# Patient Record
Sex: Female | Born: 1955
Health system: Southern US, Community
[De-identification: ages and names within clinical notes are randomized; demographics above are authoritative.]

## PROBLEM LIST (undated history)

## (undated) DIAGNOSIS — M1990 Unspecified osteoarthritis, unspecified site: Secondary | ICD-10-CM

## (undated) DIAGNOSIS — R490 Dysphonia: Secondary | ICD-10-CM

## (undated) DIAGNOSIS — I10 Essential (primary) hypertension: Secondary | ICD-10-CM

## (undated) DIAGNOSIS — C50919 Malignant neoplasm of unspecified site of unspecified female breast: Secondary | ICD-10-CM

## (undated) DIAGNOSIS — E785 Hyperlipidemia, unspecified: Secondary | ICD-10-CM

## (undated) DIAGNOSIS — T7840XA Allergy, unspecified, initial encounter: Secondary | ICD-10-CM

## (undated) HISTORY — DX: Malignant neoplasm of unspecified site of unspecified female breast: C50.919

## (undated) HISTORY — PX: THROAT SURGERY: SHX803

## (undated) HISTORY — PX: FOOT SURGERY: SHX648

## (undated) HISTORY — DX: Hyperlipidemia, unspecified: E78.5

## (undated) HISTORY — DX: Unspecified osteoarthritis, unspecified site: M19.90

## (undated) HISTORY — DX: Allergy, unspecified, initial encounter: T78.40XA

## (undated) HISTORY — PX: COLONOSCOPY: SHX174

---

## 2001-09-13 ENCOUNTER — Other Ambulatory Visit: Admission: RE | Admit: 2001-09-13 | Discharge: 2001-09-13 | Payer: Self-pay | Admitting: *Deleted

## 2001-09-13 ENCOUNTER — Ambulatory Visit (HOSPITAL_COMMUNITY): Admission: RE | Admit: 2001-09-13 | Discharge: 2001-09-13 | Payer: Self-pay | Admitting: *Deleted

## 2001-09-13 ENCOUNTER — Encounter: Admission: RE | Admit: 2001-09-13 | Discharge: 2001-09-13 | Payer: Self-pay | Admitting: *Deleted

## 2001-09-20 ENCOUNTER — Encounter: Admission: RE | Admit: 2001-09-20 | Discharge: 2001-09-20 | Payer: Self-pay | Admitting: Internal Medicine

## 2001-10-04 ENCOUNTER — Encounter: Admission: RE | Admit: 2001-10-04 | Discharge: 2001-10-04 | Payer: Self-pay | Admitting: Internal Medicine

## 2001-10-11 ENCOUNTER — Ambulatory Visit (HOSPITAL_COMMUNITY): Admission: RE | Admit: 2001-10-11 | Discharge: 2001-10-11 | Payer: Self-pay | Admitting: Internal Medicine

## 2002-06-09 ENCOUNTER — Encounter: Payer: Self-pay | Admitting: Emergency Medicine

## 2002-06-09 ENCOUNTER — Emergency Department (HOSPITAL_COMMUNITY): Admission: EM | Admit: 2002-06-09 | Discharge: 2002-06-09 | Payer: Self-pay | Admitting: Emergency Medicine

## 2002-10-09 ENCOUNTER — Other Ambulatory Visit: Admission: RE | Admit: 2002-10-09 | Discharge: 2002-10-09 | Payer: Self-pay | Admitting: Family Medicine

## 2002-10-14 ENCOUNTER — Ambulatory Visit (HOSPITAL_COMMUNITY): Admission: RE | Admit: 2002-10-14 | Discharge: 2002-10-14 | Payer: Self-pay | Admitting: Family Medicine

## 2002-10-21 ENCOUNTER — Encounter: Admission: RE | Admit: 2002-10-21 | Discharge: 2002-11-18 | Payer: Self-pay | Admitting: Internal Medicine

## 2004-07-02 ENCOUNTER — Emergency Department (HOSPITAL_COMMUNITY): Admission: AD | Admit: 2004-07-02 | Discharge: 2004-07-02 | Payer: Self-pay | Admitting: Family Medicine

## 2004-11-09 ENCOUNTER — Other Ambulatory Visit: Admission: RE | Admit: 2004-11-09 | Discharge: 2004-11-09 | Payer: Self-pay | Admitting: Family Medicine

## 2004-11-09 ENCOUNTER — Ambulatory Visit: Payer: Self-pay | Admitting: Family Medicine

## 2004-11-10 ENCOUNTER — Ambulatory Visit: Payer: Self-pay | Admitting: Family Medicine

## 2004-11-23 ENCOUNTER — Ambulatory Visit (HOSPITAL_COMMUNITY): Admission: RE | Admit: 2004-11-23 | Discharge: 2004-11-23 | Payer: Self-pay | Admitting: Internal Medicine

## 2005-05-25 ENCOUNTER — Emergency Department (HOSPITAL_COMMUNITY): Admission: EM | Admit: 2005-05-25 | Discharge: 2005-05-25 | Payer: Self-pay | Admitting: Family Medicine

## 2005-09-17 ENCOUNTER — Emergency Department (HOSPITAL_COMMUNITY): Admission: EM | Admit: 2005-09-17 | Discharge: 2005-09-17 | Payer: Self-pay | Admitting: Family Medicine

## 2006-03-20 ENCOUNTER — Ambulatory Visit: Payer: Self-pay | Admitting: Internal Medicine

## 2006-06-11 ENCOUNTER — Ambulatory Visit: Payer: Self-pay | Admitting: Family Medicine

## 2006-06-11 ENCOUNTER — Encounter (INDEPENDENT_AMBULATORY_CARE_PROVIDER_SITE_OTHER): Payer: Self-pay | Admitting: Family Medicine

## 2006-06-11 ENCOUNTER — Encounter (INDEPENDENT_AMBULATORY_CARE_PROVIDER_SITE_OTHER): Payer: Self-pay | Admitting: *Deleted

## 2006-06-11 LAB — CONVERTED CEMR LAB

## 2006-11-29 ENCOUNTER — Ambulatory Visit: Payer: Self-pay | Admitting: Internal Medicine

## 2007-02-07 ENCOUNTER — Emergency Department (HOSPITAL_COMMUNITY): Admission: EM | Admit: 2007-02-07 | Discharge: 2007-02-07 | Payer: Self-pay | Admitting: Emergency Medicine

## 2007-03-12 ENCOUNTER — Encounter (INDEPENDENT_AMBULATORY_CARE_PROVIDER_SITE_OTHER): Payer: Self-pay | Admitting: Family Medicine

## 2007-03-12 DIAGNOSIS — I10 Essential (primary) hypertension: Secondary | ICD-10-CM | POA: Insufficient documentation

## 2007-03-12 DIAGNOSIS — F17200 Nicotine dependence, unspecified, uncomplicated: Secondary | ICD-10-CM

## 2007-03-12 DIAGNOSIS — E669 Obesity, unspecified: Secondary | ICD-10-CM

## 2007-03-12 DIAGNOSIS — J301 Allergic rhinitis due to pollen: Secondary | ICD-10-CM

## 2007-03-12 DIAGNOSIS — E7800 Pure hypercholesterolemia, unspecified: Secondary | ICD-10-CM

## 2007-03-26 ENCOUNTER — Emergency Department (HOSPITAL_COMMUNITY): Admission: EM | Admit: 2007-03-26 | Discharge: 2007-03-26 | Payer: Self-pay | Admitting: Emergency Medicine

## 2007-06-24 ENCOUNTER — Ambulatory Visit (HOSPITAL_COMMUNITY): Admission: RE | Admit: 2007-06-24 | Discharge: 2007-06-24 | Payer: Self-pay | Admitting: Family Medicine

## 2007-06-26 ENCOUNTER — Ambulatory Visit: Payer: Self-pay | Admitting: Internal Medicine

## 2008-03-31 ENCOUNTER — Emergency Department (HOSPITAL_COMMUNITY): Admission: EM | Admit: 2008-03-31 | Discharge: 2008-03-31 | Payer: Self-pay | Admitting: Family Medicine

## 2008-09-04 ENCOUNTER — Ambulatory Visit: Payer: Self-pay | Admitting: Internal Medicine

## 2008-09-04 ENCOUNTER — Encounter (INDEPENDENT_AMBULATORY_CARE_PROVIDER_SITE_OTHER): Payer: Self-pay | Admitting: Adult Health

## 2008-09-04 LAB — CONVERTED CEMR LAB
ALT: 15 units/L (ref 0–35)
AST: 17 units/L (ref 0–37)
Albumin: 3.9 g/dL (ref 3.5–5.2)
Alkaline Phosphatase: 51 units/L (ref 39–117)
Basophils Absolute: 0 10*3/uL (ref 0.0–0.1)
Basophils Relative: 0 % (ref 0–1)
Hemoglobin: 13.7 g/dL (ref 12.0–15.0)
LDL Cholesterol: 170 mg/dL — ABNORMAL HIGH (ref 0–99)
Lymphocytes Relative: 26 % (ref 12–46)
MCHC: 33.8 g/dL (ref 30.0–36.0)
Monocytes Absolute: 0.4 10*3/uL (ref 0.1–1.0)
Neutro Abs: 3.7 10*3/uL (ref 1.7–7.7)
Neutrophils Relative %: 65 % (ref 43–77)
Platelets: 193 10*3/uL (ref 150–400)
Potassium: 4.3 meq/L (ref 3.5–5.3)
RDW: 14.4 % (ref 11.5–15.5)
Sodium: 140 meq/L (ref 135–145)
TSH: 1.131 microintl units/mL (ref 0.350–4.500)
Total Bilirubin: 0.2 mg/dL — ABNORMAL LOW (ref 0.3–1.2)
Total Protein: 6.5 g/dL (ref 6.0–8.3)
Triglycerides: 194 mg/dL — ABNORMAL HIGH (ref <150)
VLDL: 39 mg/dL (ref 0–40)

## 2008-09-07 ENCOUNTER — Ambulatory Visit: Payer: Self-pay | Admitting: Internal Medicine

## 2008-09-08 ENCOUNTER — Encounter (INDEPENDENT_AMBULATORY_CARE_PROVIDER_SITE_OTHER): Payer: Self-pay | Admitting: Adult Health

## 2008-09-08 LAB — CONVERTED CEMR LAB: Hgb A1c MFr Bld: 7.3 % — ABNORMAL HIGH (ref 4.6–6.1)

## 2008-09-22 ENCOUNTER — Ambulatory Visit (HOSPITAL_COMMUNITY): Admission: RE | Admit: 2008-09-22 | Discharge: 2008-09-22 | Payer: Self-pay | Admitting: Family Medicine

## 2008-10-26 ENCOUNTER — Encounter (INDEPENDENT_AMBULATORY_CARE_PROVIDER_SITE_OTHER): Payer: Self-pay | Admitting: Adult Health

## 2008-10-26 ENCOUNTER — Ambulatory Visit: Payer: Self-pay | Admitting: Internal Medicine

## 2008-10-26 LAB — CONVERTED CEMR LAB
AST: 13 units/L (ref 0–37)
Albumin: 4 g/dL (ref 3.5–5.2)
Alkaline Phosphatase: 48 units/L (ref 39–117)
BUN: 23 mg/dL (ref 6–23)
Calcium: 9.4 mg/dL (ref 8.4–10.5)
Creatinine, Ser: 1.29 mg/dL — ABNORMAL HIGH (ref 0.40–1.20)
GC Probe Amp, Genital: NEGATIVE
Glucose, Bld: 124 mg/dL — ABNORMAL HIGH (ref 70–99)
HDL: 36 mg/dL — ABNORMAL LOW (ref >39)
Potassium: 4.1 meq/L (ref 3.5–5.3)
Total CHOL/HDL Ratio: 5.3
Triglycerides: 202 mg/dL — ABNORMAL HIGH (ref <150)

## 2008-11-30 ENCOUNTER — Ambulatory Visit: Payer: Self-pay | Admitting: Internal Medicine

## 2008-11-30 ENCOUNTER — Encounter (INDEPENDENT_AMBULATORY_CARE_PROVIDER_SITE_OTHER): Payer: Self-pay | Admitting: Adult Health

## 2008-11-30 LAB — CONVERTED CEMR LAB
ALT: 12 units/L (ref 0–35)
Albumin: 3.9 g/dL (ref 3.5–5.2)
CO2: 24 meq/L (ref 19–32)
Calcium: 8.8 mg/dL (ref 8.4–10.5)
Chloride: 110 meq/L (ref 96–112)
Cholesterol: 167 mg/dL (ref 0–200)
Glucose, Bld: 97 mg/dL (ref 70–99)
Sodium: 145 meq/L (ref 135–145)
Total Protein: 6.4 g/dL (ref 6.0–8.3)
Triglycerides: 139 mg/dL (ref <150)

## 2009-01-26 ENCOUNTER — Encounter (INDEPENDENT_AMBULATORY_CARE_PROVIDER_SITE_OTHER): Payer: Self-pay | Admitting: Internal Medicine

## 2009-01-26 ENCOUNTER — Telehealth (INDEPENDENT_AMBULATORY_CARE_PROVIDER_SITE_OTHER): Payer: Self-pay | Admitting: *Deleted

## 2009-01-26 ENCOUNTER — Ambulatory Visit: Payer: Self-pay | Admitting: Internal Medicine

## 2009-01-26 LAB — CONVERTED CEMR LAB: Helicobacter Pylori Antibody-IgG: >8 — ABNORMAL HIGH

## 2009-02-10 ENCOUNTER — Ambulatory Visit: Payer: Self-pay | Admitting: Internal Medicine

## 2009-05-14 ENCOUNTER — Ambulatory Visit: Payer: Self-pay | Admitting: Internal Medicine

## 2009-08-13 ENCOUNTER — Encounter (INDEPENDENT_AMBULATORY_CARE_PROVIDER_SITE_OTHER): Payer: Self-pay | Admitting: Adult Health

## 2009-08-13 ENCOUNTER — Ambulatory Visit: Payer: Self-pay | Admitting: Internal Medicine

## 2009-08-13 LAB — CONVERTED CEMR LAB
ALT: 13 units/L (ref 0–35)
AST: 18 units/L (ref 0–37)
Albumin: 4 g/dL (ref 3.5–5.2)
Calcium: 9 mg/dL (ref 8.4–10.5)
Chloride: 106 meq/L (ref 96–112)
Creatinine, Ser: 1.12 mg/dL (ref 0.40–1.20)
Potassium: 4.2 meq/L (ref 3.5–5.3)
Sodium: 139 meq/L (ref 135–145)
Total CHOL/HDL Ratio: 7.9
Total Protein: 6.4 g/dL (ref 6.0–8.3)
Vit D, 25-Hydroxy: 11 ng/mL — ABNORMAL LOW (ref 30–89)

## 2009-11-12 ENCOUNTER — Ambulatory Visit: Payer: Self-pay | Admitting: Internal Medicine

## 2009-11-12 ENCOUNTER — Encounter (INDEPENDENT_AMBULATORY_CARE_PROVIDER_SITE_OTHER): Payer: Self-pay | Admitting: Adult Health

## 2009-11-12 ENCOUNTER — Other Ambulatory Visit: Admission: RE | Admit: 2009-11-12 | Discharge: 2009-11-12 | Payer: Self-pay | Admitting: Internal Medicine

## 2009-11-12 LAB — CONVERTED CEMR LAB
ALT: 12 units/L (ref 0–35)
CO2: 24 meq/L (ref 19–32)
Cholesterol: 181 mg/dL (ref 0–200)
LDL Cholesterol: 107 mg/dL — ABNORMAL HIGH (ref 0–99)
Potassium: 4.1 meq/L (ref 3.5–5.3)
Sodium: 142 meq/L (ref 135–145)
Total Bilirubin: 0.3 mg/dL (ref 0.3–1.2)
Total Protein: 6.8 g/dL (ref 6.0–8.3)
VLDL: 38 mg/dL (ref 0–40)
Vit D, 25-Hydroxy: 22 ng/mL — ABNORMAL LOW (ref 30–89)

## 2009-12-15 ENCOUNTER — Ambulatory Visit (HOSPITAL_COMMUNITY): Admission: RE | Admit: 2009-12-15 | Discharge: 2009-12-15 | Payer: Self-pay | Admitting: Internal Medicine

## 2009-12-31 ENCOUNTER — Ambulatory Visit: Payer: Self-pay | Admitting: Obstetrics and Gynecology

## 2009-12-31 ENCOUNTER — Other Ambulatory Visit: Admission: RE | Admit: 2009-12-31 | Discharge: 2009-12-31 | Payer: Self-pay | Admitting: Obstetrics & Gynecology

## 2010-01-21 ENCOUNTER — Ambulatory Visit: Payer: Self-pay | Admitting: Obstetrics and Gynecology

## 2010-05-22 ENCOUNTER — Encounter: Payer: Self-pay | Admitting: Internal Medicine

## 2010-06-14 ENCOUNTER — Encounter (INDEPENDENT_AMBULATORY_CARE_PROVIDER_SITE_OTHER): Payer: Self-pay | Admitting: *Deleted

## 2010-06-14 LAB — CONVERTED CEMR LAB
Albumin: 4.2 g/dL (ref 3.5–5.2)
CO2: 25 meq/L (ref 19–32)
Glucose, Bld: 146 mg/dL — ABNORMAL HIGH (ref 70–99)
MCV: 82.2 fL (ref 78.0–100.0)
Microalb, Ur: 0.5 mg/dL (ref 0.00–1.89)
Potassium: 3.9 meq/L (ref 3.5–5.3)
RBC: 5.16 M/uL — ABNORMAL HIGH (ref 3.87–5.11)
Sodium: 140 meq/L (ref 135–145)
TSH: 0.918 microintl units/mL (ref 0.350–4.500)
Total Protein: 6.9 g/dL (ref 6.0–8.3)
WBC: 6 10*3/uL (ref 4.0–10.5)

## 2010-07-08 ENCOUNTER — Ambulatory Visit (INDEPENDENT_AMBULATORY_CARE_PROVIDER_SITE_OTHER): Payer: Medicaid Other | Admitting: Family Medicine

## 2010-07-08 ENCOUNTER — Other Ambulatory Visit: Payer: Self-pay | Admitting: Family Medicine

## 2010-07-08 DIAGNOSIS — Z0142 Encounter for cervical smear to confirm findings of recent normal smear following initial abnormal smear: Secondary | ICD-10-CM

## 2010-07-22 NOTE — Progress Notes (Signed)
NAME:  Stephanie Neal, BURR              ACCOUNT NO.:  [id number]  MEDICAL RECORD NO.:  [id number]           PATIENT TYPE:  A  LOCATION:  [facility]                   FACILITY:  [facility]  PHYSICIAN:  Tinnie Gens, MD        DATE OF BIRTH:  03-Jan-1956  DATE OF SERVICE:  07/08/2010                                 CLINIC NOTE  CHIEF COMPLAINT:  Followup Pap smear.  HISTORY OF PRESENT ILLNESS:  The patient is a 55 year old gravida 8, para 6-0-2-5 who comes in today for history of ASC-H Pap in September of last year.  She underwent colposcopy and the biopsy showed benign endocervical mucus with squamous metaplasia and benign squamocolumnar mucosa with mild acute cervicitis.  She is for followup Pap today.  She is without specific complaint.  PHYSICAL EXAMINATION:  VITAL SIGNS:  As noted in the chart. GENERAL:  She is a well-developed and well-nourished female in no acute distress. GU:  Normal external female genitalia.  BUS normal.  Vagina is pink and rugated.  Cervix is parous without lesion.  Uterus is small, anteverted. No adnexal mass or tenderness.  IMPRESSION:  Postmenopausal female with history of abnormal Pap, status post colposcopy with benign biopsy results.  PLAN:  Follow up Pap smear today.  We will send results to the patient once they become available.          ______________________________ Tinnie Gens, MD    TP/MEDQ  D:  07/08/2010  T:  07/09/2010  Job:  [id number]

## 2010-11-29 ENCOUNTER — Other Ambulatory Visit (HOSPITAL_COMMUNITY): Payer: Self-pay | Admitting: Family Medicine

## 2010-11-29 DIAGNOSIS — Z1231 Encounter for screening mammogram for malignant neoplasm of breast: Secondary | ICD-10-CM

## 2010-12-19 ENCOUNTER — Ambulatory Visit (HOSPITAL_COMMUNITY): Payer: Medicaid Other

## 2010-12-27 ENCOUNTER — Ambulatory Visit (HOSPITAL_COMMUNITY): Payer: Medicaid Other

## 2011-01-05 ENCOUNTER — Emergency Department (HOSPITAL_COMMUNITY)
Admission: EM | Admit: 2011-01-05 | Discharge: 2011-01-05 | Disposition: A | Discharge status: Home / Self Care | Payer: Medicaid Other | Attending: Emergency Medicine | Admitting: Emergency Medicine

## 2011-01-05 ENCOUNTER — Inpatient Hospital Stay (INDEPENDENT_AMBULATORY_CARE_PROVIDER_SITE_OTHER)
Admission: RE | Admit: 2011-01-05 | Discharge: 2011-01-05 | Disposition: A | Discharge status: Home / Self Care | Payer: Medicaid Other | Source: Ambulatory Visit | Attending: Family Medicine | Admitting: Family Medicine

## 2011-01-05 DIAGNOSIS — I10 Essential (primary) hypertension: Secondary | ICD-10-CM | POA: Insufficient documentation

## 2011-01-05 DIAGNOSIS — H538 Other visual disturbances: Secondary | ICD-10-CM | POA: Insufficient documentation

## 2011-01-05 DIAGNOSIS — R3589 Other polyuria: Secondary | ICD-10-CM | POA: Insufficient documentation

## 2011-01-05 DIAGNOSIS — R631 Polydipsia: Secondary | ICD-10-CM | POA: Insufficient documentation

## 2011-01-05 DIAGNOSIS — R110 Nausea: Secondary | ICD-10-CM | POA: Insufficient documentation

## 2011-01-05 DIAGNOSIS — E119 Type 2 diabetes mellitus without complications: Secondary | ICD-10-CM | POA: Insufficient documentation

## 2011-01-05 DIAGNOSIS — Z79899 Other long term (current) drug therapy: Secondary | ICD-10-CM | POA: Insufficient documentation

## 2011-01-05 DIAGNOSIS — R42 Dizziness and giddiness: Secondary | ICD-10-CM | POA: Insufficient documentation

## 2011-01-05 DIAGNOSIS — R5381 Other malaise: Secondary | ICD-10-CM | POA: Insufficient documentation

## 2011-01-05 DIAGNOSIS — R7989 Other specified abnormal findings of blood chemistry: Secondary | ICD-10-CM

## 2011-01-05 DIAGNOSIS — R358 Other polyuria: Secondary | ICD-10-CM | POA: Insufficient documentation

## 2011-01-05 LAB — POCT I-STAT 3, VENOUS BLOOD GAS (G3P V)
Acid-Base Excess: 1 mmol/L (ref 0.0–2.0)
Bicarbonate: 28 mEq/L — ABNORMAL HIGH (ref 20.0–24.0)
O2 Saturation: 50 %
TCO2: 30 mmol/L (ref 0–100)
pCO2, Ven: 51.3 mmHg — ABNORMAL HIGH (ref 45.0–50.0)
pH, Ven: 7.345 — ABNORMAL HIGH (ref 7.250–7.300)
pO2, Ven: 29 mmHg — CL (ref 30.0–45.0)

## 2011-01-05 LAB — GLUCOSE, CAPILLARY
Glucose-Capillary: 585 mg/dL (ref 70–99)
Glucose-Capillary: 396 mg/dL — ABNORMAL HIGH (ref 70–99)
Glucose-Capillary: 321 mg/dL — ABNORMAL HIGH (ref 70–99)
Glucose-Capillary: 340 mg/dL — ABNORMAL HIGH (ref 70–99)
Glucose-Capillary: 60 mg/dL — ABNORMAL LOW (ref 70–99)
Glucose-Capillary: 144 mg/dL — ABNORMAL HIGH (ref 70–99)

## 2011-01-05 LAB — POCT I-STAT, CHEM 8
BUN: 24 mg/dL — ABNORMAL HIGH (ref 6–23)
Calcium, Ion: 1.2 mmol/L (ref 1.12–1.32)
Calcium, Ion: 1.09 mmol/L — ABNORMAL LOW (ref 1.12–1.32)
Chloride: 94 mEq/L — ABNORMAL LOW (ref 96–112)
Creatinine, Ser: 1.3 mg/dL — ABNORMAL HIGH (ref 0.50–1.10)
Glucose, Bld: >700 mg/dL (ref 70–99)
Glucose, Bld: >700 mg/dL (ref 70–99)
HCT: 45 % (ref 36.0–46.0)
HCT: 44 % (ref 36.0–46.0)
Hemoglobin: 15.3 g/dL — ABNORMAL HIGH (ref 12.0–15.0)
Hemoglobin: 15 g/dL (ref 12.0–15.0)
Potassium: 4.7 meq/L (ref 3.5–5.1)
Sodium: 128 meq/L — ABNORMAL LOW (ref 135–145)
TCO2: 26 mmol/L (ref 0–100)

## 2011-01-05 LAB — POCT URINALYSIS DIP (DEVICE)
Ketones, ur: NEGATIVE mg/dL
Leukocytes, UA: NEGATIVE
Nitrite: NEGATIVE
Protein, ur: NEGATIVE mg/dL
pH: 5 (ref 5.0–8.0)

## 2011-01-05 LAB — CBC
HCT: 39 % (ref 36.0–46.0)
Hemoglobin: 13.9 g/dL (ref 12.0–15.0)
MCH: 27.6 pg (ref 26.0–34.0)
MCV: 77.5 fL — ABNORMAL LOW (ref 78.0–100.0)
Platelets: 195 10*3/uL (ref 150–400)
RBC: 5.03 MIL/uL (ref 3.87–5.11)
WBC: 6 10*3/uL (ref 4.0–10.5)

## 2011-01-05 LAB — URINE MICROSCOPIC-ADD ON

## 2011-01-05 LAB — URINALYSIS, ROUTINE W REFLEX MICROSCOPIC
Glucose, UA: >1000 mg/dL — AB
Leukocytes, UA: NEGATIVE
Nitrite: NEGATIVE
Specific Gravity, Urine: 1.029 (ref 1.005–1.030)
pH: 5.5 (ref 5.0–8.0)

## 2011-01-05 LAB — DIFFERENTIAL
Basophils Absolute: 0 10*3/uL (ref 0.0–0.1)
Basophils Relative: 0 % (ref 0–1)
Eosinophils Absolute: 0.1 10*3/uL (ref 0.0–0.7)
Eosinophils Relative: 2 % (ref 0–5)
Monocytes Absolute: 0.3 10*3/uL (ref 0.1–1.0)

## 2011-01-05 LAB — BASIC METABOLIC PANEL
CO2: 27 mEq/L (ref 19–32)
Calcium: 9.8 mg/dL (ref 8.4–10.5)
Chloride: 90 mEq/L — ABNORMAL LOW (ref 96–112)
Creatinine, Ser: 1.21 mg/dL — ABNORMAL HIGH (ref 0.50–1.10)
Glucose, Bld: 696 mg/dL (ref 70–99)

## 2011-01-06 ENCOUNTER — Ambulatory Visit (HOSPITAL_COMMUNITY): Payer: Medicaid Other | Attending: Family Medicine

## 2011-05-13 ENCOUNTER — Emergency Department (INDEPENDENT_AMBULATORY_CARE_PROVIDER_SITE_OTHER): Payer: Medicaid Other

## 2011-05-13 ENCOUNTER — Emergency Department (INDEPENDENT_AMBULATORY_CARE_PROVIDER_SITE_OTHER)
Admission: EM | Admit: 2011-05-13 | Discharge: 2011-05-13 | Disposition: A | Discharge status: Home / Self Care | Payer: Medicaid Other | Source: Home / Self Care | Attending: Family Medicine | Admitting: Family Medicine

## 2011-05-13 ENCOUNTER — Encounter (HOSPITAL_COMMUNITY): Payer: Self-pay | Admitting: Emergency Medicine

## 2011-05-13 DIAGNOSIS — R109 Unspecified abdominal pain: Secondary | ICD-10-CM

## 2011-05-13 HISTORY — DX: Essential (primary) hypertension: I10

## 2011-05-13 LAB — POCT URINALYSIS DIP (DEVICE)
Bilirubin Urine: NEGATIVE
Glucose, UA: NEGATIVE mg/dL
Nitrite: NEGATIVE
Urobilinogen, UA: 0.2 mg/dL (ref 0.0–1.0)
pH: 6.5 (ref 5.0–8.0)

## 2011-05-13 MED ORDER — HYDROCODONE-ACETAMINOPHEN 5-500 MG PO TABS: 1.0000 | ORAL_TABLET | Freq: Four times a day (QID) | ORAL | Status: AC | PRN

## 2011-05-13 MED ORDER — CIPROFLOXACIN HCL 500 MG PO TABS: 500.0000 mg | ORAL_TABLET | Freq: Two times a day (BID) | ORAL | Status: AC

## 2011-05-13 MED ORDER — TAMSULOSIN HCL 0.4 MG PO CAPS: 0.4000 mg | ORAL_CAPSULE | ORAL | Status: DC

## 2011-05-13 MED ORDER — HYDROCODONE-ACETAMINOPHEN 5-325 MG PO TABS
ORAL_TABLET | ORAL | Status: AC
  Filled 2011-05-13: qty 1

## 2011-05-13 MED ORDER — HYDROCODONE-ACETAMINOPHEN 5-325 MG PO TABS
1.0000 | ORAL_TABLET | Freq: Once | ORAL | Status: AC
  Administered 2011-05-13: 1 via ORAL

## 2011-05-13 NOTE — ED Provider Notes (Signed)
History     CSN: [id number]  Arrival date & time 05/13/11  1732   First MD Initiated Contact with Patient 05/13/11 1844      Chief Complaint  Patient presents with  . Abdominal Pain  . Urinary Tract Infection    (Consider location/radiation/quality/duration/timing/severity/associated sxs/prior treatment) HPI Comments: 56 y/o smoker female with history of diabetes and hypertension here c/o right flank pain for 3 days. Has noted increase urinary frequency but no burning on urination and has not seen blood in her urine. Pain worse with movement denies constipation had normal bowel movement yesterday with no blood. She thinks her pain although no going away is somewhat better. Has been able to keep solids and fluids today and denies, nausea, vomiting or diarrhea. No fever or chills, no vaginal discharge.   Past Medical History  Diagnosis Date  . Hypertension   . Diabetes mellitus     History reviewed. No pertinent past surgical history.  No family history on file.  History  Substance Use Topics  . Smoking status: Current Everyday Smoker  . Smokeless tobacco: Not on file  . Alcohol Use: No    OB History    Grav Para Term Preterm Abortions TAB SAB Ect Mult Living                  Review of Systems  Constitutional: Negative for fever, chills, diaphoresis and appetite change.  HENT: Negative for congestion and sore throat.   Respiratory: Negative for shortness of breath.   Cardiovascular: Negative for chest pain and leg swelling.  Gastrointestinal: Positive for abdominal pain. Negative for nausea, vomiting, diarrhea, constipation, blood in stool and abdominal distention.  Genitourinary: Positive for frequency and flank pain. Negative for dysuria, urgency, vaginal bleeding and vaginal discharge.  Skin: Negative for rash.  Neurological: Negative for dizziness and headaches.    Allergies  Penicillins  Home Medications   Current Outpatient Rx  Name Route Sig Dispense  Refill  . INSULIN GLARGINE 100 UNIT/ML [place] SOLN Subcutaneous Inject 40 Units into the skin at bedtime.    Marland Kitchen LISINOPRIL-HYDROCHLOROTHIAZIDE 10-12.5 MG PO TABS Oral Take 1 tablet by mouth daily.    Marland Kitchen METFORMIN HCL 1000 MG PO TABS Oral Take 1,000 mg by mouth 2 (two) times daily with a meal.    . SIMVASTATIN 40 MG PO TABS Oral Take 40 mg by mouth every evening.    Marland Kitchen CIPROFLOXACIN HCL 500 MG PO TABS Oral Take 1 tablet (500 mg total) by mouth every 12 (twelve) hours. 10 tablet 0  . HYDROCODONE-ACETAMINOPHEN 5-500 MG PO TABS Oral Take 1-2 tablets by mouth every 6 (six) hours as needed for pain. 15 tablet 0  . TAMSULOSIN HCL 0.4 MG PO CAPS Oral Take 1 capsule (0.4 mg total) by mouth 1 day or 1 dose. 7 capsule 0    BP 126/66  Pulse 65  Temp(Src) 98.3 F (36.8 C) (Oral)  Resp 18  SpO2 97%  Physical Exam  Nursing note and vitals reviewed. Constitutional: She is oriented to person, place, and time. She appears well-developed and well-nourished. No distress.       Uncomfortable with movement but able to transfer by self from chair to bed and vice versa.  HENT:  Mouth/Throat: Oropharynx is clear and moist. No oropharyngeal exudate.  Eyes: Conjunctivae and EOM are normal. Pupils are equal, round, and reactive to light. No scleral icterus.  Neck: Neck supple.  Cardiovascular: Normal heart sounds.   Pulmonary/Chest: Breath sounds normal.  Abdominal:  Soft. She exhibits no distension and no mass. There is tenderness. There is no rebound and no guarding.       Right flank and RLQ tenderness. With no signs of peritoneal irritation. No CVT.  Lymphadenopathy:    She has no cervical adenopathy.  Neurological: She is alert and oriented to person, place, and time.  Skin: No rash noted.    ED Course  Procedures (including critical care time)  Labs Reviewed  POCT URINALYSIS DIP (DEVICE) - Abnormal; Notable for the following:    Hgb urine dipstick TRACE (*)    Leukocytes, UA TRACE (*) Biochemical Testing  Only. Please order routine urinalysis from main lab if confirmatory testing is needed.   All other components within normal limits  LAB REPORT - SCANNED  POCT URINALYSIS DIPSTICK   Dg Abd 1 View  05/13/2011  *RADIOLOGY REPORT*  Clinical Data: 56 year old female with right flank pain times 3 days.  ABDOMEN - 1 VIEW  Comparison: None.  Findings: Visualized bowel gas pattern is nonobstructed.  There are numerous calcific densities in the pelvis.  Most of these are phleboliths.  It is difficult to exclude a distal right ureteral calculus.  The right renal contour is fairly well seen and no right nephrolithiasis is identified.  The left renal contour is largely obscured by stool. No acute osseous abnormality identified.  IMPRESSION: Numerous pelvic phleboliths.  Difficult to excluded distal right ureteral calculus by plain radiographs.  No right nephrolithiasis is evident.  Original Report Authenticated By: Harley Hallmark, M.D.     1. Right flank pain       MDM  56 y/o postmenopausal diabetic female with right flank pain and trace Hg and LE in POC urine. Concerning for urolithiasis. Pt. Decline transfer to the ED, stating she has not felt too sick and is feeling better and needs to help her son with something at home. No obvious big stone on X-rays and no clinical symptoms or findings suggesting current urinary obstructive disease or peritonitis. Explained the risks of delaying further diagnostic work up. Treated empirically with flomax, vicodin, cipro and recommendations to return to the emergency department if new symptoms like fever, nausea or vomiting pain. Referral contact for urology outpatient follow up also provided.        Sharin Grave, MD 05/14/11 1142

## 2011-05-13 NOTE — ED Notes (Signed)
PT HEREWITH RIGHT FLANK PAIN X 3 DYS WITH INTERMITT SHARP,ACHY PAINS.PT ALSO C/O FREQ URINATION BUT DENIES PRESSURE.NO VOMITING,FEVER,CHILLS.

## 2012-01-16 ENCOUNTER — Emergency Department (HOSPITAL_COMMUNITY)
Admission: EM | Admit: 2012-01-16 | Discharge: 2012-01-17 | Disposition: A | Discharge status: Home / Self Care | Payer: Medicaid Other | Attending: Emergency Medicine | Admitting: Emergency Medicine

## 2012-01-16 ENCOUNTER — Encounter (HOSPITAL_COMMUNITY): Payer: Self-pay | Admitting: *Deleted

## 2012-01-16 DIAGNOSIS — E119 Type 2 diabetes mellitus without complications: Secondary | ICD-10-CM | POA: Insufficient documentation

## 2012-01-16 DIAGNOSIS — T148XXA Other injury of unspecified body region, initial encounter: Secondary | ICD-10-CM

## 2012-01-16 DIAGNOSIS — Y92009 Unspecified place in unspecified non-institutional (private) residence as the place of occurrence of the external cause: Secondary | ICD-10-CM | POA: Insufficient documentation

## 2012-01-16 DIAGNOSIS — Z794 Long term (current) use of insulin: Secondary | ICD-10-CM | POA: Insufficient documentation

## 2012-01-16 DIAGNOSIS — S1093XA Contusion of unspecified part of neck, initial encounter: Secondary | ICD-10-CM | POA: Insufficient documentation

## 2012-01-16 DIAGNOSIS — S0003XA Contusion of scalp, initial encounter: Secondary | ICD-10-CM | POA: Insufficient documentation

## 2012-01-16 DIAGNOSIS — I10 Essential (primary) hypertension: Secondary | ICD-10-CM | POA: Insufficient documentation

## 2012-01-16 DIAGNOSIS — W1809XA Striking against other object with subsequent fall, initial encounter: Secondary | ICD-10-CM | POA: Insufficient documentation

## 2012-01-16 DIAGNOSIS — W19XXXA Unspecified fall, initial encounter: Secondary | ICD-10-CM
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
## 2012-01-17 ENCOUNTER — Emergency Department (HOSPITAL_COMMUNITY): Payer: Medicaid Other

## 2012-01-17 MED ORDER — IBUPROFEN 800 MG PO TABS
800.0000 mg | ORAL_TABLET | Freq: Once | ORAL | Status: AC
  Administered 2012-01-17: 800 mg via ORAL
  Filled 2012-01-17: qty 1

## 2012-01-17 NOTE — ED Provider Notes (Signed)
Medical screening examination/treatment/procedure(s) were performed by non-physician practitioner and as supervising physician I was immediately available for consultation/collaboration.   Zaina Jenkin, MD 01/17/12 0717 
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
## 2012-02-27 ENCOUNTER — Other Ambulatory Visit (HOSPITAL_COMMUNITY): Payer: Self-pay | Admitting: Internal Medicine

## 2012-02-27 DIAGNOSIS — Z1231 Encounter for screening mammogram for malignant neoplasm of breast: Secondary | ICD-10-CM

## 2012-03-18 ENCOUNTER — Ambulatory Visit (HOSPITAL_COMMUNITY): Payer: Medicaid Other | Attending: Internal Medicine

## 2012-04-16 ENCOUNTER — Ambulatory Visit (HOSPITAL_COMMUNITY): Admission: RE | Admit: 2012-04-16 | Payer: Medicaid Other | Source: Ambulatory Visit

## 2012-08-08 ENCOUNTER — Encounter (HOSPITAL_COMMUNITY): Payer: Self-pay | Admitting: *Deleted

## 2012-08-08 ENCOUNTER — Emergency Department (HOSPITAL_COMMUNITY)
Admission: EM | Admit: 2012-08-08 | Discharge: 2012-08-08 | Disposition: A | Discharge status: Home / Self Care | Payer: Medicaid Other | Source: Home / Self Care | Attending: Emergency Medicine | Admitting: Emergency Medicine

## 2012-08-08 DIAGNOSIS — K30 Functional dyspepsia: Secondary | ICD-10-CM

## 2012-08-08 DIAGNOSIS — R109 Unspecified abdominal pain: Secondary | ICD-10-CM

## 2012-08-08 DIAGNOSIS — K3189 Other diseases of stomach and duodenum: Secondary | ICD-10-CM

## 2012-08-08 LAB — POCT I-STAT, CHEM 8
Calcium, Ion: 1.2 mmol/L (ref 1.12–1.23)
Glucose, Bld: 86 mg/dL (ref 70–99)
HCT: 41 % (ref 36.0–46.0)
Hemoglobin: 13.9 g/dL (ref 12.0–15.0)
Potassium: 4 mEq/L (ref 3.5–5.1)

## 2012-08-08 LAB — POCT URINALYSIS DIP (DEVICE)
Glucose, UA: NEGATIVE mg/dL
Leukocytes, UA: NEGATIVE
Nitrite: NEGATIVE
Protein, ur: NEGATIVE mg/dL
Urobilinogen, UA: 0.2 mg/dL (ref 0.0–1.0)

## 2012-08-08 MED ORDER — TRAMADOL HCL 50 MG PO TABS: 50.0000 mg | ORAL_TABLET | Freq: Four times a day (QID) | ORAL | Status: DC | PRN

## 2012-08-08 MED ORDER — OMEPRAZOLE 20 MG PO CPDR: 20.0000 mg | DELAYED_RELEASE_CAPSULE | Freq: Every day | ORAL | Status: DC

## 2012-08-08 NOTE — ED Notes (Signed)
Pt  Reports  l  Side  Pain  With  Frequent  Urination     Symptoms  X  1  Week     Pt  Also  Reports  Some  Indigestion     Pt   denys  Any   Vomiting  Or  Diarrhea    Appears    In no  Severe  Distress

## 2012-08-08 NOTE — ED Provider Notes (Signed)
History     CSN: [id number]  Arrival date & time 08/08/12  1248   First MD Initiated Contact with Patient 08/08/12 1338      Chief Complaint  Patient presents with  . Flank Pain    (Consider location/radiation/quality/duration/timing/severity/associated sxs/prior treatment) Patient is a 57 y.o. female presenting with flank pain.  Flank Pain This is a new problem. The current episode started more than 1 week ago. The problem occurs constantly. The problem has not changed since onset.Associated symptoms include abdominal pain. The symptoms are aggravated by bending.    Past Medical History  Diagnosis Date  . Hypertension   . Diabetes mellitus     History reviewed. No pertinent past surgical history.  History reviewed. No pertinent family history.  History  Substance Use Topics  . Smoking status: Current Every Day Smoker  . Smokeless tobacco: Not on file  . Alcohol Use: No    OB History   Grav Para Term Preterm Abortions TAB SAB Ect Mult Living                  Review of Systems  Gastrointestinal: Positive for abdominal pain.  Genitourinary: Positive for frequency and flank pain.  All other systems reviewed and are negative.    Allergies  Penicillins  Home Medications   Current Outpatient Rx  Name  Route  Sig  Dispense  Refill  . ibuprofen (ADVIL,MOTRIN) 200 MG tablet   Oral   Take 800 mg by mouth every 6 (six) hours as needed. For pain         . insulin glargine (LANTUS) 100 UNIT/ML injection   Subcutaneous   Inject 40 Units into the skin at bedtime.         Marland Kitchen lisinopril-hydrochlorothiazide (PRINZIDE,ZESTORETIC) 10-12.5 MG per tablet   Oral   Take 1 tablet by mouth daily.         . metFORMIN (GLUCOPHAGE) 1000 MG tablet   Oral   Take 1,000 mg by mouth 2 (two) times daily with a meal.         . omeprazole (PRILOSEC) 20 MG capsule   Oral   Take 1 capsule (20 mg total) by mouth daily.   20 capsule   0     Dispense as written.   .  traMADol (ULTRAM) 50 MG tablet   Oral   Take 1 tablet (50 mg total) by mouth every 6 (six) hours as needed for pain.   15 tablet   0     BP 140/72  Pulse 72  Temp(Src) 98.6 F (37 C) (Oral)  Resp 18  SpO2 100%  Physical Exam  Constitutional: She is oriented to person, place, and time. She appears well-developed and well-nourished.  HENT:  Head: Normocephalic.  Eyes: Pupils are equal, round, and reactive to light.  Cardiovascular: Normal rate and regular rhythm.   Pulmonary/Chest: Effort normal and breath sounds normal.  Abdominal: Soft. There is no tenderness.  Musculoskeletal: Normal range of motion.  Neurological: She is alert and oriented to person, place, and time.  Skin: Skin is warm and dry.    ED Course  Procedures (including critical care time)  Labs Reviewed  POCT I-STAT, CHEM 8  POCT URINALYSIS DIP (DEVICE)   No results found.   1. Right flank pain   2. Functional dyspepsia       MDM          Jani Files, MD 08/28/12 [phone]

## 2012-09-04 ENCOUNTER — Ambulatory Visit
Admission: RE | Admit: 2012-09-04 | Discharge: 2012-09-04 | Disposition: A | Discharge status: Home / Self Care | Payer: Medicaid Other | Source: Ambulatory Visit | Attending: Internal Medicine | Admitting: Internal Medicine

## 2012-09-04 DIAGNOSIS — Z1231 Encounter for screening mammogram for malignant neoplasm of breast: Secondary | ICD-10-CM

## 2013-09-01 ENCOUNTER — Encounter: Payer: Self-pay | Admitting: Internal Medicine

## 2013-09-01 ENCOUNTER — Ambulatory Visit: Payer: Medicaid Other | Attending: Internal Medicine | Admitting: Internal Medicine

## 2013-09-01 VITALS — BP 121/74 | HR 91 | Temp 98.4°F | Resp 12 | Ht 70.5 in | Wt 193.6 lb

## 2013-09-01 DIAGNOSIS — E785 Hyperlipidemia, unspecified: Secondary | ICD-10-CM | POA: Insufficient documentation

## 2013-09-01 DIAGNOSIS — F17200 Nicotine dependence, unspecified, uncomplicated: Secondary | ICD-10-CM

## 2013-09-01 DIAGNOSIS — E119 Type 2 diabetes mellitus without complications: Secondary | ICD-10-CM | POA: Diagnosis not present

## 2013-09-01 DIAGNOSIS — Z79899 Other long term (current) drug therapy: Secondary | ICD-10-CM | POA: Insufficient documentation

## 2013-09-01 DIAGNOSIS — I10 Essential (primary) hypertension: Secondary | ICD-10-CM

## 2013-09-01 DIAGNOSIS — G609 Hereditary and idiopathic neuropathy, unspecified: Secondary | ICD-10-CM | POA: Insufficient documentation

## 2013-09-01 LAB — POCT GLYCOSYLATED HEMOGLOBIN (HGB A1C): HEMOGLOBIN A1C: 6.8

## 2013-09-01 LAB — GLUCOSE, POCT (MANUAL RESULT ENTRY): POC Glucose: 155 mg/dl — AB (ref 70–99)

## 2013-09-01 NOTE — Patient Instructions (Addendum)
Diabetes and Foot Care Diabetes may cause you to have problems because of poor blood supply (circulation) to your feet and legs. This may cause the skin on your feet to become thinner, break easier, and heal more slowly. Your skin may become dry, and the skin may peel and crack. You may also have nerve damage in your legs and feet causing decreased feeling in them. You may not notice minor injuries to your feet that could lead to infections or more serious problems. Taking care of your feet is one of the most important things you can do for yourself.  HOME CARE INSTRUCTIONS  Wear shoes at all times, even in the house. Do not go barefoot. Bare feet are easily injured.  Check your feet daily for blisters, cuts, and redness. If you cannot see the bottom of your feet, use a mirror or ask someone for help.  Wash your feet with warm water (do not use hot water) and mild soap. Then pat your feet and the areas between your toes until they are completely dry. Do not soak your feet as this can dry your skin.  Apply a moisturizing lotion or petroleum jelly (that does not contain alcohol and is unscented) to the skin on your feet and to dry, brittle toenails. Do not apply lotion between your toes.  Trim your toenails straight across. Do not dig under them or around the cuticle. File the edges of your nails with an emery board or nail file.  Do not cut corns or calluses or try to remove them with medicine.  Wear clean socks or stockings every day. Make sure they are not too tight. Do not wear knee-high stockings since they may decrease blood flow to your legs.  Wear shoes that fit properly and have enough cushioning. To break in new shoes, wear them for just a few hours a day. This prevents you from injuring your feet. Always look in your shoes before you put them on to be sure there are no objects inside.  Do not cross your legs. This may decrease the blood flow to your feet.  If you find a minor scrape,  cut, or break in the skin on your feet, keep it and the skin around it clean and dry. These areas may be cleansed with mild soap and water. Do not cleanse the area with peroxide, alcohol, or iodine.  When you remove an adhesive bandage, be sure not to damage the skin around it.  If you have a wound, look at it several times a day to make sure it is healing.  Do not use heating pads or hot water bottles. They may burn your skin. If you have lost feeling in your feet or legs, you may not know it is happening until it is too late.  Make sure your health care provider performs a complete foot exam at least annually or more often if you have foot problems. Report any cuts, sores, or bruises to your health care provider immediately. SEEK MEDICAL CARE IF:   You have an injury that is not healing.  You have cuts or breaks in the skin.  You have an ingrown nail.  You notice redness on your legs or feet.  You feel burning or tingling in your legs or feet.  You have pain or cramps in your legs and feet.  Your legs or feet are numb.  Your feet always feel cold. SEEK IMMEDIATE MEDICAL CARE IF:   There is increasing redness,   swelling, or pain in or around a wound.  There is a red line that goes up your leg.  Pus is coming from a wound.  You develop a fever or as directed by your health care provider.  You notice a bad smell coming from an ulcer or wound. Document Released: 04/14/2000 Document Revised: 12/18/2012 Document Reviewed: 09/24/2012 [facility] Patient Information 2014 [place], [place]. DASH Diet The DASH diet stands for "Dietary Approaches to Stop Hypertension." It is a healthy eating plan that has been shown to reduce high blood pressure (hypertension) in as little as 14 days, while also possibly providing other significant health benefits. These other health benefits include reducing the risk of breast cancer after menopause and reducing the risk of type 2 diabetes, heart disease,  colon cancer, and stroke. Health benefits also include weight loss and slowing kidney failure in patients with chronic kidney disease.  DIET GUIDELINES  Limit salt (sodium). Your diet should contain less than 1500 mg of sodium daily.  Limit refined or processed carbohydrates. Your diet should include mostly whole grains. Desserts and added sugars should be used sparingly.  Include small amounts of heart-healthy fats. These types of fats include nuts, oils, and tub margarine. Limit saturated and trans fats. These fats have been shown to be harmful in the body. CHOOSING FOODS  The following food groups are based on a 2000 calorie diet. See your Registered Dietitian for individual calorie needs. Grains and Grain Products (6 to 8 servings daily)  Eat More Often: Whole-wheat bread, brown rice, whole-grain or wheat pasta, quinoa, popcorn without added fat or salt (air popped).  Eat Less Often: White bread, white pasta, white rice, cornbread. Vegetables (4 to 5 servings daily)  Eat More Often: Fresh, frozen, and canned vegetables. Vegetables may be raw, steamed, roasted, or grilled with a minimal amount of fat.  Eat Less Often/Avoid: Creamed or fried vegetables. Vegetables in a cheese sauce. Fruit (4 to 5 servings daily)  Eat More Often: All fresh, canned (in natural juice), or frozen fruits. Dried fruits without added sugar. One hundred percent fruit juice ( cup [237 mL] daily).  Eat Less Often: Dried fruits with added sugar. Canned fruit in light or heavy syrup. Foot LockerLean Meats, Fish, and Poultry (2 servings or less daily. One serving is 3 to 4 oz [85-114 g]).  Eat More Often: Ninety percent or leaner ground beef, tenderloin, sirloin. Round cuts of beef, chicken breast, Mal[place]urkey breast. All fish. Grill, bake, or broil your meat. Nothing should be fried.  Eat Less Often/Avoid: Fatty cuts of meat, [place]urkey, or chicken leg, thigh, or wing. Fried cuts of meat or fish. Dairy (2 to 3 servings)  Eat More  Often: Low-fat or fat-free milk, low-fat plain or light yogurt, reduced-fat or part-skim cheese.  Eat Less Often/Avoid: Milk (whole, 2%).Whole milk yogurt. Full-fat cheeses. Nuts, Seeds, and Legumes (4 to 5 servings per week)  Eat More Often: All without added salt.  Eat Less Often/Avoid: Salted nuts and seeds, canned beans with added salt. Fats and Sweets (limited)  Eat More Often: Vegetable oils, tub margarines without trans fats, sugar-free gelatin. Mayonnaise and salad dressings.  Eat Less Often/Avoid: Coconut oils, palm oils, butter, stick margarine, cream, half and half, cookies, candy, pie. FOR MORE INFORMATION The Dash Diet Eating Plan: www.dashdiet.org Document Released: 04/06/2011 Document Revised: 07/10/2011 Document Reviewed: 04/06/2011 [facility] Patient Information 2014 [place], [place]. Smoking Cessation Quitting smoking is important to your health and has many advantages. However, it is not always easy to quit  since nicotine is a very addictive drug. Often times, people try 3 times or more before being able to quit. This document explains the best ways for you to prepare to quit smoking. Quitting takes hard work and a lot of effort, but you can do it. ADVANTAGES OF QUITTING SMOKING  You will live longer, feel better, and live better.  Your body will feel the impact of quitting smoking almost immediately.  Within 20 minutes, blood pressure decreases. Your pulse returns to its normal level.  After 8 hours, carbon monoxide levels in the blood return to normal. Your oxygen level increases.  After 24 hours, the chance of having a heart attack starts to decrease. Your breath, hair, and body stop smelling like smoke.  After 48 hours, damaged nerve endings begin to recover. Your sense of taste and smell improve.  After 72 hours, the body is virtually free of nicotine. Your bronchial tubes relax and breathing becomes easier.  After 2 to 12 weeks, lungs can hold more air.  Exercise becomes easier and circulation improves.  The risk of having a heart attack, stroke, cancer, or lung disease is greatly reduced.  After 1 year, the risk of coronary heart disease is cut in half.  After 5 years, the risk of stroke falls to the same as a nonsmoker.  After 10 years, the risk of lung cancer is cut in half and the risk of other cancers decreases significantly.  After 15 years, the risk of coronary heart disease drops, usually to the level of a nonsmoker.  If you are pregnant, quitting smoking will improve your chances of having a healthy baby.  The people you live with, especially any children, will be healthier.  You will have extra money to spend on things other than cigarettes. QUESTIONS TO THINK ABOUT BEFORE ATTEMPTING TO QUIT You may want to talk about your answers with your caregiver.  Why do you want to quit?  If you tried to quit in the past, what helped and what did not?  What will be the most difficult situations for you after you quit? How will you plan to handle them?  Who can help you through the tough times? Your family? Friends? A caregiver?  What pleasures do you get from smoking? What ways can you still get pleasure if you quit? Here are some questions to ask your caregiver:  How can you help me to be successful at quitting?  What medicine do you think would be best for me and how should I take it?  What should I do if I need more help?  What is smoking withdrawal like? How can I get information on withdrawal? GET READY  Set a quit date.  Change your environment by getting rid of all cigarettes, ashtrays, matches, and lighters in your home, car, or work. Do not let people smoke in your home.  Review your past attempts to quit. Think about what worked and what did not. GET SUPPORT AND ENCOURAGEMENT You have a better chance of being successful if you have help. You can get support in many ways.  Tell your family, friends, and  co-workers that you are going to quit and need their support. Ask them not to smoke around you.  Get individual, group, or telephone counseling and support. Programs are available at [facility]. Call your local health department for information about programs in your area.  Spiritual beliefs and practices may help some smokers quit.  Download a "quit  meter" on your computer to keep track of quit statistics, such as how long you have gone without smoking, cigarettes not smoked, and money saved.  Get a self-help book about quitting smoking and staying off of tobacco. LEARN NEW SKILLS AND BEHAVIORS  Distract yourself from urges to smoke. Talk to someone, go for a walk, or occupy your time with a task.  Change your normal routine. Take a different route to work. Drink tea instead of coffee. Eat breakfast in a different place.  Reduce your stress. Take a hot bath, exercise, or read a book.  Plan something enjoyable to do every day. Reward yourself for not smoking.  Explore interactive web-based programs that specialize in helping you quit. GET MEDICINE AND USE IT CORRECTLY Medicines can help you stop smoking and decrease the urge to smoke. Combining medicine with the above behavioral methods and support can greatly increase your chances of successfully quitting smoking.  Nicotine replacement therapy helps deliver nicotine to your body without the negative effects and risks of smoking. Nicotine replacement therapy includes nicotine gum, lozenges, inhalers, nasal sprays, and skin patches. Some may be available over-the-counter and others require a prescription.  Antidepressant medicine helps people abstain from smoking, but how this works is unknown. This medicine is available by prescription.  Nicotinic receptor partial agonist medicine simulates the effect of nicotine in your brain. This medicine is available by prescription. Ask your caregiver for advice about which  medicines to use and how to use them based on your health history. Your caregiver will tell you what side effects to look out for if you choose to be on a medicine or therapy. Carefully read the information on the package. Do not use any other product containing nicotine while using a nicotine replacement product.  RELAPSE OR DIFFICULT SITUATIONS Most relapses occur within the first 3 months after quitting. Do not be discouraged if you start smoking again. Remember, most people try several times before finally quitting. You may have symptoms of withdrawal because your body is used to nicotine. You may crave cigarettes, be irritable, feel very hungry, cough often, get headaches, or have difficulty concentrating. The withdrawal symptoms are only temporary. They are strongest when you first quit, but they will go away within 10 14 days. To reduce the chances of relapse, try to:  Avoid drinking alcohol. Drinking lowers your chances of successfully quitting.  Reduce the amount of caffeine you consume. Once you quit smoking, the amount of caffeine in your body increases and can give you symptoms, such as a rapid heartbeat, sweating, and anxiety.  Avoid smokers because they can make you want to smoke.  Do not let weight gain distract you. Many smokers will gain weight when they quit, usually less than 10 pounds. Eat a healthy diet and stay active. You can always lose the weight gained after you quit.  Find ways to improve your mood other than smoking. FOR MORE INFORMATION  www.smokefree.gov  Document Released: 04/11/2001 Document Revised: 10/17/2011 Document Reviewed: 07/27/2011 [facility] Patient Information 2014 [place], [place].

## 2013-09-01 NOTE — Progress Notes (Signed)
Patient ID: Stephanie Neal, female   DOB: 02/06/1956, 58 y.o.   MRN: [id number] HYPERTENSION Home BP readings: usually 120/80 Chest Pain: none Lightheadedness or Syncope: no Leg Swelling: no  DIABETES Home BS readings: has monitor but does not know how to use machine Hypoglycemia < 60:  n/a Foot problems: neuropathy in bilateral lower extremities   Vision problems: none  HYPERLIPIDEMIA Chest Pain: none  Muscle Aches (severe): none RUQ abdomen pain:  nono  Medications When took last medication:  This morning Misses taking medications:  no  Diet Ability to limit unhealthy foods:  great  Exercise Frequency: 3 times per week Type: walking  Monitoring Labs and Parameters Last A1C:  Lab Results  Component Value Date   HGBA1C 7.3* 09/08/2008    Last Lipid:     Component Value Date/Time   CHOL 181 11/12/2009 2308   HDL 36* 11/12/2009 2308    Last Bmet  Potassium  Date Value Ref Range Status  08/08/2012 4.0  3.5 - 5.1 mEq/L Final     Sodium  Date Value Ref Range Status  08/08/2012 142  135 - 145 mEq/L Final     Creatinine, Ser  Date Value Ref Range Status  08/08/2012 1.10  0.50 - 1.10 mg/dL Final      Last BPs:  BP Readings from Last 3 Encounters:  09/01/13 121/74  08/08/12 140/72  01/17/12 111/50    Weight history:  Wt Readings from Last 3 Encounters:  09/01/13 193 lb 9.6 oz (87.816 kg)   PMH - Smoking status noted.   Review of Systems  Constitutional: Negative for fever, chills and malaise/fatigue.  HENT: Negative for ear pain.   Eyes: Negative for blurred vision and double vision.  Respiratory: Negative for cough and shortness of breath.   Cardiovascular: Negative for chest pain, palpitations, claudication and leg swelling.  Gastrointestinal: Negative for heartburn and abdominal pain.  Genitourinary: Negative for dysuria, urgency, frequency and hematuria.  Musculoskeletal: Positive for back pain. Negative for myalgias.  Skin: Negative for rash.   Neurological: Positive for tingling (bilateral lower extremities). Negative for headaches.  Endo/Heme/Allergies: Does not bruise/bleed easily.  Psychiatric/Behavioral: Negative for suicidal ideas. Depression: son passed away 1 year ago.   Physical Exam  Vitals reviewed. Constitutional: She is oriented to person, place, and time. She appears well-developed and well-nourished.  HENT:  Head: Normocephalic.  Eyes: Conjunctivae and EOM are normal. Pupils are equal, round, and reactive to light.  Neck: Normal range of motion. Neck supple. No JVD present. No thyromegaly present.  Cardiovascular: Normal rate, regular rhythm and normal heart sounds.   Pulmonary/Chest: Effort normal and breath sounds normal.  Abdominal: Soft. Bowel sounds are normal. She exhibits no distension. There is no tenderness.  Lymphadenopathy:    She has no cervical adenopathy.  Neurological: She is alert and oriented to person, place, and time. She has normal reflexes.  Skin: Skin is warm and dry.  Psychiatric: She has a normal mood and affect. Her behavior is normal. Thought content normal.   Aloura was seen today for establish care, hypertension and diabetes.  Diagnoses and associated orders for this visit:  Diabetes mellitus Continue current medication regimen Referred to diabetes education and nutrition classes - Glucose (CBG) - HgB A1c - CBC; Future - COMPLETE METABOLIC PANEL WITH GFR; Future - TSH; Future - Lipid panel; Future  HTN (hypertension) Continue current medication regimen - Lipid panel; Future  Smoking Smoking cessation discussed, printed info given Unspecified hereditary and idiopathic peripheral neuropathy  Return in about 3 months (around 12/02/2013) for DM/HTN, 1 week-Lab Visit.  Holland CommonsValerie Adalai Perl, NP 09/01/2013 10:27 AM

## 2013-09-01 NOTE — Progress Notes (Signed)
Patient here to establish care closer to home. States history of HTN, Type 2 DM, Hyperlipidemia. States 1 month history of intermittent pain LLQ. None at present. Patient's grown son died March 2014. Patient is grieving. PHQ-9 score of 7. Has appt with grief counselor later today.

## 2013-09-06 ENCOUNTER — Emergency Department (HOSPITAL_COMMUNITY)
Admission: EM | Admit: 2013-09-06 | Discharge: 2013-09-06 | Disposition: A | Discharge status: Home / Self Care | Payer: Medicaid Other | Source: Home / Self Care | Attending: Emergency Medicine | Admitting: Emergency Medicine

## 2013-09-06 ENCOUNTER — Encounter (HOSPITAL_COMMUNITY): Payer: Self-pay | Admitting: Emergency Medicine

## 2013-09-06 DIAGNOSIS — K5900 Constipation, unspecified: Secondary | ICD-10-CM

## 2013-09-06 LAB — POCT URINALYSIS DIP (DEVICE)
Bilirubin Urine: NEGATIVE
Glucose, UA: NEGATIVE mg/dL
HGB URINE DIPSTICK: NEGATIVE
Ketones, ur: NEGATIVE mg/dL
Leukocytes, UA: NEGATIVE
Nitrite: NEGATIVE
PROTEIN: NEGATIVE mg/dL
SPECIFIC GRAVITY, URINE: 1.01 (ref 1.005–1.030)
UROBILINOGEN UA: 0.2 mg/dL (ref 0.0–1.0)
pH: 5.5 (ref 5.0–8.0)

## 2013-09-06 NOTE — Discharge Instructions (Signed)
Purchase Miralax and take one dose daily for the next 2-3 weeks until you are having a regular bowel movement daily.  Make sure you drink LOTS of water every day. If this doesn't fix the problem, see your doctor or return here.    Constipation, Adult Constipation is when a person:  Poops (bowel movement) less than 3 times a week.  Has a hard time pooping.  Has poop that is dry, hard, or bigger than normal. HOME CARE   Eat more fiber, such as fruits, vegetables, whole grains like brown rice, and beans.  Eat less fatty foods and sugar. This includes JamaicaFrench fries, hamburgers, cookies, candy, and soda.  If you are not getting enough fiber from food, take products with added fiber in them (supplements).  Drink enough fluid to keep your pee (urine) clear or pale yellow.  Go to the restroom when you feel like you need to poop. Do not hold it.  Only take medicine as told by your doctor. Do not take medicines that help you poop (laxatives) without talking to your doctor first.  Exercise on a regular basis, or as told by your doctor. GET HELP RIGHT AWAY IF:   You have bright red blood in your poop (stool).  Your constipation lasts more than 4 days or gets worse.  You have belly (abdomen) or butt (rectal) pain.  You have thin poop (as thin as a pencil).  You lose weight, and it cannot be explained. MAKE SURE YOU:   Understand these instructions.  Will watch your condition.  Will get help right away if you are not doing well or get worse. Document Released: 10/04/2007 Document Revised: 07/10/2011 Document Reviewed: 01/27/2013 [facility] Patient Information 2014 [place]ExitCare, [place]LL[place].

## 2013-09-06 NOTE — ED Provider Notes (Signed)
Medical screening examination/treatment/procedure(s) were performed by non-physician practitioner and as supervising physician I was immediately available for consultation/collaboration.  Leslee Homeavid Dequarius Jeffries, M.D.  Reuben Likesavid C Sasha Rueth, MD 09/06/13 [phone]

## 2013-09-06 NOTE — ED Provider Notes (Signed)
CSN: [id number]     Arrival date & time 09/06/13  1322 History   First MD Initiated Contact with Patient 09/06/13 1428     Chief Complaint  Patient presents with  . Flank Pain   (Consider location/radiation/quality/duration/timing/severity/associated sxs/prior Treatment) HPI Comments: Pt also c/o occasional L sided abd pain that is relieved when she has a bowel movement  Patient is a 58 y.o. female presenting with constipation. The history is provided by the patient.  Constipation Severity:  Moderate Time since last bowel movement:  2 days Timing:  Intermittent Progression:  Unchanged Chronicity:  New Context: stress   Stool description:  Formed Unusual stool frequency:  For last 2 weeks only goes about twice per week; usually goes daily Relieved by:  None tried Worsened by:  Nothing tried Ineffective treatments:  None tried Associated symptoms: abdominal pain and nausea   Associated symptoms: no diarrhea, no dysuria, no fever and no vomiting     Past Medical History  Diagnosis Date  . Hypertension   . Diabetes mellitus   . Hyperlipidemia    History reviewed. No pertinent past surgical history. Family History  Problem Relation Age of Onset  . Hypertension Mother   . Diabetes Mother   . COPD Father   . Heart disease Father   . Diabetes Father    History  Substance Use Topics  . Smoking status: Current Every Day Smoker  . Smokeless tobacco: Not on file  . Alcohol Use: No   OB History   Grav Para Term Preterm Abortions TAB SAB Ect Mult Living                 Review of Systems  Constitutional: Negative for fever and chills.  Gastrointestinal: Positive for nausea, abdominal pain and constipation. Negative for vomiting, diarrhea and blood in stool.  Genitourinary: Positive for frequency. Negative for dysuria and flank pain.    Allergies  Penicillins  Home Medications   Prior to Admission medications   Medication Sig Start Date End Date Taking? Authorizing  Provider  ibuprofen (ADVIL,MOTRIN) 200 MG tablet Take 800 mg by mouth every 6 (six) hours as needed. For pain    Historical Provider, MD  insulin glargine (LANTUS) 100 UNIT/ML injection Inject 40 Units into the skin at bedtime.    Historical Provider, MD  lisinopril-hydrochlorothiazide (PRINZIDE,ZESTORETIC) 10-12.5 MG per tablet Take 1 tablet by mouth daily.    Historical Provider, MD  metFORMIN (GLUCOPHAGE) 1000 MG tablet Take 1,000 mg by mouth 2 (two) times daily with a meal.    Historical Provider, MD  omeprazole (PRILOSEC) 20 MG capsule Take 1 capsule (20 mg total) by mouth daily. 08/08/12   Duwayne Heckeuben M de [place], MD  simvastatin (ZOCOR) 40 MG tablet Take 40 mg by mouth daily.    Historical Provider, MD  traMADol (ULTRAM) 50 MG tablet Take 1 tablet (50 mg total) by mouth every 6 (six) hours as needed for pain. 08/08/12   Duwayne Heckeuben M de [place], MD   BP 129/75  Pulse 71  Temp(Src) 98 F (36.7 C) (Oral)  Resp 22  SpO2 95% Physical Exam  Constitutional: She appears well-developed and well-nourished. She does not appear ill. No distress.  Pulmonary/Chest: Effort normal.  Abdominal: Soft. Bowel sounds are normal. She exhibits no distension. There is tenderness in the left lower quadrant. There is no rigidity, no rebound and no guarding.  Can palpate colon on L side; this is area of pt's intermittent abd pain    ED  Course  Procedures (including critical care time) Labs Review Labs Reviewed  POCT URINALYSIS DIP (DEVICE)    Imaging Review No results found.   MDM   1. Constipation   Pt to use miralax one dose daily until having daily bowel movements.     Cathlyn ParsonsAngela M Able Malloy, NP 09/06/13 1434

## 2013-09-06 NOTE — ED Notes (Signed)
C/o left side pain which started about two weeks ago States area feels sore No treatments tried States she has been urinating more frequently now

## 2013-09-08 ENCOUNTER — Ambulatory Visit: Payer: Medicaid Other | Attending: Internal Medicine

## 2013-09-08 DIAGNOSIS — E119 Type 2 diabetes mellitus without complications: Secondary | ICD-10-CM

## 2013-09-08 DIAGNOSIS — I10 Essential (primary) hypertension: Secondary | ICD-10-CM

## 2013-09-08 LAB — CBC
HCT: 38.4 % (ref 36.0–46.0)
Hemoglobin: 13 g/dL (ref 12.0–15.0)
MCH: 27.8 pg (ref 26.0–34.0)
MCHC: 33.9 g/dL (ref 30.0–36.0)
MCV: 82.1 fL (ref 78.0–100.0)
PLATELETS: 184 10*3/uL (ref 150–400)
RBC: 4.68 MIL/uL (ref 3.87–5.11)
RDW: 14.2 % (ref 11.5–15.5)
WBC: 5.4 10*3/uL (ref 4.0–10.5)

## 2013-09-08 LAB — COMPLETE METABOLIC PANEL WITH GFR
ALK PHOS: 45 U/L (ref 39–117)
ALT: 8 U/L (ref 0–35)
AST: 11 U/L (ref 0–37)
Albumin: 3.7 g/dL (ref 3.5–5.2)
BILIRUBIN TOTAL: 0.3 mg/dL (ref 0.2–1.2)
BUN: 14 mg/dL (ref 6–23)
CO2: 28 mEq/L (ref 19–32)
CREATININE: 0.87 mg/dL (ref 0.50–1.10)
Calcium: 9.1 mg/dL (ref 8.4–10.5)
Chloride: 104 mEq/L (ref 96–112)
GFR, Est African American: 84 mL/min
GFR, Est Non African American: 73 mL/min
Glucose, Bld: 187 mg/dL — ABNORMAL HIGH (ref 70–99)
Potassium: 4.2 mEq/L (ref 3.5–5.3)
Sodium: 140 mEq/L (ref 135–145)
Total Protein: 5.9 g/dL — ABNORMAL LOW (ref 6.0–8.3)

## 2013-09-08 LAB — LIPID PANEL
CHOL/HDL RATIO: 6.9 ratio
CHOLESTEROL: 247 mg/dL — AB (ref 0–200)
HDL: 36 mg/dL — ABNORMAL LOW (ref >39)
LDL Cholesterol: 178 mg/dL — ABNORMAL HIGH (ref 0–99)
TRIGLYCERIDES: 164 mg/dL — AB (ref <150)
VLDL: 33 mg/dL (ref 0–40)

## 2013-09-09 LAB — TSH: TSH: 1.267 u[IU]/mL (ref 0.350–4.500)

## 2013-09-10 ENCOUNTER — Telehealth: Payer: Self-pay

## 2013-09-10 NOTE — Telephone Encounter (Signed)
Placed call to patient to inform of lab results.  No answer, no voicemail available for message to be left. Patient will need to be called at a later time.

## 2013-09-15 LAB — HM COLONOSCOPY: HM Colonoscopy: NORMAL

## 2013-10-15 ENCOUNTER — Encounter: Payer: Self-pay | Admitting: Emergency Medicine

## 2013-10-15 NOTE — Patient Instructions (Addendum)
Place skin tag removal patient instructions here.   Pt instructed to keep watch on skin tag. If she develop pain or cosmetic problem call clinic for General surgeon to remove  Pt given d/c information of skin tags/treatment

## 2013-10-15 NOTE — Progress Notes (Signed)
Patient ID: Stephanie Neal, female   DOB: 02/10/1956, 58 y.o.   MRN: [id number] PT COMES IN WITH C/O POSSIBLE TICK AFTER HUSBAND EXPOSED TO MANY LAST WEEK. STATES SHE NOTICED BUMP TO RIGHT VAGINAL AREA. DENIES PAIN OR FEVER,CHILLS PT PLACED IN GOWN FOR EXAM

## 2013-10-29 ENCOUNTER — Other Ambulatory Visit: Payer: Self-pay | Admitting: Internal Medicine

## 2013-10-29 DIAGNOSIS — Z1231 Encounter for screening mammogram for malignant neoplasm of breast: Secondary | ICD-10-CM

## 2013-10-30 ENCOUNTER — Ambulatory Visit: Payer: Medicaid Other | Admitting: *Deleted

## 2013-10-30 ENCOUNTER — Ambulatory Visit (HOSPITAL_COMMUNITY)
Admission: RE | Admit: 2013-10-30 | Discharge: 2013-10-30 | Disposition: A | Discharge status: Home / Self Care | Payer: Medicaid Other | Source: Ambulatory Visit | Attending: Internal Medicine | Admitting: Internal Medicine

## 2013-10-30 DIAGNOSIS — Z1231 Encounter for screening mammogram for malignant neoplasm of breast: Secondary | ICD-10-CM | POA: Insufficient documentation

## 2013-11-04 ENCOUNTER — Telehealth: Payer: Self-pay | Admitting: *Deleted

## 2013-11-04 NOTE — Telephone Encounter (Signed)
Patient notified of test result and instruction.

## 2013-11-04 NOTE — Telephone Encounter (Signed)
Message copied by Fredderick SeveranceUCATTE, Janvi Ammar L on Tue Nov 04, 2013  3:32 PM ------      Message from: Holland CommonsKECK, VALERIE A      Created: Tue Nov 04, 2013 10:46 AM       Mammography is normal. Repeat in one year. Thanks ------

## 2013-11-13 ENCOUNTER — Ambulatory Visit: Payer: Medicaid Other | Admitting: *Deleted

## 2013-11-29 ENCOUNTER — Other Ambulatory Visit: Payer: Self-pay | Admitting: Internal Medicine

## 2013-12-03 ENCOUNTER — Encounter: Payer: Self-pay | Admitting: Internal Medicine

## 2013-12-03 ENCOUNTER — Ambulatory Visit: Payer: Medicaid Other | Attending: Internal Medicine | Admitting: Internal Medicine

## 2013-12-03 VITALS — BP 119/71 | HR 79 | Temp 97.9°F | Resp 14 | Ht 71.0 in | Wt 199.2 lb

## 2013-12-03 DIAGNOSIS — E119 Type 2 diabetes mellitus without complications: Secondary | ICD-10-CM | POA: Insufficient documentation

## 2013-12-03 DIAGNOSIS — I10 Essential (primary) hypertension: Secondary | ICD-10-CM | POA: Insufficient documentation

## 2013-12-03 DIAGNOSIS — Z8249 Family history of ischemic heart disease and other diseases of the circulatory system: Secondary | ICD-10-CM | POA: Insufficient documentation

## 2013-12-03 DIAGNOSIS — F17200 Nicotine dependence, unspecified, uncomplicated: Secondary | ICD-10-CM | POA: Insufficient documentation

## 2013-12-03 DIAGNOSIS — Z794 Long term (current) use of insulin: Secondary | ICD-10-CM | POA: Diagnosis not present

## 2013-12-03 DIAGNOSIS — Z880 Allergy status to penicillin: Secondary | ICD-10-CM | POA: Diagnosis not present

## 2013-12-03 DIAGNOSIS — E785 Hyperlipidemia, unspecified: Secondary | ICD-10-CM | POA: Diagnosis not present

## 2013-12-03 DIAGNOSIS — Z833 Family history of diabetes mellitus: Secondary | ICD-10-CM | POA: Diagnosis not present

## 2013-12-03 LAB — POCT GLYCOSYLATED HEMOGLOBIN (HGB A1C): Hemoglobin A1C: 7.2

## 2013-12-03 LAB — GLUCOSE, POCT (MANUAL RESULT ENTRY): POC Glucose: 106 mg/dl — AB (ref 70–99)

## 2013-12-03 MED ORDER — METFORMIN HCL 1000 MG PO TABS: 1000.0000 mg | ORAL_TABLET | Freq: Two times a day (BID) | ORAL | Status: DC

## 2013-12-03 MED ORDER — LISINOPRIL 10 MG PO TABS: ORAL_TABLET | ORAL | Status: DC

## 2013-12-03 MED ORDER — FREESTYLE LANCETS MISC: Status: DC

## 2013-12-03 MED ORDER — FREESTYLE SYSTEM KIT: 1.0000 | PACK | Status: DC | PRN

## 2013-12-03 MED ORDER — INSULIN GLARGINE 100 UNIT/ML ~~LOC~~ SOLN: 40.0000 [IU] | Freq: Every day | SUBCUTANEOUS | Status: DC

## 2013-12-03 MED ORDER — SIMVASTATIN 40 MG PO TABS: 40.0000 mg | ORAL_TABLET | Freq: Every day | ORAL | Status: DC

## 2013-12-03 MED ORDER — GLUCOSE BLOOD VI STRP: ORAL_STRIP | Status: DC

## 2013-12-03 NOTE — Progress Notes (Signed)
Patient ID: Stephanie Neal, female   DOB: [birth year], 58 y.o.   MRN: [id number]  CC: HTN, HLD, DM  HPI:  Patient presents today for a follow up of HTN, HLD, and DM.  Patient reports that she has been taking 36 units of Lantus daily but does not check her BS because her meter always shows error.  Patient reports that she is not sure if she knows how to work her meter.  Patient reports that she takes her medication daily but is concerned about checking her BS.  Patient admits to not taking her zocor daily because she often forgets at night.    Allergies  Allergen Reactions  . Penicillins     Reaction: unknown childhood allergy   Past Medical History  Diagnosis Date  . Hypertension   . Diabetes mellitus   . Hyperlipidemia    Current Outpatient Prescriptions on File Prior to Visit  Medication Sig Dispense Refill  . insulin glargine (LANTUS) 100 UNIT/ML injection Inject 40 Units into the skin at bedtime.      Marland Kitchen lisinopril (PRINIVIL,ZESTRIL) 10 MG tablet TAKE 1 TABLET EVERY DAY  30 tablet  5  . metFORMIN (GLUCOPHAGE) 1000 MG tablet Take 1,000 mg by mouth 2 (two) times daily with a meal.      . simvastatin (ZOCOR) 40 MG tablet Take 40 mg by mouth daily.      . traMADol (ULTRAM) 50 MG tablet Take 1 tablet (50 mg total) by mouth every 6 (six) hours as needed for pain.  15 tablet  0  . ibuprofen (ADVIL,MOTRIN) 200 MG tablet Take 800 mg by mouth every 6 (six) hours as needed. For pain      . lisinopril-hydrochlorothiazide (PRINZIDE,ZESTORETIC) 10-12.5 MG per tablet Take 1 tablet by mouth daily.      Marland Kitchen omeprazole (PRILOSEC) 20 MG capsule Take 1 capsule (20 mg total) by mouth daily.  20 capsule  0   No current facility-administered medications on file prior to visit.   Family History  Problem Relation Age of Onset  . Hypertension Mother   . Diabetes Mother   . COPD Father   . Heart disease Father   . Diabetes Father    History   Social History  . Marital Status: Single    Spouse Name: N/A     Number of Children: N/A  . Years of Education: N/A   Occupational History  . Not on file.   Social History Main Topics  . Smoking status: Current Every Day Smoker  . Smokeless tobacco: Not on file  . Alcohol Use: No  . Drug Use: No  . Sexual Activity: Not on file   Other Topics Concern  . Not on file   Social History Narrative  . No narrative on file   Review of Systems  HENT: Negative.   Eyes: Negative.   Respiratory: Negative.   Cardiovascular: Negative.   Genitourinary: Negative for frequency.  Neurological: Positive for tingling (right foot). Negative for dizziness.  Endo/Heme/Allergies: Negative for polydipsia.     Objective:   Filed Vitals:   12/03/13 1022  BP: 119/71  Pulse: 79  Temp: 97.9 F (36.6 C)  Resp: 14   Physical Exam  Constitutional: She is oriented to person, place, and time.  Neck: Normal range of motion.  Cardiovascular: Normal rate, regular rhythm and normal heart sounds.   Pulmonary/Chest: Effort normal and breath sounds normal.  Abdominal: Soft. Bowel sounds are normal.  Musculoskeletal: Normal range of motion. She exhibits no  edema and no tenderness.  Neurological: She is alert and oriented to person, place, and time.  Skin: Skin is warm and dry.  '  Lab Results  Component Value Date   WBC 5.4 09/08/2013   HGB 13.0 09/08/2013   HCT 38.4 09/08/2013   MCV 82.1 09/08/2013   PLT 184 09/08/2013   Lab Results  Component Value Date   CREATININE 0.87 09/08/2013   BUN 14 09/08/2013   NA 140 09/08/2013   K 4.2 09/08/2013   CL 104 09/08/2013   CO2 28 09/08/2013    Lab Results  Component Value Date   HGBA1C 7.2 12/03/2013   Lipid Panel     Component Value Date/Time   CHOL 247* 09/08/2013 0859   TRIG 164* 09/08/2013 0859   HDL 36* 09/08/2013 0859   CHOLHDL 6.9 09/08/2013 0859   VLDL 33 09/08/2013 0859   LDLCALC 178* 09/08/2013 0859       Assessment and plan:   Flois was seen today for follow-up and diabetes.  Diagnoses and  associated orders for this visit:  Type 2 diabetes mellitus without complication - Glucose (CBG) - HgB A1c - insulin glargine (LANTUS) 100 UNIT/ML injection; Inject 0.4 mLs (40 Units total) into the skin at bedtime. - metFORMIN (GLUCOPHAGE) 1000 MG tablet; Take 1 tablet (1,000 mg total) by mouth 2 (two) times daily with a meal. - glucose monitoring kit (FREESTYLE) monitoring kit; 1 each by Does not apply route as needed for other. - Lancets (FREESTYLE) lancets; Use as instructed  Essential hypertension - lisinopril (PRINIVIL,ZESTRIL) 10 MG tablet; TAKE 1 TABLET EVERY DAY  HLD (hyperlipidemia) - simvastatin (ZOCOR) 40 MG tablet; Take 1 tablet (40 mg total) by mouth daily at 6 PM.      Return in about 3 months (around 03/05/2014) for DM/HTN.       Chari Manning, NP-C [facility] [phone] 12/03/2013, 11:06 AM

## 2013-12-03 NOTE — Progress Notes (Signed)
Patient presents for 3 month f/u on DM, HTN and hyperlipidemia States feels "great" today Trying to quit smoking Brought paperwork for diabetic shoes

## 2013-12-03 NOTE — Patient Instructions (Signed)
Smoking Cessation Quitting smoking is important to your health and has many advantages. However, it is not always easy to quit since nicotine is a very addictive drug. Oftentimes, people try 3 times or more before being able to quit. This document explains the best ways for you to prepare to quit smoking. Quitting takes hard work and a lot of effort, but you can do it. ADVANTAGES OF QUITTING SMOKING  You will live longer, feel better, and live better.  Your body will feel the impact of quitting smoking almost immediately.  Within 20 minutes, blood pressure decreases. Your pulse returns to its normal level.  After 8 hours, carbon monoxide levels in the blood return to normal. Your oxygen level increases.  After 24 hours, the chance of having a heart attack starts to decrease. Your breath, hair, and body stop smelling like smoke.  After 48 hours, damaged nerve endings begin to recover. Your sense of taste and smell improve.  After 72 hours, the body is virtually free of nicotine. Your bronchial tubes relax and breathing becomes easier.  After 2 to 12 weeks, lungs can hold more air. Exercise becomes easier and circulation improves.  The risk of having a heart attack, stroke, cancer, or lung disease is greatly reduced.  After 1 year, the risk of coronary heart disease is cut in half.  After 5 years, the risk of stroke falls to the same as a nonsmoker.  After 10 years, the risk of lung cancer is cut in half and the risk of other cancers decreases significantly.  After 15 years, the risk of coronary heart disease drops, usually to the level of a nonsmoker.  If you are pregnant, quitting smoking will improve your chances of having a healthy baby.  The people you live with, especially any children, will be healthier.  You will have extra money to spend on things other than cigarettes. QUESTIONS TO THINK ABOUT BEFORE ATTEMPTING TO QUIT You may want to talk about your answers with your  health care provider.  Why do you want to quit?  If you tried to quit in the past, what helped and what did not?  What will be the most difficult situations for you after you quit? How will you plan to handle them?  Who can help you through the tough times? Your family? Friends? A health care provider?  What pleasures do you get from smoking? What ways can you still get pleasure if you quit? Here are some questions to ask your health care provider:  How can you help me to be successful at quitting?  What medicine do you think would be best for me and how should I take it?  What should I do if I need more help?  What is smoking withdrawal like? How can I get information on withdrawal? GET READY  Set a quit date.  Change your environment by getting rid of all cigarettes, ashtrays, matches, and lighters in your home, car, or work. Do not let people smoke in your home.  Review your past attempts to quit. Think about what worked and what did not. GET SUPPORT AND ENCOURAGEMENT You have a better chance of being successful if you have help. You can get support in many ways.  Tell your family, friends, and coworkers that you are going to quit and need their support. Ask them not to smoke around you.  Get individual, group, or telephone counseling and support. Programs are available at local hospitals and health centers. Call   your local health department for information about programs in your area.  Spiritual beliefs and practices may help some smokers quit.  Download a "quit meter" on your computer to keep track of quit statistics, such as how long you have gone without smoking, cigarettes not smoked, and money saved.  Get a self-help book about quitting smoking and staying off tobacco. LEARN NEW SKILLS AND BEHAVIORS  Distract yourself from urges to smoke. Talk to someone, go for a walk, or occupy your time with a task.  Change your normal routine. Take a different route to work.  Drink tea instead of coffee. Eat breakfast in a different place.  Reduce your stress. Take a hot bath, exercise, or read a book.  Plan something enjoyable to do every day. Reward yourself for not smoking.  Explore interactive web-based programs that specialize in helping you quit. GET MEDICINE AND USE IT CORRECTLY Medicines can help you stop smoking and decrease the urge to smoke. Combining medicine with the above behavioral methods and support can greatly increase your chances of successfully quitting smoking.  Nicotine replacement therapy helps deliver nicotine to your body without the negative effects and risks of smoking. Nicotine replacement therapy includes nicotine gum, lozenges, inhalers, nasal sprays, and skin patches. Some may be available over-the-counter and others require a prescription.  Antidepressant medicine helps people abstain from smoking, but how this works is unknown. This medicine is available by prescription.  Nicotinic receptor partial agonist medicine simulates the effect of nicotine in your brain. This medicine is available by prescription. Ask your health care provider for advice about which medicines to use and how to use them based on your health history. Your health care provider will tell you what side effects to look out for if you choose to be on a medicine or therapy. Carefully read the information on the package. Do not use any other product containing nicotine while using a nicotine replacement product.  RELAPSE OR DIFFICULT SITUATIONS Most relapses occur within the first 3 months after quitting. Do not be discouraged if you start smoking again. Remember, most people try several times before finally quitting. You may have symptoms of withdrawal because your body is used to nicotine. You may crave cigarettes, be irritable, feel very hungry, cough often, get headaches, or have difficulty concentrating. The withdrawal symptoms are only temporary. They are strongest  when you first quit, but they will go away within 10-14 days. To reduce the chances of relapse, try to:  Avoid drinking alcohol. Drinking lowers your chances of successfully quitting.  Reduce the amount of caffeine you consume. Once you quit smoking, the amount of caffeine in your body increases and can give you symptoms, such as a rapid heartbeat, sweating, and anxiety.  Avoid smokers because they can make you want to smoke.  Do not let weight gain distract you. Many smokers will gain weight when they quit, usually less than 10 pounds. Eat a healthy diet and stay active. You can always lose the weight gained after you quit.  Find ways to improve your mood other than smoking. FOR MORE INFORMATION  www.smokefree.gov  Document Released: 04/11/2001 Document Revised: 09/01/2013 Document Reviewed: 07/27/2011 [employer] Patient Information 2015 [employer]. This information is not intended to replace advice given to you by your health care provider. Make sure you discuss any questions you have with your health care provider.  

## 2013-12-04 ENCOUNTER — Other Ambulatory Visit: Payer: Self-pay

## 2013-12-04 MED ORDER — FREESTYLE SYSTEM KIT: 1.0000 | PACK | Status: DC | PRN

## 2013-12-11 ENCOUNTER — Other Ambulatory Visit: Payer: Self-pay | Admitting: Emergency Medicine

## 2013-12-11 DIAGNOSIS — E119 Type 2 diabetes mellitus without complications: Secondary | ICD-10-CM

## 2013-12-11 MED ORDER — GLUCOSE BLOOD VI STRP: ORAL_STRIP | Status: DC

## 2014-03-05 ENCOUNTER — Encounter: Payer: Self-pay | Admitting: Internal Medicine

## 2014-03-05 ENCOUNTER — Ambulatory Visit: Payer: Medicaid Other | Attending: Internal Medicine | Admitting: Internal Medicine

## 2014-03-05 VITALS — BP 144/72 | HR 84 | Temp 97.9°F | Resp 20 | Ht 71.0 in | Wt 201.4 lb

## 2014-03-05 DIAGNOSIS — Z794 Long term (current) use of insulin: Secondary | ICD-10-CM | POA: Insufficient documentation

## 2014-03-05 DIAGNOSIS — J309 Allergic rhinitis, unspecified: Secondary | ICD-10-CM | POA: Insufficient documentation

## 2014-03-05 DIAGNOSIS — H1012 Acute atopic conjunctivitis, left eye: Secondary | ICD-10-CM | POA: Diagnosis not present

## 2014-03-05 DIAGNOSIS — E119 Type 2 diabetes mellitus without complications: Secondary | ICD-10-CM | POA: Insufficient documentation

## 2014-03-05 DIAGNOSIS — Z23 Encounter for immunization: Secondary | ICD-10-CM | POA: Insufficient documentation

## 2014-03-05 DIAGNOSIS — F17210 Nicotine dependence, cigarettes, uncomplicated: Secondary | ICD-10-CM | POA: Diagnosis not present

## 2014-03-05 DIAGNOSIS — E785 Hyperlipidemia, unspecified: Secondary | ICD-10-CM | POA: Diagnosis not present

## 2014-03-05 DIAGNOSIS — F17200 Nicotine dependence, unspecified, uncomplicated: Secondary | ICD-10-CM

## 2014-03-05 DIAGNOSIS — I10 Essential (primary) hypertension: Secondary | ICD-10-CM | POA: Insufficient documentation

## 2014-03-05 DIAGNOSIS — Z720 Tobacco use: Secondary | ICD-10-CM

## 2014-03-05 LAB — POCT GLYCOSYLATED HEMOGLOBIN (HGB A1C): Hemoglobin A1C: 7.4

## 2014-03-05 LAB — GLUCOSE, POCT (MANUAL RESULT ENTRY): POC GLUCOSE: 121 mg/dL — AB (ref 70–99)

## 2014-03-05 MED ORDER — LISINOPRIL 10 MG PO TABS: ORAL_TABLET | ORAL | Status: DC

## 2014-03-05 MED ORDER — INSULIN GLARGINE 100 UNIT/ML ~~LOC~~ SOLN: 40.0000 [IU] | Freq: Every day | SUBCUTANEOUS | Status: DC

## 2014-03-05 MED ORDER — LORATADINE 10 MG PO TABS: 10.0000 mg | ORAL_TABLET | Freq: Every day | ORAL | Status: DC

## 2014-03-05 MED ORDER — KETOTIFEN FUMARATE 0.025 % OP SOLN: 1.0000 [drp] | Freq: Two times a day (BID) | OPHTHALMIC | Status: DC

## 2014-03-05 MED ORDER — SIMVASTATIN 40 MG PO TABS: 40.0000 mg | ORAL_TABLET | Freq: Every day | ORAL | Status: DC

## 2014-03-05 MED ORDER — METFORMIN HCL 1000 MG PO TABS: 1000.0000 mg | ORAL_TABLET | Freq: Two times a day (BID) | ORAL | Status: DC

## 2014-03-05 NOTE — Progress Notes (Signed)
Patient ID: Stephanie Neal, female   DOB: 02/23/1956, 58 y.o.   MRN: [id number]  CC:  DM, HTN follow up  HPI:  Patient presents to clinic today for a three month follow up of DM and HTN.  She states that she checks her BS at home every couple of days that are usually all under 200.  She reports that she tries to remember to take her insulin at night but forgets some days.  She reports that she had been getting very upset with her boyfriend who she believes he is becoming a alcoholic.  She does not like her current situation and is hoping for change soon.  She is only smoking 3 cigarettes per day which she believes helps her nerves.   Allergies  Allergen Reactions  . Penicillins     Reaction: unknown childhood allergy   Past Medical History  Diagnosis Date  . Hypertension   . Diabetes mellitus   . Hyperlipidemia    Current Outpatient Prescriptions on File Prior to Visit  Medication Sig Dispense Refill  . glucose blood test strip Use as instructed 100 each 12  . glucose monitoring kit (FREESTYLE) monitoring kit 1 each by Does not apply route as needed for other. 1 each 0  . ibuprofen (ADVIL,MOTRIN) 200 MG tablet Take 800 mg by mouth every 6 (six) hours as needed. For pain    . insulin glargine (LANTUS) 100 UNIT/ML injection Inject 0.4 mLs (40 Units total) into the skin at bedtime. 10 mL 4  . Lancets (FREESTYLE) lancets Use as instructed 100 each 12  . lisinopril (PRINIVIL,ZESTRIL) 10 MG tablet TAKE 1 TABLET EVERY DAY 30 tablet 5  . metFORMIN (GLUCOPHAGE) 1000 MG tablet Take 1 tablet (1,000 mg total) by mouth 2 (two) times daily with a meal. 60 tablet 4  . simvastatin (ZOCOR) 40 MG tablet Take 1 tablet (40 mg total) by mouth daily at 6 PM. 30 tablet 4  . glucose monitoring kit (FREESTYLE) monitoring kit 1 each by Does not apply route as needed for other. Check blood sugar TID & QHS 1 each 0   No current facility-administered medications on file prior to visit.   Family History  Problem  Relation Age of Onset  . Hypertension Mother   . Diabetes Mother   . COPD Father   . Heart disease Father   . Diabetes Father    History   Social History  . Marital Status: Single    Spouse Name: N/A    Number of Children: N/A  . Years of Education: N/A   Occupational History  . Not on file.   Social History Main Topics  . Smoking status: Current Every Day Smoker  . Smokeless tobacco: Not on file  . Alcohol Use: No  . Drug Use: No  . Sexual Activity: Not on file   Other Topics Concern  . Not on file   Social History Narrative    Review of Systems  Eyes: Positive for discharge and redness. Negative for blurred vision and double vision.  Respiratory: Negative.   Cardiovascular: Negative.   Gastrointestinal: Negative for abdominal pain and constipation.  Genitourinary: Negative for urgency and frequency.  Neurological: Negative for dizziness, tremors and headaches.  Endo/Heme/Allergies: Positive for environmental allergies.      Objective:   Filed Vitals:   03/05/14 1005  BP: 144/72  Pulse: 84  Temp: 97.9 F (36.6 C)  Resp: 20    Physical Exam  Constitutional: She is oriented to person, place,   and time.  HENT:  Mouth/Throat: Oropharynx is clear and moist.  Eyes: Scleral icterus is present.  Cardiovascular: Normal rate, regular rhythm and normal heart sounds.   Pulmonary/Chest: Effort normal and breath sounds normal.  Abdominal: Soft. Bowel sounds are normal.  Musculoskeletal: She exhibits no edema.  Neurological: She is alert and oriented to person, place, and time.  Skin: Skin is warm and dry.     Lab Results  Component Value Date   WBC 5.4 09/08/2013   HGB 13.0 09/08/2013   HCT 38.4 09/08/2013   MCV 82.1 09/08/2013   PLT 184 09/08/2013   Lab Results  Component Value Date   CREATININE 0.87 09/08/2013   BUN 14 09/08/2013   NA 140 09/08/2013   K 4.2 09/08/2013   CL 104 09/08/2013   CO2 28 09/08/2013    Lab Results  Component Value  Date   HGBA1C 7.2 12/03/2013   Lipid Panel     Component Value Date/Time   CHOL 247* 09/08/2013 0859   TRIG 164* 09/08/2013 0859   HDL 36* 09/08/2013 0859   CHOLHDL 6.9 09/08/2013 0859   VLDL 33 09/08/2013 0859   LDLCALC 178* 09/08/2013 0859       Assessment and plan:   Stephanie Neal was seen today for follow-up, diabetes and hypertension.  Diagnoses and associated orders for this visit:  Type 2 diabetes mellitus without complication - Glucose (CBG) - HgB A1c - Microalbumin, urine - Refill lisinopril (PRINIVIL,ZESTRIL) 10 MG tablet; TAKE 1 TABLET EVERY DAY - Refill insulin glargine (LANTUS) 100 UNIT/ML injection; Inject 0.4 mLs (40 Units total) into the skin at bedtime. - Refill metFORMIN (GLUCOPHAGE) 1000 MG tablet; Take 1 tablet (1,000 mg total) by mouth 2 (two) times daily with a meal. Stress medication compliance and checking blood sugar while on insulin  Essential hypertension - Refill lisinopril (PRINIVIL,ZESTRIL) 10 MG tablet; TAKE 1 TABLET EVERY DAY. Controlled on current therapy  HLD (hyperlipidemia) - Refill simvastatin (ZOCOR) 40 MG tablet; Take 1 tablet (40 mg total) by mouth daily at 6 PM. Continue DASH diet Allergic conjunctivitis and rhinitis, left - ketotifen (ZADITOR) 0.025 % ophthalmic solution; Place 1 drop into the left eye 2 (two) times daily. - loratadine (CLARITIN) 10 MG tablet; Take 1 tablet (10 mg total) by mouth daily.  Need for prophylactic vaccination and inoculation against influenza - Flu Vaccine QUAD 36+ mos PF IM (Fluarix Quad PF)   Smoker Smoking cessation discussed relating to heart disease   Return in about 3 months (around 06/05/2014) for DM/HTN.       KECK, VALERIE, NP-C [facility] [phone] 03/05/2014, 10:22 AM  

## 2014-03-05 NOTE — Progress Notes (Signed)
Patient presents for 3 month f/u TD2M and HTNM, Hyperlipidemia Smoking 3 cigs per day CC: left eye "twitchy" with clear drainage for 3 days Reports not checking CBG at home on regular basis BP 144/72 States she cried this AM over family argument

## 2014-03-05 NOTE — Patient Instructions (Addendum)
DASH Eating Plan DASH stands for "Dietary Approaches to Stop Hypertension." The DASH eating plan is a healthy eating plan that has been shown to reduce high blood pressure (hypertension). Additional health benefits may include reducing the risk of type 2 diabetes mellitus, heart disease, and stroke. The DASH eating plan may also help with weight loss. WHAT DO I NEED TO KNOW ABOUT THE DASH EATING PLAN? For the DASH eating plan, you will follow these general guidelines:  Choose foods with a percent daily value for sodium of less than 5% (as listed on the food label).  Use salt-free seasonings or herbs instead of table salt or sea salt.  Check with your health care provider or pharmacist before using salt substitutes.  Eat lower-sodium products, often labeled as "lower sodium" or "no salt added."  Eat fresh foods.  Eat more vegetables, fruits, and low-fat dairy products.  Choose whole grains. Look for the word "whole" as the first word in the ingredient list.  Choose fish and skinless chicken or turkey more often than red meat. Limit fish, poultry, and meat to 6 oz (170 g) each day.  Limit sweets, desserts, sugars, and sugary drinks.  Choose heart-healthy fats.  Limit cheese to 1 oz (28 g) per day.  Eat more home-cooked food and less restaurant, buffet, and fast food.  Limit fried foods.  Cook foods using methods other than frying.  Limit canned vegetables. If you do use them, rinse them well to decrease the sodium.  When eating at a restaurant, ask that your food be prepared with less salt, or no salt if possible. WHAT FOODS CAN I EAT? Seek help from a dietitian for individual calorie needs. Grains Whole grain or whole wheat bread. Brown rice. Whole grain or whole wheat pasta. Quinoa, bulgur, and whole grain cereals. Low-sodium cereals. Corn or whole wheat flour tortillas. Whole grain cornbread. Whole grain crackers. Low-sodium crackers. Vegetables Fresh or frozen vegetables  (raw, steamed, roasted, or grilled). Low-sodium or reduced-sodium tomato and vegetable juices. Low-sodium or reduced-sodium tomato sauce and paste. Low-sodium or reduced-sodium canned vegetables.  Fruits All fresh, canned (in natural juice), or frozen fruits. Meat and Other Protein Products Ground beef (85% or leaner), grass-fed beef, or beef trimmed of fat. Skinless chicken or turkey. Ground chicken or turkey. Pork trimmed of fat. All fish and seafood. Eggs. Dried beans, peas, or lentils. Unsalted nuts and seeds. Unsalted canned beans. Dairy Low-fat dairy products, such as skim or 1% milk, 2% or reduced-fat cheeses, low-fat ricotta or cottage cheese, or plain low-fat yogurt. Low-sodium or reduced-sodium cheeses. Fats and Oils Tub margarines without trans fats. Light or reduced-fat mayonnaise and salad dressings (reduced sodium). Avocado. Safflower, olive, or canola oils. Natural peanut or almond butter. Other Unsalted popcorn and pretzels. The items listed above may not be a complete list of recommended foods or beverages. Contact your dietitian for more options. WHAT FOODS ARE NOT RECOMMENDED? Grains White bread. White pasta. White rice. Refined cornbread. Bagels and croissants. Crackers that contain trans fat. Vegetables Creamed or fried vegetables. Vegetables in a cheese sauce. Regular canned vegetables. Regular canned tomato sauce and paste. Regular tomato and vegetable juices. Fruits Dried fruits. Canned fruit in light or heavy syrup. Fruit juice. Meat and Other Protein Products Fatty cuts of meat. Ribs, chicken wings, bacon, sausage, bologna, salami, chitterlings, fatback, hot dogs, bratwurst, and packaged luncheon meats. Salted nuts and seeds. Canned beans with salt. Dairy Whole or 2% milk, cream, half-and-half, and cream cheese. Whole-fat or sweetened yogurt. Full-fat   cheeses or blue cheese. Nondairy creamers and whipped toppings. Processed cheese, cheese spreads, or cheese  curds. Condiments Onion and garlic salt, seasoned salt, table salt, and sea salt. Canned and packaged gravies. Worcestershire sauce. Tartar sauce. Barbecue sauce. Teriyaki sauce. Soy sauce, including reduced sodium. Steak sauce. Fish sauce. Oyster sauce. Cocktail sauce. Horseradish. Ketchup and mustard. Meat flavorings and tenderizers. Bouillon cubes. Hot sauce. Tabasco sauce. Marinades. Taco seasonings. Relishes. Fats and Oils Butter, stick margarine, lard, shortening, ghee, and bacon fat. Coconut, palm kernel, or palm oils. Regular salad dressings. Other Pickles and olives. Salted popcorn and pretzels. The items listed above may not be a complete list of foods and beverages to avoid. Contact your dietitian for more information. WHERE CAN I FIND MORE INFORMATION? National Heart, Lung, [employer] Institute: www.nhlbi.nih.gov/health/health-topics/topics/dash/ Document Released: 04/06/2011 Document Revised: 09/01/2013 Document Reviewed: 02/19/2013 [employer] Patient Information 2015 [employer]. This information is not intended to replace advice given to you by your health care provider. Make sure you discuss any questions you have with your health care provider. Smoking Cessation Quitting smoking is important to your health and has many advantages. However, it is not always easy to quit since nicotine is a very addictive drug. Oftentimes, people try 3 times or more before being able to quit. This document explains the best ways for you to prepare to quit smoking. Quitting takes hard work and a lot of effort, but you can do it. ADVANTAGES OF QUITTING SMOKING  You will live longer, feel better, and live better.  Your body will feel the impact of quitting smoking almost immediately.  Within 20 minutes, blood pressure decreases. Your pulse returns to its normal level.  After 8 hours, carbon monoxide levels in the blood return to normal. Your oxygen level increases.  After 24 hours, the chance of  having a heart attack starts to decrease. Your breath, hair, and body stop smelling like smoke.  After 48 hours, damaged nerve endings begin to recover. Your sense of taste and smell improve.  After 72 hours, the body is virtually free of nicotine. Your bronchial tubes relax and breathing becomes easier.  After 2 to 12 weeks, lungs can hold more air. Exercise becomes easier and circulation improves.  The risk of having a heart attack, stroke, cancer, or lung disease is greatly reduced.  After 1 year, the risk of coronary heart disease is cut in half.  After 5 years, the risk of stroke falls to the same as a nonsmoker.  After 10 years, the risk of lung cancer is cut in half and the risk of other cancers decreases significantly.  After 15 years, the risk of coronary heart disease drops, usually to the level of a nonsmoker.  If you are pregnant, quitting smoking will improve your chances of having a healthy baby.  The people you live with, especially any children, will be healthier.  You will have extra money to spend on things other than cigarettes. QUESTIONS TO THINK ABOUT BEFORE ATTEMPTING TO QUIT You may want to talk about your answers with your health care provider.  Why do you want to quit?  If you tried to quit in the past, what helped and what did not?  What will be the most difficult situations for you after you quit? How will you plan to handle them?  Who can help you through the tough times? Your family? Friends? A health care provider?  What pleasures do you get from smoking? What ways can you still get pleasure   if you quit? Here are some questions to ask your health care provider:  How can you help me to be successful at quitting?  What medicine do you think would be best for me and how should I take it?  What should I do if I need more help?  What is smoking withdrawal like? How can I get information on withdrawal? GET READY  Set a quit date.  Change your  environment by getting rid of all cigarettes, ashtrays, matches, and lighters in your home, car, or work. Do not let people smoke in your home.  Review your past attempts to quit. Think about what worked and what did not. GET SUPPORT AND ENCOURAGEMENT You have a better chance of being successful if you have help. You can get support in many ways.  Tell your family, friends, and coworkers that you are going to quit and need their support. Ask them not to smoke around you.  Get individual, group, or telephone counseling and support. Programs are available at local hospitals and health centers. Call your local health department for information about programs in your area.  Spiritual beliefs and practices may help some smokers quit.  Download a "quit meter" on your computer to keep track of quit statistics, such as how long you have gone without smoking, cigarettes not smoked, and money saved.  Get a self-help book about quitting smoking and staying off tobacco. LEARN NEW SKILLS AND BEHAVIORS  Distract yourself from urges to smoke. Talk to someone, go for a walk, or occupy your time with a task.  Change your normal routine. Take a different route to work. Drink tea instead of coffee. Eat breakfast in a different place.  Reduce your stress. Take a hot bath, exercise, or read a book.  Plan something enjoyable to do every day. Reward yourself for not smoking.  Explore interactive web-based programs that specialize in helping you quit. GET MEDICINE AND USE IT CORRECTLY Medicines can help you stop smoking and decrease the urge to smoke. Combining medicine with the above behavioral methods and support can greatly increase your chances of successfully quitting smoking.  Nicotine replacement therapy helps deliver nicotine to your body without the negative effects and risks of smoking. Nicotine replacement therapy includes nicotine gum, lozenges, inhalers, nasal sprays, and skin patches. Some may be  available over-the-counter and others require a prescription.  Antidepressant medicine helps people abstain from smoking, but how this works is unknown. This medicine is available by prescription.  Nicotinic receptor partial agonist medicine simulates the effect of nicotine in your brain. This medicine is available by prescription. Ask your health care provider for advice about which medicines to use and how to use them based on your health history. Your health care provider will tell you what side effects to look out for if you choose to be on a medicine or therapy. Carefully read the information on the package. Do not use any other product containing nicotine while using a nicotine replacement product.  RELAPSE OR DIFFICULT SITUATIONS Most relapses occur within the first 3 months after quitting. Do not be discouraged if you start smoking again. Remember, most people try several times before finally quitting. You may have symptoms of withdrawal because your body is used to nicotine. You may crave cigarettes, be irritable, feel very hungry, cough often, get headaches, or have difficulty concentrating. The withdrawal symptoms are only temporary. They are strongest when you first quit, but they will go away within 10-14 days. To reduce the   chances of relapse, try to:  Avoid drinking alcohol. Drinking lowers your chances of successfully quitting.  Reduce the amount of caffeine you consume. Once you quit smoking, the amount of caffeine in your body increases and can give you symptoms, such as a rapid heartbeat, sweating, and anxiety.  Avoid smokers because they can make you want to smoke.  Do not let weight gain distract you. Many smokers will gain weight when they quit, usually less than 10 pounds. Eat a healthy diet and stay active. You can always lose the weight gained after you quit.  Find ways to improve your mood other than smoking. FOR MORE INFORMATION  www.smokefree.gov  Document Released:  04/11/2001 Document Revised: 09/01/2013 Document Reviewed: 07/27/2011 [employer] Patient Information 2015 [employer]. This information is not intended to replace advice given to you by your health care provider. Make sure you discuss any questions you have with your health care provider.  

## 2014-03-06 LAB — MICROALBUMIN, URINE: Microalb, Ur: 0.8 mg/dL (ref <2.0)

## 2014-04-27 ENCOUNTER — Ambulatory Visit: Payer: Medicaid Other | Attending: Internal Medicine

## 2014-04-27 DIAGNOSIS — Z0000 Encounter for general adult medical examination without abnormal findings: Secondary | ICD-10-CM

## 2014-04-28 LAB — RPR

## 2014-04-28 LAB — HEPATITIS PANEL, ACUTE
HCV AB: NEGATIVE
HEP A IGM: NONREACTIVE
HEP B C IGM: NONREACTIVE
Hepatitis B Surface Ag: NEGATIVE

## 2014-04-28 LAB — HIV ANTIBODY (ROUTINE TESTING W REFLEX): HIV 1&2 Ab, 4th Generation: NONREACTIVE

## 2014-04-30 ENCOUNTER — Telehealth: Payer: Self-pay | Admitting: Internal Medicine

## 2014-04-30 NOTE — Telephone Encounter (Signed)
Called patient to let her know that she came back negative for HIV, Syphilis, and Hepatitis

## 2014-06-05 ENCOUNTER — Encounter (HOSPITAL_COMMUNITY): Payer: Self-pay | Admitting: Emergency Medicine

## 2014-06-05 ENCOUNTER — Emergency Department (HOSPITAL_COMMUNITY)
Admission: EM | Admit: 2014-06-05 | Discharge: 2014-06-05 | Disposition: A | Discharge status: Home / Self Care | Payer: Medicaid Other | Source: Home / Self Care | Attending: Family Medicine | Admitting: Family Medicine

## 2014-06-05 ENCOUNTER — Other Ambulatory Visit: Payer: Self-pay | Admitting: Internal Medicine

## 2014-06-05 ENCOUNTER — Emergency Department (INDEPENDENT_AMBULATORY_CARE_PROVIDER_SITE_OTHER): Payer: Medicaid Other

## 2014-06-05 DIAGNOSIS — R350 Frequency of micturition: Secondary | ICD-10-CM

## 2014-06-05 DIAGNOSIS — R109 Unspecified abdominal pain: Secondary | ICD-10-CM | POA: Diagnosis not present

## 2014-06-05 LAB — POCT I-STAT, CHEM 8
BUN: 13 mg/dL (ref 6–23)
Calcium, Ion: 1.14 mmol/L (ref 1.13–1.30)
Chloride: 106 mmol/L (ref 96–112)
Creatinine, Ser: 1 mg/dL (ref 0.50–1.10)
GLUCOSE: 102 mg/dL — AB (ref 70–99)
HCT: 41 % (ref 36.0–46.0)
HEMOGLOBIN: 13.9 g/dL (ref 12.0–15.0)
Potassium: 4.1 mmol/L (ref 3.5–5.1)
Sodium: 141 mmol/L (ref 135–145)
TCO2: 22 mmol/L (ref 0–100)

## 2014-06-05 LAB — POCT URINALYSIS DIP (DEVICE)
BILIRUBIN URINE: NEGATIVE
GLUCOSE, UA: NEGATIVE mg/dL
HGB URINE DIPSTICK: NEGATIVE
Ketones, ur: NEGATIVE mg/dL
Nitrite: NEGATIVE
Protein, ur: NEGATIVE mg/dL
SPECIFIC GRAVITY, URINE: 1.02 (ref 1.005–1.030)
UROBILINOGEN UA: 1 mg/dL (ref 0.0–1.0)
pH: 7 (ref 5.0–8.0)

## 2014-06-05 MED ORDER — CEFUROXIME AXETIL 250 MG PO TABS: 250.0000 mg | ORAL_TABLET | Freq: Two times a day (BID) | ORAL | Status: DC

## 2014-06-05 MED ORDER — POLYETHYLENE GLYCOL 3350 17 G PO PACK: 17.0000 g | PACK | Freq: Every day | ORAL | Status: DC

## 2014-06-05 NOTE — ED Notes (Signed)
C/o lower abd pain onset 3 weeks; also reports constipation x2 days and urinary freq Denies fevers, chills Alert, no signs of acute distress.

## 2014-06-05 NOTE — Discharge Instructions (Signed)
Abdominal Pain, Women °Abdominal (stomach, pelvic, or belly) pain can be caused by many things. It is important to tell your doctor: °· The location of the pain. °· Does it come and go or is it present all the time? °· Are there things that start the pain (eating certain foods, exercise)? °· Are there other symptoms associated with the pain (fever, nausea, vomiting, diarrhea)? °All of this is helpful to know when trying to find the cause of the pain. °CAUSES  °· Stomach: virus or bacteria infection, or ulcer. °· Intestine: appendicitis (inflamed appendix), regional ileitis (Crohn's disease), ulcerative colitis (inflamed colon), irritable bowel syndrome, diverticulitis (inflamed diverticulum of the colon), or cancer of the stomach or intestine. °· Gallbladder disease or stones in the gallbladder. °· Kidney disease, kidney stones, or infection. °· Pancreas infection or cancer. °· Fibromyalgia (pain disorder). °· Diseases of the female organs: °¨ Uterus: fibroid (non-cancerous) tumors or infection. °¨ Fallopian tubes: infection or tubal pregnancy. °¨ Ovary: cysts or tumors. °¨ Pelvic adhesions (scar tissue). °¨ Endometriosis (uterus lining tissue growing in the pelvis and on the pelvic organs). °¨ Pelvic congestion syndrome (female organs filling up with blood just before the menstrual period). °¨ Pain with the menstrual period. °¨ Pain with ovulation (producing an egg). °¨ Pain with an IUD (intrauterine device, birth control) in the uterus. °¨ Cancer of the female organs. °· Functional pain (pain not caused by a disease, may improve without treatment). °· Psychological pain. °· Depression. °DIAGNOSIS  °Your doctor will decide the seriousness of your pain by doing an examination. °· Blood tests. °· X-rays. °· Ultrasound. °· CT scan (computed tomography, special type of X-ray). °· MRI (magnetic resonance imaging). °· Cultures, for infection. °· Barium enema (dye inserted in the large intestine, to better view it with  X-rays). °· Colonoscopy (looking in intestine with a lighted tube). °· Laparoscopy (minor surgery, looking in abdomen with a lighted tube). °· Major abdominal exploratory surgery (looking in abdomen with a large incision). °TREATMENT  °The treatment will depend on the cause of the pain.  °· Many cases can be observed and treated at home. °· Over-the-counter medicines recommended by your caregiver. °· Prescription medicine. °· Antibiotics, for infection. °· Birth control pills, for painful periods or for ovulation pain. °· Hormone treatment, for endometriosis. °· Nerve blocking injections. °· Physical therapy. °· Antidepressants. °· Counseling with a psychologist or psychiatrist. °· Minor or major surgery. °HOME CARE INSTRUCTIONS  °· Do not take laxatives, unless directed by your caregiver. °· Take over-the-counter pain medicine only if ordered by your caregiver. Do not take aspirin because it can cause an upset stomach or bleeding. °· Try a clear liquid diet (broth or water) as ordered by your caregiver. Slowly move to a bland diet, as tolerated, if the pain is related to the stomach or intestine. °· Have a thermometer and take your temperature several times a day, and record it. °· Bed rest and sleep, if it helps the pain. °· Avoid sexual intercourse, if it causes pain. °· Avoid stressful situations. °· Keep your follow-up appointments and tests, as your caregiver orders. °· If the pain does not go away with medicine or surgery, you may try: °· Acupuncture. °· Relaxation exercises (yoga, meditation). °· Group therapy. °· Counseling. °SEEK MEDICAL CARE IF:  °· You notice certain foods cause stomach pain. °· Your home care treatment is not helping your pain. °· You need stronger pain medicine. °· You want your IUD removed. °· You feel faint or   lightheaded.  You develop nausea and vomiting.  You develop a rash.  You are having side effects or an allergy to your medicine. SEEK IMMEDIATE MEDICAL CARE IF:   Your  pain does not go away or gets worse.  You have a fever.  Your pain is felt only in portions of the abdomen. The right side could possibly be appendicitis. The left lower portion of the abdomen could be colitis or diverticulitis.  You are passing blood in your stools (bright red or black tarry stools, with or without vomiting).  You have blood in your urine.  You develop chills, with or without a fever.  You pass out. MAKE SURE YOU:   Understand these instructions.  Will watch your condition.  Will get help right away if you are not doing well or get worse. Document Released: 02/12/2007 Document Revised: 09/01/2013 Document Reviewed: 03/04/2009 [facility] Patient Information 2015 [place], [place]. This information is not intended to replace advice given to you by your health care provider. Make sure you discuss any questions you have with your health care provider.  Urinary Frequency The number of times a normal person urinates depends upon how much liquid they take in and how much liquid they are losing. If the temperature is hot and there is high humidity, then the person will sweat more and usually breathe a little more frequently. These factors decrease the amount of frequency of urination that would be considered normal. The amount you drink is easily determined, but the amount of fluid lost is sometimes more difficult to calculate.  Fluid is lost in two ways:  Sensible fluid loss is usually measured by the amount of urine that you get rid of. Losses of fluid can also occur with diarrhea.  Insensible fluid loss is more difficult to measure. It is caused by evaporation. Insensible loss of fluid occurs through breathing and sweating. It usually ranges from a little less than a quart to a little more than a quart of fluid a day. In normal temperatures and activity levels, the average person may urinate 4 to 7 times in a 24-hour period. Needing to urinate more often than that could  indicate a problem. If one urinates 4 to 7 times in 24 hours and has large volumes each time, that could indicate a different problem from one who urinates 4 to 7 times a day and has small volumes. The time of urinating is also important. Most urinating should be done during the waking hours. Getting up at night to urinate frequently can indicate some problems. CAUSES  The bladder is the organ in your lower abdomen that holds urine. Like a balloon, it swells some as it fills up. Your nerves sense this and tell you it is time to head for the bathroom. There are a number of reasons that you might feel the need to urinate more often than usual. They include:  Urinary tract infection. This is usually associated with other signs such as burning when you urinate.  In men, problems with the prostate (a walnut-size gland that is located near the tube that carries urine out of your body). There are two reasons why the prostate can cause an increased frequency of urination:  An enlarged prostate that does not let the bladder empty well. If the bladder only half empties when you urinate, then it only has half the capacity to fill before you have to urinate again.  The nerves in the bladder become more hypersensitive with an increased size  of the prostate even if the bladder empties completely.  Pregnancy.  Obesity. Excess weight is more likely to cause a problem for women than for men.  Bladder stones or other bladder problems.  Caffeine.  Alcohol.  Medications. For example, drugs that help the body get rid of extra fluid (diuretics) increase urine production. Some other medicines must be taken with lots of fluids.  Muscle or nerve weakness. This might be the result of a spinal cord injury, a stroke, multiple sclerosis, or Parkinson disease.  Long-standing diabetes can decrease the sensation of the bladder. This loss of sensation makes it harder to sense the bladder needs to be emptied. Over a period  of years, the bladder is stretched out by constant overfilling. This weakens the bladder muscles so that the bladder does not empty well and has less capacity to fill with new urine.  Interstitial cystitis (also called painful bladder syndrome). This condition develops because the tissues that line the inside of the bladder are inflamed (inflammation is the body's way of reacting to injury or infection). It causes pain and frequent urination. It occurs in women more often than in men. DIAGNOSIS   To decide what might be causing your urinary frequency, your health care provider will probably:  Ask about symptoms you have noticed.  Ask about your overall health. This will include questions about any medications you are taking.  Do a physical examination.  Order some tests. These might include:  A blood test to check for diabetes or other health issues that could be contributing to the problem.  Urine testing. This could measure the flow of urine and the pressure on the bladder.  A test of your neurological system (the brain, spinal cord, and nerves). This is the system that senses the need to urinate.  A bladder test to check whether it is emptying completely when you urinate.  Cystoscopy. This test uses a thin tube with a tiny camera on it. It offers a look inside your urethra and bladder to see if there are problems.  Imaging tests. You might be given a contrast dye and then asked to urinate. X-rays are taken to see how your bladder is working. TREATMENT  It is important for you to be evaluated to determine if the amount or frequency that you have is unusual or abnormal. If it is found to be abnormal, the cause should be determined and this can usually be found out easily. Depending upon the cause, treatment could include medication, stimulation of the nerves, or surgery. There are not too many things that you can do as an individual to change your urinary frequency. It is important that  you balance the amount of fluid intake needed to compensate for your activity and the temperature. Medical problems will be diagnosed and taken care of by your physician. There is no particular bladder training such as Kegel exercises that you can do to help urinary frequency. This is an exercise that is usually recommended for people who have leaking of urine when they laugh, cough, or sneeze. HOME CARE INSTRUCTIONS   Take any medications your health care provider prescribed or suggested. Follow the directions carefully.  Practice any lifestyle changes that are recommended. These might include:  Drinking less fluid or drinking at different times of the day. If you need to urinate often during the night, for example, you may need to stop drinking fluids early in the evening.  Cutting down on caffeine or alcohol. They both can make  you need to urinate more often than normal. Caffeine is found in coffee, tea, and sodas.  Losing weight, if that is recommended.  Keep a journal or a log. You might be asked to record how much you drink and when and where you feel the need to urinate. This will also help evaluate how well the treatment provided by your physician is working. SEEK MEDICAL CARE IF:   Your need to urinate often gets worse.  You feel increased pain or irritation when you urinate.  You notice blood in your urine.  You have questions about any medications that your health care provider recommended.  You notice blood, pus, or swelling at the site of any test or treatment procedure.  You develop a fever of more than 100.34F (38.1C). SEEK IMMEDIATE MEDICAL CARE IF:  You develop a fever of more than 102.73F (38.9C). Document Released: 02/11/2009 Document Revised: 09/01/2013 Document Reviewed: 02/11/2009 [facility] Patient Information 2015 [place], [place]. This information is not intended to replace advice given to you by your health care provider. Make sure you discuss any questions you  have with your health care provider.

## 2014-06-05 NOTE — ED Provider Notes (Signed)
CSN: [id number]     Arrival date & time 06/05/14  1508 History   First MD Initiated Contact with Patient 06/05/14 1525     Chief Complaint  Patient presents with  . Abdominal Pain   (Consider location/radiation/quality/duration/timing/severity/associated sxs/prior Treatment) HPI      59 year old female presents for evaluation of abdominal pain and urinary frequency, and 2 days of constipation. She reports that for the past 3 weeks she has had mild left-sided lower abdominal cramping pain. In the past couple of days the pain has moved also over to the right side. In addition she has not had a bowel movement in 2 days which is abnormal for her. She has frequent urination but denies polydipsia. Her blood sugars have been normal in the morning the 70s and 80s. No fever, chills, NVD, chest pain, shortness of breath. No recent travel or sick contacts.  Past Medical History  Diagnosis Date  . Hypertension   . Diabetes mellitus   . Hyperlipidemia    History reviewed. No pertinent past surgical history. Family History  Problem Relation Age of Onset  . Hypertension Mother   . Diabetes Mother   . COPD Father   . Heart disease Father   . Diabetes Father    History  Substance Use Topics  . Smoking status: Current Every Day Smoker  . Smokeless tobacco: Not on file  . Alcohol Use: No   OB History    No data available     Review of Systems  Constitutional: Negative for fever and chills.  Gastrointestinal: Positive for abdominal pain and constipation. Negative for nausea, vomiting, diarrhea, blood in stool and anal bleeding.  Endocrine: Positive for polyuria. Negative for polydipsia.  Genitourinary: Positive for urgency and frequency. Negative for dysuria and hematuria.  All other systems reviewed and are negative.   Allergies  Penicillins  Home Medications   Prior to Admission medications   Medication Sig Start Date End Date Taking? Authorizing Provider  insulin glargine (LANTUS) 100  UNIT/ML injection Inject 0.4 mLs (40 Units total) into the skin at bedtime. 03/05/14  Yes Lance Bosch, NP  lisinopril (PRINIVIL,ZESTRIL) 10 MG tablet TAKE 1 TABLET EVERY DAY 03/05/14  Yes Lance Bosch, NP  metFORMIN (GLUCOPHAGE) 1000 MG tablet Take 1 tablet (1,000 mg total) by mouth 2 (two) times daily with a meal. 03/05/14  Yes Lance Bosch, NP  simvastatin (ZOCOR) 40 MG tablet Take 1 tablet (40 mg total) by mouth daily at 6 PM. 03/05/14  Yes Lance Bosch, NP  cefUROXime (CEFTIN) 250 MG tablet Take 1 tablet (250 mg total) by mouth 2 (two) times daily with a meal. 06/05/14   Liam Graham, PA-C  glucose blood test strip Use as instructed 12/11/13   Lance Bosch, NP  glucose monitoring kit (FREESTYLE) monitoring kit 1 each by Does not apply route as needed for other. 12/03/13   Lance Bosch, NP  glucose monitoring kit (FREESTYLE) monitoring kit 1 each by Does not apply route as needed for other. Check blood sugar TID & QHS 12/04/13   Lance Bosch, NP  ibuprofen (ADVIL,MOTRIN) 200 MG tablet Take 800 mg by mouth every 6 (six) hours as needed. For pain    Historical Provider, MD  ketotifen (ZADITOR) 0.025 % ophthalmic solution Place 1 drop into the left eye 2 (two) times daily. 03/05/14   Lance Bosch, NP  Lancets (FREESTYLE) lancets Use as instructed 12/03/13   Lance Bosch, NP  loratadine (CLARITIN)  10 MG tablet Take 1 tablet (10 mg total) by mouth daily. 03/05/14   Lance Bosch, NP  polyethylene glycol [facility]) packet Take 17 g by mouth daily. 06/05/14   Freeman Caldron Sanaya Gwilliam, PA-C   BP 128/75 mmHg  Pulse 82  Temp(Src) 98.9 F (37.2 C) (Oral)  Resp 18  SpO2 98% Physical Exam  Constitutional: She is oriented to person, place, and time. Vital signs are normal. She appears well-developed and well-nourished. No distress.  HENT:  Head: Normocephalic and atraumatic.  Cardiovascular: Normal rate, regular rhythm and normal heart sounds.   Pulmonary/Chest: Effort normal and breath sounds normal. No  respiratory distress.  Abdominal: Soft. Bowel sounds are normal. She exhibits no distension and no mass. There is no tenderness. There is no rebound and no guarding.  Neurological: She is alert and oriented to person, place, and time. She has normal strength. Coordination normal.  Skin: Skin is warm and dry. No rash noted. She is not diaphoretic.  Psychiatric: She has a normal mood and affect. Judgment normal.  Nursing note and vitals reviewed.   ED Course  Procedures (including critical care time) Labs Review Labs Reviewed  POCT URINALYSIS DIP (DEVICE) - Abnormal; Notable for the following:    Leukocytes, UA TRACE (*)    All other components within normal limits  POCT I-STAT, CHEM 8 - Abnormal; Notable for the following:    Glucose, Bld 102 (*)    All other components within normal limits  URINE CULTURE    Imaging Review Dg Abd 2 Views  06/05/2014   CLINICAL DATA:  Abdominal pain for 3 weeks  EXAM: ABDOMEN - 2 VIEW  COMPARISON:  None.  FINDINGS: The bowel gas pattern is normal. There is no evidence of free air. No radio-opaque calculi or other significant radiographic abnormality is seen. Mild rightward curvature of the lumbar spine centered at L2 noted. Moderate stool burden.  IMPRESSION: Normal bowel gas pattern.  Moderate stool burden.   Electronically Signed   By: Conchita Paris M.D.   On: 06/05/2014 16:19     MDM   1. Abdominal pain, unspecified abdominal location   2. Urinary frequency    Treat for UTI and for constipation. Return here or go to the ED if worsening.  Meds ordered this encounter  Medications  . cefUROXime (CEFTIN) 250 MG tablet    Sig: Take 1 tablet (250 mg total) by mouth 2 (two) times daily with a meal.    Dispense:  10 tablet    Refill:  0  . polyethylene glycol (MIRALAX) packet    Sig: Take 17 g by mouth daily.    Dispense:  14 each    Refill:  [place] Joli Koob, PA-C 06/05/14 [phone]

## 2014-06-07 LAB — URINE CULTURE: Colony Count: >=100000

## 2014-06-09 ENCOUNTER — Telehealth: Payer: Self-pay | Admitting: Emergency Medicine

## 2014-06-09 NOTE — Telephone Encounter (Signed)
Pt made an appointment to see a health coach

## 2014-06-11 ENCOUNTER — Telehealth: Payer: Self-pay | Admitting: Emergency Medicine

## 2014-06-11 NOTE — Telephone Encounter (Signed)
Pt rescheduled health coach appt. For 2/18 1000am

## 2014-06-15 ENCOUNTER — Ambulatory Visit: Payer: Medicaid Other | Admitting: Internal Medicine

## 2014-06-25 ENCOUNTER — Ambulatory Visit: Payer: Medicaid Other | Attending: Internal Medicine | Admitting: Internal Medicine

## 2014-06-25 ENCOUNTER — Encounter: Payer: Self-pay | Admitting: Internal Medicine

## 2014-06-25 VITALS — BP 123/72 | HR 79 | Temp 98.5°F | Resp 16 | Ht 71.0 in | Wt 202.0 lb

## 2014-06-25 DIAGNOSIS — F17210 Nicotine dependence, cigarettes, uncomplicated: Secondary | ICD-10-CM | POA: Insufficient documentation

## 2014-06-25 DIAGNOSIS — I10 Essential (primary) hypertension: Secondary | ICD-10-CM | POA: Diagnosis not present

## 2014-06-25 DIAGNOSIS — Z23 Encounter for immunization: Secondary | ICD-10-CM | POA: Diagnosis not present

## 2014-06-25 DIAGNOSIS — E119 Type 2 diabetes mellitus without complications: Secondary | ICD-10-CM | POA: Diagnosis not present

## 2014-06-25 DIAGNOSIS — E785 Hyperlipidemia, unspecified: Secondary | ICD-10-CM | POA: Diagnosis not present

## 2014-06-25 LAB — GLUCOSE, POCT (MANUAL RESULT ENTRY): POC Glucose: 226 mg/dl — AB (ref 70–99)

## 2014-06-25 LAB — POCT GLYCOSYLATED HEMOGLOBIN (HGB A1C): HEMOGLOBIN A1C: 7

## 2014-06-25 MED ORDER — LISINOPRIL 10 MG PO TABS: ORAL_TABLET | ORAL | Status: DC

## 2014-06-25 MED ORDER — INSULIN PEN NEEDLE 31G X 8 MM MISC: Status: DC

## 2014-06-25 MED ORDER — SIMVASTATIN 40 MG PO TABS: 40.0000 mg | ORAL_TABLET | Freq: Every day | ORAL | Status: DC

## 2014-06-25 MED ORDER — METFORMIN HCL 1000 MG PO TABS: 1000.0000 mg | ORAL_TABLET | Freq: Two times a day (BID) | ORAL | Status: DC

## 2014-06-25 MED ORDER — INSULIN GLARGINE 100 UNIT/ML ~~LOC~~ SOLN: 40.0000 [IU] | Freq: Every day | SUBCUTANEOUS | Status: DC

## 2014-06-25 NOTE — Patient Instructions (Signed)
Smoking Cessation Quitting smoking is important to your health and has many advantages. However, it is not always easy to quit since nicotine is a very addictive drug. Oftentimes, people try 3 times or more before being able to quit. This document explains the best ways for you to prepare to quit smoking. Quitting takes hard work and a lot of effort, but you can do it. ADVANTAGES OF QUITTING SMOKING  You will live longer, feel better, and live better.  Your body will feel the impact of quitting smoking almost immediately.  Within 20 minutes, blood pressure decreases. Your pulse returns to its normal level.  After 8 hours, carbon monoxide levels in the blood return to normal. Your oxygen level increases.  After 24 hours, the chance of having a heart attack starts to decrease. Your breath, hair, and body stop smelling like smoke.  After 48 hours, damaged nerve endings begin to recover. Your sense of taste and smell improve.  After 72 hours, the body is virtually free of nicotine. Your bronchial tubes relax and breathing becomes easier.  After 2 to 12 weeks, lungs can hold more air. Exercise becomes easier and circulation improves.  The risk of having a heart attack, stroke, cancer, or lung disease is greatly reduced.  After 1 year, the risk of coronary heart disease is cut in half.  After 5 years, the risk of stroke falls to the same as a nonsmoker.  After 10 years, the risk of lung cancer is cut in half and the risk of other cancers decreases significantly.  After 15 years, the risk of coronary heart disease drops, usually to the level of a nonsmoker.  If you are pregnant, quitting smoking will improve your chances of having a healthy baby.  The people you live with, especially any children, will be healthier.  You will have extra money to spend on things other than cigarettes. QUESTIONS TO THINK ABOUT BEFORE ATTEMPTING TO QUIT You may want to talk about your answers with your  health care provider.  Why do you want to quit?  If you tried to quit in the past, what helped and what did not?  What will be the most difficult situations for you after you quit? How will you plan to handle them?  Who can help you through the tough times? Your family? Friends? A health care provider?  What pleasures do you get from smoking? What ways can you still get pleasure if you quit? Here are some questions to ask your health care provider:  How can you help me to be successful at quitting?  What medicine do you think would be best for me and how should I take it?  What should I do if I need more help?  What is smoking withdrawal like? How can I get information on withdrawal? GET READY  Set a quit date.  Change your environment by getting rid of all cigarettes, ashtrays, matches, and lighters in your home, car, or work. Do not let people smoke in your home.  Review your past attempts to quit. Think about what worked and what did not. GET SUPPORT AND ENCOURAGEMENT You have a better chance of being successful if you have help. You can get support in many ways.  Tell your family, friends, and coworkers that you are going to quit and need their support. Ask them not to smoke around you.  Get individual, group, or telephone counseling and support. Programs are available at local hospitals and health centers. Call   your local health department for information about programs in your area.  Spiritual beliefs and practices may help some smokers quit.  Download a "quit meter" on your computer to keep track of quit statistics, such as how long you have gone without smoking, cigarettes not smoked, and money saved.  Get a self-help book about quitting smoking and staying off tobacco. LEARN NEW SKILLS AND BEHAVIORS  Distract yourself from urges to smoke. Talk to someone, go for a walk, or occupy your time with a task.  Change your normal routine. Take a different route to work.  Drink tea instead of coffee. Eat breakfast in a different place.  Reduce your stress. Take a hot bath, exercise, or read a book.  Plan something enjoyable to do every day. Reward yourself for not smoking.  Explore interactive web-based programs that specialize in helping you quit. GET MEDICINE AND USE IT CORRECTLY Medicines can help you stop smoking and decrease the urge to smoke. Combining medicine with the above behavioral methods and support can greatly increase your chances of successfully quitting smoking.  Nicotine replacement therapy helps deliver nicotine to your body without the negative effects and risks of smoking. Nicotine replacement therapy includes nicotine gum, lozenges, inhalers, nasal sprays, and skin patches. Some may be available over-the-counter and others require a prescription.  Antidepressant medicine helps people abstain from smoking, but how this works is unknown. This medicine is available by prescription.  Nicotinic receptor partial agonist medicine simulates the effect of nicotine in your brain. This medicine is available by prescription. Ask your health care provider for advice about which medicines to use and how to use them based on your health history. Your health care provider will tell you what side effects to look out for if you choose to be on a medicine or therapy. Carefully read the information on the package. Do not use any other product containing nicotine while using a nicotine replacement product.  RELAPSE OR DIFFICULT SITUATIONS Most relapses occur within the first 3 months after quitting. Do not be discouraged if you start smoking again. Remember, most people try several times before finally quitting. You may have symptoms of withdrawal because your body is used to nicotine. You may crave cigarettes, be irritable, feel very hungry, cough often, get headaches, or have difficulty concentrating. The withdrawal symptoms are only temporary. They are strongest  when you first quit, but they will go away within 10-14 days. To reduce the chances of relapse, try to:  Avoid drinking alcohol. Drinking lowers your chances of successfully quitting.  Reduce the amount of caffeine you consume. Once you quit smoking, the amount of caffeine in your body increases and can give you symptoms, such as a rapid heartbeat, sweating, and anxiety.  Avoid smokers because they can make you want to smoke.  Do not let weight gain distract you. Many smokers will gain weight when they quit, usually less than 10 pounds. Eat a healthy diet and stay active. You can always lose the weight gained after you quit.  Find ways to improve your mood other than smoking. FOR MORE INFORMATION  www.smokefree.gov  Document Released: 04/11/2001 Document Revised: 09/01/2013 Document Reviewed: 07/27/2011 [facility] Patient Information 2015 [place], [place]. This information is not intended to replace advice given to you by your health care provider. Make sure you discuss any questions you have with your health care provider. Pneumococcal Vaccine, Polyvalent solution for injection What is this medicine? PNEUMOCOCCAL VACCINE, POLYVALENT (NEU mo KOK al vak SEEN, pol ee VEY  luhnt) is a vaccine to prevent pneumococcus bacteria infection. These bacteria are a major cause of ear infections, Strep throat infections, and serious pneumonia, meningitis, or blood infections worldwide. These vaccines help the body to produce antibodies (protective substances) that help your body defend against these bacteria. This vaccine is recommended for people 45 years of age and older with health problems. It is also recommended for all adults over 21 years old. This vaccine will not treat an infection. This medicine may be used for other purposes; ask your health care provider or pharmacist if you have questions. COMMON BRAND NAME(S): Pneumovax 23 What should I tell my health care provider before I take this medicine? They  need to know if you have any of these conditions: -bleeding problems -bone marrow or organ transplant -cancer, Hodgkin's disease -fever -infection -immune system problems -low platelet count in the blood -seizures -an unusual or allergic reaction to pneumococcal vaccine, diphtheria toxoid, other vaccines, latex, other medicines, foods, dyes, or preservatives -pregnant or trying to get pregnant -breast-feeding How should I use this medicine? This vaccine is for injection into a muscle or under the skin. It is given by a health care professional. A copy of Vaccine Information Statements will be given before each vaccination. Read this sheet carefully each time. The sheet may change frequently. Talk to your pediatrician regarding the use of this medicine in children. While this drug may be prescribed for children as young as 23 years of age for selected conditions, precautions do apply. Overdosage: If you think you have taken too much of this medicine contact a poison control center or emergency room at once. NOTE: This medicine is only for you. Do not share this medicine with others. What if I miss a dose? It is important not to miss your dose. Call your doctor or health care professional if you are unable to keep an appointment. What may interact with this medicine? -medicines for cancer chemotherapy -medicines that suppress your immune function -medicines that treat or prevent blood clots like warfarin, enoxaparin, and dalteparin -steroid medicines like prednisone or cortisone This list may not describe all possible interactions. Give your health care provider a list of all the medicines, herbs, non-prescription drugs, or dietary supplements you use. Also tell them if you smoke, drink alcohol, or use illegal drugs. Some items may interact with your medicine. What should I watch for while using this medicine? Mild fever and pain should go away in 3 days or less. Report any unusual symptoms  to your doctor or health care professional. What side effects may I notice from receiving this medicine? Side effects that you should report to your doctor or health care professional as soon as possible: -allergic reactions like skin rash, itching or hives, swelling of the face, lips, or tongue -breathing problems -confused -fever over 102 degrees F -pain, tingling, numbness in the hands or feet -seizures -unusual bleeding or bruising -unusual muscle weakness Side effects that usually do not require medical attention (report to your doctor or health care professional if they continue or are bothersome): -aches and pains -diarrhea -fever of 102 degrees F or less -headache -irritable -loss of appetite -pain, tender at site where injected -trouble sleeping This list may not describe all possible side effects. Call your doctor for medical advice about side effects. You may report side effects to FDA at 1-800-FDA-1088. Where should I keep my medicine? This does not apply. This vaccine is given in a clinic, pharmacy, doctor's office, or other health  care setting and will not be stored at home. NOTE: This sheet is a summary. It may not cover all possible information. If you have questions about this medicine, talk to your doctor, pharmacist, or health care provider.  2015, Elsevier/Gold Standard. (2007-11-22 14:32:37)

## 2014-06-25 NOTE — Progress Notes (Signed)
Patient ID: Stephanie Neal Lieder, female   DOB: 05/19/1955, 59 y.o.   MRN: [id number] 1. HTN: Medication: lisinopril daily Home BP monitoring: checks weekly usually 120/80 Negative WUJ:WJXBJYNWGROS:headaches, chest pain, SOB, palpitations, edema   2. DM2:  Medication: Metformin  Home CBG monitoring: usually below 200 Hypoglycemic event: none Positive ROS: none Negative NFA:OZHYQMVHROS:polyuria, polydipsia, blurred vision, skin intact (feet)  3. HLD: Medication: simvastatin dialy Tolerance: well Negative QIO:NGEXBMWROS:myalgia, claudication  Social History reviewed: Smoker 3 -6 cigarettes per day Exercise not currently  Needs pap smear and mammogram in July   Physical Exam  Constitutional: She is oriented to person, place, and time.  Cardiovascular: Normal rate, regular rhythm and normal heart sounds.   Pulmonary/Chest: Effort normal and breath sounds normal.  Musculoskeletal: She exhibits no edema.  Feet:  Right Foot:  Skin Integrity: Negative for ulcer or skin breakdown.  Left Foot:  Skin Integrity: Negative for ulcer or skin breakdown.  Neurological: She is alert and oriented to person, place, and time.    Stephanie Neal was seen today for follow-up, hypertension and diabetes.  Diagnoses and all orders for this visit:  Type 2 diabetes mellitus without complication Orders: -     HgB A1c -     Glucose (CBG) -     insulin glargine (LANTUS) 100 UNIT/ML injection; Inject 0.4 mLs (40 Units total) into the skin at bedtime. -     Insulin Pen Needle 31G X 8 MM MISC; Use lantus nightly for E11.9 -     lisinopril (PRINIVIL,ZESTRIL) 10 MG tablet; TAKE 1 TABLET EVERY DAY -     metFORMIN (GLUCOPHAGE) 1000 MG tablet; Take 1 tablet (1,000 mg total) by mouth 2 (two) times daily with a meal. Patients diabetes is well control as evidence by consistently low a1c.  Patient will continue with current therapy and continue to make necessary lifestyle changes.  Reviewed foot care, diet, exercise, annual health maintenance with patient.    Essential hypertension Orders: -     lisinopril (PRINIVIL,ZESTRIL) 10 MG tablet; TAKE 1 TABLET EVERY DAY Patient blood pressure is stable and may continue on current medication.  Education on diet, exercise, and modifiable risk factors discussed. Will obtain appropriate labs as needed. Will follow up in 3-6 months.   HLD (hyperlipidemia) Orders: -     simvastatin (ZOCOR) 40 MG tablet; Take 1 tablet (40 mg total) by mouth daily at 6 PM. Education provided on proper lifestyle changes in order to lower cholesterol. Patient advised to maintain healthy weight and to keep total fat intake at 25-35% of total calories and carbohydrates 50-60% of total daily calories. Explained how high cholesterol places patient at risk for heart disease. Patient placed on appropriate medication and repeat labs in 6 months  -     Pneumococcal conjugate vaccine 13-valent  Return in about 6 months (around 12/24/2014).  [place] [facility], [place], NP 07/14/2014 6:06 PM

## 2014-06-25 NOTE — Progress Notes (Signed)
Pt comes in to f/u with DM, HTn medication management Pt states she is compliant with taking prescribed meds daily Takes 36 units Lantus injection @ bedtime Denies blurry vision,dizziness or numb/tingling  Need needles refilled  Need PNA vaccine, mammogram, pap smear

## 2014-07-14 LAB — HM DIABETES EYE EXAM

## 2014-08-27 ENCOUNTER — Encounter (HOSPITAL_COMMUNITY): Payer: Self-pay | Admitting: Emergency Medicine

## 2014-08-27 ENCOUNTER — Emergency Department (INDEPENDENT_AMBULATORY_CARE_PROVIDER_SITE_OTHER)
Admission: EM | Admit: 2014-08-27 | Discharge: 2014-08-27 | Disposition: A | Discharge status: Home / Self Care | Payer: Medicaid Other | Source: Home / Self Care | Attending: Emergency Medicine | Admitting: Emergency Medicine

## 2014-08-27 ENCOUNTER — Emergency Department (INDEPENDENT_AMBULATORY_CARE_PROVIDER_SITE_OTHER): Payer: Medicaid Other

## 2014-08-27 DIAGNOSIS — K5900 Constipation, unspecified: Secondary | ICD-10-CM | POA: Diagnosis not present

## 2014-08-27 MED ORDER — BISACODYL 10 MG/30ML RE ENEM: 10.0000 mg | ENEMA | Freq: Once | RECTAL | Status: DC

## 2014-08-27 NOTE — Discharge Instructions (Signed)
You have constipation. Use the Fleet enema today. If you have not had a large bowel movement by tomorrow, please go to the emergency room.

## 2014-08-27 NOTE — ED Notes (Signed)
C/o center epigastric pain.  Patient is convinced pain stems from constipation.  History of the same.  Patient has had 2 episodes of nausea, no vomiting.  Patient has had a week of abdominal pain, inability to have bm.  Last bm was Monday followed by mom, miralax, and exlax.

## 2014-08-27 NOTE — ED Provider Notes (Signed)
CSN: [id number]     Arrival date & time 08/27/14  1121 History   First MD Initiated Contact with Patient 08/27/14 1328     Chief Complaint  Patient presents with  . Abdominal Pain   (Consider location/radiation/quality/duration/timing/severity/associated sxs/prior Treatment) HPI She is a 59 year old woman here for evaluation of abdominal pain.  She states she has had constipation for the last week or so. Over the weekend and early this week she took milk of magnesia and MiraLAX which resulted in small hard bowel movements Monday and Tuesday. She continues to feel backed up. She states it feels like she needs to have a bowel movement, but she can't get anything out. She does report some nausea and reflux, but no vomiting. No fevers. She has had a similar episode previously, but that responded to milk of magnesia and MiraLAX. She has been passing gas and tolerating liquids.  Past Medical History  Diagnosis Date  . Hypertension   . Diabetes mellitus   . Hyperlipidemia    History reviewed. No pertinent past surgical history. Family History  Problem Relation Age of Onset  . Hypertension Mother   . Diabetes Mother   . COPD Father   . Heart disease Father   . Diabetes Father    History  Substance Use Topics  . Smoking status: Current Every Day Smoker  . Smokeless tobacco: Not on file  . Alcohol Use: No   OB History    No data available     Review of Systems As in history of present illness Allergies  Penicillins  Home Medications   Prior to Admission medications   Medication Sig Start Date End Date Taking? Authorizing Provider  magnesium hydroxide (MILK OF MAGNESIA) 400 MG/5ML suspension Take by mouth daily as needed for mild constipation.   Yes Historical Provider, MD  bisacodyl (FLEET) 10 MG/30ML ENEM Place 30 mLs (10 mg total) rectally once. 08/27/14   Melony Overly, MD  glucose blood test strip Use as instructed 12/11/13   Lance Bosch, NP  glucose monitoring kit  (FREESTYLE) monitoring kit 1 each by Does not apply route as needed for other. 12/03/13   Lance Bosch, NP  glucose monitoring kit (FREESTYLE) monitoring kit 1 each by Does not apply route as needed for other. Check blood sugar TID & QHS 12/04/13   Lance Bosch, NP  ibuprofen (ADVIL,MOTRIN) 200 MG tablet Take 800 mg by mouth every 6 (six) hours as needed. For pain    Historical Provider, MD  insulin glargine (LANTUS) 100 UNIT/ML injection Inject 0.4 mLs (40 Units total) into the skin at bedtime. 06/25/14   Lance Bosch, NP  Insulin Pen Needle 31G X 8 MM MISC Use lantus nightly for E11.9 06/25/14   Lance Bosch, NP  Lancets (FREESTYLE) lancets Use as instructed 12/03/13   Lance Bosch, NP  lisinopril (PRINIVIL,ZESTRIL) 10 MG tablet TAKE 1 TABLET EVERY DAY 06/25/14   Lance Bosch, NP  metFORMIN (GLUCOPHAGE) 1000 MG tablet Take 1 tablet (1,000 mg total) by mouth 2 (two) times daily with a meal. 06/25/14   Lance Bosch, NP  simvastatin (ZOCOR) 40 MG tablet Take 1 tablet (40 mg total) by mouth daily at 6 PM. 06/25/14   Lance Bosch, NP   BP 129/82 mmHg  Pulse 88  Temp(Src) 98.4 F (36.9 C) (Oral)  Resp 16  SpO2 96% Physical Exam  Constitutional: She is oriented to person, place, and time. She appears well-developed and well-nourished.  No distress.  Cardiovascular: Normal rate.   Pulmonary/Chest: Effort normal.  Abdominal: Soft. Bowel sounds are normal. She exhibits distension. She exhibits no mass. There is tenderness (in epigastric). There is no rebound and no guarding.  Neurological: She is alert and oriented to person, place, and time.    ED Course  Procedures (including critical care time) Labs Review Labs Reviewed - No data to display  Imaging Review Dg Abd 1 View  08/27/2014   CLINICAL DATA:  Constipation.  EXAM: ABDOMEN - 1 VIEW  COMPARISON:  None.  FINDINGS: Soft tissue structures are unremarkable. Prominent stool noted throughout the colon. No bowel distention or free air.  Stable calcifications in the abdomen and pelvis consistent with phleboliths. No acute bony abnormality.  IMPRESSION: Prominent amount of stool noted throughout colon consistent patient's clinical diagnosis of constipation. No acute abnormality. No bowel distention .   Electronically Signed   By: Marcello Moores  Register   On: 08/27/2014 14:30     MDM   1. Constipation, unspecified constipation type    Recommended use of a Fleet enema. If no bowel movement 24 hours after using the enema, she will go to the emergency room.    Melony Overly, MD 08/27/14 1434

## 2014-09-15 ENCOUNTER — Encounter (HOSPITAL_COMMUNITY): Payer: Self-pay | Admitting: Emergency Medicine

## 2014-09-15 ENCOUNTER — Emergency Department (HOSPITAL_COMMUNITY)
Admission: EM | Admit: 2014-09-15 | Discharge: 2014-09-15 | Disposition: A | Discharge status: Home / Self Care | Payer: Medicaid Other | Source: Home / Self Care | Attending: Family Medicine | Admitting: Family Medicine

## 2014-09-15 DIAGNOSIS — K2970 Gastritis, unspecified, without bleeding: Secondary | ICD-10-CM | POA: Diagnosis not present

## 2014-09-15 DIAGNOSIS — K2100 Gastro-esophageal reflux disease with esophagitis, without bleeding: Secondary | ICD-10-CM

## 2014-09-15 MED ORDER — SUCRALFATE 1 G PO TABS: 1.0000 g | ORAL_TABLET | Freq: Three times a day (TID) | ORAL | Status: DC

## 2014-09-15 MED ORDER — RANITIDINE HCL 150 MG PO CAPS: 150.0000 mg | ORAL_CAPSULE | Freq: Two times a day (BID) | ORAL | Status: DC

## 2014-09-15 MED ORDER — OMEPRAZOLE 20 MG PO CPDR: 20.0000 mg | DELAYED_RELEASE_CAPSULE | Freq: Every day | ORAL | Status: DC

## 2014-09-15 NOTE — Discharge Instructions (Signed)
Esophagitis Esophagitis is inflammation of the esophagus. It can involve swelling, soreness, and pain in the esophagus. This condition can make it difficult and painful to swallow. CAUSES  Most causes of esophagitis are not serious. Many different factors can cause esophagitis, including:  Gastroesophageal reflux disease (GERD). This is when acid from your stomach flows up into the esophagus.  Recurrent vomiting.  An allergic-type reaction.  Certain medicines, especially those that come in large pills.  Ingestion of harmful chemicals, such as household cleaning products.  Heavy alcohol use.  An infection of the esophagus.  Radiation treatment for cancer.  Certain diseases such as sarcoidosis, Crohn's disease, and scleroderma. These diseases may cause recurrent esophagitis. SYMPTOMS   Trouble swallowing.  Painful swallowing.  Chest pain.  Difficulty breathing.  Nausea.  Vomiting.  Abdominal pain. DIAGNOSIS  Your caregiver will take your history and do a physical exam. Depending upon what your caregiver finds, certain tests may also be done, including:  Barium X-ray. You will drink a solution that coats the esophagus, and X-rays will be taken.  Endoscopy. A lighted tube is put down the esophagus so your caregiver can examine the area.  Allergy tests. These can sometimes be arranged through follow-up visits. TREATMENT  Treatment will depend on the cause of your esophagitis. In some cases, steroids or other medicines may be given to help relieve your symptoms or to treat the underlying cause of your condition. Medicines that may be recommended include:  Viscous lidocaine, to soothe the esophagus.  Antacids.  Acid reducers.  Proton pump inhibitors.  Antiviral medicines for certain viral infections of the esophagus.  Antifungal medicines for certain fungal infections of the esophagus.  Antibiotic medicines, depending on the cause of the esophagitis. HOME CARE  INSTRUCTIONS   Avoid foods and drinks that seem to make your symptoms worse.  Eat small, frequent meals instead of large meals.  Avoid eating for the 3 hours prior to your bedtime.  If you have trouble taking pills, use a pill splitter to decrease the size and likelihood of the pill getting stuck or injuring the esophagus on the way down. Drinking water after taking a pill also helps.  Stop smoking if you smoke.  Maintain a healthy weight.  Wear loose-fitting clothing. Do not wear anything tight around your waist that causes pressure on your stomach.  Raise the head of your bed 6 to 8 inches with wood blocks to help you sleep. Extra pillows will not help.  Only take over-the-counter or prescription medicines as directed by your caregiver. SEEK IMMEDIATE MEDICAL CARE IF:  You have severe chest pain that radiates into your arm, neck, or jaw.  You feel sweaty, dizzy, or lightheaded.  You have shortness of breath.  You vomit blood.  You have difficulty or pain with swallowing.  You have bloody or black, tarry stools.  You have a fever.  You have a burning sensation in the chest more than 3 times a week for more than 2 weeks.  You cannot swallow, drink, or eat.  You drool because you cannot swallow your saliva. MAKE SURE YOU:  Understand these instructions.  Will watch your condition.  Will get help right away if you are not doing well or get worse. Document Released: 05/25/2004 Document Revised: 07/10/2011 Document Reviewed: 12/16/2010 [facility] Patient Information 2015 [employer], [place]. This information is not intended to replace advice given to you by your health care provider. Make sure you discuss any questions you have with your health care provider.  Gastritis, Adult Gastritis is soreness and puffiness (inflammation) of the lining of the stomach. If you do not get help, gastritis can cause bleeding and sores (ulcers) in the stomach. HOME CARE   Only take medicine  as told by your doctor.  If you were given antibiotic medicines, take them as told. Finish the medicines even if you start to feel better.  Drink enough fluids to keep your pee (urine) clear or pale yellow.  Avoid foods and drinks that make your problems worse. Foods you may want to avoid include:  Caffeine or alcohol.  Chocolate.  Mint.  Garlic and onions.  Spicy foods.  Citrus fruits, including oranges, lemons, or limes.  Food containing tomatoes, including sauce, chili, salsa, and pizza.  Fried and fatty foods.  Eat small meals throughout the day instead of large meals. GET HELP RIGHT AWAY IF:   You have black or dark red poop (stools).  You throw up (vomit) blood. It may look like coffee grounds.  You cannot keep fluids down.  Your belly (abdominal) pain gets worse.  You have a fever.  You do not feel better after 1 week.  You have any other questions or concerns. MAKE SURE YOU:   Understand these instructions.  Will watch your condition.  Will get help right away if you are not doing well or get worse. Document Released: 10/04/2007 Document Revised: 07/10/2011 Document Reviewed: 05/31/2011 [facility] Patient Information 2015 [place]. This information is not intended to replace advice given to you by your health care provider. Make sure you discuss any questions you have with your health care provider.  Gastroesophageal Reflux Disease, Adult Gastroesophageal reflux disease (GERD) happens when acid from your stomach goes into your food pipe (esophagus). The acid can cause a burning feeling in your chest. Over time, the acid can make small holes (ulcers) in your food pipe.  HOME CARE  Ask your doctor for advice about:  Losing weight.  Quitting smoking.  Alcohol use.  Avoid foods and drinks that make your problems worse. You may want to avoid:  Caffeine and alcohol.  Chocolate.  Mints.  Garlic and onions.  Spicy foods.  Citrus fruits,  such as oranges, lemons, or limes.  Foods that contain tomato, such as sauce, chili, salsa, and pizza.  Fried and fatty foods.  Avoid lying down for 3 hours before you go to bed or before you take a nap.  Eat small meals often, instead of large meals.  Wear loose-fitting clothing. Do not wear anything tight around your waist.  Raise (elevate) the head of your bed 6 to 8 inches with wood blocks. Using extra pillows does not help.  Only take medicines as told by your doctor.  Do not take aspirin or ibuprofen. GET HELP RIGHT AWAY IF:   You have pain in your arms, neck, jaw, teeth, or back.  Your pain gets worse or changes.  You feel sick to your stomach (nauseous), throw up (vomit), or sweat (diaphoresis).  You feel short of breath, or you pass out (faint).  Your throw up is green, yellow, black, or looks like coffee grounds or blood.  Your poop (stool) is red, bloody, or black. MAKE SURE YOU:   Understand these instructions.  Will watch your condition.  Will get help right away if you are not doing well or get worse. Document Released: 10/04/2007 Document Revised: 07/10/2011 Document Reviewed: 11/04/2010 [facility] Patient Information 2015 [place]. This information is not intended to replace advice given to  you by your health care provider. Make sure you discuss any questions you have with your health care provider.

## 2014-09-15 NOTE — ED Notes (Signed)
C/o  Epigastric pain, burning sensation.  Feeling bloated.  Pt states that she has been vomiting through out the night for the past week.  Pt has used enema's to  With mild relief in gassy feeling.  States "I burp a lot".  Denies fever and diarrhea.   Mild relief with otc meds.

## 2014-09-15 NOTE — ED Provider Notes (Signed)
CSN: [id number]     Arrival date & time 09/15/14  1857 History   First MD Initiated Contact with Patient 09/15/14 2046     Chief Complaint  Patient presents with  . Abdominal Pain  . Emesis   (Consider location/radiation/quality/duration/timing/severity/associated sxs/prior Treatment) HPI Comments: 59 year old female with a 3-4 day history of epigastric discomfort, reflux and vomiting. It is worse when supine. Last not she had vomiting. She states that Tums helps sometimes. It is worse when supine and early a.m. upon awakening.   Past Medical History  Diagnosis Date  . Hypertension   . Diabetes mellitus   . Hyperlipidemia    History reviewed. No pertinent past surgical history. Family History  Problem Relation Age of Onset  . Hypertension Mother   . Diabetes Mother   . COPD Father   . Heart disease Father   . Diabetes Father    History  Substance Use Topics  . Smoking status: Current Every Day Smoker  . Smokeless tobacco: Not on file  . Alcohol Use: No   OB History    No data available     Review of Systems  Constitutional: Positive for activity change. Negative for fever and fatigue.  HENT: Negative.   Respiratory: Negative.  Negative for cough and shortness of breath.   Cardiovascular: Negative for chest pain and palpitations.  Gastrointestinal: Positive for nausea, vomiting and abdominal pain. Negative for diarrhea, constipation and abdominal distention.  Genitourinary: Negative.   Neurological: Negative.     Allergies  Penicillins  Home Medications   Prior to Admission medications   Medication Sig Start Date End Date Taking? Authorizing Provider  bisacodyl (FLEET) 10 MG/30ML ENEM Place 30 mLs (10 mg total) rectally once. 08/27/14  Yes Melony Overly, MD  Insulin Pen Needle 31G X 8 MM MISC Use lantus nightly for E11.9 06/25/14  Yes Lance Bosch, NP  lisinopril (PRINIVIL,ZESTRIL) 10 MG tablet TAKE 1 TABLET EVERY DAY 06/25/14  Yes Lance Bosch, NP  magnesium  hydroxide (MILK OF MAGNESIA) 400 MG/5ML suspension Take by mouth daily as needed for mild constipation.   Yes Historical Provider, MD  metFORMIN (GLUCOPHAGE) 1000 MG tablet Take 1 tablet (1,000 mg total) by mouth 2 (two) times daily with a meal. 06/25/14  Yes Lance Bosch, NP  simvastatin (ZOCOR) 40 MG tablet Take 1 tablet (40 mg total) by mouth daily at 6 PM. 06/25/14  Yes Lance Bosch, NP  glucose blood test strip Use as instructed 12/11/13   Lance Bosch, NP  glucose monitoring kit (FREESTYLE) monitoring kit 1 each by Does not apply route as needed for other. 12/03/13   Lance Bosch, NP  glucose monitoring kit (FREESTYLE) monitoring kit 1 each by Does not apply route as needed for other. Check blood sugar TID & QHS 12/04/13   Lance Bosch, NP  ibuprofen (ADVIL,MOTRIN) 200 MG tablet Take 800 mg by mouth every 6 (six) hours as needed. For pain    Historical Provider, MD  insulin glargine (LANTUS) 100 UNIT/ML injection Inject 0.4 mLs (40 Units total) into the skin at bedtime. 06/25/14   Lance Bosch, NP  Lancets (FREESTYLE) lancets Use as instructed 12/03/13   Lance Bosch, NP  omeprazole (PRILOSEC) 20 MG capsule Take 1 capsule (20 mg total) by mouth daily. 09/15/14   Janne Napoleon, NP  ranitidine (ZANTAC) 150 MG capsule Take 1 capsule (150 mg total) by mouth 2 (two) times daily. 09/15/14   Janne Napoleon, NP  sucralfate (CARAFATE) 1 G tablet Take 1 tablet (1 g total) by mouth 4 (four) times daily -  with meals and at bedtime. 09/15/14   Janne Napoleon, NP   BP 143/80 mmHg  Pulse 78  Temp(Src) 97.3 F (36.3 C) (Oral)  Resp 20  SpO2 96% Physical Exam  Constitutional: She is oriented to person, place, and time. She appears well-developed and well-nourished. No distress.  Eyes: EOM are normal. Pupils are equal, round, and reactive to light.  Neck: Normal range of motion. Neck supple.  Cardiovascular: Normal rate, regular rhythm and normal heart sounds.   Pulmonary/Chest: Effort normal. She has wheezes.   Prolonged expiratory phase  Abdominal: Soft. Bowel sounds are normal. She exhibits no distension. There is tenderness. There is no rebound and no guarding.  Tenderness in the epigastrium. Percussion is tympanic in the epigastrium and left upper quadrant.  Musculoskeletal: She exhibits no edema.  Lymphadenopathy:    She has no cervical adenopathy.  Neurological: She is alert and oriented to person, place, and time. No cranial nerve deficit. She exhibits normal muscle tone.  Skin: Skin is warm and dry.  Psychiatric: She has a normal mood and affect.  Nursing note and vitals reviewed.   ED Course  Procedures (including critical care time) Labs Review Labs Reviewed - No data to display  Imaging Review No results found.   MDM   1. Gastroesophageal reflux disease with esophagitis   2. Gastritis    Do not eat 2 hours before lying down or going to bed Zantac 150 milligrams twice a day Omeprazole 20 mg daily Carafate 1 g by mouth 4 times a day before meals and at bedtime Upon awakening drank 1 glass of cool water in the morning Silverio Lay PCP tomorrow for an appointment to see/follow-up next week.     Janne Napoleon, NP 09/15/14 2105

## 2014-10-09 ENCOUNTER — Other Ambulatory Visit: Payer: Self-pay | Admitting: Internal Medicine

## 2014-10-09 DIAGNOSIS — Z1231 Encounter for screening mammogram for malignant neoplasm of breast: Secondary | ICD-10-CM

## 2014-10-21 ENCOUNTER — Emergency Department (INDEPENDENT_AMBULATORY_CARE_PROVIDER_SITE_OTHER): Payer: Medicaid Other

## 2014-10-21 ENCOUNTER — Emergency Department (HOSPITAL_COMMUNITY)
Admission: EM | Admit: 2014-10-21 | Discharge: 2014-10-21 | Disposition: A | Discharge status: Home / Self Care | Payer: Medicaid Other | Source: Home / Self Care | Attending: Family Medicine | Admitting: Family Medicine

## 2014-10-21 ENCOUNTER — Encounter (HOSPITAL_COMMUNITY): Payer: Self-pay | Admitting: Emergency Medicine

## 2014-10-21 DIAGNOSIS — S90852A Superficial foreign body, left foot, initial encounter: Secondary | ICD-10-CM

## 2014-10-21 NOTE — ED Provider Notes (Addendum)
CSN: [id number]     Arrival date & time 10/21/14  1300 History   First MD Initiated Contact with Patient 10/21/14 1309     Chief Complaint  Patient presents with  . Foreign Body   (Consider location/radiation/quality/duration/timing/severity/associated sxs/prior Treatment) Patient is a 59 y.o. female presenting with foreign body. The history is provided by the patient.  Foreign Body Location:  Skin Suspected object:  Glass (pulled piece of glass from left heel 2 mos ago , still with pain and concern that still in skin) Pain severity:  Mild Duration:  8 weeks Progression:  Unchanged Chronicity:  Chronic Worsened by:  Nothing tried Ineffective treatments:  None tried Risk factors comment:  Diabetic, pt is utd on tetanus status.   Past Medical History  Diagnosis Date  . Hypertension   . Diabetes mellitus   . Hyperlipidemia    History reviewed. No pertinent past surgical history. Family History  Problem Relation Age of Onset  . Hypertension Mother   . Diabetes Mother   . COPD Father   . Heart disease Father   . Diabetes Father    History  Substance Use Topics  . Smoking status: Current Every Day Smoker  . Smokeless tobacco: Not on file  . Alcohol Use: No   OB History    No data available     Review of Systems  Constitutional: Negative.   Musculoskeletal: Negative for gait problem.  Skin: Negative for wound.    Allergies  Penicillins  Home Medications   Prior to Admission medications   Medication Sig Start Date End Date Taking? Authorizing Provider  bisacodyl (FLEET) 10 MG/30ML ENEM Place 30 mLs (10 mg total) rectally once. 08/27/14   Melony Overly, MD  glucose blood test strip Use as instructed 12/11/13   Lance Bosch, NP  glucose monitoring kit (FREESTYLE) monitoring kit 1 each by Does not apply route as needed for other. 12/03/13   Lance Bosch, NP  glucose monitoring kit (FREESTYLE) monitoring kit 1 each by Does not apply route as needed for other. Check  blood sugar TID & QHS 12/04/13   Lance Bosch, NP  ibuprofen (ADVIL,MOTRIN) 200 MG tablet Take 800 mg by mouth every 6 (six) hours as needed. For pain    Historical Provider, MD  insulin glargine (LANTUS) 100 UNIT/ML injection Inject 0.4 mLs (40 Units total) into the skin at bedtime. 06/25/14   Lance Bosch, NP  Insulin Pen Needle 31G X 8 MM MISC Use lantus nightly for E11.9 06/25/14   Lance Bosch, NP  Lancets (FREESTYLE) lancets Use as instructed 12/03/13   Lance Bosch, NP  lisinopril (PRINIVIL,ZESTRIL) 10 MG tablet TAKE 1 TABLET EVERY DAY 06/25/14   Lance Bosch, NP  magnesium hydroxide (MILK OF MAGNESIA) 400 MG/5ML suspension Take by mouth daily as needed for mild constipation.    Historical Provider, MD  metFORMIN (GLUCOPHAGE) 1000 MG tablet Take 1 tablet (1,000 mg total) by mouth 2 (two) times daily with a meal. 06/25/14   Lance Bosch, NP  omeprazole (PRILOSEC) 20 MG capsule Take 1 capsule (20 mg total) by mouth daily. 09/15/14   Janne Napoleon, NP  ranitidine (ZANTAC) 150 MG capsule Take 1 capsule (150 mg total) by mouth 2 (two) times daily. 09/15/14   Janne Napoleon, NP  simvastatin (ZOCOR) 40 MG tablet Take 1 tablet (40 mg total) by mouth daily at 6 PM. 06/25/14   Lance Bosch, NP  sucralfate (CARAFATE) 1 G tablet Take  1 tablet (1 g total) by mouth 4 (four) times daily -  with meals and at bedtime. 09/15/14   Janne Napoleon, NP   BP 140/79 mmHg  Pulse 96  Temp(Src) 98.3 F (36.8 C) (Oral)  Resp 20  SpO2 100% Physical Exam  Constitutional: She is oriented to person, place, and time. She appears well-developed and well-nourished. No distress.  Neurological: She is alert and oriented to person, place, and time.  Skin: Skin is warm and dry.  No visible or palp fb or infection in sole of left foot.  Nursing note and vitals reviewed.   ED Course  Procedures (including critical care time) Labs Review Labs Reviewed - No data to display  Imaging Review Dg Os Calcis Left  10/21/2014    CLINICAL DATA:  Initial encounter for stepped on glass several months ago with heel pain now  EXAM: LEFT OS CALCIS - 2+ VIEW  COMPARISON:  None.  FINDINGS: There is no evidence of fracture or other focal bone lesions. Soft tissues are unremarkable. No evidence for radiopaque soft tissue foreign  IMPRESSION: Negative.   Electronically Signed   By: Misty Stanley M.D.   On: 10/21/2014 13:33   X-rays reviewed and report per radiologist.   MDM   1. Foreign body in left foot, initial encounter        Billy Fischer, MD 10/21/14 1403  Billy Fischer, MD 10/21/14 [phone]

## 2014-10-21 NOTE — Discharge Instructions (Signed)
Soak foot in warm salt water at night, call podiatrist if further problems.

## 2014-10-21 NOTE — ED Notes (Signed)
Reports pulling a shard of glass from left heel approx 1 month ago.  Since then notices pain with walking

## 2014-11-09 ENCOUNTER — Ambulatory Visit (HOSPITAL_COMMUNITY)
Admission: RE | Admit: 2014-11-09 | Discharge: 2014-11-09 | Disposition: A | Discharge status: Home / Self Care | Payer: Medicaid Other | Source: Ambulatory Visit | Attending: Internal Medicine | Admitting: Internal Medicine

## 2014-11-09 ENCOUNTER — Other Ambulatory Visit: Payer: Self-pay | Admitting: Internal Medicine

## 2014-11-09 DIAGNOSIS — Z1231 Encounter for screening mammogram for malignant neoplasm of breast: Secondary | ICD-10-CM | POA: Diagnosis not present

## 2014-11-12 ENCOUNTER — Telehealth: Payer: Self-pay

## 2014-11-12 NOTE — Telephone Encounter (Signed)
-----   Message from Quentin Angstlugbemiga E Jegede, MD sent at 11/11/2014 12:03 PM EDT ----- Please inform patient that her screening mammogram shows no evidence of malignancy. Recommend screening mammogram in one year.

## 2014-11-12 NOTE — Telephone Encounter (Signed)
Nurse called patient, patient verified date of birth. Patient aware of screening mammogram showing no evidence of malignancy and agrees to have screening mammogram in one year. Patient voices understanding and has no further questions at this time.

## 2014-12-03 ENCOUNTER — Other Ambulatory Visit: Payer: Self-pay | Admitting: Internal Medicine

## 2014-12-10 NOTE — Telephone Encounter (Signed)
Pt is requesting a refill or sample for lisinopril (PRINIVIL,ZESTRIL) 10 MG tablet. She has an appoitnemt next week and would like to use [facility]. Please follow up with pt. Thank you.

## 2014-12-14 ENCOUNTER — Telehealth: Payer: Self-pay | Admitting: Internal Medicine

## 2014-12-14 NOTE — Telephone Encounter (Signed)
Pt. Is completely out of her lisinopril..... Please f/u with patient

## 2014-12-14 NOTE — Telephone Encounter (Signed)
Patient called to request a med refill on lisinopril (PRINIVIL,ZESTRIL) 10 MG tablet. Please f/u with pt.

## 2014-12-15 NOTE — Telephone Encounter (Signed)
Patient called to request a med refill for Lisinopril, please f/u with pt. °

## 2014-12-17 ENCOUNTER — Ambulatory Visit: Payer: Medicaid Other | Attending: Internal Medicine | Admitting: Internal Medicine

## 2014-12-17 ENCOUNTER — Encounter (INDEPENDENT_AMBULATORY_CARE_PROVIDER_SITE_OTHER): Payer: Self-pay

## 2014-12-17 ENCOUNTER — Encounter: Payer: Self-pay | Admitting: Internal Medicine

## 2014-12-17 VITALS — BP 119/72 | HR 82 | Temp 98.1°F | Resp 18 | Ht 71.0 in | Wt 201.0 lb

## 2014-12-17 DIAGNOSIS — E119 Type 2 diabetes mellitus without complications: Secondary | ICD-10-CM | POA: Insufficient documentation

## 2014-12-17 DIAGNOSIS — Z794 Long term (current) use of insulin: Secondary | ICD-10-CM | POA: Diagnosis not present

## 2014-12-17 DIAGNOSIS — Z720 Tobacco use: Secondary | ICD-10-CM | POA: Insufficient documentation

## 2014-12-17 DIAGNOSIS — I10 Essential (primary) hypertension: Secondary | ICD-10-CM | POA: Insufficient documentation

## 2014-12-17 DIAGNOSIS — E785 Hyperlipidemia, unspecified: Secondary | ICD-10-CM | POA: Diagnosis not present

## 2014-12-17 LAB — POCT GLYCOSYLATED HEMOGLOBIN (HGB A1C): HEMOGLOBIN A1C: 7.9

## 2014-12-17 LAB — GLUCOSE, POCT (MANUAL RESULT ENTRY): POC GLUCOSE: 130 mg/dL — AB (ref 70–99)

## 2014-12-17 MED ORDER — SIMVASTATIN 40 MG PO TABS: 40.0000 mg | ORAL_TABLET | Freq: Every day | ORAL | Status: DC

## 2014-12-17 MED ORDER — OMEPRAZOLE 20 MG PO CPDR: 20.0000 mg | DELAYED_RELEASE_CAPSULE | Freq: Every day | ORAL | Status: DC

## 2014-12-17 MED ORDER — LISINOPRIL 10 MG PO TABS: ORAL_TABLET | ORAL | Status: DC

## 2014-12-17 MED ORDER — METFORMIN HCL 1000 MG PO TABS: 1000.0000 mg | ORAL_TABLET | Freq: Two times a day (BID) | ORAL | Status: DC

## 2014-12-17 NOTE — Progress Notes (Signed)
Patient ID: Stephanie Neal, female   DOB: 1955/05/05, 59 y.o.   MRN: [id number] SUBJECTIVE: 59 y.o. female for follow up of diabetes and HTN. Diabetic Review of Systems - medication compliance: compliant all of the time, diabetic diet compliance: noncompliant some of the time, home glucose monitoring: is performed sporadically.  Other symptoms and concerns: Last pap smear was at pap smear 4 years ago.   Current Outpatient Prescriptions  Medication Sig Dispense Refill  . bisacodyl (FLEET) 10 MG/30ML ENEM Place 30 mLs (10 mg total) rectally once. 30 mL 0  . glucose blood test strip Use as instructed 100 each 12  . glucose monitoring kit (FREESTYLE) monitoring kit 1 each by Does not apply route as needed for other. 1 each 0  . glucose monitoring kit (FREESTYLE) monitoring kit 1 each by Does not apply route as needed for other. Check blood sugar TID & QHS 1 each 0  . ibuprofen (ADVIL,MOTRIN) 200 MG tablet Take 800 mg by mouth every 6 (six) hours as needed. For pain    . insulin glargine (LANTUS) 100 UNIT/ML injection Inject 0.4 mLs (40 Units total) into the skin at bedtime. 10 mL 4  . Insulin Pen Needle 31G X 8 MM MISC Use lantus nightly for E11.9 90 each 5  . Lancets (FREESTYLE) lancets Use as instructed 100 each 12  . lisinopril (PRINIVIL,ZESTRIL) 10 MG tablet TAKE 1 TABLET EVERY DAY 30 tablet 5  . magnesium hydroxide (MILK OF MAGNESIA) 400 MG/5ML suspension Take by mouth daily as needed for mild constipation.    . metFORMIN (GLUCOPHAGE) 1000 MG tablet Take 1 tablet (1,000 mg total) by mouth 2 (two) times daily with a meal. 60 tablet 4  . omeprazole (PRILOSEC) 20 MG capsule TAKE ONE CAPSULE BY MOUTH EVERY DAY 30 capsule 0  . simvastatin (ZOCOR) 40 MG tablet Take 1 tablet (40 mg total) by mouth daily at 6 PM. 30 tablet 4  . ranitidine (ZANTAC) 150 MG capsule Take 1 capsule (150 mg total) by mouth 2 (two) times daily. (Patient not taking: Reported on 12/17/2014) 30 capsule 0  . sucralfate (CARAFATE) 1 G  tablet Take 1 tablet (1 g total) by mouth 4 (four) times daily -  with meals and at bedtime. (Patient not taking: Reported on 12/17/2014) 120 tablet 0  . [DISCONTINUED] loratadine (CLARITIN) 10 MG tablet Take 1 tablet (10 mg total) by mouth daily. (Patient not taking: Reported on 06/25/2014) 30 tablet 11   No current facility-administered medications for this visit.    OBJECTIVE: Appearance: alert, well appearing, and in no distress, oriented to person, place, and time and normal appearing weight. BP 119/72 mmHg  Pulse 82  Temp(Src) 98.1 F (36.7 C) (Oral)  Resp 18  Ht $R'5\' 11"'eX$  (1.803 m)  Wt 201 lb (91.173 kg)  BMI 28.05 kg/m2  SpO2 96%  Exam: heart sounds normal rate, regular rhythm, normal S1, S2, no murmurs, rubs, clicks or gallops, no JVD, no carotid bruits, feet: warm, good capillary refill, no trophic changes or ulcerative lesions and normal DP and PT pulses  ASSESSMENT: Diabetes Mellitus: stable, needs improvement and needs to follow diet more regularly Hypertension well controlled and needs to follow diet more regularly.   PLAN: Current treatment plan is effective, no change in therapy. Reviewed diet, exercise and weight control. Recommended sodium restriction. Cardiovascular risk and specific lipid/LDL goals reviewed. Specific diabetic recommendations: diabetic diet discussed in detail, written exchange diet given, low cholesterol diet, weight control and daily exercise discussed, home glucose  monitoring emphasized and foot care discussed and Podiatry visits discussed. Use of aspirin to prevent MI and TIA's discussed..   Return in about 3 months (around 03/19/2015) for DM/HTN.  Lance Bosch, NP 12/24/2014 6:26 PM

## 2014-12-17 NOTE — Progress Notes (Signed)
Pt's here for DM and HTN F/up. No pain reported today. Pt reports eating breakfast.. Water, Bologna, 2 wheat bread.  Refill of Lisinopril, flex pen, medformin.   Pt prefer to have Lisinopril filled here@ [facility] .

## 2014-12-18 NOTE — Telephone Encounter (Signed)
Nurse called patient to make patient aware of [facility]isinopril sent to [facility] [address] per patients request from message left on nurses voicemail.  Left message on voicemail for patient to return call to [facility] with [facility] at [phone].

## 2015-01-09 ENCOUNTER — Other Ambulatory Visit: Payer: Self-pay | Admitting: Internal Medicine

## 2015-02-25 ENCOUNTER — Telehealth: Payer: Self-pay | Admitting: Internal Medicine

## 2015-02-25 NOTE — Telephone Encounter (Signed)
Pt. Called stating that paperwork for her DM supplies was faxed back in August for her PCP to fill out. Pt. Stated she received a call from [place] [facility] stating they have not received the paperwork.  Please f/u  [place] [phone]- [phone]

## 2015-03-02 ENCOUNTER — Telehealth: Payer: Self-pay

## 2015-03-02 NOTE — Telephone Encounter (Signed)
No. What was the paperwork for? If it was diabetic supplies that was sent off along time ago.

## 2015-03-02 NOTE — Telephone Encounter (Signed)
Do you have paper work for this patient It was supposedly faxed to you in august

## 2015-04-19 ENCOUNTER — Ambulatory Visit: Payer: Medicaid Other | Attending: Internal Medicine | Admitting: Internal Medicine

## 2015-04-19 ENCOUNTER — Encounter: Payer: Self-pay | Admitting: Internal Medicine

## 2015-04-19 VITALS — BP 137/74 | HR 76 | Temp 98.0°F | Resp 16 | Ht 71.0 in | Wt 195.2 lb

## 2015-04-19 DIAGNOSIS — E119 Type 2 diabetes mellitus without complications: Secondary | ICD-10-CM | POA: Diagnosis not present

## 2015-04-19 DIAGNOSIS — Z794 Long term (current) use of insulin: Secondary | ICD-10-CM | POA: Diagnosis not present

## 2015-04-19 DIAGNOSIS — J309 Allergic rhinitis, unspecified: Secondary | ICD-10-CM | POA: Diagnosis not present

## 2015-04-19 DIAGNOSIS — I10 Essential (primary) hypertension: Secondary | ICD-10-CM | POA: Diagnosis not present

## 2015-04-19 DIAGNOSIS — Z7984 Long term (current) use of oral hypoglycemic drugs: Secondary | ICD-10-CM | POA: Insufficient documentation

## 2015-04-19 DIAGNOSIS — E785 Hyperlipidemia, unspecified: Secondary | ICD-10-CM | POA: Diagnosis not present

## 2015-04-19 DIAGNOSIS — Z79899 Other long term (current) drug therapy: Secondary | ICD-10-CM | POA: Diagnosis not present

## 2015-04-19 LAB — POCT GLYCOSYLATED HEMOGLOBIN (HGB A1C): HEMOGLOBIN A1C: 7.1

## 2015-04-19 LAB — GLUCOSE, POCT (MANUAL RESULT ENTRY): POC GLUCOSE: 79 mg/dL (ref 70–99)

## 2015-04-19 MED ORDER — METFORMIN HCL 1000 MG PO TABS: 1000.0000 mg | ORAL_TABLET | Freq: Two times a day (BID) | ORAL | Status: DC

## 2015-04-19 MED ORDER — PSEUDOEPHEDRINE-GUAIFENESIN ER 120-1200 MG PO TB12: 1.0000 | ORAL_TABLET | Freq: Two times a day (BID) | ORAL | Status: DC | PRN

## 2015-04-19 MED ORDER — INSULIN GLARGINE 100 UNIT/ML SOLOSTAR PEN: 36.0000 [IU] | PEN_INJECTOR | Freq: Every day | SUBCUTANEOUS | Status: DC

## 2015-04-19 MED ORDER — SIMVASTATIN 40 MG PO TABS: 40.0000 mg | ORAL_TABLET | Freq: Every day | ORAL | Status: DC

## 2015-04-19 MED ORDER — INSULIN PEN NEEDLE 31G X 8 MM MISC: Status: DC

## 2015-04-19 MED ORDER — LISINOPRIL 10 MG PO TABS: ORAL_TABLET | ORAL | Status: DC

## 2015-04-19 MED ORDER — OMEPRAZOLE 20 MG PO CPDR: 20.0000 mg | DELAYED_RELEASE_CAPSULE | Freq: Every day | ORAL | Status: DC

## 2015-04-19 MED ORDER — FLUTICASONE PROPIONATE 50 MCG/ACT NA SUSP: 2.0000 | Freq: Every day | NASAL | Status: DC

## 2015-04-19 NOTE — Progress Notes (Signed)
Patient here for follow up on her diabetes Patient also complains of having cold like symptoms Congestion runny eyes cough and some discomfort to her right ear

## 2015-04-19 NOTE — Progress Notes (Signed)
Patient ID: Stephanie Neal, female   DOB: 05-07-1955, 59 y.o.   MRN: [id number] SUBJECTIVE: 59 y.o. female for follow up of diabetes. Diabetic Review of Systems - medication compliance: compliant all of the time, diabetic diet compliance: compliant all of the time, home glucose monitoring: is performed sporadically, further diabetic ROS: no polyuria or polydipsia, no chest pain, dyspnea or TIA's, no numbness, tingling or pain in extremities, no unusual visual symptoms, no hypoglycemia.  Other symptoms and concerns: Symptoms of congestion, rhinitis, ear fullness and itching for the past 1 week. She states that she has been having sneezing and irritated throat. She has not tried anything for symptom management.  Current Outpatient Prescriptions  Medication Sig Dispense Refill  . insulin glargine (LANTUS) 100 UNIT/ML injection Inject 0.4 mLs (40 Units total) into the skin at bedtime. 10 mL 4  . lisinopril (PRINIVIL,ZESTRIL) 10 MG tablet TAKE 1 TABLET EVERY DAY--blood pressure 30 tablet 5  . metFORMIN (GLUCOPHAGE) 1000 MG tablet Take 1 tablet (1,000 mg total) by mouth 2 (two) times daily with a meal. For diabetes 60 tablet 4  . omeprazole (PRILOSEC) 20 MG capsule Take 1 capsule (20 mg total) by mouth daily. For acid reflux 30 capsule 0  . simvastatin (ZOCOR) 40 MG tablet Take 1 tablet (40 mg total) by mouth daily at 6 PM. For cholesterol 30 tablet 4  . bisacodyl (FLEET) 10 MG/30ML ENEM Place 30 mLs (10 mg total) rectally once. 30 mL 0  . glucose blood test strip Use as instructed 100 each 12  . glucose monitoring kit (FREESTYLE) monitoring kit 1 each by Does not apply route as needed for other. 1 each 0  . glucose monitoring kit (FREESTYLE) monitoring kit 1 each by Does not apply route as needed for other. Check blood sugar TID & QHS 1 each 0  . ibuprofen (ADVIL,MOTRIN) 200 MG tablet Take 800 mg by mouth every 6 (six) hours as needed. For pain    . Insulin Pen Needle 31G X 8 MM MISC Use lantus nightly for  E11.9 90 each 5  . Lancets (FREESTYLE) lancets Use as instructed 100 each 12  . omeprazole (PRILOSEC) 20 MG capsule TAKE ONE CAPSULE BY MOUTH EVERY DAY 30 capsule 0  . [DISCONTINUED] loratadine (CLARITIN) 10 MG tablet Take 1 tablet (10 mg total) by mouth daily. (Patient not taking: Reported on 06/25/2014) 30 tablet 11   No current facility-administered medications for this visit.    OBJECTIVE: Appearance: alert, well appearing, and in no distress, oriented to person, place, and time and normal appearing weight. BP 137/74 mmHg  Pulse 76  Temp(Src) 98 F (36.7 C)  Resp 16  Ht '5\' 11"'$  (1.803 m)  Wt 195 lb 3.2 oz (88.542 kg)  BMI 27.24 kg/m2  SpO2 100%  Physical Exam  Constitutional: She is oriented to person, place, and time.  HENT:  Mouth/Throat: Oropharynx is clear and moist.  Bilateral swollen nasal turbinates Bilateral effusions  Eyes: Right eye exhibits no discharge. Left eye exhibits no discharge.  Cardiovascular: Normal rate and regular rhythm.   Musculoskeletal: She exhibits no edema.  Lymphadenopathy:    She has no cervical adenopathy.  Neurological: She is alert and oriented to person, place, and time.  Psychiatric: She has a normal mood and affect.     ASSESSMENT: Violeta was seen today for follow-up.  Diagnoses and all orders for this visit:  Type 2 diabetes mellitus without complication, with long-term current use of insulin (HCC) -     Glucose (  CBG) -     HgB A1c -     Insulin Glargine (LANTUS SOLOSTAR) 100 UNIT/ML Solostar Pen; Inject 36 Units into the skin daily at 10 pm. -     metFORMIN (GLUCOPHAGE) 1000 MG tablet; Take 1 tablet (1,000 mg total) by mouth 2 (two) times daily with a meal. For diabetes -     lisinopril (PRINIVIL,ZESTRIL) 10 MG tablet; TAKE 1 TABLET EVERY DAY--blood pressure -     Insulin Pen Needle 31G X 8 MM MISC; Use lantus nightly for E11.9 Patients diabetes is well control as evidence by consistently low a1c.  Patient will continue with  current therapy and continue to make necessary lifestyle changes.  Reviewed foot care, diet, exercise, annual health maintenance with patient.   Essential hypertension -     lisinopril (PRINIVIL,ZESTRIL) 10 MG tablet; TAKE 1 TABLET EVERY DAY--blood pressure -     Basic Metabolic Panel; Future Patient blood pressure is stable and may continue on current medication.  Education on diet, exercise, and modifiable risk factors discussed. Will obtain appropriate labs as needed. Will follow up in 3-6 months.   HLD (hyperlipidemia) -     simvastatin (ZOCOR) 40 MG tablet; Take 1 tablet (40 mg total) by mouth daily at 6 PM. For cholesterol -     Lipid panel; Future Education provided on proper lifestyle changes in order to lower cholesterol. Patient advised to maintain healthy weight and to keep total fat intake at 25-35% of total calories and carbohydrates 50-60% of total daily calories. Explained how high cholesterol places patient at risk for heart disease. Patient placed on appropriate medication and repeat labs in 6 months   Allergic rhinitis, unspecified allergic rhinitis type -     Pseudoephedrine-Guaifenesin [phone] MG TB12; Take 1 tablet by mouth 2 (two) times daily as needed. -     fluticasone (FLONASE) 50 MCG/ACT nasal spray; Place 2 sprays into both nostrils daily.   Return in about 1 week (around 04/26/2015) for Lab Visit and 6 mo PCP .   Lance Bosch, NP 04/19/2015 4:10 PM

## 2015-05-16 ENCOUNTER — Emergency Department (INDEPENDENT_AMBULATORY_CARE_PROVIDER_SITE_OTHER)
Admission: EM | Admit: 2015-05-16 | Discharge: 2015-05-16 | Disposition: A | Discharge status: Home / Self Care | Payer: Medicaid Other | Source: Home / Self Care | Attending: Family Medicine | Admitting: Family Medicine

## 2015-05-16 ENCOUNTER — Encounter (HOSPITAL_COMMUNITY): Payer: Self-pay

## 2015-05-16 DIAGNOSIS — H6091 Unspecified otitis externa, right ear: Secondary | ICD-10-CM

## 2015-05-16 MED ORDER — CIPROFLOXACIN-DEXAMETHASONE 0.3-0.1 % OT SUSP: 4.0000 [drp] | Freq: Two times a day (BID) | OTIC | Status: AC

## 2015-05-16 NOTE — ED Notes (Signed)
Patient complains of having right ear pain and discomfort Pain started about two weeks ago

## 2015-05-16 NOTE — Discharge Instructions (Signed)
Otitis Externa Otitis externa is a germ infection in the outer ear. The outer ear is the area from the eardrum to the outside of the ear. Otitis externa is sometimes called "swimmer's ear." HOME CARE  Put drops in the ear as told by your doctor.  Only take medicine as told by your doctor.  If you have diabetes, your doctor may give you more directions. Follow your doctor's directions.  Keep all doctor visits as told. To avoid another infection:  Keep your ear dry. Use the corner of a towel to dry your ear after swimming or bathing.  Avoid scratching or putting things inside your ear.  Avoid swimming in lakes, dirty water, or pools that use a chemical called chlorine poorly.  You may use ear drops after swimming. Combine equal amounts of white vinegar and alcohol in a bottle. Put 3 or 4 drops in each ear. GET HELP IF:   You have a fever.  Your ear is still red, puffy (swollen), or painful after 3 days.  You still have yellowish-white fluid (pus) coming from the ear after 3 days.  Your redness, puffiness, or pain gets worse.  You have a really bad headache.  You have redness, puffiness, pain, or tenderness behind your ear. MAKE SURE YOU:   Understand these instructions.  Will watch your condition.  Will get help right away if you are not doing well or get worse.   This information is not intended to replace advice given to you by your health care provider. Make sure you discuss any questions you have with your health care provider.   Document Released: 10/04/2007 Document Revised: 05/08/2014 Document Reviewed: 05/04/2011 Elsevier Interactive Patient Education 2016 [employer]  

## 2015-05-16 NOTE — ED Provider Notes (Signed)
CSN: [id number]     Arrival date & time 05/16/15  1339 History   First MD Initiated Contact with Patient 05/16/15 1536     Chief Complaint  Patient presents with  . Otalgia   (Consider location/radiation/quality/duration/timing/severity/associated sxs/prior Treatment) Patient is a 60 y.o. female presenting with ear pain. The history is provided by the patient. No language interpreter was used.  Otalgia Location:  Right Behind ear:  No abnormality Quality:  Aching Severity:  Moderate Onset quality:  Gradual Duration:  2 weeks Timing:  Constant Progression:  Improving Chronicity:  New Context: not direct blow, not foreign body in ear and no water in ear   Relieved by:  OTC medications (She used peroxide cleanser which helped some) Exacerbated by: Pressure on her right ear. Ineffective treatments:  None tried Associated symptoms: congestion and ear discharge   Associated symptoms: no cough, no fever, no headaches, no hearing loss, no sore throat and no vomiting   Associated symptoms comment:  She had chills Risk factors: no recent travel, no chronic ear infection and no prior ear surgery     Past Medical History  Diagnosis Date  . Hypertension   . Diabetes mellitus   . Hyperlipidemia    History reviewed. No pertinent past surgical history. Family History  Problem Relation Age of Onset  . Hypertension Mother   . Diabetes Mother   . COPD Father   . Heart disease Father   . Diabetes Father    Social History  Substance Use Topics  . Smoking status: Current Every Day Smoker -- 0.50 packs/day    Types: Cigarettes  . Smokeless tobacco: None  . Alcohol Use: No   OB History    No data available     Review of Systems  Constitutional: Negative for fever.  HENT: Positive for congestion, ear discharge and ear pain. Negative for hearing loss and sore throat.   Respiratory: Negative.  Negative for cough.   Cardiovascular: Negative.   Gastrointestinal: Negative.  Negative for  vomiting.  Genitourinary: Negative.   Neurological: Negative for headaches.  All other systems reviewed and are negative.   Allergies  Penicillins  Home Medications   Prior to Admission medications   Medication Sig Start Date End Date Taking? Authorizing Provider  bisacodyl (FLEET) 10 MG/30ML ENEM Place 30 mLs (10 mg total) rectally once. 08/27/14   Melony Overly, MD  fluticasone (FLONASE) 50 MCG/ACT nasal spray Place 2 sprays into both nostrils daily. 04/19/15   Lance Bosch, NP  glucose blood test strip Use as instructed 12/11/13   Lance Bosch, NP  glucose monitoring kit (FREESTYLE) monitoring kit 1 each by Does not apply route as needed for other. 12/03/13   Lance Bosch, NP  glucose monitoring kit (FREESTYLE) monitoring kit 1 each by Does not apply route as needed for other. Check blood sugar TID & QHS 12/04/13   Lance Bosch, NP  ibuprofen (ADVIL,MOTRIN) 200 MG tablet Take 800 mg by mouth every 6 (six) hours as needed. For pain    Historical Provider, MD  Insulin Glargine (LANTUS SOLOSTAR) 100 UNIT/ML Solostar Pen Inject 36 Units into the skin daily at 10 pm. 04/19/15   Lance Bosch, NP  Insulin Pen Needle 31G X 8 MM MISC Use lantus nightly for E11.9 04/19/15   Lance Bosch, NP  Lancets (FREESTYLE) lancets Use as instructed 12/03/13   Lance Bosch, NP  lisinopril (PRINIVIL,ZESTRIL) 10 MG tablet TAKE 1 TABLET EVERY DAY--blood pressure 04/19/15  Lance Bosch, NP  metFORMIN (GLUCOPHAGE) 1000 MG tablet Take 1 tablet (1,000 mg total) by mouth 2 (two) times daily with a meal. For diabetes 04/19/15   Lance Bosch, NP  omeprazole (PRILOSEC) 20 MG capsule Take 1 capsule (20 mg total) by mouth daily. 04/19/15   Lance Bosch, NP  Pseudoephedrine-Guaifenesin [phone] MG TB12 Take 1 tablet by mouth 2 (two) times daily as needed. 04/19/15   Lance Bosch, NP  simvastatin (ZOCOR) 40 MG tablet Take 1 tablet (40 mg total) by mouth daily at 6 PM. For cholesterol 04/19/15   Lance Bosch,  NP   Meds Ordered and Administered this Visit  Medications - No data to display  BP 161/69 mmHg  Pulse 99  Temp(Src) 98.2 F (36.8 C) (Oral)  SpO2 100% No data found.   Physical Exam  Constitutional: She appears well-developed. No distress.  HENT:  Right Ear: There is tenderness. No drainage or swelling.  Left Ear: Tympanic membrane, external ear and ear canal normal. No drainage, swelling or tenderness.  Ears:  Cardiovascular: Normal rate, regular rhythm, normal heart sounds and intact distal pulses.   No murmur heard. Pulmonary/Chest: Effort normal and breath sounds normal. No respiratory distress. She has no wheezes.  Nursing note and vitals reviewed.   ED Course  Procedures (including critical care time)  Labs Review Labs Reviewed - No data to display  Imaging Review No results found.   Visual Acuity Review  Right Eye Distance:   Left Eye Distance:   Bilateral Distance:    Right Eye Near:   Left Eye Near:    Bilateral Near:         MDM  No diagnosis found. Otitis externa, right  Patient advised to avoid further irritating her right ear with peroxide and other agents. Ciprodex prescribed for ear infection. May use Ibuprofen or tylenol prn pain. Return precaution discussed.    Kinnie Feil, MD 05/16/15 [phone]

## 2015-06-08 MED FILL — metFORMIN HCL 1000 MG TABS: 1000 | 30 days supply | Qty: 60 | Fill #3

## 2015-06-09 ENCOUNTER — Ambulatory Visit: Payer: Medicaid Other | Attending: Internal Medicine

## 2015-06-09 DIAGNOSIS — E785 Hyperlipidemia, unspecified: Secondary | ICD-10-CM

## 2015-06-09 DIAGNOSIS — I10 Essential (primary) hypertension: Secondary | ICD-10-CM

## 2015-06-09 LAB — LIPID PANEL
CHOLESTEROL: 241 mg/dL — AB (ref 125–200)
HDL: 43 mg/dL — AB (ref >=46)
LDL CALC: 176 mg/dL — AB (ref <130)
TRIGLYCERIDES: 110 mg/dL (ref <150)
Total CHOL/HDL Ratio: 5.6 Ratio — ABNORMAL HIGH (ref <=5.0)
VLDL: 22 mg/dL (ref <30)

## 2015-06-09 LAB — BASIC METABOLIC PANEL
BUN: 11 mg/dL (ref 7–25)
CALCIUM: 8.6 mg/dL (ref 8.6–10.4)
CHLORIDE: 106 mmol/L (ref 98–110)
CO2: 27 mmol/L (ref 20–31)
CREATININE: 0.92 mg/dL (ref 0.50–0.99)
GLUCOSE: 96 mg/dL (ref 65–99)
Potassium: 4.2 mmol/L (ref 3.5–5.3)
Sodium: 141 mmol/L (ref 135–146)

## 2015-06-16 ENCOUNTER — Telehealth: Payer: Self-pay

## 2015-06-16 MED ORDER — ATORVASTATIN CALCIUM 40 MG PO TABS: 40.0000 mg | ORAL_TABLET | Freq: Every day | ORAL | Status: DC

## 2015-06-16 NOTE — Telephone Encounter (Signed)
Spoke with patient this am and she is aware of her lab results and to  [facility]

## 2015-06-16 NOTE — Telephone Encounter (Signed)
-----   Message from Ambrose Finland, NP sent at 06/15/2015  4:44 PM EST ----- Cholesterol is really high. Numbers have not changed. Please d/c simvastatin and switch her to Atorvastatin 40 mg and have her come back in 3 months for a repeat lipid panel---place future order.

## 2015-08-04 ENCOUNTER — Emergency Department (HOSPITAL_COMMUNITY): Payer: Medicaid Other

## 2015-08-04 ENCOUNTER — Encounter (HOSPITAL_COMMUNITY): Payer: Self-pay

## 2015-08-04 ENCOUNTER — Emergency Department (HOSPITAL_COMMUNITY)
Admission: EM | Admit: 2015-08-04 | Discharge: 2015-08-04 | Disposition: A | Discharge status: Home / Self Care | Payer: Medicaid Other | Attending: Emergency Medicine | Admitting: Emergency Medicine

## 2015-08-04 DIAGNOSIS — Y998 Other external cause status: Secondary | ICD-10-CM | POA: Insufficient documentation

## 2015-08-04 DIAGNOSIS — E785 Hyperlipidemia, unspecified: Secondary | ICD-10-CM | POA: Diagnosis not present

## 2015-08-04 DIAGNOSIS — I10 Essential (primary) hypertension: Secondary | ICD-10-CM | POA: Diagnosis not present

## 2015-08-04 DIAGNOSIS — F17210 Nicotine dependence, cigarettes, uncomplicated: Secondary | ICD-10-CM | POA: Diagnosis not present

## 2015-08-04 DIAGNOSIS — S4992XA Unspecified injury of left shoulder and upper arm, initial encounter: Secondary | ICD-10-CM | POA: Diagnosis not present

## 2015-08-04 DIAGNOSIS — Z794 Long term (current) use of insulin: Secondary | ICD-10-CM | POA: Diagnosis not present

## 2015-08-04 DIAGNOSIS — Z880 Allergy status to penicillin: Secondary | ICD-10-CM | POA: Insufficient documentation

## 2015-08-04 DIAGNOSIS — R202 Paresthesia of skin: Secondary | ICD-10-CM | POA: Insufficient documentation

## 2015-08-04 DIAGNOSIS — Y9389 Activity, other specified: Secondary | ICD-10-CM | POA: Insufficient documentation

## 2015-08-04 DIAGNOSIS — Z79899 Other long term (current) drug therapy: Secondary | ICD-10-CM | POA: Diagnosis not present

## 2015-08-04 DIAGNOSIS — S8992XA Unspecified injury of left lower leg, initial encounter: Secondary | ICD-10-CM | POA: Insufficient documentation

## 2015-08-04 DIAGNOSIS — Z7951 Long term (current) use of inhaled steroids: Secondary | ICD-10-CM | POA: Insufficient documentation

## 2015-08-04 DIAGNOSIS — Z7984 Long term (current) use of oral hypoglycemic drugs: Secondary | ICD-10-CM | POA: Insufficient documentation

## 2015-08-04 DIAGNOSIS — S59902A Unspecified injury of left elbow, initial encounter: Secondary | ICD-10-CM | POA: Diagnosis not present

## 2015-08-04 DIAGNOSIS — M25512 Pain in left shoulder: Secondary | ICD-10-CM

## 2015-08-04 DIAGNOSIS — E119 Type 2 diabetes mellitus without complications: Secondary | ICD-10-CM | POA: Insufficient documentation

## 2015-08-04 DIAGNOSIS — Y92410 Unspecified street and highway as the place of occurrence of the external cause: Secondary | ICD-10-CM | POA: Diagnosis not present

## 2015-08-04 DIAGNOSIS — M25522 Pain in left elbow: Secondary | ICD-10-CM

## 2015-08-04 MED ORDER — IBUPROFEN 600 MG PO TABS: 600.0000 mg | ORAL_TABLET | Freq: Four times a day (QID) | ORAL | Status: DC | PRN

## 2015-08-04 MED ORDER — IBUPROFEN 800 MG PO TABS
800.0000 mg | ORAL_TABLET | Freq: Once | ORAL | Status: AC
  Administered 2015-08-04: 800 mg via ORAL
  Filled 2015-08-04: qty 1

## 2015-08-04 MED ORDER — CYCLOBENZAPRINE HCL 5 MG PO TABS: 5.0000 mg | ORAL_TABLET | Freq: Three times a day (TID) | ORAL | Status: DC | PRN

## 2015-08-04 NOTE — Discharge Instructions (Signed)
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
## 2015-08-04 NOTE — ED Notes (Signed)
Patient was alert, oriented and stable upon discharge. RN went over AVS and patient had no further questions.  

## 2015-09-06 ENCOUNTER — Telehealth: Payer: Self-pay | Admitting: Internal Medicine

## 2015-09-06 NOTE — Telephone Encounter (Signed)
Pt. Called requesting medication refill on most of her medication. Please f/u

## 2015-09-23 ENCOUNTER — Ambulatory Visit (HOSPITAL_COMMUNITY)
Admission: EM | Admit: 2015-09-23 | Discharge: 2015-09-23 | Disposition: A | Discharge status: Home / Self Care | Payer: No Typology Code available for payment source | Attending: Family Medicine | Admitting: Family Medicine

## 2015-09-23 ENCOUNTER — Encounter (HOSPITAL_COMMUNITY): Payer: Self-pay | Admitting: *Deleted

## 2015-09-23 DIAGNOSIS — H9202 Otalgia, left ear: Secondary | ICD-10-CM

## 2015-09-23 DIAGNOSIS — M62838 Other muscle spasm: Secondary | ICD-10-CM | POA: Diagnosis not present

## 2015-09-23 MED ORDER — CYCLOBENZAPRINE HCL 5 MG PO TABS: 5.0000 mg | ORAL_TABLET | Freq: Three times a day (TID) | ORAL | Status: DC | PRN

## 2015-09-23 MED ORDER — BACITRACIN-POLYMYXIN B 500-10000 UNIT/GM EX OINT: TOPICAL_OINTMENT | Freq: Two times a day (BID) | CUTANEOUS | Status: DC
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
## 2015-09-23 NOTE — Discharge Instructions (Signed)
Muscle Cramps and Spasms Muscle cramps and spasms are when muscles tighten by themselves. They usually get better within minutes. Muscle cramps are painful. They are usually stronger and last longer than muscle spasms. Muscle spasms may or may not be painful. They can last a few seconds or much longer. HOME CARE  Drink enough fluid to keep your pee (urine) clear or pale yellow.  Massage, stretch, and relax the muscle.  Use a warm towel, heating pad, or warm shower water on tight muscles.  Place ice on the muscle if it is tender or in pain.  Put ice in a plastic bag.  Place a towel between your skin and the bag.  Leave the ice on for 15-20 minutes, 03-04 times a day.  Only take medicine as told by your doctor. GET HELP RIGHT AWAY IF:  Your cramps or spasms get worse, happen more often, or do not get better with time. MAKE SURE YOU:  Understand these instructions.  Will watch your condition.  Will get help right away if you are not doing well or get worse.   This information is not intended to replace advice given to you by your health care provider. Make sure you discuss any questions you have with your health care provider.   Document Released: 03/30/2008 Document Revised: 08/12/2012 Document Reviewed: 04/03/2012 Elsevier Interactive Patient Education 2016 [employer]  
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
## 2015-09-29 ENCOUNTER — Ambulatory Visit: Payer: Medicaid Other | Attending: Internal Medicine | Admitting: Internal Medicine

## 2015-09-29 ENCOUNTER — Other Ambulatory Visit: Payer: Self-pay | Admitting: Pharmacist

## 2015-09-29 ENCOUNTER — Encounter: Payer: Self-pay | Admitting: Internal Medicine

## 2015-09-29 VITALS — BP 148/80 | HR 91 | Temp 98.1°F | Wt 193.0 lb

## 2015-09-29 DIAGNOSIS — Z794 Long term (current) use of insulin: Secondary | ICD-10-CM | POA: Diagnosis not present

## 2015-09-29 DIAGNOSIS — H60392 Other infective otitis externa, left ear: Secondary | ICD-10-CM

## 2015-09-29 DIAGNOSIS — H6122 Impacted cerumen, left ear: Secondary | ICD-10-CM

## 2015-09-29 DIAGNOSIS — E119 Type 2 diabetes mellitus without complications: Secondary | ICD-10-CM | POA: Diagnosis not present

## 2015-09-29 DIAGNOSIS — I10 Essential (primary) hypertension: Secondary | ICD-10-CM

## 2015-09-29 DIAGNOSIS — E785 Hyperlipidemia, unspecified: Secondary | ICD-10-CM

## 2015-09-29 LAB — POCT GLYCOSYLATED HEMOGLOBIN (HGB A1C): HEMOGLOBIN A1C: 7.7

## 2015-09-29 LAB — GLUCOSE, POCT (MANUAL RESULT ENTRY): POC Glucose: 102 mg/dl — AB (ref 70–99)

## 2015-09-29 MED ORDER — CIPROFLOXACIN-HYDROCORTISONE 0.2-1 % OT SUSP: 3.0000 [drp] | Freq: Two times a day (BID) | OTIC | Status: DC

## 2015-09-29 MED ORDER — CARBAMIDE PEROXIDE 6.5 % OT SOLN: 5.0000 [drp] | Freq: Two times a day (BID) | OTIC | Status: DC

## 2015-09-29 MED ORDER — HYDROCHLOROTHIAZIDE 12.5 MG PO CAPS: 12.5000 mg | ORAL_CAPSULE | Freq: Every day | ORAL | Status: DC

## 2015-09-29 MED ORDER — INSULIN GLARGINE 100 UNIT/ML SOLOSTAR PEN: 36.0000 [IU] | PEN_INJECTOR | Freq: Every day | SUBCUTANEOUS | Status: DC

## 2015-09-29 MED ORDER — CIPROFLOXACIN-DEXAMETHASONE 0.3-0.1 % OT SUSP: 4.0000 [drp] | Freq: Two times a day (BID) | OTIC | Status: DC

## 2015-09-29 NOTE — Patient Instructions (Signed)
GOAL AM GLUCOSE 80-140'S. IF >180, NEED TO INCREASE LANTUS BY 2 UNITS EVERY WEEK UTIL REACH GOAL.  DASH Eating Plan DASH stands for "Dietary Approaches to Stop Hypertension." The DASH eating plan is a healthy eating plan that has been shown to reduce high blood pressure (hypertension). Additional health benefits may include reducing the risk of type 2 diabetes mellitus, heart disease, and stroke. The DASH eating plan may also help with weight loss. WHAT DO I NEED TO KNOW ABOUT THE DASH EATING PLAN? For the DASH eating plan, you will follow these general guidelines:  Choose foods with a percent daily value for sodium of less than 5% (as listed on the food label).  Use salt-free seasonings or herbs instead of table salt or sea salt.  Check with your health care provider or pharmacist before using salt substitutes.  Eat lower-sodium products, often labeled as "lower sodium" or "no salt added."  Eat fresh foods.  Eat more vegetables, fruits, and low-fat dairy products.  Choose whole grains. Look for the word "whole" as the first word in the ingredient list.  Choose fish and skinless chicken or [place] more often than red meat. Limit fish, poultry, and meat to 6 oz (170 g) each day.  Limit sweets, desserts, sugars, and sugary drinks.  Choose heart-healthy fats.  Limit cheese to 1 oz (28 g) per day.  Eat more home-cooked food and less restaurant, buffet, and fast food.  Limit fried foods.  Cook foods using methods other than frying.  Limit canned vegetables. If you do use them, rinse them well to decrease the sodium.  When eating at a restaurant, ask that your food be prepared with less salt, or no salt if possible. WHAT FOODS CAN I EAT? Seek help from a dietitian for individual calorie needs. Grains Whole grain or whole wheat bread. Brown rice. Whole grain or whole wheat pasta. Quinoa, bulgur, and whole grain cereals. Low-sodium cereals. Corn or whole wheat flour tortillas. Whole  grain cornbread. Whole grain crackers. Low-sodium crackers. Vegetables Fresh or frozen vegetables (raw, steamed, roasted, or grilled). Low-sodium or reduced-sodium tomato and vegetable juices. Low-sodium or reduced-sodium tomato sauce and paste. Low-sodium or reduced-sodium canned vegetables.  Fruits All fresh, canned (in natural juice), or frozen fruits. Meat and Other Protein Products Ground beef (85% or leaner), grass-fed beef, or beef trimmed of fat. Skinless chicken or [place]. Ground chicken or [place]. Pork trimmed of fat. All fish and seafood. Eggs. Dried beans, peas, or lentils. Unsalted nuts and seeds. Unsalted canned beans. Dairy Low-fat dairy products, such as skim or 1% milk, 2% or reduced-fat cheeses, low-fat ricotta or cottage cheese, or plain low-fat yogurt. Low-sodium or reduced-sodium cheeses. Fats and Oils Tub margarines without trans fats. Light or reduced-fat mayonnaise and salad dressings (reduced sodium). Avocado. Safflower, olive, or canola oils. Natural peanut or almond butter. Other Unsalted popcorn and pretzels. The items listed above may not be a complete list of recommended foods or beverages. Contact your dietitian for more options. WHAT FOODS ARE NOT RECOMMENDED? Grains White bread. White pasta. White rice. Refined cornbread. Bagels and croissants. Crackers that contain trans fat. Vegetables Creamed or fried vegetables. Vegetables in a cheese sauce. Regular canned vegetables. Regular canned tomato sauce and paste. Regular tomato and vegetable juices. Fruits Dried fruits. Canned fruit in light or heavy syrup. Fruit juice. Meat and Other Protein Products Fatty cuts of meat. Ribs, chicken wings, bacon, sausage, bologna, salami, chitterlings, fatback, hot dogs, bratwurst, and packaged luncheon meats. Salted nuts and seeds.  Canned beans with salt. Dairy Whole or 2% milk, cream, half-and-half, and cream cheese. Whole-fat or sweetened yogurt. Full-fat cheeses or blue  cheese. Nondairy creamers and whipped toppings. Processed cheese, cheese spreads, or cheese curds. Condiments Onion and garlic salt, seasoned salt, table salt, and sea salt. Canned and packaged gravies. Worcestershire sauce. Tartar sauce. Barbecue sauce. Teriyaki sauce. Soy sauce, including reduced sodium. Steak sauce. Fish sauce. Oyster sauce. Cocktail sauce. Horseradish. Ketchup and mustard. Meat flavorings and tenderizers. Bouillon cubes. Hot sauce. Tabasco sauce. Marinades. Taco seasonings. Relishes. Fats and Oils Butter, stick margarine, lard, shortening, ghee, and bacon fat. Coconut, palm kernel, or palm oils. Regular salad dressings. Other Pickles and olives. Salted popcorn and pretzels. The items listed above may not be a complete list of foods and beverages to avoid. Contact your dietitian for more information. WHERE CAN I FIND MORE INFORMATION? National Heart, Lung, and Blood Institute: CableProm[employer].itwww.nhlbi.nih.gov/health/health-topics/topics/dash/   This information is not intended to replace advice given to you by your health care provider. Make sure you discuss any questions you have with your health care provider.   Document Released: 04/06/2011 Document Revised: 05/08/2014 Document Reviewed: 02/19/2013 Elsevier Interactive Patient Education 2016 [employer] Inc.  - Diabetes Mellitus and Food It is important for you to manage your blood sugar (glucose) level. Your blood glucose level can be greatly affected by what you eat. Eating healthier foods in the appropriate amounts throughout the day at about the same time each day will help you control your blood glucose level. It can also help slow or prevent worsening of your diabetes mellitus. Healthy eating may even help you improve the level of your blood pressure and reach or maintain a healthy weight.  General recommendations for healthful eating and cooking habits include:  Eating meals and snacks regularly. Avoid going long periods of time  without eating to lose weight.  Eating a diet that consists mainly of plant-based foods, such as fruits, vegetables, nuts, legumes, and whole grains.  Using low-heat cooking methods, such as baking, instead of high-heat cooking methods, such as deep frying. Work with your dietitian to make sure you understand how to use the Nutrition Facts information on food labels. HOW CAN FOOD AFFECT ME? Carbohydrates Carbohydrates affect your blood glucose level more than any other type of food. Your dietitian will help you determine how many carbohydrates to eat at each meal and teach you how to count carbohydrates. Counting carbohydrates is important to keep your blood glucose at a healthy level, especially if you are using insulin or taking certain medicines for diabetes mellitus. Alcohol Alcohol can cause sudden decreases in blood glucose (hypoglycemia), especially if you use insulin or take certain medicines for diabetes mellitus. Hypoglycemia can be a life-threatening condition. Symptoms of hypoglycemia (sleepiness, dizziness, and disorientation) are similar to symptoms of having too much alcohol.  If your health care provider has given you approval to drink alcohol, do so in moderation and use the following guidelines:  Women should not have more than one drink per day, and men should not have more than two drinks per day. One drink is equal to:  12 oz of beer.  5 oz of wine.  1 oz of hard liquor.  Do not drink on an empty stomach.  Keep yourself hydrated. Have water, diet soda, or unsweetened iced tea.  Regular soda, juice, and other mixers might contain a lot of carbohydrates and should be counted. WHAT FOODS ARE NOT RECOMMENDED? As you make food choices, it is important to  remember that all foods are not the same. Some foods have fewer nutrients per serving than other foods, even though they might have the same number of calories or carbohydrates. It is difficult to get your body what it  needs when you eat foods with fewer nutrients. Examples of foods that you should avoid that are high in calories and carbohydrates but low in nutrients include:  Trans fats (most processed foods list trans fats on the Nutrition Facts label).  Regular soda.  Juice.  Candy.  Sweets, such as cake, pie, doughnuts, and cookies.  Fried foods. WHAT FOODS CAN I EAT? Eat nutrient-rich foods, which will nourish your body and keep you healthy. The food you should eat also will depend on several factors, including:  The calories you need.  The medicines you take.  Your weight.  Your blood glucose level.  Your blood pressure level.  Your cholesterol level. You should eat a variety of foods, including:  Protein.  Lean cuts of meat.  Proteins low in saturated fats, such as fish, egg whites, and beans. Avoid processed meats.  Fruits and vegetables.  Fruits and vegetables that may help control blood glucose levels, such as apples, mangoes, and yams.  Dairy products.  Choose fat-free or low-fat dairy products, such as milk, yogurt, and cheese.  Grains, bread, pasta, and rice.  Choose whole grain products, such as multigrain bread, whole oats, and brown rice. These foods may help control blood pressure.  Fats.  Foods containing healthful fats, such as nuts, avocado, olive oil, canola oil, and fish. DOES EVERYONE WITH DIABETES MELLITUS HAVE THE SAME MEAL PLAN? Because every person with diabetes mellitus is different, there is not one meal plan that works for everyone. It is very important that you meet with a dietitian who will help you create a meal plan that is just right for you.   This information is not intended to replace advice given to you by your health care provider. Make sure you discuss any questions you have with your health care provider.   Document Released: 01/12/2005 Document Revised: 05/08/2014 Document Reviewed: 03/14/2013 Elsevier Interactive Patient Education  [employer]
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
## 2015-09-30 ENCOUNTER — Other Ambulatory Visit: Payer: Self-pay | Admitting: *Deleted

## 2015-09-30 MED ORDER — CIPROFLOXACIN-DEXAMETHASONE 0.3-0.1 % OT SUSP: 4.0000 [drp] | Freq: Two times a day (BID) | OTIC | Status: DC

## 2015-09-30 NOTE — Telephone Encounter (Signed)
Patients medication was reordered due to PCP not being medicaid eligible just yet.

## 2015-10-17 ENCOUNTER — Other Ambulatory Visit: Payer: Self-pay | Admitting: Internal Medicine

## 2015-10-27 ENCOUNTER — Ambulatory Visit: Payer: Medicaid Other | Admitting: Internal Medicine

## 2015-11-03 ENCOUNTER — Telehealth: Payer: Self-pay | Admitting: *Deleted

## 2015-11-03 NOTE — Telephone Encounter (Signed)
Medical Assistant left message on patient's home and cell voicemail. Voicemail states [place] [facility] [place] [facility] [place] [facility] [place] [facility] [phone].   !!!Please ask patient to provide the names all ALL medications which need refills, patient will need to have bottles in hand and specify the needs in order to receive a refill accordingly!!!

## 2016-01-13 ENCOUNTER — Other Ambulatory Visit: Payer: Self-pay | Admitting: Internal Medicine

## 2016-01-14 ENCOUNTER — Other Ambulatory Visit: Payer: Self-pay | Admitting: Internal Medicine

## 2016-01-17 ENCOUNTER — Encounter: Payer: Self-pay | Admitting: Internal Medicine

## 2016-01-17 ENCOUNTER — Ambulatory Visit: Payer: Medicaid Other | Attending: Internal Medicine | Admitting: Internal Medicine

## 2016-01-17 VITALS — BP 170/80 | HR 78 | Temp 97.9°F | Resp 16 | Wt 200.2 lb

## 2016-01-17 DIAGNOSIS — B350 Tinea barbae and tinea capitis: Secondary | ICD-10-CM | POA: Diagnosis not present

## 2016-01-17 DIAGNOSIS — E119 Type 2 diabetes mellitus without complications: Secondary | ICD-10-CM | POA: Diagnosis present

## 2016-01-17 DIAGNOSIS — E785 Hyperlipidemia, unspecified: Secondary | ICD-10-CM | POA: Insufficient documentation

## 2016-01-17 DIAGNOSIS — F17210 Nicotine dependence, cigarettes, uncomplicated: Secondary | ICD-10-CM | POA: Insufficient documentation

## 2016-01-17 DIAGNOSIS — J069 Acute upper respiratory infection, unspecified: Secondary | ICD-10-CM | POA: Diagnosis not present

## 2016-01-17 DIAGNOSIS — K219 Gastro-esophageal reflux disease without esophagitis: Secondary | ICD-10-CM | POA: Insufficient documentation

## 2016-01-17 DIAGNOSIS — Z794 Long term (current) use of insulin: Secondary | ICD-10-CM | POA: Diagnosis present

## 2016-01-17 DIAGNOSIS — I10 Essential (primary) hypertension: Secondary | ICD-10-CM

## 2016-01-17 LAB — GLUCOSE, POCT (MANUAL RESULT ENTRY): POC GLUCOSE: 135 mg/dL — AB (ref 70–99)

## 2016-01-17 LAB — POCT GLYCOSYLATED HEMOGLOBIN (HGB A1C): HEMOGLOBIN A1C: 7.4

## 2016-01-17 MED ORDER — LISINOPRIL-HYDROCHLOROTHIAZIDE 20-25 MG PO TABS: 1.0000 | ORAL_TABLET | Freq: Every day | ORAL | 3 refills | Status: DC

## 2016-01-17 MED ORDER — FREESTYLE LANCETS MISC: 12 refills | Status: DC

## 2016-01-17 MED ORDER — INSULIN PEN NEEDLE 31G X 8 MM MISC: 5 refills | Status: DC

## 2016-01-17 MED ORDER — GLUCOSE BLOOD VI STRP: ORAL_STRIP | 12 refills | Status: DC

## 2016-01-17 MED ORDER — CIPROFLOXACIN-DEXAMETHASONE 0.3-0.1 % OT SUSP: 4.0000 [drp] | Freq: Two times a day (BID) | OTIC | 0 refills | Status: DC

## 2016-01-17 MED ORDER — KETOCONAZOLE 2 % EX CREA: 1.0000 "application " | TOPICAL_CREAM | Freq: Every day | CUTANEOUS | 3 refills | Status: DC

## 2016-01-17 MED ORDER — INSULIN GLARGINE 100 UNIT/ML SOLOSTAR PEN: 36.0000 [IU] | PEN_INJECTOR | Freq: Every day | SUBCUTANEOUS | 11 refills | Status: DC

## 2016-01-17 MED ORDER — GUAIFENESIN ER 600 MG PO TB12: 600.0000 mg | ORAL_TABLET | Freq: Two times a day (BID) | ORAL | 0 refills | Status: DC | PRN

## 2016-01-17 NOTE — Patient Instructions (Signed)
Diabetes Mellitus and Food It is important for you to manage your blood sugar (glucose) level. Your blood glucose level can be greatly affected by what you eat. Eating healthier foods in the appropriate amounts throughout the day at about the same time each day will help you control your blood glucose level. It can also help slow or prevent worsening of your diabetes mellitus. Healthy eating may even help you improve the level of your blood pressure and reach or maintain a healthy weight.  General recommendations for healthful eating and cooking habits include:  Eating meals and snacks regularly. Avoid going long periods of time without eating to lose weight.  Eating a diet that consists mainly of plant-based foods, such as fruits, vegetables, nuts, legumes, and whole grains.  Using low-heat cooking methods, such as baking, instead of high-heat cooking methods, such as deep frying. Work with your dietitian to make sure you understand how to use the Nutrition Facts information on food labels. HOW CAN FOOD AFFECT ME? Carbohydrates Carbohydrates affect your blood glucose level more than any other type of food. Your dietitian will help you determine how many carbohydrates to eat at each meal and teach you how to count carbohydrates. Counting carbohydrates is important to keep your blood glucose at a healthy level, especially if you are using insulin or taking certain medicines for diabetes mellitus. Alcohol Alcohol can cause sudden decreases in blood glucose (hypoglycemia), especially if you use insulin or take certain medicines for diabetes mellitus. Hypoglycemia can be a life-threatening condition. Symptoms of hypoglycemia (sleepiness, dizziness, and disorientation) are similar to symptoms of having too much alcohol.  If your health care provider has given you approval to drink alcohol, do so in moderation and use the following guidelines:  Women should not have more than one drink per day, and men  should not have more than two drinks per day. One drink is equal to:  12 oz of beer.  5 oz of wine.  1 oz of hard liquor.  Do not drink on an empty stomach.  Keep yourself hydrated. Have water, diet soda, or unsweetened iced tea.  Regular soda, juice, and other mixers might contain a lot of carbohydrates and should be counted. WHAT FOODS ARE NOT RECOMMENDED? As you make food choices, it is important to remember that all foods are not the same. Some foods have fewer nutrients per serving than other foods, even though they might have the same number of calories or carbohydrates. It is difficult to get your body what it needs when you eat foods with fewer nutrients. Examples of foods that you should avoid that are high in calories and carbohydrates but low in nutrients include:  Trans fats (most processed foods list trans fats on the Nutrition Facts label).  Regular soda.  Juice.  Candy.  Sweets, such as cake, pie, doughnuts, and cookies.  Fried foods. WHAT FOODS CAN I EAT? Eat nutrient-rich foods, which will nourish your body and keep you healthy. The food you should eat also will depend on several factors, including:  The calories you need.  The medicines you take.  Your weight.  Your blood glucose level.  Your blood pressure level.  Your cholesterol level. You should eat a variety of foods, including:  Protein.  Lean cuts of meat.  Proteins low in saturated fats, such as fish, egg whites, and beans. Avoid processed meats.  Fruits and vegetables.  Fruits and vegetables that may help control blood glucose levels, such as apples, mangoes, and   yams.  Dairy products.  Choose fat-free or low-fat dairy products, such as milk, yogurt, and cheese.  Grains, bread, pasta, and rice.  Choose whole grain products, such as multigrain bread, whole oats, and brown rice. These foods may help control blood pressure.  Fats.  Foods containing healthful fats, such as nuts,  avocado, olive oil, canola oil, and fish. DOES EVERYONE WITH DIABETES MELLITUS HAVE THE SAME MEAL PLAN? Because every person with diabetes mellitus is different, there is not one meal plan that works for everyone. It is very important that you meet with a dietitian who will help you create a meal plan that is just right for you.   This information is not intended to replace advice given to you by your health care provider. Make sure you discuss any questions you have with your health care provider.   Document Released: 01/12/2005 Document Revised: 05/08/2014 Document Reviewed: 03/14/2013 Elsevier Interactive Patient Education 2016 [place] for Eating Away From Home If You Have Diabetes Controlling your level of blood glucose, also known as blood sugar, can be challenging. It can be even more difficult when you do not prepare your own meals. The following tips can help you manage your diabetes when you eat away from home. PLANNING AHEAD Plan ahead if you know you will be eating away from home:  Ask your health care provider how to time meals and medicine if you are taking insulin.  Make a list of restaurants near you that offer healthy choices. If they have a carry-out menu, take it home and plan what you will order ahead of time.  Look up the restaurant you want to eat at online. Many chain and fast-food restaurants list nutritional information online. Use this information to choose the healthiest options and to calculate how many carbohydrates will be in your meal.  Use a carbohydrate-counting book or mobile app to look up the carbohydrate content and serving size of the foods you want to eat.  Become familiar with serving sizes and learn to recognize how many servings are in a portion. This will allow you to estimate how many carbohydrates you can eat. FREE FOODS A "free food" is any food or drink that has less than 5 g of carbohydrates per serving. Free foods  include:  Many vegetables.  Hard boiled eggs.  Nuts or seeds.  Olives.  Cheeses.  Meats. These types of foods make good appetizer choices and are often available at salad bars. Lemon juice, vinegar, or a low-calorie salad dressing of fewer than 20 calories per serving can be used as a "free" salad dressing.  CHOICES TO REDUCE CARBOHYDRATES  Substitute nonfat sweetened yogurt with a sugar-free yogurt. Yogurt made from soy milk may also be used, but you will still want a sugar-free or plain option to choose a lower carbohydrate amount.  Ask your server to take away the bread basket or chips from your table.  Order fresh fruit. A salad bar often offers fresh fruit choices. Avoid canned fruit because it is usually packed in sugar or syrup.  Order a salad, and eat it without dressing. Or, create a "free" salad dressing.  Ask for substitutions. For example, instead of [place] fries, request an order of a vegetable such as salad, green beans, or broccoli. OTHER TIPS   If you take insulin, take the insulin once your food arrives to your table. This will ensure your insulin and food are timed correctly.  Ask your server about the  portion size before your order, and ask for a take-out box if the portion has more servings than you should have. When your food comes, leave the amount you should have on the plate, and put the rest in the take-out box.  Consider splitting an entree with someone and ordering a side salad.   This information is not intended to replace advice given to you by your health care provider. Make sure you discuss any questions you have with your health care provider.   Document Released: 04/17/2005 Document Revised: 01/06/2015 Document Reviewed: 07/15/2013 Elsevier Interactive Patient Education 2016 Elsevier Inc.   -  Hypertension Hypertension is another name for high blood pressure. High blood pressure forces your heart to work harder to pump blood. A blood  pressure reading has two numbers, which includes a higher number over a lower number (example: 110/72). HOME CARE   Have your blood pressure rechecked by your doctor.  Only take medicine as told by your doctor. Follow the directions carefully. The medicine does not work as well if you skip doses. Skipping doses also puts you at risk for problems.  Do not smoke.  Monitor your blood pressure at home as told by your doctor. GET HELP IF:  You think you are having a reaction to the medicine you are taking.  You have repeat headaches or feel dizzy.  You have puffiness (swelling) in your ankles.  You have trouble with your vision. GET HELP RIGHT AWAY IF:   You get a very bad headache and are confused.  You feel weak, numb, or faint.  You get chest or belly (abdominal) pain.  You throw up (vomit).  You cannot breathe very well. MAKE SURE YOU:   Understand these instructions.  Will watch your condition.  Will get help right away if you are not doing well or get worse.   This information is not intended to replace advice given to you by your health care provider. Make sure you discuss any questions you have with your health care provider.   Document Released: 10/04/2007 Document Revised: 04/22/2013 Document Reviewed: 02/07/2013 Elsevier Interactive Patient Education 2016 Elsevier Inc.  - DASH Eating Plan DASH stands for "Dietary Approaches to Stop Hypertension." The DASH eating plan is a healthy eating plan that has been shown to reduce high blood pressure (hypertension). Additional health benefits may include reducing the risk of type 2 diabetes mellitus, heart disease, and stroke. The DASH eating plan may also help with weight loss. WHAT DO I NEED TO KNOW ABOUT THE DASH EATING PLAN? For the DASH eating plan, you will follow these general guidelines:  Choose foods with a percent daily value for sodium of less than 5% (as listed on the food label).  Use salt-free seasonings  or herbs instead of table salt or sea salt.  Check with your health care provider or pharmacist before using salt substitutes.  Eat lower-sodium products, often labeled as "lower sodium" or "no salt added."  Eat fresh foods.  Eat more vegetables, fruits, and low-fat dairy products.  Choose whole grains. Look for the word "whole" as the first word in the ingredient list.  Choose fish and skinless chicken or [place] more often than red meat. Limit fish, poultry, and meat to 6 oz (170 g) each day.  Limit sweets, desserts, sugars, and sugary drinks.  Choose heart-healthy fats.  Limit cheese to 1 oz (28 g) per day.  Eat more home-cooked food and less restaurant, buffet, and fast food.  Limit fried  foods.  Adriana Simas foods using methods other than frying.  Limit canned vegetables. If you do use them, rinse them well to decrease the sodium.  When eating at a restaurant, ask that your food be prepared with less salt, or no salt if possible. WHAT FOODS CAN I EAT? Seek help from a dietitian for individual calorie needs. Grains Whole grain or whole wheat bread. Brown rice. Whole grain or whole wheat pasta. Quinoa, bulgur, and whole grain cereals. Low-sodium cereals. Corn or whole wheat flour tortillas. Whole grain cornbread. Whole grain crackers. Low-sodium crackers. Vegetables Fresh or frozen vegetables (raw, steamed, roasted, or grilled). Low-sodium or reduced-sodium tomato and vegetable juices. Low-sodium or reduced-sodium tomato sauce and paste. Low-sodium or reduced-sodium canned vegetables.  Fruits All fresh, canned (in natural juice), or frozen fruits. Meat and Other Protein Products Ground beef (85% or leaner), grass-fed beef, or beef trimmed of fat. Skinless chicken or [place]. Ground chicken or [place]. Pork trimmed of fat. All fish and seafood. Eggs. Dried beans, peas, or lentils. Unsalted nuts and seeds. Unsalted canned beans. Dairy Low-fat dairy products, such as skim or 1% milk, 2%  or reduced-fat cheeses, low-fat ricotta or cottage cheese, or plain low-fat yogurt. Low-sodium or reduced-sodium cheeses. Fats and Oils Tub margarines without trans fats. Light or reduced-fat mayonnaise and salad dressings (reduced sodium). Avocado. Safflower, olive, or canola oils. Natural peanut or almond butter. Other Unsalted popcorn and pretzels. The items listed above may not be a complete list of recommended foods or beverages. Contact your dietitian for more options. WHAT FOODS ARE NOT RECOMMENDED? Grains White bread. White pasta. White rice. Refined cornbread. Bagels and croissants. Crackers that contain trans fat. Vegetables Creamed or fried vegetables. Vegetables in a cheese sauce. Regular canned vegetables. Regular canned tomato sauce and paste. Regular tomato and vegetable juices. Fruits Dried fruits. Canned fruit in light or heavy syrup. Fruit juice. Meat and Other Protein Products Fatty cuts of meat. Ribs, chicken wings, bacon, sausage, bologna, salami, chitterlings, fatback, hot dogs, bratwurst, and packaged luncheon meats. Salted nuts and seeds. Canned beans with salt. Dairy Whole or 2% milk, cream, half-and-half, and cream cheese. Whole-fat or sweetened yogurt. Full-fat cheeses or blue cheese. Nondairy creamers and whipped toppings. Processed cheese, cheese spreads, or cheese curds. Condiments Onion and garlic salt, seasoned salt, table salt, and sea salt. Canned and packaged gravies. Worcestershire sauce. Tartar sauce. Barbecue sauce. Teriyaki sauce. Soy sauce, including reduced sodium. Steak sauce. Fish sauce. Oyster sauce. Cocktail sauce. Horseradish. Ketchup and mustard. Meat flavorings and tenderizers. Bouillon cubes. Hot sauce. Tabasco sauce. Marinades. Taco seasonings. Relishes. Fats and Oils Butter, stick margarine, lard, shortening, ghee, and bacon fat. Coconut, palm kernel, or palm oils. Regular salad dressings. Other Pickles and olives. Salted popcorn and  pretzels. The items listed above may not be a complete list of foods and beverages to avoid. Contact your dietitian for more information. WHERE CAN I FIND MORE INFORMATION? National Heart, Lung, and Blood Institute: [employer]   This information is not intended to replace advice given to you by your health care provider. Make sure you discuss any questions you have with your health care provider.   Document Released: 04/06/2011 Document Revised: 05/08/2014 Document Reviewed: 02/19/2013 Elsevier Interactive Patient Education [employer]  -

## 2016-01-17 NOTE — Progress Notes (Signed)
Stephanie Neal, is a 60 y.o. female  JOI:[id number]  OBS:[id number]  DOB - 09/23/1955  Chief Complaint  Patient presents with  . Follow-up        Subjective:   Stephanie Neal is a 60 y.o. female here today for a follow up visit for htn and dm.  Stressed out w/ family members, c/o of congestion last few days. Asked if mucinex dm ok?  Her gerd is well controlled, not taking ppi. Does occasionally take tums.  For dm, trying to watch food more, exercising sometimes, still taking metformin 1000bid and lantus 36 at night.  She tries to watch her salt, but when eating out w/ family, there is some salt involved.  She is smoking less now, down to 1-4 cigs daily, trying to cut down.  C/o of more hair loss recently, brittle hair, wearing wig currently.  C/o of pruritic scalp, itching it a lot.  Asked if she needs MM this year. Has had negatives last 3 years, no family hx of breast cancer, no abnml lumps/bumps on self exam  Patient has No headache,no f/c,  No chest pain, No abdominal pain - No Nausea, No new weakness tingling or numbness, No Cough - SOB.  No problems updated.  ALLERGIES: Allergies  Allergen Reactions  . Penicillins Other (See Comments)    "blanked out" Has patient had a PCN reaction causing immediate rash, facial/tongue/throat swelling, SOB or lightheadedness with hypotension: No Has patient had a PCN reaction causing severe rash involving mucus membranes or skin necrosis: No Has patient had a PCN reaction that required hospitalization No Has patient had a PCN reaction occurring within the last 10 years: No If all of the above answers are "NO", then may proceed with Cephalosporin use.     PAST MEDICAL HISTORY: Past Medical History:  Diagnosis Date  . Diabetes mellitus   . Hyperlipidemia   . Hypertension     MEDICATIONS AT HOME: Prior to Admission medications   Medication Sig Start Date End Date Taking? Authorizing Provider  atorvastatin (LIPITOR) 40 MG  tablet TAKE 1 TABLET EVERY DAY 01/13/16  Yes Maren Reamer, MD  carbamide peroxide (DEBROX) 6.5 % otic solution Place 5 drops into the right ear 2 (two) times daily. 09/29/15  Yes Maren Reamer, MD  fluticasone (FLONASE) 50 MCG/ACT nasal spray Place 2 sprays into both nostrils daily. 04/19/15  Yes Lance Bosch, NP  glucose blood test strip Use as instructed 01/17/16  Yes Maren Reamer, MD  glucose monitoring kit (FREESTYLE) monitoring kit 1 each by Does not apply route as needed for other. 12/03/13  Yes Lance Bosch, NP  glucose monitoring kit (FREESTYLE) monitoring kit 1 each by Does not apply route as needed for other. Check blood sugar TID & QHS 12/04/13  Yes Lance Bosch, NP  ibuprofen (ADVIL,MOTRIN) 600 MG tablet Take 1 tablet (600 mg total) by mouth every 6 (six) hours as needed. 08/04/15  Yes [name], PA-C  Insulin Glargine (LANTUS SOLOSTAR) 100 UNIT/ML Solostar Pen Inject 36 Units into the skin daily at 10 pm. 01/17/16  Yes Maren Reamer, MD  Insulin Pen Needle 31G X 8 MM MISC Use lantus nightly for E11.9 01/17/16  Yes Maren Reamer, MD  Lancets (FREESTYLE) lancets Use as instructed 01/17/16  Yes Maren Reamer, MD  metFORMIN (GLUCOPHAGE) 1000 MG tablet Take 1 tablet (1,000 mg total) by mouth 2 (two) times daily with a meal. For diabetes 04/19/15  Yes Lance Bosch,  NP  bacitracin-polymyxin b (POLYSPORIN) ointment Apply topically 2 (two) times daily. Patient not taking: Reported on 01/17/2016 09/23/15   Janne Napoleon, NP  ciprofloxacin-dexamethasone (CIPRODEX) otic suspension Place 4 drops into the left ear 2 (two) times daily. 01/17/16   Maren Reamer, MD  cyclobenzaprine (FLEXERIL) 5 MG tablet Take 1 tablet (5 mg total) by mouth 3 (three) times daily as needed for muscle spasms. Patient not taking: Reported on 01/17/2016 09/23/15   Janne Napoleon, NP  guaiFENesin (MUCINEX) 600 MG 12 hr tablet Take 1 tablet (600 mg total) by mouth 2 (two) times daily as needed. 01/17/16   Maren Reamer, MD  ketoconazole (NIZORAL) 2 % cream Apply 1 application topically daily. head 01/17/16   Maren Reamer, MD  lisinopril-hydrochlorothiazide (PRINZIDE,ZESTORETIC) 20-25 MG tablet Take 1 tablet by mouth daily. 01/17/16   Maren Reamer, MD     Objective:   Vitals:   01/17/16 1019  BP: (!) 170/80  Pulse: 78  Resp: 16  Temp: 97.9 F (36.6 C)  TempSrc: Oral  SpO2: 96%  Weight: 200 lb 3.2 oz (90.8 kg)    Exam General appearance : Awake, alert, not in any distress. Speech Clear. Not toxic looking, pleasant. HEENT: Atraumatic and Normocephalic, pupils equally reactive to light. Neck: supple, no JVD.  Chest:Good air entry bilaterally, no added sounds. CVS: S1 S2 regular, no murmurs/gallups or rubs. Abdomen: Bowel sounds active, Non tender and not distended with no gaurding, rigidity or rebound. Foot exam: bilateral peripheral pulses 2+ (dorsalis pedis and post tibialis pulses), no ulcers noted/no ecchymosis, warm to touch, monofilament testing 3/3 bilat. Sensation intact.  No c/c/e. Neurology: Awake alert, and oriented X 3, CN II-XII grossly intact, Non focal Skin:No Rash, dry flaky scalp under hair wig, short gray curly hair, brittle to touch.  Data Review Lab Results  Component Value Date   HGBA1C 7.7 09/29/2015   HGBA1C 7.10 04/19/2015   HGBA1C 7.90 12/17/2014    Depression screen PHQ 2/9 01/17/2016 09/29/2015 12/17/2014 12/17/2014 12/03/2013  Decreased Interest 0 0 0 0 1  Down, Depressed, Hopeless 0 0 0 0 0  PHQ - 2 Score 0 0 0 0 1  Altered sleeping - 0 - - -  Tired, decreased energy - 0 - - -  Change in appetite - 0 - - -  Feeling bad or failure about yourself  - 0 - - -  Trouble concentrating - 0 - - -  Moving slowly or fidgety/restless - 0 - - -  Suicidal thoughts - 0 - - -  PHQ-9 Score - 0 - - -  Difficult doing work/chores - Not difficult at all - - -      Assessment & Plan   1. Type 2 diabetes mellitus without complication, unspecified long term  insulin use status (HCC) - renewed meds, same rx today - glucose blood test strip; Use as instructed  Dispense: 100 each; Refill: 12 - Lancets (FREESTYLE) lancets; Use as instructed  Dispense: 100 each; Refill: 12 - Ambulatory referral to Ophthalmology - Insulin Pen Needle 31G X 8 MM MISC; Use lantus nightly for E11.9  Dispense: 90 each; Refill: 5 - Insulin Glargine (LANTUS SOLOSTAR) 100 UNIT/ML Solostar Pen; Inject 36 Units into the skin daily at 10 pm.  Dispense: 5 pen; Refill: 11 - aic 7.4 today  2. Htn, uncontrolled - discussed w/ pt low salt/dash diet, increase exercise - chg rx to prinzide 20-25 qday (doubling both lisinopril and hctz for now) - close  f/u in few wks  3. Tinea capitis? As cause of hair loss Trial ketoconazole cream.  4. Mm screening - d/w pt current recds, since she has had 3 recent negatives screening, no worrisome home exams,  and no family hx, ok to get screening q2 years.  She is ok with this recds.  5. Needs papsmear.  6. [place], possible currently, w/ congestion,  No f/c/. Likely viral, prn mucinex Recd f/u w/ outpt flu shot clinic next week  7. Hold on pneumovax for now.  8. tob abuse Total cessation recd.     Patient have been counseled extensively about nutrition and exercise  Return in about 3 weeks (around 02/07/2016) for htn/ pap.  The patient was given clear instructions to go to ER or return to medical center if symptoms don't improve, worsen or new problems develop. The patient verbalized understanding. The patient was told to call to get lab results if they haven't heard anything in the next week.   This note has been created with Surveyor, quantity. Any transcriptional errors are unintentional.   Maren Reamer, MD, [place] [facility] [place], [place]   01/17/2016, 10:43 AM

## 2016-01-17 NOTE — Progress Notes (Signed)
Pt is in the office today for a follow up Pt denies pain today in the office

## 2016-03-06 ENCOUNTER — Ambulatory Visit: Payer: Medicaid Other | Attending: Internal Medicine | Admitting: Internal Medicine

## 2016-03-06 ENCOUNTER — Encounter: Payer: Self-pay | Admitting: Internal Medicine

## 2016-03-06 ENCOUNTER — Other Ambulatory Visit (HOSPITAL_COMMUNITY)
Admission: RE | Admit: 2016-03-06 | Discharge: 2016-03-06 | Disposition: A | Discharge status: Home / Self Care | Payer: Medicaid Other | Source: Ambulatory Visit | Attending: Internal Medicine | Admitting: Internal Medicine

## 2016-03-06 VITALS — BP 112/67 | HR 91 | Temp 98.1°F | Resp 16 | Wt 197.2 lb

## 2016-03-06 DIAGNOSIS — Z794 Long term (current) use of insulin: Secondary | ICD-10-CM

## 2016-03-06 DIAGNOSIS — Z124 Encounter for screening for malignant neoplasm of cervix: Secondary | ICD-10-CM

## 2016-03-06 DIAGNOSIS — F419 Anxiety disorder, unspecified: Secondary | ICD-10-CM | POA: Diagnosis not present

## 2016-03-06 DIAGNOSIS — Z1151 Encounter for screening for human papillomavirus (HPV): Secondary | ICD-10-CM | POA: Diagnosis present

## 2016-03-06 DIAGNOSIS — I10 Essential (primary) hypertension: Secondary | ICD-10-CM | POA: Diagnosis not present

## 2016-03-06 DIAGNOSIS — J40 Bronchitis, not specified as acute or chronic: Secondary | ICD-10-CM | POA: Diagnosis not present

## 2016-03-06 DIAGNOSIS — Z113 Encounter for screening for infections with a predominantly sexual mode of transmission: Secondary | ICD-10-CM | POA: Diagnosis present

## 2016-03-06 DIAGNOSIS — Z79899 Other long term (current) drug therapy: Secondary | ICD-10-CM | POA: Insufficient documentation

## 2016-03-06 DIAGNOSIS — E119 Type 2 diabetes mellitus without complications: Secondary | ICD-10-CM

## 2016-03-06 DIAGNOSIS — Z01419 Encounter for gynecological examination (general) (routine) without abnormal findings: Secondary | ICD-10-CM | POA: Diagnosis present

## 2016-03-06 DIAGNOSIS — Z880 Allergy status to penicillin: Secondary | ICD-10-CM | POA: Diagnosis not present

## 2016-03-06 DIAGNOSIS — N63 Unspecified lump in unspecified breast: Secondary | ICD-10-CM

## 2016-03-06 DIAGNOSIS — F411 Generalized anxiety disorder: Secondary | ICD-10-CM | POA: Diagnosis not present

## 2016-03-06 DIAGNOSIS — N6314 Unspecified lump in the right breast, lower inner quadrant: Secondary | ICD-10-CM | POA: Diagnosis not present

## 2016-03-06 DIAGNOSIS — E785 Hyperlipidemia, unspecified: Secondary | ICD-10-CM | POA: Diagnosis not present

## 2016-03-06 LAB — GLUCOSE, POCT (MANUAL RESULT ENTRY): POC GLUCOSE: 103 mg/dL — AB (ref 70–99)

## 2016-03-06 MED ORDER — INSULIN GLARGINE 100 UNIT/ML SOLOSTAR PEN: 30.0000 [IU] | PEN_INJECTOR | Freq: Every day | SUBCUTANEOUS | 11 refills | Status: DC

## 2016-03-06 MED ORDER — GLUCOSE BLOOD VI STRP: ORAL_STRIP | 12 refills | Status: DC

## 2016-03-06 MED ORDER — SENNOSIDES-DOCUSATE SODIUM 8.6-50 MG PO TABS: 1.0000 | ORAL_TABLET | Freq: Every evening | ORAL | 2 refills | Status: DC | PRN

## 2016-03-06 MED ORDER — EMBRACE LANCETS ULTRA THIN 30G MISC: 1.0000 | Freq: Two times a day (BID) | 11 refills | Status: DC

## 2016-03-06 NOTE — Patient Instructions (Signed)
* fu appt w/ [facility] 2 wks for dm chk   Check blood sugars on waking up 2  times a week, at least 2 x day for now until f/u w/ Misty StanleyStacey.  Also check blood sugars about 2 hours after a meal and do this after different meals by rotation  Recommended blood sugar levels on waking up is 90-130 and about 2 hours after meal is 130-160  Please bring your blood sugar monitor to each visit, thank you  Restart exercise  Stay on Lipitor//choleseterol med.    Diabetes Mellitus and Food It is important for you to manage your blood sugar (glucose) level. Your blood glucose level can be greatly affected by what you eat. Eating healthier foods in the appropriate amounts throughout the day at about the same time each day will help you control your blood glucose level. It can also help slow or prevent worsening of your diabetes mellitus. Healthy eating may even help you improve the level of your blood pressure and reach or maintain a healthy weight.  General recommendations for healthful eating and cooking habits include:  Eating meals and snacks regularly. Avoid going long periods of time without eating to lose weight.  Eating a diet that consists mainly of plant-based foods, such as fruits, vegetables, nuts, legumes, and whole grains.  Using low-heat cooking methods, such as baking, instead of high-heat cooking methods, such as deep frying. Work with your dietitian to make sure you understand how to use the Nutrition Facts information on food labels. HOW CAN FOOD AFFECT ME? Carbohydrates Carbohydrates affect your blood glucose level more than any other type of food. Your dietitian will help you determine how many carbohydrates to eat at each meal and teach you how to count carbohydrates. Counting carbohydrates is important to keep your blood glucose at a healthy level, especially if you are using insulin or taking certain medicines for diabetes mellitus. Alcohol Alcohol can cause sudden  decreases in blood glucose (hypoglycemia), especially if you use insulin or take certain medicines for diabetes mellitus. Hypoglycemia can be a life-threatening condition. Symptoms of hypoglycemia (sleepiness, dizziness, and disorientation) are similar to symptoms of having too much alcohol.  If your health care provider has given you approval to drink alcohol, do so in moderation and use the following guidelines:  Women should not have more than one drink per day, and men should not have more than two drinks per day. One drink is equal to:  12 oz of beer.  5 oz of wine.  1 oz of hard liquor.  Do not drink on an empty stomach.  Keep yourself hydrated. Have water, diet soda, or unsweetened iced tea.  Regular soda, juice, and other mixers might contain a lot of carbohydrates and should be counted. WHAT FOODS ARE NOT RECOMMENDED? As you make food choices, it is important to remember that all foods are not the same. Some foods have fewer nutrients per serving than other foods, even though they might have the same number of calories or carbohydrates. It is difficult to get your body what it needs when you eat foods with fewer nutrients. Examples of foods that you should avoid that are high in calories and carbohydrates but low in nutrients include:  Trans fats (most processed foods list trans fats on the Nutrition Facts label).  Regular soda.  Juice.  Candy.  Sweets, such as cake, pie, doughnuts, and cookies.  Fried foods. WHAT FOODS CAN I EAT? Eat nutrient-rich foods, which will nourish  your body and keep you healthy. The food you should eat also will depend on several factors, including:  The calories you need.  The medicines you take.  Your weight.  Your blood glucose level.  Your blood pressure level.  Your cholesterol level. You should eat a variety of foods, including:  Protein.  Lean cuts of meat.  Proteins low in saturated fats, such as fish, egg whites, and  beans. Avoid processed meats.  Fruits and vegetables.  Fruits and vegetables that may help control blood glucose levels, such as apples, mangoes, and yams.  Dairy products.  Choose fat-free or low-fat dairy products, such as milk, yogurt, and cheese.  Grains, bread, pasta, and rice.  Choose whole grain products, such as multigrain bread, whole oats, and brown rice. These foods may help control blood pressure.  Fats.  Foods containing healthful fats, such as nuts, avocado, olive oil, canola oil, and fish. DOES EVERYONE WITH DIABETES MELLITUS HAVE THE SAME MEAL PLAN? Because every person with diabetes mellitus is different, there is not one meal plan that works for everyone. It is very important that you meet with a dietitian who will help you create a meal plan that is just right for you.   This information is not intended to replace advice given to you by your health care provider. Make sure you discuss any questions you have with your health care provider.   Document Released: 01/12/2005 Document Revised: 05/08/2014 Document Reviewed: 03/14/2013 Elsevier Interactive Patient Education [employer]2016 Elsevier Inc.

## 2016-03-06 NOTE — Progress Notes (Signed)
Stephanie Neal, is a 60 y.o. female  FGH:[id number]  CVE:[id number]  DOB - 02/14/1956  Chief Complaint  Patient presents with  . Diabetes  . Hypertension  . Gynecologic Exam        Subjective:   Stephanie Neal is a 60 y.o. female here today for a follow up visit.  Last seen 9/18, here for papsmear.  Sates that  Has been getting over URI/cough last 2 wks, but overall better, less coughing (dry), no f/c/night sweats. No sob/doe.  She is smoking much less as well, sometimes will light a cig, but does not start b/c taste not palatable.  She is taking her meds as prescribed. Not checking her cbgs since her glucometer does not work well - she brought it in for [place] to [employer], has [employer].   Her itchy scalp is much improved since trx w/ ketaconazol cream, and now noticing hair growth again.  Anxious and tearful after exam with concerning findings.   Last pap 2006, rarely sexually active, last time few weeks ago, practices safe sex, not concernd for std. MM last year 7/16 normal.  Patient has No headache, No chest pain, No abdominal pain - No Nausea, No new weakness tingling or numbness, No Cough - SOB.  No problems updated.  ALLERGIES: Allergies  Allergen Reactions  . Penicillins Other (See Comments)    "blanked out" Has patient had a PCN reaction causing immediate rash, facial/tongue/throat swelling, SOB or lightheadedness with hypotension: No Has patient had a PCN reaction causing severe rash involving mucus membranes or skin necrosis: No Has patient had a PCN reaction that required hospitalization No Has patient had a PCN reaction occurring within the last 10 years: No If all of the above answers are "NO", then may proceed with Cephalosporin use.     PAST MEDICAL HISTORY: Past Medical History:  Diagnosis Date  . Diabetes mellitus   . Hyperlipidemia   . Hypertension     MEDICATIONS AT HOME: Prior to Admission medications   Medication Sig Start Date End Date  Taking? Authorizing Provider  fluticasone (FLONASE) 50 MCG/ACT nasal spray Place 2 sprays into both nostrils daily. 04/19/15  Yes Lance Bosch, NP  glucose monitoring kit (FREESTYLE) monitoring kit 1 each by Does not apply route as needed for other. 12/03/13  Yes Lance Bosch, NP  glucose monitoring kit (FREESTYLE) monitoring kit 1 each by Does not apply route as needed for other. Check blood sugar TID & QHS 12/04/13  Yes Lance Bosch, NP  Insulin Glargine (LANTUS SOLOSTAR) 100 UNIT/ML Solostar Pen Inject 30 Units into the skin daily at 10 pm. 03/06/16  Yes Maren Reamer, MD  Insulin Pen Needle 31G X 8 MM MISC Use lantus nightly for E11.9 01/17/16  Yes Maren Reamer, MD  lisinopril-hydrochlorothiazide (PRINZIDE,ZESTORETIC) 20-25 MG tablet Take 1 tablet by mouth daily. 01/17/16  Yes Maren Reamer, MD  metFORMIN (GLUCOPHAGE) 1000 MG tablet Take 1 tablet (1,000 mg total) by mouth 2 (two) times daily with a meal. For diabetes 04/19/15  Yes Lance Bosch, NP  atorvastatin (LIPITOR) 40 MG tablet TAKE 1 TABLET EVERY DAY 01/13/16   Maren Reamer, MD  bacitracin-polymyxin b (POLYSPORIN) ointment Apply topically 2 (two) times daily. Patient not taking: Reported on 01/17/2016 09/23/15   Janne Napoleon, NP  carbamide peroxide (DEBROX) 6.5 % otic solution Place 5 drops into the right ear 2 (two) times daily. Patient not taking: Reported on 03/06/2016 09/29/15   Shawnta Schlegel T  Lorien Shingler, MD  ciprofloxacin-dexamethasone (CIPRODEX) otic suspension Place 4 drops into the left ear 2 (two) times daily. Patient not taking: Reported on 03/06/2016 01/17/16   Maren Reamer, MD  cyclobenzaprine (FLEXERIL) 5 MG tablet Take 1 tablet (5 mg total) by mouth 3 (three) times daily as needed for muscle spasms. Patient not taking: Reported on 01/17/2016 09/23/15   Janne Napoleon, NP  EMBRACE LANCETS ULTRA THIN 23F MISC 1 applicator by Does not apply route 2 (two) times daily. 03/06/16   Maren Reamer, MD  glucose blood test strip Use as  instructed 03/06/16   Maren Reamer, MD  guaiFENesin (MUCINEX) 600 MG 12 hr tablet Take 1 tablet (600 mg total) by mouth 2 (two) times daily as needed. Patient not taking: Reported on 03/06/2016 01/17/16   Maren Reamer, MD  ibuprofen (ADVIL,MOTRIN) 600 MG tablet Take 1 tablet (600 mg total) by mouth every 6 (six) hours as needed. 08/04/15   [name], PA-C  ketoconazole (NIZORAL) 2 % cream Apply 1 application topically daily. head Patient not taking: Reported on 03/06/2016 01/17/16   Maren Reamer, MD  senna-docusate (SENOKOT-S) 8.6-50 MG tablet Take 1 tablet by mouth at bedtime as needed for mild constipation. 03/06/16   Maren Reamer, MD     Objective:   Vitals:   03/06/16 1027  BP: 112/67  Pulse: 91  Resp: 16  Temp: 98.1 F (36.7 C)  TempSrc: Oral  SpO2: 95%  Weight: 197 lb 3.2 oz (89.4 kg)   cma assisted well woman exam.  Exam General appearance : Awake, alert, not in any distress. Speech Clear. Not toxic looking, pleasant, anxious/tearful after exam HEENT: Atraumatic and Normocephalic, pupils equally reactive to light. bilat TMs clear. Neck: supple, no JVD. No cervical lymphadenopathy.  Chest:Good air entry bilaterally, no added sounds. Breast /axilla: bilat nml appearance, not dippling noted. Felt dense tissue on right breast 4O'clock, mass vs fibrous tissue. No nodules/nipple discharge noted on bilat exam  CVS: S1 S2 regular, no murmurs/gallups or rubs. Abdomen: Bowel sounds active, obese, Non tender and not distended with no gaurding, rigidity or rebound. Pelvic Exam: Cervix normal in appearance, external genitalia normal, no adnexal masses or tenderness, no cervical motion tenderness, rectovaginal septum normal, uterus normal size, shape, and consistency and vagina normal with thin discharge   Extremities: B/L Lower Ext shows no edema, both legs are warm to touch Neurology: Awake alert, and oriented X 3, CN II-XII grossly intact, Non focal Skin:No Rash  Data  Review Lab Results  Component Value Date   HGBA1C 7.4 01/17/2016   HGBA1C 7.7 09/29/2015   HGBA1C 7.10 04/19/2015    Depression screen PHQ 2/9 03/06/2016 01/17/2016 09/29/2015 12/17/2014 12/17/2014  Decreased Interest 0 0 0 0 0  Down, Depressed, Hopeless 0 0 0 0 0  PHQ - 2 Score 0 0 0 0 0  Altered sleeping - - 0 - -  Tired, decreased energy - - 0 - -  Change in appetite - - 0 - -  Feeling bad or failure about yourself  - - 0 - -  Trouble concentrating - - 0 - -  Moving slowly or fidgety/restless - - 0 - -  Suicidal thoughts - - 0 - -  PHQ-9 Score - - 0 - -  Difficult doing work/chores - - Not difficult at all - -      Assessment & Plan   1. Type 2 diabetes mellitus without complication, with long-term current use of insulin (Wink) -  will back down on lantus to 30 units for now. - POCT glucose (manual entry) 106 today - Insulin Glargine (LANTUS SOLOSTAR) 100 UNIT/ML Solostar Pen; Inject 30 Units into the skin daily at 10 pm.  Dispense: 5 pen; Refill: 11 - ask PHarmacist/Stacey to recalibrate the glucometer if able, teaching as well on use. Supplies ordered for Embrace brand - fu w/ Stacey 2 wks pharm clinic.  2. Breast lump in lower inner quadrant, right - may just be fibrous tissue. - MM DIAG BREAST TOMO BILATERAL; Future - US BREAST LTD UNI LEFT INC AXILLA; Future - US BREAST LTD UNI RIGHT INC AXILLA; Future  3. Pap smear for cervical cancer screening - Cytology - PAP  4. Anxiety - will ask SW to f/u w/ pt after visit as well.  5. Bronchitis - resolving, no rx today. - hold off on flu vac and pneumococcal vac today.    Patient have been counseled extensively about nutrition and exercise  Return in about 3 months (around 06/06/2016), or if cough symptoms worsen or fail to improve.  The patient was given clear instructions to go to ER or return to medical center if symptoms don't improve, worsen or new problems develop. The patient verbalized understanding. The patient  was told to call to get lab results if they haven't heard anything in the next week.   This note has been created with Surveyor, quantity. Any transcriptional errors are unintentional.   Maren Reamer, MD, [place] and [facility], [place]   03/06/2016, 11:59 AM

## 2016-03-07 LAB — CERVICOVAGINAL ANCILLARY ONLY: Wet Prep (BD Affirm): POSITIVE — AB

## 2016-03-08 ENCOUNTER — Telehealth: Payer: Self-pay

## 2016-03-08 ENCOUNTER — Other Ambulatory Visit: Payer: Self-pay | Admitting: Internal Medicine

## 2016-03-08 LAB — CYTOLOGY - PAP
Chlamydia: NEGATIVE
Diagnosis: NEGATIVE
HPV (WINDOPATH): NOT DETECTED
NEISSERIA GONORRHEA: NEGATIVE

## 2016-03-08 MED ORDER — METRONIDAZOLE 500 MG PO TABS: 500.0000 mg | ORAL_TABLET | Freq: Two times a day (BID) | ORAL | 0 refills | Status: DC

## 2016-03-08 NOTE — Telephone Encounter (Signed)
Contacted pt to go over lab results pt is aware of results and doesn't have any questions or concerns 

## 2016-03-09 ENCOUNTER — Telehealth: Payer: Self-pay | Admitting: Licensed Clinical Social Worker

## 2016-03-09 NOTE — Telephone Encounter (Signed)
LCSWA contacted pt via telephone. LCSWA introduced self and explained role at [facility]. LCSWA disclosed that she received a consult from Dr. Julien NordmannLangeland to address anxiety.  Pt reported that she is doing "fine". She stated that at her last appointment, she was made aware that her mother was ill. Pt stated that she wasn't feeling well; however, family wanted her to drive a distance to visit mother with no transportation assistance.   LCSWA and pt discussed healthy coping skills to utilize when experiencing stress and/or anxiety. Pt stated that she has started to set healthy boundaries with her adult children which has assisted her in being less anxious.   Pt thanked LCSWA for following up. No additional concerns were reported.   Jenel LucksJasmine Lewis, MSW, LCSWA Clinical Social Worker

## 2016-03-10 ENCOUNTER — Telehealth: Payer: Self-pay

## 2016-03-10 LAB — CERVICOVAGINAL ANCILLARY ONLY: HERPES (WINDOWPATH): NEGATIVE

## 2016-03-10 NOTE — Telephone Encounter (Signed)
Returned pt call to go over lab results again because she didn't fully understand. Pt is aware of results and doesn't have any questions or concerns

## 2016-03-13 ENCOUNTER — Other Ambulatory Visit: Payer: Medicaid Other

## 2016-03-15 ENCOUNTER — Telehealth: Payer: Self-pay

## 2016-03-15 NOTE — Telephone Encounter (Signed)
Pt was called and informed of lab results. 

## 2016-03-21 ENCOUNTER — Ambulatory Visit: Payer: Medicaid Other | Attending: Internal Medicine | Admitting: Pharmacist

## 2016-03-21 DIAGNOSIS — Z794 Long term (current) use of insulin: Secondary | ICD-10-CM | POA: Diagnosis not present

## 2016-03-21 DIAGNOSIS — E119 Type 2 diabetes mellitus without complications: Secondary | ICD-10-CM | POA: Insufficient documentation

## 2016-03-21 DIAGNOSIS — Z79899 Other long term (current) drug therapy: Secondary | ICD-10-CM | POA: Insufficient documentation

## 2016-03-21 LAB — GLUCOSE, POCT (MANUAL RESULT ENTRY): POC Glucose: 164 mg/dl — AB (ref 70–99)

## 2016-03-21 NOTE — Progress Notes (Signed)
    S:    Patient arrives in good spirits.  Presents for diabetes evaluation, education, and management at the request of Dr. Julien NordmannLangeland. Patient was referred on 03/06/16.  Patient was last seen by Primary Care Provider on 03/06/16.   Patient reports adherence with medications.  Current diabetes medications include: metformin 1000 mg BID and Lantus 30 units daily.  Patient denies hypoglycemic events.  Patient reported dietary habits: trying to avoid carbs and salt. She uses Mrs. Dash to season her food and is avoiding breads and added sugar.   Patient reported exercise habits: none   Patient reports nocturia but she believes it is from the HCTZ Patient denies neuropathy. Patient denies visual changes. Patient reports self foot exams. She would like to get diabetic shoes.  Patient reports continued stress at home but has been working with Dr. Julien NordmannLangeland and LCSW for support. She reports that she has been given a notice for jury duty and she feels that she is too stressed to do it and would like to talk to Dr. Julien NordmannLangeland about it.    O:  Lab Results  Component Value Date   HGBA1C 7.4 01/17/2016   There were no vitals filed for this visit.  Home fasting CBG: doesn't check 2 hour post-prandial/random CBG: doesn't check  POCT glucose (post-prandial) = 164   A/P: Diabetes longstanding currently slightly uncontrolled with A1c of 7.4. Unable to assess home CBGs as she does not check her CBGs. Patient denies hypoglycemic events and is able to verbalize appropriate hypoglycemia management plan. Patient reports adherence with medication. Control is suboptimal due to sedentary lifestyle.  Continue Lantus 30 units daily and metformin 1000 mg BID. Encouraged patient to check fasting CBGs daily to allow us to know what her readings are like at home. Patient agreed. Also asked that patient always check her blood glucose if she would ever feel low. I am hopeful that her A1c will be >7 at the next  check due to the efforts she has made to watch what she eats. Might need some post-prandial coverage but will need more home CBGs to determine that. The one from today was a goal.   Next A1C anticipated December 2017.    Written patient instructions provided.  Total time in face to face counseling 20 minutes.   Follow up in Pharmacist Clinic Visit PRN, next visit with Dr. Julien NordmannLangeland for assessment for jury duty (patient wants a doctors note to be dismissed).

## 2016-03-21 NOTE — Patient Instructions (Signed)
Thanks for coming to see me!  Your blood sugar looks great today!  Try to get your blood sugar in the morning before you eat anything. I know some days this will be hard so please try to do it as many days as you can.  Follow up with Dr. Julien NordmannLangeland in 2 weeks    Carbohydrate Counting for Diabetes Mellitus, Adult Carbohydrate counting is a method for keeping track of how many carbohydrates you eat. Eating carbohydrates naturally increases the amount of sugar (glucose) in the blood. Counting how many carbohydrates you eat helps keep your blood glucose within normal limits, which helps you manage your diabetes (diabetes mellitus). It is important to know how many carbohydrates you can safely have in each meal. This is different for every person. A diet and nutrition specialist (registered dietitian) can help you make a meal plan and calculate how many carbohydrates you should have at each meal and snack. Carbohydrates are found in the following foods:  Grains, such as breads and cereals.  Dried beans and soy products.  Starchy vegetables, such as potatoes, peas, and corn.  Fruit and fruit juices.  Milk and yogurt.  Sweets and snack foods, such as cake, cookies, candy, chips, and soft drinks. How do I count carbohydrates? There are two ways to count carbohydrates in food. You can use either of the methods or a combination of both. Reading "Nutrition Facts" on packaged food  The "Nutrition Facts" list is included on the labels of almost all packaged foods and beverages in the [place] It includes:  The serving size.  Information about nutrients in each serving, including the grams (g) of carbohydrate per serving. To use the "Nutrition Facts":  Decide how many servings you will have.  Multiply the number of servings by the number of carbohydrates per serving.  The resulting number is the total amount of carbohydrates that you will be having. Learning standard serving sizes of other foods   When you eat foods containing carbohydrates that are not packaged or do not include "Nutrition Facts" on the label, you need to measure the servings in order to count the amount of carbohydrates:  Measure the foods that you will eat with a food scale or measuring cup, if needed.  Decide how many standard-size servings you will eat.  Multiply the number of servings by 15. Most carbohydrate-rich foods have about 15 g of carbohydrates per serving.  For example, if you eat 8 oz (170 g) of strawberries, you will have eaten 2 servings and 30 g of carbohydrates (2 servings x 15 g = 30 g).  For foods that have more than one food mixed, such as soups and casseroles, you must count the carbohydrates in each food that is included. The following list contains standard serving sizes of common carbohydrate-rich foods. Each of these servings has about 15 g of carbohydrates:   hamburger bun or  English muffin.   oz (15 mL) syrup.   oz (14 g) jelly.  1 slice of bread.  1 six-inch tortilla.  3 oz (85 g) cooked rice or pasta.  4 oz (113 g) cooked dried beans.  4 oz (113 g) starchy vegetable, such as peas, corn, or potatoes.  4 oz (113 g) hot cereal.  4 oz (113 g) mashed potatoes or  of a large baked potato.  4 oz (113 g) canned or frozen fruit.  4 oz (120 mL) fruit juice.  4-6 crackers.  6 chicken nuggets.  6 oz (170 g)  unsweetened dry cereal.  6 oz (170 g) plain fat-free yogurt or yogurt sweetened with artificial sweeteners.  8 oz (240 mL) milk.  8 oz (170 g) fresh fruit or one small piece of fruit.  24 oz (680 g) popped popcorn. Example of carbohydrate counting Sample meal  3 oz (85 g) chicken breast.  6 oz (170 g) brown rice.  4 oz (113 g) corn.  8 oz (240 mL) milk.  8 oz (170 g) strawberries with sugar-free whipped topping. Carbohydrate calculation 1. Identify the foods that contain carbohydrates:  Rice.  Corn.  Milk.  Strawberries. 2. Calculate how  many servings you have of each food:  2 servings rice.  1 serving corn.  1 serving milk.  1 serving strawberries. 3. Multiply each number of servings by 15 g:  2 servings rice x 15 g = 30 g.  1 serving corn x 15 g = 15 g.  1 serving milk x 15 g = 15 g.  1 serving strawberries x 15 g = 15 g. 4. Add together all of the amounts to find the total grams of carbohydrates eaten:  30 g + 15 g + 15 g + 15 g = 75 g of carbohydrates total. This information is not intended to replace advice given to you by your health care provider. Make sure you discuss any questions you have with your health care provider. Document Released: 04/17/2005 Document Revised: 11/05/2015 Document Reviewed: 09/29/2015 Elsevier Interactive Patient Education  2017 [employer]

## 2016-04-04 ENCOUNTER — Encounter: Payer: Self-pay | Admitting: Internal Medicine

## 2016-04-04 ENCOUNTER — Ambulatory Visit: Payer: Medicaid Other | Attending: Internal Medicine | Admitting: Internal Medicine

## 2016-04-04 VITALS — BP 111/71 | HR 85 | Temp 98.1°F | Resp 16 | Wt 195.2 lb

## 2016-04-04 DIAGNOSIS — Z794 Long term (current) use of insulin: Secondary | ICD-10-CM | POA: Diagnosis not present

## 2016-04-04 DIAGNOSIS — Z7982 Long term (current) use of aspirin: Secondary | ICD-10-CM | POA: Diagnosis not present

## 2016-04-04 DIAGNOSIS — F439 Reaction to severe stress, unspecified: Secondary | ICD-10-CM | POA: Insufficient documentation

## 2016-04-04 DIAGNOSIS — E7800 Pure hypercholesterolemia, unspecified: Secondary | ICD-10-CM

## 2016-04-04 DIAGNOSIS — Z880 Allergy status to penicillin: Secondary | ICD-10-CM | POA: Insufficient documentation

## 2016-04-04 DIAGNOSIS — Z658 Other specified problems related to psychosocial circumstances: Secondary | ICD-10-CM

## 2016-04-04 DIAGNOSIS — Z720 Tobacco use: Secondary | ICD-10-CM | POA: Insufficient documentation

## 2016-04-04 DIAGNOSIS — E785 Hyperlipidemia, unspecified: Secondary | ICD-10-CM | POA: Insufficient documentation

## 2016-04-04 DIAGNOSIS — E119 Type 2 diabetes mellitus without complications: Secondary | ICD-10-CM | POA: Insufficient documentation

## 2016-04-04 DIAGNOSIS — Z79899 Other long term (current) drug therapy: Secondary | ICD-10-CM | POA: Insufficient documentation

## 2016-04-04 DIAGNOSIS — F17200 Nicotine dependence, unspecified, uncomplicated: Secondary | ICD-10-CM | POA: Diagnosis not present

## 2016-04-04 DIAGNOSIS — I10 Essential (primary) hypertension: Secondary | ICD-10-CM | POA: Insufficient documentation

## 2016-04-04 LAB — BASIC METABOLIC PANEL WITH GFR
BUN: 15 mg/dL (ref 7–25)
CO2: 29 mmol/L (ref 20–31)
Calcium: 9.4 mg/dL (ref 8.6–10.4)
Chloride: 101 mmol/L (ref 98–110)
Creat: 1.11 mg/dL — ABNORMAL HIGH (ref 0.50–0.99)
GFR, EST AFRICAN AMERICAN: 62 mL/min (ref >=60)
GFR, EST NON AFRICAN AMERICAN: 54 mL/min — AB (ref >=60)
GLUCOSE: 100 mg/dL — AB (ref 65–99)
POTASSIUM: 4.2 mmol/L (ref 3.5–5.3)
Sodium: 136 mmol/L (ref 135–146)

## 2016-04-04 LAB — GLUCOSE, POCT (MANUAL RESULT ENTRY): POC GLUCOSE: 125 mg/dL — AB (ref 70–99)

## 2016-04-04 LAB — POCT GLYCOSYLATED HEMOGLOBIN (HGB A1C): HEMOGLOBIN A1C: 8.3

## 2016-04-04 MED ORDER — METFORMIN HCL 1000 MG PO TABS: 1000.0000 mg | ORAL_TABLET | Freq: Two times a day (BID) | ORAL | 5 refills | Status: DC

## 2016-04-04 MED ORDER — DICLOFENAC SODIUM 1 % TD GEL: 2.0000 g | Freq: Four times a day (QID) | TRANSDERMAL | 2 refills | Status: DC

## 2016-04-04 MED ORDER — LISINOPRIL-HYDROCHLOROTHIAZIDE 20-25 MG PO TABS
1.0000 | ORAL_TABLET | Freq: Every day | ORAL | 3 refills | Status: DC
Start: 2016-04-04 — End: 2016-07-26

## 2016-04-04 MED ORDER — INSULIN GLARGINE 100 UNIT/ML SOLOSTAR PEN: 32.0000 [IU] | PEN_INJECTOR | Freq: Every day | SUBCUTANEOUS | 11 refills | Status: DC

## 2016-04-04 MED ORDER — ASPIRIN 81 MG PO CHEW: 81.0000 mg | CHEWABLE_TABLET | Freq: Every day | ORAL | 3 refills | Status: DC

## 2016-04-04 NOTE — Progress Notes (Signed)
Pt is in the office today for diabetes  

## 2016-04-04 NOTE — Progress Notes (Signed)
Stephanie Neal, is a 60 y.o. female  TDD:[id number]  WCB:[id number]  DOB - 18-Feb-1956  Chief Complaint  Patient presents with  . Diabetes        Subjective:   Stephanie Neal is a 60 y.o. female here today for a follow up visit for dm, htn, dyslipidemia.  She has been watching her diet closely, and taking all meds as prescribed.  Of note, she is still smoking, but slightly less than normal.  She has been recently very stressed.  Her nephew was killed at his home, with his wife, last Thursday. Fortunately, the children 1 and 3yo were spared. [place] arrangements are currently pending.  She has had to be very strong for her family, which is of course causing more stress to her.  She denies anxiety/depression, not interested in an antidepressants, and declined to speak w/ our SW as well.  She asked for letter to avoid jury duty since she mentally does not think she can handle it at this time.   Patient has No headache, No chest pain, No abdominal pain - No Nausea, No new weakness tingling or numbness, No Cough - SOB.  No problems updated.  ALLERGIES: Allergies  Allergen Reactions  . Penicillins Other (See Comments)    "blanked out" Has patient had a PCN reaction causing immediate rash, facial/tongue/throat swelling, SOB or lightheadedness with hypotension: No Has patient had a PCN reaction causing severe rash involving mucus membranes or skin necrosis: No Has patient had a PCN reaction that required hospitalization No Has patient had a PCN reaction occurring within the last 10 years: No If all of the above answers are "NO", then may proceed with Cephalosporin use.     PAST MEDICAL HISTORY: Past Medical History:  Diagnosis Date  . Diabetes mellitus   . Hyperlipidemia   . Hypertension     MEDICATIONS AT HOME: Prior to Admission medications   Medication Sig Start Date End Date Taking? Authorizing Provider  aspirin (ASPIRIN CHILDRENS) 81 MG chewable tablet Chew 1 tablet  (81 mg total) by mouth daily. 04/04/16   Maren Reamer, MD  atorvastatin (LIPITOR) 40 MG tablet TAKE 1 TABLET EVERY DAY 01/13/16   Maren Reamer, MD  bacitracin-polymyxin b (POLYSPORIN) ointment Apply topically 2 (two) times daily. Patient not taking: Reported on 01/17/2016 09/23/15   Janne Napoleon, NP  carbamide peroxide (DEBROX) 6.5 % otic solution Place 5 drops into the right ear 2 (two) times daily. Patient not taking: Reported on 03/06/2016 09/29/15   Maren Reamer, MD  ciprofloxacin-dexamethasone [facility]) otic suspension Place 4 drops into the left ear 2 (two) times daily. Patient not taking: Reported on 03/06/2016 01/17/16   Maren Reamer, MD  cyclobenzaprine (FLEXERIL) 5 MG tablet Take 1 tablet (5 mg total) by mouth 3 (three) times daily as needed for muscle spasms. Patient not taking: Reported on 01/17/2016 09/23/15   Janne Napoleon, NP  diclofenac sodium (VOLTAREN) 1 % GEL Apply 2 g topically 4 (four) times daily. 04/04/16   Maren Reamer, MD  EMBRACE LANCETS ULTRA THIN 61Y MISC 1 applicator by Does not apply route 2 (two) times daily. 03/06/16   Maren Reamer, MD  fluticasone (FLONASE) 50 MCG/ACT nasal spray Place 2 sprays into both nostrils daily. 04/19/15   Lance Bosch, NP  glucose blood test strip Use as instructed 03/06/16   Maren Reamer, MD  glucose monitoring kit (FREESTYLE) monitoring kit 1 each by Does not apply route as needed for other. 12/03/13  Lance Bosch, NP  glucose monitoring kit (FREESTYLE) monitoring kit 1 each by Does not apply route as needed for other. Check blood sugar TID & QHS 12/04/13   Lance Bosch, NP  guaiFENesin (MUCINEX) 600 MG 12 hr tablet Take 1 tablet (600 mg total) by mouth 2 (two) times daily as needed. Patient not taking: Reported on 03/06/2016 01/17/16   Maren Reamer, MD  ibuprofen (ADVIL,MOTRIN) 600 MG tablet Take 1 tablet (600 mg total) by mouth every 6 (six) hours as needed. 08/04/15   [name], PA-C  Insulin Glargine (LANTUS SOLOSTAR)  100 UNIT/ML Solostar Pen Inject 32 Units into the skin daily at 10 pm. 04/04/16   Maren Reamer, MD  Insulin Pen Needle 31G X 8 MM MISC Use lantus nightly for E11.9 01/17/16   Maren Reamer, MD  ketoconazole (NIZORAL) 2 % cream Apply 1 application topically daily. head Patient not taking: Reported on 03/06/2016 01/17/16   Maren Reamer, MD  lisinopril-hydrochlorothiazide (PRINZIDE,ZESTORETIC) 20-25 MG tablet Take 1 tablet by mouth daily. 04/04/16   Maren Reamer, MD  metFORMIN (GLUCOPHAGE) 1000 MG tablet Take 1 tablet (1,000 mg total) by mouth 2 (two) times daily with a meal. For diabetes 04/04/16   Maren Reamer, MD  metroNIDAZOLE (FLAGYL) 500 MG tablet Take 1 tablet (500 mg total) by mouth 2 (two) times daily. 03/08/16   Maren Reamer, MD  senna-docusate (SENOKOT-S) 8.6-50 MG tablet Take 1 tablet by mouth at bedtime as needed for mild constipation. 03/06/16   Maren Reamer, MD     Objective:   Vitals:   04/04/16 1051  BP: 111/71  Pulse: 85  Resp: 16  Temp: 98.1 F (36.7 C)  TempSrc: Oral  SpO2: 95%  Weight: 195 lb 3.2 oz (88.5 kg)    Exam General appearance : Awake, alert, not in any distress. Speech Clear. Not toxic looking, pleasant. HEENT: Atraumatic and Normocephalic, pupils equally reactive to light. Neck: supple, no JVD.  Chest:Good air entry bilaterally, no added sounds. CVS: S1 S2 regular, no murmurs/gallups or rubs. Abdomen: Bowel sounds active, obese, Non tender and not distended with no gaurding, rigidity or rebound. Foot exam: bilateral peripheral pulses 2+ (dorsalis pedis and post tibialis pulses), no ulcers noted/no ecchymosis, warm to touch, monofilament testing 3/3 bilat. Sensation intact.  No c/c/e. Neurology: Awake alert, and oriented X 3, CN II-XII grossly intact, Non focal Skin:No Rash  Data Review Lab Results  Component Value Date   HGBA1C 7.4 01/17/2016   HGBA1C 7.7 09/29/2015   HGBA1C 7.10 04/19/2015    Depression screen PHQ 2/9  04/04/2016 03/06/2016 01/17/2016 09/29/2015 12/17/2014  Decreased Interest 0 0 0 0 0  Down, Depressed, Hopeless 0 0 0 0 0  PHQ - 2 Score 0 0 0 0 0  Altered sleeping - - - 0 -  Tired, decreased energy - - - 0 -  Change in appetite - - - 0 -  Feeling bad or failure about yourself  - - - 0 -  Trouble concentrating - - - 0 -  Moving slowly or fidgety/restless - - - 0 -  Suicidal thoughts - - - 0 -  PHQ-9 Score - - - 0 -  Difficult doing work/chores - - - Not difficult at all -      Assessment & Plan   1. Type 2 diabetes mellitus without complication, with long-term current use of insulin (HCC) Actually worse than prior. Suspect stress playing big part in control  at this time. - educated pt on self-titration q3 days, instructions provided, pt able to repeat to me the instructions afterwards. - POCT glucose (manual entry) - POCT glycosylated hemoglobin (Hb A1C) 8.3 - metFORMIN (GLUCOPHAGE) 1000 MG tablet; Take 1 tablet (1,000 mg total) by mouth 2 (two) times daily with a meal. For diabetes  Dispense: 60 tablet; Refill: 5 - Insulin Glargine (LANTUS SOLOSTAR) 100 UNIT/ML Solostar Pen; Inject 32 Units into the skin daily at 10 pm.  Dispense: 5 pen; Refill: 11 - Ambulatory referral to Ophthalmology - Microalbumin/Creatinine Ratio, Urine - BASIC METABOLIC PANEL WITH GFR - continue asa 81 and statin  2. Hypertension At goal, renewed prinzide 20-25 qd, continue low salt diet  3. hyperlipidimia - renewed atorvastatin  4. tob abuse Complete cessation recd, lots of stressors currently  5. Psychosocial stressors. No si/hi/avh, denies depression/anxiety, just stressed out, not interested in speaking w/ social work today. - letter for jury duty provided as well.  6. Declined pneumovac and flu vac recds.  Patient have been counseled extensively about nutrition and exercise  Return in about 3 months (around 07/03/2016).  The patient was given clear instructions to go to ER or return to medical  center if symptoms don't improve, worsen or new problems develop. The patient verbalized understanding. The patient was told to call to get lab results if they haven't heard anything in the next week.   This note has been created with Surveyor, quantity. Any transcriptional errors are unintentional.   Maren Reamer, MD, [place] [facility] [place], Learned   04/04/2016, 11:11 AM

## 2016-04-04 NOTE — Patient Instructions (Signed)
- [facility] 2 wks /dm chk.   Check blood sugars on waking up daily., and at least 1-2 times rest of day.    Also check blood sugars about 2 hours after a meal and do this after different meals by rotation  Recommended blood sugar levels on waking up is 90-130 and about 2 hours after meal is 130-160  Please bring your blood sugar monitor to each visit, thank you  Restart exercise  Continue cholesterol med.   - Lantus Titration  Instructions:  IF AM blood sugar is  >150, increase lantus by 1 unit at night.  Continue to check sugars daily. On day 3, if blood sugar >150, continue to increase lantus by 1 unit.  If blood sugar < 70s, than need to reduce dose by 1 unit nightly.  Continue to check blood sugars in am daily and adjust per above.  -- please call us if you have any questions -  Diabetes Mellitus and Food It is important for you to manage your blood sugar (glucose) level. Your blood glucose level can be greatly affected by what you eat. Eating healthier foods in the appropriate amounts throughout the day at about the same time each day will help you control your blood glucose level. It can also help slow or prevent worsening of your diabetes mellitus. Healthy eating may even help you improve the level of your blood pressure and reach or maintain a healthy weight. General recommendations for healthful eating and cooking habits include:  Eating meals and snacks regularly. Avoid going long periods of time without eating to lose weight.  Eating a diet that consists mainly of plant-based foods, such as fruits, vegetables, nuts, legumes, and whole grains.  Using low-heat cooking methods, such as baking, instead of high-heat cooking methods, such as deep frying. Work with your dietitian to make sure you understand how to use the Nutrition Facts information on food labels. How can food affect me? Carbohydrates  Carbohydrates affect your blood glucose level more than  any other type of food. Your dietitian will help you determine how many carbohydrates to eat at each meal and teach you how to count carbohydrates. Counting carbohydrates is important to keep your blood glucose at a healthy level, especially if you are using insulin or taking certain medicines for diabetes mellitus. Alcohol  Alcohol can cause sudden decreases in blood glucose (hypoglycemia), especially if you use insulin or take certain medicines for diabetes mellitus. Hypoglycemia can be a life-threatening condition. Symptoms of hypoglycemia (sleepiness, dizziness, and disorientation) are similar to symptoms of having too much alcohol. If your health care provider has given you approval to drink alcohol, do so in moderation and use the following guidelines:  Women should not have more than one drink per day, and men should not have more than two drinks per day. One drink is equal to:  12 oz of beer.  5 oz of wine.  1 oz of hard liquor.  Do not drink on an empty stomach.  Keep yourself hydrated. Have water, diet soda, or unsweetened iced tea.  Regular soda, juice, and other mixers might contain a lot of carbohydrates and should be counted. What foods are not recommended? As you make food choices, it is important to remember that all foods are not the same. Some foods have fewer nutrients per serving than other foods, even though they might have the same number of calories or carbohydrates. It is difficult to get your body what it needs  when you eat foods with fewer nutrients. Examples of foods that you should avoid that are high in calories and carbohydrates but low in nutrients include:  Trans fats (most processed foods list trans fats on the Nutrition Facts label).  Regular soda.  Juice.  Candy.  Sweets, such as cake, pie, doughnuts, and cookies.  Fried foods. What foods can I eat? Eat nutrient-rich foods, which will nourish your body and keep you healthy. The food you should eat  also will depend on several factors, including:  The calories you need.  The medicines you take.  Your weight.  Your blood glucose level.  Your blood pressure level.  Your cholesterol level. You should eat a variety of foods, including:  Protein.  Lean cuts of meat.  Proteins low in saturated fats, such as fish, egg whites, and beans. Avoid processed meats.  Fruits and vegetables.  Fruits and vegetables that may help control blood glucose levels, such as apples, mangoes, and yams.  Dairy products.  Choose fat-free or low-fat dairy products, such as milk, yogurt, and cheese.  Grains, bread, pasta, and rice.  Choose whole grain products, such as multigrain bread, whole oats, and brown rice. These foods may help control blood pressure.  Fats.  Foods containing healthful fats, such as nuts, avocado, olive oil, canola oil, and fish. Does everyone with diabetes mellitus have the same meal plan? Because every person with diabetes mellitus is different, there is not one meal plan that works for everyone. It is very important that you meet with a dietitian who will help you create a meal plan that is just right for you. This information is not intended to replace advice given to you by your health care provider. Make sure you discuss any questions you have with your health care provider. Document Released: 01/12/2005 Document Revised: 09/23/2015 Document Reviewed: 03/14/2013 Elsevier Interactive Patient Education  2017 Elsevier Inc.   -  Diabetes Mellitus and Exercise Exercising regularly is important for your overall health, especially when you have diabetes (diabetes mellitus). Exercising is not only about losing weight. It has many health benefits, such as increasing muscle strength and bone density and reducing body fat and stress. This leads to improved fitness, flexibility, and endurance, all of which result in better overall health. Exercise has additional benefits for  people with diabetes, including:  Reducing appetite.  Helping to lower and control blood glucose.  Lowering blood pressure.  Helping to control amounts of fatty substances (lipids) in the blood, such as cholesterol and triglycerides.  Helping the body to respond better to insulin (improving insulin sensitivity).  Reducing how much insulin the body needs.  Decreasing the risk for heart disease by:  Lowering cholesterol and triglyceride levels.  Increasing the levels of good cholesterol.  Lowering blood glucose levels. What is my activity plan? Your health care provider or certified diabetes educator can help you make a plan for the type and frequency of exercise (activity plan) that works for you. Make sure that you:  Do at least 150 minutes of moderate-intensity or vigorous-intensity exercise each week. This could be brisk walking, biking, or water aerobics.  Do stretching and strength exercises, such as yoga or weightlifting, at least 2 times a week.  Spread out your activity over at least 3 days of the week.  Get some form of physical activity every day.  Do not go more than 2 days in a row without some kind of physical activity.  Avoid being inactive for more than  90 minutes at a time. Take frequent breaks to walk or stretch.  Choose a type of exercise or activity that you enjoy, and set realistic goals.  Start slowly, and gradually increase the intensity of your exercise over time. What do I need to know about managing my diabetes?  Check your blood glucose before and after exercising.  If your blood glucose is higher than 240 mg/dL (16.113.3 mmol/L) before you exercise, check your urine for ketones. If you have ketones in your urine, do not exercise until your blood glucose returns to normal.  Know the symptoms of low blood glucose (hypoglycemia) and how to treat it. Your risk for hypoglycemia increases during and after exercise. Common symptoms of hypoglycemia can  include:  Hunger.  Anxiety.  Sweating and feeling clammy.  Confusion.  Dizziness or feeling light-headed.  Increased heart rate or palpitations.  Blurry vision.  Tingling or numbness around the mouth, lips, or tongue.  Tremors or shakes.  Irritability.  Keep a rapid-acting carbohydrate snack available before, during, and after exercise to help prevent or treat hypoglycemia.  Avoid injecting insulin into areas of the body that are going to be exercised. For example, avoid injecting insulin into:  The arms, when playing tennis.  The legs, when jogging.  Keep records of your exercise habits. Doing this can help you and your health care provider adjust your diabetes management plan as needed. Write down:  Food that you eat before and after you exercise.  Blood glucose levels before and after you exercise.  The type and amount of exercise you have done.  When your insulin is expected to peak, if you use insulin. Avoid exercising at times when your insulin is peaking.  When you start a new exercise or activity, work with your health care provider to make sure the activity is safe for you, and to adjust your insulin, medicines, or food intake as needed.  Drink plenty of water while you exercise to prevent dehydration or heat stroke. Drink enough fluid to keep your urine clear or pale yellow. This information is not intended to replace advice given to you by your health care provider. Make sure you discuss any questions you have with your health care provider. Document Released: 07/08/2003 Document Revised: 11/05/2015 Document Reviewed: 09/27/2015 Elsevier Interactive Patient Education  2017 [employer]

## 2016-04-05 LAB — MICROALBUMIN / CREATININE URINE RATIO: Creatinine, Urine: 43 mg/dL (ref 20–320)

## 2016-04-10 ENCOUNTER — Telehealth: Payer: Self-pay

## 2016-04-10 NOTE — Telephone Encounter (Signed)
Contacted pt to go over lab results pt is aware of results and doesn't have any questions or concerns 

## 2016-04-11 ENCOUNTER — Telehealth: Payer: Self-pay | Admitting: Internal Medicine

## 2016-04-11 NOTE — Telephone Encounter (Signed)
Contacted pt and went over results with her again

## 2016-04-11 NOTE — Telephone Encounter (Signed)
Patient called the office to speak with nurse regarding her lab results. Pt is aware that she spoke with nurse yesterday but she has some concerns. Please follow up.  Thank you.

## 2016-04-12 ENCOUNTER — Other Ambulatory Visit: Payer: Self-pay | Admitting: Internal Medicine

## 2016-04-18 ENCOUNTER — Ambulatory Visit: Payer: Medicaid Other | Attending: Internal Medicine | Admitting: Pharmacist

## 2016-04-18 DIAGNOSIS — E119 Type 2 diabetes mellitus without complications: Secondary | ICD-10-CM | POA: Insufficient documentation

## 2016-04-18 DIAGNOSIS — Z79899 Other long term (current) drug therapy: Secondary | ICD-10-CM | POA: Insufficient documentation

## 2016-04-18 DIAGNOSIS — Z794 Long term (current) use of insulin: Secondary | ICD-10-CM | POA: Diagnosis not present

## 2016-04-18 NOTE — Patient Instructions (Addendum)
Thanks for coming to see me!  Check your blood sugar every morning after you wake up and before you eat  A couple of times a week, get a reading 2 hours AFTER you eat - this will help us to see where you get too high during the day.  When you first wake up, blood sugar should be 80 to 120  2 hours after you eat, blood sugar should be less than 180  Keep up the good work!  Come back and see me in 2-3 weeks. Bring your log book or meter

## 2016-04-18 NOTE — Progress Notes (Signed)
    S:    Patient arrives in good spirits.  Presents for diabetes evaluation, education, and management at the request of Dr. Julien NordmannLangeland. Patient was referred on 03/06/16.  Patient was last seen by Primary Care Provider on 03/06/16.   Patient reports adherence with medications.  Current diabetes medications include: metformin 1000 mg BID and Lantus 30 units daily.  Patient denies hypoglycemic events.  Patient reported dietary habits: trying to avoid carbs and salt. She is eating more fruit but trying to limit the amount because she knows there is sugar in fruit  Patient reported exercise habits: none   Patient reports nocturia but she believes it is from the HCTZ Patient denies neuropathy. Patient denies visual changes. Patient reports self foot exams.    O:  Lab Results  Component Value Date   HGBA1C 8.3 04/04/2016   There were no vitals filed for this visit.  Home fasting CBG: 120s-135 2 hour post-prandial/random CBG: got a 225 one hour after she ate    A/P: Diabetes longstanding currently UNcontrolled with A1c of 8.3 which is up from last A1c of 7.4. Home fastings appear to be slightly above goal - she does not get post-prandials so Patient denies hypoglycemic events and is able to verbalize appropriate hypoglycemia management plan. Patient reports adherence with medication. Control is suboptimal due to sedentary lifestyle.  Continue Lantus 30 units daily and metformin 1000 mg BID. Instructed patient to start obtaining some post-prandial readings. Though her fastings are slightly elevated, I think that she might be more elevated post-prandially and may need more coverage during meals (consider SGLT-2 or DPP-IV if patient not interested in further injectable therapy). However, I would like to see some readings. Once I have those readings, I will adjust therapy as needed. Reviewed blood glucose goals with patient.   Next A1C anticipated March 2018.  Written patient instructions  provided.  Total time in face to face counseling 20 minutes.   Follow up in Pharmacist Clinic Visit in 2-3 weeks with blood glucose log and/or meter.

## 2016-05-04 ENCOUNTER — Ambulatory Visit: Payer: Medicaid Other | Attending: Internal Medicine | Admitting: *Deleted

## 2016-05-04 DIAGNOSIS — Z23 Encounter for immunization: Secondary | ICD-10-CM | POA: Diagnosis not present

## 2016-05-24 ENCOUNTER — Encounter (HOSPITAL_COMMUNITY): Payer: Self-pay | Admitting: Emergency Medicine

## 2016-05-24 ENCOUNTER — Ambulatory Visit (HOSPITAL_COMMUNITY)
Admission: EM | Admit: 2016-05-24 | Discharge: 2016-05-24 | Disposition: A | Discharge status: Home / Self Care | Payer: Medicaid Other | Attending: Emergency Medicine | Admitting: Emergency Medicine

## 2016-05-24 DIAGNOSIS — M76891 Other specified enthesopathies of right lower limb, excluding foot: Secondary | ICD-10-CM

## 2016-05-24 MED ORDER — DICLOFENAC SODIUM 1 % TD GEL: 1.0000 "application " | Freq: Four times a day (QID) | TRANSDERMAL | 0 refills | Status: DC
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
## 2016-05-24 NOTE — Discharge Instructions (Signed)
Wear the knee sleeve most of the time. Apply the diclofenac gel to the area pain 4 times a day. May consider applying cold applications to the side of the knee that hurts the worse. At nighttime when sleeping put a pillow between your knees.

## 2016-05-24 NOTE — ED Provider Notes (Signed)
CSN: [id number]     Arrival date & time 05/24/16  1348 History   None    Chief Complaint  Patient presents with  . Knee Pain   (Consider location/radiation/quality/duration/timing/severity/associated sxs/prior Treatment) 61 year old female complaining of right knee pain for 2 weeks. She states it hurts mostly at night when lying down. When she is ambulating she sometimes feels as though there is a popping sound in the knee as well as a tingling sensation. Denies any known injury. Onset was insidious. He feels weak with ambulation but the greatest amount of pain is at nighttime she is stretching out her knee.      Past Medical History:  Diagnosis Date  . Diabetes mellitus   . Hyperlipidemia   . Hypertension    History reviewed. No pertinent surgical history. Family History  Problem Relation Age of Onset  . Hypertension Mother   . Diabetes Mother   . COPD Father   . Heart disease Father   . Diabetes Father    Social History  Substance Use Topics  . Smoking status: Current Every Day Smoker    Packs/day: 0.25    Types: Cigarettes  . Smokeless tobacco: Not on file  . Alcohol use No   OB History    No data available     Review of Systems  Constitutional: Negative.  Negative for activity change, chills and fever.  HENT: Negative.   Respiratory: Negative.   Cardiovascular: Negative.   Musculoskeletal:       As per HPI  The knee pain is primarily to the lateral aspect of the right knee. No pain anteriorly medially or posteriorly.  Skin: Negative for color change, pallor and rash.  Neurological: Negative.   All other systems reviewed and are negative.   Allergies  Penicillins  Home Medications   Prior to Admission medications   Medication Sig Start Date End Date Taking? Authorizing Provider  aspirin (ASPIRIN CHILDRENS) 81 MG chewable tablet Chew 1 tablet (81 mg total) by mouth daily. 04/04/16   Maren Reamer, MD  atorvastatin (LIPITOR) 40 MG tablet TAKE 1 TABLET  EVERY DAY 04/13/16   Maren Reamer, MD  cyclobenzaprine (FLEXERIL) 5 MG tablet Take 1 tablet (5 mg total) by mouth 3 (three) times daily as needed for muscle spasms. Patient not taking: Reported on 01/17/2016 09/23/15   Janne Napoleon, NP  diclofenac sodium (VOLTAREN) 1 % GEL Apply 1 application topically 4 (four) times daily. 05/24/16   Janne Napoleon, NP  EMBRACE LANCETS ULTRA THIN 53Z MISC 1 applicator by Does not apply route 2 (two) times daily. 03/06/16   Maren Reamer, MD  fluticasone (FLONASE) 50 MCG/ACT nasal spray Place 2 sprays into both nostrils daily. 04/19/15   Lance Bosch, NP  glucose blood test strip Use as instructed 03/06/16   Maren Reamer, MD  glucose monitoring kit (FREESTYLE) monitoring kit 1 each by Does not apply route as needed for other. 12/03/13   Lance Bosch, NP  glucose monitoring kit (FREESTYLE) monitoring kit 1 each by Does not apply route as needed for other. Check blood sugar TID & QHS 12/04/13   Lance Bosch, NP  ibuprofen (ADVIL,MOTRIN) 600 MG tablet Take 1 tablet (600 mg total) by mouth every 6 (six) hours as needed. 08/04/15   [name], PA-C  Insulin Glargine (LANTUS SOLOSTAR) 100 UNIT/ML Solostar Pen Inject 32 Units into the skin daily at 10 pm. 04/04/16   Maren Reamer, MD  Insulin Pen Needle 31G  X 8 MM MISC Use lantus nightly for E11.9 01/17/16   Maren Reamer, MD  lisinopril-hydrochlorothiazide (PRINZIDE,ZESTORETIC) 20-25 MG tablet Take 1 tablet by mouth daily. 04/04/16   Maren Reamer, MD  metFORMIN (GLUCOPHAGE) 1000 MG tablet Take 1 tablet (1,000 mg total) by mouth 2 (two) times daily with a meal. For diabetes 04/04/16   Maren Reamer, MD  metroNIDAZOLE (FLAGYL) 500 MG tablet Take 1 tablet (500 mg total) by mouth 2 (two) times daily. 03/08/16   Maren Reamer, MD  senna-docusate (SENOKOT-S) 8.6-50 MG tablet Take 1 tablet by mouth at bedtime as needed for mild constipation. 03/06/16   Maren Reamer, MD   Meds Ordered and Administered this Visit   Medications - No data to display  BP 103/83 (BP Location: Right Arm)   Pulse 99   Temp 98 F (36.7 C) (Oral)   Resp 20   SpO2 99%  No data found.   Physical Exam  Constitutional: She is oriented to person, place, and time. She appears well-developed and well-nourished. No distress.  HENT:  Head: Normocephalic and atraumatic.  Eyes: EOM are normal.  Neck: Normal range of motion. Neck supple.  Pulmonary/Chest: No respiratory distress.  Musculoskeletal:  Right knee with 180 extension. Election beyond 76. No swelling, tenderness, deformity or discoloration anteriorly, medially or posteriorly. There are 2 areas of minor swelling and tenderness along the lateral aspect of the tibial and femur condyles. There is soft tissue tenderness along the associated tendons. No laxity. Negative drawer, negative for his, negative valgus. Internal and external rotation does not produce pain. No joint line tenderness. No swelling or apparent effusion.  Neurological: She is alert and oriented to person, place, and time. No cranial nerve deficit.  Skin: Skin is warm and dry.  Psychiatric: She has a normal mood and affect.  Nursing note and vitals reviewed.   Urgent Care Course     Procedures (including critical care time)  Labs Review Labs Reviewed - No data to display  Imaging Review No results found.   Visual Acuity Review  Right Eye Distance:   Left Eye Distance:   Bilateral Distance:    Right Eye Near:   Left Eye Near:    Bilateral Near:         MDM   1. Tendinitis of right knee    Wear the knee sleeve most of the time. Apply the diclofenac gel to the area pain 4 times a day. May consider applying cold applications to the side of the knee that hurts the worse. At nighttime when sleeping put a pillow between your knees. Meds ordered this encounter  Medications  . diclofenac sodium (VOLTAREN) 1 % GEL    Sig: Apply 1 application topically 4 (four) times daily.    Dispense:   100 g    Refill:  0    Order Specific Question:   Supervising Provider    Answer:   Carmela Hurt       Janne Napoleon, NP 05/24/16 [phone]

## 2016-06-05 ENCOUNTER — Telehealth: Payer: Self-pay | Admitting: Internal Medicine

## 2016-06-05 DIAGNOSIS — Z1239 Encounter for other screening for malignant neoplasm of breast: Secondary | ICD-10-CM

## 2016-06-05 NOTE — Telephone Encounter (Signed)
Breast center called needing prior auth for breast ultrasound. Please f/up

## 2016-06-05 NOTE — Telephone Encounter (Signed)
Need to call breast center back and ask them the Medicaid prior auth telephone nmber I need to call and case numb.

## 2016-06-06 ENCOUNTER — Telehealth: Payer: Self-pay

## 2016-06-06 NOTE — Telephone Encounter (Signed)
1. [phone], Prior auth for [place] right breast/ case # [id number]; approved   2. Need to call Medicaid ins Berkley Harveyauth [phone] for Tomo Mamm bilat  Apparently I need to sign up for NCID/Nctracs; applied now. (ref # [id number]) - will attempt to do online after set up.

## 2016-06-06 NOTE — Telephone Encounter (Signed)
Contacted [employer] for pre-authorization for Tomo b/l Spoke with Wynn BankerHeather R Case # [id number] gave clinical information per Herbert SetaHeather will hear back in 1 business to see if procedure is approved

## 2016-06-09 ENCOUNTER — Ambulatory Visit
Admission: RE | Admit: 2016-06-09 | Discharge: 2016-06-09 | Disposition: A | Discharge status: Home / Self Care | Payer: Medicaid Other | Source: Ambulatory Visit | Attending: Internal Medicine | Admitting: Internal Medicine

## 2016-06-09 DIAGNOSIS — N63 Unspecified lump in unspecified breast: Secondary | ICD-10-CM

## 2016-06-14 ENCOUNTER — Telehealth: Payer: Self-pay | Admitting: Internal Medicine

## 2016-06-14 ENCOUNTER — Telehealth: Payer: Self-pay

## 2016-06-14 NOTE — Telephone Encounter (Signed)
Contacted pt to go over MM results pt didn't answer lvm asking pt to give me a call at her earliest convenience   If pt calls back please give pt results: Mammogram and ultrasound was negative. Repeat screening in 1 year.

## 2016-06-14 NOTE — Telephone Encounter (Signed)
Patient called requesting results. Per Lawana PaiJay'a, I gave results listed in the encounter notes. Patient stated understanding and hung up.

## 2016-06-18 ENCOUNTER — Other Ambulatory Visit: Payer: Self-pay | Admitting: Internal Medicine

## 2016-07-08 ENCOUNTER — Other Ambulatory Visit: Payer: Self-pay | Admitting: Internal Medicine

## 2016-07-26 ENCOUNTER — Ambulatory Visit: Payer: Medicaid Other | Attending: Internal Medicine | Admitting: Internal Medicine

## 2016-07-26 VITALS — BP 113/70 | HR 84 | Temp 97.8°F | Resp 16 | Wt 197.6 lb

## 2016-07-26 DIAGNOSIS — F17200 Nicotine dependence, unspecified, uncomplicated: Secondary | ICD-10-CM | POA: Diagnosis not present

## 2016-07-26 DIAGNOSIS — M1711 Unilateral primary osteoarthritis, right knee: Secondary | ICD-10-CM

## 2016-07-26 DIAGNOSIS — Z7982 Long term (current) use of aspirin: Secondary | ICD-10-CM | POA: Insufficient documentation

## 2016-07-26 DIAGNOSIS — R350 Frequency of micturition: Secondary | ICD-10-CM | POA: Diagnosis not present

## 2016-07-26 DIAGNOSIS — Z880 Allergy status to penicillin: Secondary | ICD-10-CM | POA: Diagnosis not present

## 2016-07-26 DIAGNOSIS — Z794 Long term (current) use of insulin: Secondary | ICD-10-CM | POA: Insufficient documentation

## 2016-07-26 DIAGNOSIS — E7800 Pure hypercholesterolemia, unspecified: Secondary | ICD-10-CM

## 2016-07-26 DIAGNOSIS — Z23 Encounter for immunization: Secondary | ICD-10-CM

## 2016-07-26 DIAGNOSIS — Z79899 Other long term (current) drug therapy: Secondary | ICD-10-CM | POA: Insufficient documentation

## 2016-07-26 DIAGNOSIS — E118 Type 2 diabetes mellitus with unspecified complications: Secondary | ICD-10-CM | POA: Diagnosis present

## 2016-07-26 DIAGNOSIS — E119 Type 2 diabetes mellitus without complications: Secondary | ICD-10-CM

## 2016-07-26 DIAGNOSIS — Z720 Tobacco use: Secondary | ICD-10-CM | POA: Insufficient documentation

## 2016-07-26 DIAGNOSIS — K5900 Constipation, unspecified: Secondary | ICD-10-CM | POA: Insufficient documentation

## 2016-07-26 DIAGNOSIS — I10 Essential (primary) hypertension: Secondary | ICD-10-CM

## 2016-07-26 LAB — POCT GLYCOSYLATED HEMOGLOBIN (HGB A1C): Hemoglobin A1C: 7.5

## 2016-07-26 LAB — GLUCOSE, POCT (MANUAL RESULT ENTRY): POC Glucose: 151 mg/dl — AB (ref 70–99)

## 2016-07-26 MED ORDER — ATORVASTATIN CALCIUM 40 MG PO TABS: 40.0000 mg | ORAL_TABLET | Freq: Every day | ORAL | 3 refills | Status: DC

## 2016-07-26 MED ORDER — METFORMIN HCL 1000 MG PO TABS: 1000.0000 mg | ORAL_TABLET | Freq: Two times a day (BID) | ORAL | 5 refills | Status: DC

## 2016-07-26 MED ORDER — HYDROCHLOROTHIAZIDE 12.5 MG PO TABS: 12.5000 mg | ORAL_TABLET | Freq: Every day | ORAL | 3 refills | Status: DC

## 2016-07-26 MED ORDER — SIMETHICONE 80 MG PO CHEW: 80.0000 mg | CHEWABLE_TABLET | Freq: Four times a day (QID) | ORAL | 0 refills | Status: DC | PRN

## 2016-07-26 MED ORDER — LISINOPRIL 20 MG PO TABS: 20.0000 mg | ORAL_TABLET | Freq: Every day | ORAL | 3 refills | Status: DC

## 2016-07-26 MED ORDER — ASPIRIN 81 MG PO CHEW: 81.0000 mg | CHEWABLE_TABLET | Freq: Every day | ORAL | 3 refills | Status: DC

## 2016-07-26 MED ORDER — INSULIN GLARGINE 100 UNIT/ML SOLOSTAR PEN: 30.0000 [IU] | PEN_INJECTOR | Freq: Every day | SUBCUTANEOUS | 11 refills | Status: DC

## 2016-07-26 MED ORDER — SENNOSIDES-DOCUSATE SODIUM 8.6-50 MG PO TABS: 1.0000 | ORAL_TABLET | Freq: Every evening | ORAL | 3 refills | Status: DC | PRN

## 2016-07-26 NOTE — Patient Instructions (Addendum)
- ground flax seed - good source of fiber.  - For your tobacco abuse: Strongly recommend that he stop smoking back and all other inhaled products right away Call 1-800-Quit-Now to get free nicotine replacement from the state of [place]   - Pneumococcal Polysaccharide Vaccine: What You Need to Know 1. Why get vaccinated? Vaccination can protect older adults (and some children and younger adults) from pneumococcal disease. Pneumococcal disease is caused by bacteria that can spread from person to person through close contact. It can cause ear infections, and it can also lead to more serious infections of the:  Lungs (pneumonia),  Blood (bacteremia), and  Covering of the brain and spinal cord (meningitis). Meningitis can cause deafness and brain damage, and it can be fatal. Anyone can get pneumococcal disease, but children under 64 years of age, people with certain medical conditions, adults over 42 years of age, and cigarette smokers are at the highest risk. About 18,000 older adults die each year from pneumococcal disease in the [place]. Treatment of pneumococcal infections with penicillin and other drugs used to be more effective. But some strains of the disease have become resistant to these drugs. This makes prevention of the disease, through vaccination, even more important. 2. Pneumococcal polysaccharide vaccine (PPSV23) Pneumococcal polysaccharide vaccine (PPSV23) protects against 23 types of pneumococcal bacteria. It will not prevent all pneumococcal disease. PPSV23 is recommended for:  All adults 32 years of age and older,  Anyone 2 through 62 years of age with certain long-term health problems,  Anyone 2 through 62 years of age with a weakened immune system,  Adults 55 through 62 years of age who smoke cigarettes or have asthma. Most people need only one dose of PPSV. A second dose is recommended for certain high-risk groups. People 33 and older should get a dose even if they  have gotten one or more doses of the vaccine before they turned 65. Your healthcare provider can give you more information about these recommendations. Most healthy adults develop protection within 2 to 3 weeks of getting the shot. 3. Some people should not get this vaccine  Anyone who has had a life-threatening allergic reaction to PPSV should not get another dose.  Anyone who has a severe allergy to any component of PPSV should not receive it. Tell your provider if you have any severe allergies.  Anyone who is moderately or severely ill when the shot is scheduled may be asked to wait until they recover before getting the vaccine. Someone with a mild illness can usually be vaccinated.  Children less than 78 years of age should not receive this vaccine.  There is no evidence that PPSV is harmful to either a pregnant woman or to her fetus. However, as a precaution, women who need the vaccine should be vaccinated before becoming pregnant, if possible. 4. Risks of a vaccine reaction With any medicine, including vaccines, there is a chance of side effects. These are usually mild and go away on their own, but serious reactions are also possible. About half of people who get PPSV have mild side effects, such as redness or pain where the shot is given, which go away within about two days. Less than 1 out of 100 people develop a fever, muscle aches, or more severe local reactions. Problems that could happen after any vaccine:   People sometimes faint after a medical procedure, including vaccination. Sitting or lying down for about 15 minutes can help prevent fainting, and injuries caused by a  fall. Tell your doctor if you feel dizzy, or have vision changes or ringing in the ears.  Some people get severe pain in the shoulder and have difficulty moving the arm where a shot was given. This happens very rarely.  Any medication can cause a severe allergic reaction. Such reactions from a vaccine are very  rare, estimated at about 1 in a million doses, and would happen within a few minutes to a few hours after the vaccination. As with any medicine, there is a very remote chance of a vaccine causing a serious injury or death. The safety of vaccines is always being monitored. For more information, visit: http://floyd.org/ 5. What if there is a serious reaction? What should I look for?  Look for anything that concerns you, such as signs of a severe allergic reaction, very high fever, or unusual behavior. Signs of a severe allergic reaction can include hives, swelling of the face and throat, difficulty breathing, a fast heartbeat, dizziness, and weakness. These would usually start a few minutes to a few hours after the vaccination. What should I do?  If you think it is a severe allergic reaction or other emergency that can't wait, call 9-1-1 or get to the nearest hospital. Otherwise, call your doctor. Afterward, the reaction should be reported to the Vaccine Adverse Event Reporting System (VAERS). Your doctor might file this report, or you can do it yourself through the VAERS web site at www.vaers.LAgents.no, or by calling 1-202-423-4901. VAERS does not give medical advice.  6. How can I learn more?  Ask your doctor. He or she can give you the vaccine package insert or suggest other sources of information.  Call your local or state health department.  Contact the Centers for Disease Control and Prevention (CDC):  Call (425)621-0625 (1-800-CDC-INFO) or  Visit CDC's website at PicCapture.uy CDC Pneumococcal Polysaccharide Vaccine VIS (08/22/13) This information is not intended to replace advice given to you by your health care provider. Make sure you discuss any questions you have with your health care provider. Document Released: 02/12/2006 Document Revised: 01/06/2016 Document Reviewed: 01/06/2016 Elsevier Interactive Patient Education  2017 Elsevier Inc.  - Diabetes Mellitus and  Food It is important for you to manage your blood sugar (glucose) level. Your blood glucose level can be greatly affected by what you eat. Eating healthier foods in the appropriate amounts throughout the day at about the same time each day will help you control your blood glucose level. It can also help slow or prevent worsening of your diabetes mellitus. Healthy eating may even help you improve the level of your blood pressure and reach or maintain a healthy weight. General recommendations for healthful eating and cooking habits include:  Eating meals and snacks regularly. Avoid going long periods of time without eating to lose weight.  Eating a diet that consists mainly of plant-based foods, such as fruits, vegetables, nuts, legumes, and whole grains.  Using low-heat cooking methods, such as baking, instead of high-heat cooking methods, such as deep frying. Work with your dietitian to make sure you understand how to use the Nutrition Facts information on food labels. How can food affect me? Carbohydrates  Carbohydrates affect your blood glucose level more than any other type of food. Your dietitian will help you determine how many carbohydrates to eat at each meal and teach you how to count carbohydrates. Counting carbohydrates is important to keep your blood glucose at a healthy level, especially if you are using insulin or taking certain medicines  for diabetes mellitus. Alcohol  Alcohol can cause sudden decreases in blood glucose (hypoglycemia), especially if you use insulin or take certain medicines for diabetes mellitus. Hypoglycemia can be a life-threatening condition. Symptoms of hypoglycemia (sleepiness, dizziness, and disorientation) are similar to symptoms of having too much alcohol. If your health care provider has given you approval to drink alcohol, do so in moderation and use the following guidelines:  Women should not have more than one drink per day, and men should not have more  than two drinks per day. One drink is equal to:  12 oz of beer.  5 oz of wine.  1 oz of hard liquor.  Do not drink on an empty stomach.  Keep yourself hydrated. Have water, diet soda, or unsweetened iced tea.  Regular soda, juice, and other mixers might contain a lot of carbohydrates and should be counted. What foods are not recommended? As you make food choices, it is important to remember that all foods are not the same. Some foods have fewer nutrients per serving than other foods, even though they might have the same number of calories or carbohydrates. It is difficult to get your body what it needs when you eat foods with fewer nutrients. Examples of foods that you should avoid that are high in calories and carbohydrates but low in nutrients include:  Trans fats (most processed foods list trans fats on the Nutrition Facts label).  Regular soda.  Juice.  Candy.  Sweets, such as cake, pie, doughnuts, and cookies.  Fried foods. What foods can I eat? Eat nutrient-rich foods, which will nourish your body and keep you healthy. The food you should eat also will depend on several factors, including:  The calories you need.  The medicines you take.  Your weight.  Your blood glucose level.  Your blood pressure level.  Your cholesterol level. You should eat a variety of foods, including:  Protein.  Lean cuts of meat.  Proteins low in saturated fats, such as fish, egg whites, and beans. Avoid processed meats.  Fruits and vegetables.  Fruits and vegetables that may help control blood glucose levels, such as apples, mangoes, and yams.  Dairy products.  Choose fat-free or low-fat dairy products, such as milk, yogurt, and cheese.  Grains, bread, pasta, and rice.  Choose whole grain products, such as multigrain bread, whole oats, and brown rice. These foods may help control blood pressure.  Fats.  Foods containing healthful fats, such as nuts, avocado, olive oil,  canola oil, and fish. Does everyone with diabetes mellitus have the same meal plan? Because every person with diabetes mellitus is different, there is not one meal plan that works for everyone. It is very important that you meet with a dietitian who will help you create a meal plan that is just right for you. This information is not intended to replace advice given to you by your health care provider. Make sure you discuss any questions you have with your health care provider. Document Released: 01/12/2005 Document Revised: 09/23/2015 Document Reviewed: 03/14/2013 Elsevier Interactive Patient Education  2017 Elsevier Inc.  -   RICE for Routine Care of Injuries Many injuries can be cared for using rest, ice, compression, and elevation (RICE therapy). Using RICE therapy can help to lessen pain and swelling. It can help your body to heal. Rest  Reduce your normal activities and avoid using the injured part of your body. You can go back to your normal activities when you feel okay and your  doctor says it is okay. Ice  Do not put ice on your bare skin.  Put ice in a plastic bag.  Place a towel between your skin and the bag.  Leave the ice on for 20 minutes, 2-3 times a day. Do this for as long as told by your doctor. Compression  Compression means putting pressure on the injured area. This can be done with an elastic bandage. If an elastic bandage has been applied:  Remove and reapply the bandage every 3-4 hours or as told by your doctor.  Make sure the bandage is not wrapped too tight. Wrap the bandage more loosely if part of your body beyond the bandage is blue, swollen, cold, painful, or loses feeling (numb).  See your doctor if the bandage seems to make your problems worse. Elevation  Elevation means keeping the injured area raised. Raise the injured area above your heart or the center of your chest if you can. When should I get help? You should get help if:  You keep having pain  and swelling.  Your symptoms get worse. Get help right away if: You should get help right away if:  You have sudden bad pain at or below the area of your injury.  You have redness or more swelling around your injury.  You have tingling or numbness at or below the injury that does not go away when you take off the bandage. This information is not intended to replace advice given to you by your health care provider. Make sure you discuss any questions you have with your health care provider. Document Released: 10/04/2007 Document Revised: 03/14/2016 Document Reviewed: 03/25/2014 Elsevier Interactive Patient Education  2017 [employer]   -  Healthy Ways to [place] with Stress  Feeling emotional and nervous or having trouble sleeping and eating can all be normal reactions to stress. Here are some healthy ways you can deal with stress: Take care of yourself.  Eat healthy, well-balanced meals  Exercise on a regular basis  Get plenty of sleep  Give yourself a break if you feel stressed out Talk to others. Share your problems and how you are feeling and coping with a parent, friend, counselor, doctor, or pastor.  Avoid drugs and alcohol. These may seem to help with the stress. But in the long run, they create additional problems and increase the stress you are already feeling.  Take a break. If news events are causing your stress, take a break from listening or watching the news. Recognize when you need more help. If problems continue or you are thinking about suicide, talk to a psychologist, Child psychotherapistsocial worker, or Pharmacist, hospitalprofessional counselor.

## 2016-07-26 NOTE — Progress Notes (Signed)
Stephanie Neal, is a 61 y.o. female  WER:[id number]  PPJ:[id number]  DOB - Mar 27, 1956  Chief Complaint  Patient presents with  . Diabetes  . Knee Pain        Subjective:   Stephanie Neal is a 61 y.o. female here today for a follow up visit, last seen 04/04/16, for htn, dm. Pt overall doing well w/ her meds and diet, however stressors at home include recent death of her Mom this past 05-10-2022, and her family is stressing her out w/ their high demands.  She states she is trying to relax for her health, but they constant bother her w/ their problems. She feels like she needs to move away from her kids and close to her sister to reduce stress.  Had some problems recently w/ right knee tendonitis, seen in ED 05/24/16. Since than, overall better, but when on her feet a long time or sitting to standing may hear popping noises. Has knee brace but not using.  Co of intermittent constipation, and feels and frequent urination. Drinking a lot more water now, and her bladder gets full very quickly. Denies stress/urge incontinence.  Still smoking, mainly due to stress/habit, down to abut 3 cigs/day.   Patient has No headache, No chest pain, No abdominal pain - No Nausea, No new weakness tingling or numbness, No Cough - SOB.  No problems updated.  ALLERGIES: Allergies  Allergen Reactions  . Penicillins Other (See Comments)    "blanked out" Has patient had a PCN reaction causing immediate rash, facial/tongue/throat swelling, SOB or lightheadedness with hypotension: No Has patient had a PCN reaction causing severe rash involving mucus membranes or skin necrosis: No Has patient had a PCN reaction that required hospitalization No Has patient had a PCN reaction occurring within the last 10 years: No If all of the above answers are "NO", then may proceed with Cephalosporin use.     PAST MEDICAL HISTORY: Past Medical History:  Diagnosis Date  . Diabetes mellitus   . Hyperlipidemia   .  Hypertension     MEDICATIONS AT HOME: Prior to Admission medications   Medication Sig Start Date End Date Taking? Authorizing Provider  aspirin (ASPIRIN CHILDRENS) 81 MG chewable tablet Chew 1 tablet (81 mg total) by mouth daily. 07/26/16   Maren Reamer, MD  atorvastatin (LIPITOR) 40 MG tablet Take 1 tablet (40 mg total) by mouth daily. 07/26/16   Maren Reamer, MD  cyclobenzaprine (FLEXERIL) 5 MG tablet Take 1 tablet (5 mg total) by mouth 3 (three) times daily as needed for muscle spasms. Patient not taking: Reported on 01/17/2016 09/23/15   Janne Napoleon, NP  diclofenac sodium (VOLTAREN) 1 % GEL Apply 1 application topically 4 (four) times daily. 05/24/16   Janne Napoleon, NP  EMBRACE LANCETS ULTRA THIN 58K MISC 1 applicator by Does not apply route 2 (two) times daily. 03/06/16   Maren Reamer, MD  fluticasone (FLONASE) 50 MCG/ACT nasal spray Place 2 sprays into both nostrils daily. 04/19/15   Lance Bosch, NP  glucose blood test strip Use as instructed 03/06/16   Maren Reamer, MD  glucose monitoring kit (FREESTYLE) monitoring kit 1 each by Does not apply route as needed for other. 12/03/13   Lance Bosch, NP  glucose monitoring kit (FREESTYLE) monitoring kit 1 each by Does not apply route as needed for other. Check blood sugar TID & QHS 12/04/13   Lance Bosch, NP  hydrochlorothiazide (HYDRODIURIL) 12.5 MG tablet Take 1 tablet (  12.5 mg total) by mouth daily. 07/26/16   Maren Reamer, MD  ibuprofen (ADVIL,MOTRIN) 600 MG tablet Take 1 tablet (600 mg total) by mouth every 6 (six) hours as needed. 08/04/15   [name], PA-C  Insulin Glargine (LANTUS SOLOSTAR) 100 UNIT/ML Solostar Pen Inject 32 Units into the skin daily at 10 pm. 04/04/16   Maren Reamer, MD  Insulin Pen Needle 31G X 8 MM MISC Use lantus nightly for E11.9 01/17/16   Maren Reamer, MD  lisinopril (PRINIVIL,ZESTRIL) 20 MG tablet Take 1 tablet (20 mg total) by mouth daily. 07/26/16   Maren Reamer, MD  metFORMIN (GLUCOPHAGE)  1000 MG tablet Take 1 tablet (1,000 mg total) by mouth 2 (two) times daily with a meal. For diabetes 04/04/16   Maren Reamer, MD  metroNIDAZOLE (FLAGYL) 500 MG tablet Take 1 tablet (500 mg total) by mouth 2 (two) times daily. Patient not taking: Reported on 07/26/2016 03/08/16   Maren Reamer, MD  senna-docusate (SENOKOT-S) 8.6-50 MG tablet Take 1 tablet by mouth at bedtime as needed for mild constipation. 07/26/16   Maren Reamer, MD  simethicone (GAS-X) 80 MG chewable tablet Chew 1 tablet (80 mg total) by mouth every 6 (six) hours as needed for flatulence. 07/26/16   Maren Reamer, MD     Objective:   Vitals:   07/26/16 1059  BP: 113/70  Pulse: 84  Resp: 16  Temp: 97.8 F (36.6 C)  TempSrc: Oral  SpO2: 97%  Weight: 197 lb 9.6 oz (89.6 kg)    Exam General appearance : Awake, alert, not in any distress. Speech Clear. Not toxic looking, pleasant. HEENT: Atraumatic and Normocephalic, pupils equally reactive to light. Neck: supple, no JVD.  Chest:Good air entry bilaterally, no added sounds. CVS: S1 S2 regular, no murmurs/gallups or rubs. Abdomen: Bowel sounds active, Non tender and not distended with no gaurding, rigidity or rebound. Extremities: mild ttp at right knee lateral medialis, bilat knees not swollen/red. B/L Lower Ext shows no edema, both legs are warm to touch Neurology: Awake alert, and oriented X 3, CN II-XII grossly intact, Non focal Skin:No Rash  Data Review Lab Results  Component Value Date   HGBA1C 7.5 07/26/2016   HGBA1C 8.3 04/04/2016   HGBA1C 7.4 01/17/2016    Depression screen PHQ 2/9 07/26/2016 04/04/2016 03/06/2016 01/17/2016 09/29/2015  Decreased Interest 0 0 0 0 0  Down, Depressed, Hopeless 0 0 0 0 0  PHQ - 2 Score 0 0 0 0 0  Altered sleeping - - - - 0  Tired, decreased energy - - - - 0  Change in appetite - - - - 0  Feeling bad or failure about yourself  - - - - 0  Trouble concentrating - - - - 0  Moving slowly or fidgety/restless - - - - 0   Suicidal thoughts - - - - 0  PHQ-9 Score - - - - 0  Difficult doing work/chores - - - - Not difficult at all      Assessment & Plan   1. Type 2 diabetes mellitus with complication, unspecified long term insulin use status ([place]) Doing very well, cautioned her to watch her sugars, we may need to reduce rx soon as she gets more controlled. She knows signs of hypoglycemia to look out for, denies any hypoglycemic events.  - POCT glucose (manual entry) - POCT glycosylated hemoglobin (Hb A1C)  7.5 - reduced lantus to 30 qhs, same metformin for now. -  will ask her to come back in 2-3 wks and see Erline Levine to chk bp and cbgs, watch hypoglycemia  2. TOBACCO ABUSE Total cessation recd. For your tobacco abuse: Strongly recommend that he stop smoking back and all other inhaled products right away Call 1-800-Quit-Now to get free nicotine replacement from the state of [place]    3. Essential hypertension Well controlled currently, will try reducing hctz to see if helps w/ frequent urination since she is so well controlled. - dc prinzide 20-25 qd for now - lisinopril 20 qd - hctz 12.5 qd. - Basic metabolic panel - fu w/ Stacey 2-3 wks for htn check  4. HYPERCHOLESTEROLEMIA Continue asa 81 and statin for cvs protection  5. Stressors  recd pt find other stress reliever methods, walking/exercising more, also talking to her family about her concerns may help as well.   Patient have been counseled extensively about nutrition and exercise  Return in about 3 months (around 10/26/2016), or if symptoms worsen or fail to improve.  The patient was given clear instructions to go to ER or return to medical center if symptoms don't improve, worsen or new problems develop. The patient verbalized understanding. The patient was told to call to get lab results if they haven't heard anything in the next week.   This note has been created with Surveyor, quantity. Any  transcriptional errors are unintentional.   Maren Reamer, MD, [facility] [place], [place]   07/26/2016, 11:44 AM

## 2016-07-27 LAB — BASIC METABOLIC PANEL
BUN / CREAT RATIO: 13 (ref 12–28)
BUN: 13 mg/dL (ref 8–27)
CO2: 26 mmol/L (ref 18–29)
CREATININE: 0.97 mg/dL (ref 0.57–1.00)
Calcium: 9.5 mg/dL (ref 8.7–10.3)
Chloride: 99 mmol/L (ref 96–106)
GFR calc Af Amer: 72 mL/min/{1.73_m2} (ref >59)
GFR, EST NON AFRICAN AMERICAN: 63 mL/min/{1.73_m2} (ref >59)
GLUCOSE: 127 mg/dL — AB (ref 65–99)
Potassium: 4.2 mmol/L (ref 3.5–5.2)
Sodium: 139 mmol/L (ref 134–144)

## 2016-07-31 ENCOUNTER — Telehealth: Payer: Self-pay

## 2016-07-31 NOTE — Telephone Encounter (Signed)
Contacted pt to go over lab results pt is aware of results and doesn't have any questions or concerns 

## 2016-08-17 ENCOUNTER — Ambulatory Visit: Payer: Medicaid Other | Attending: Internal Medicine | Admitting: Pharmacist

## 2016-08-17 VITALS — BP 112/73 | HR 87

## 2016-08-17 DIAGNOSIS — E119 Type 2 diabetes mellitus without complications: Secondary | ICD-10-CM

## 2016-08-17 DIAGNOSIS — Z794 Long term (current) use of insulin: Secondary | ICD-10-CM | POA: Insufficient documentation

## 2016-08-17 DIAGNOSIS — Z79899 Other long term (current) drug therapy: Secondary | ICD-10-CM | POA: Diagnosis not present

## 2016-08-17 DIAGNOSIS — M25559 Pain in unspecified hip: Secondary | ICD-10-CM | POA: Insufficient documentation

## 2016-08-17 DIAGNOSIS — I10 Essential (primary) hypertension: Secondary | ICD-10-CM

## 2016-08-17 NOTE — Progress Notes (Signed)
    S:     Chief Complaint  Patient presents with  . Medication Management    Patient arrives in good spirits.  Presents for diabetes evaluation, education, and management at the request of Dr. Julien Nordmann. Patient was referred on 07/26/16.  Patient was last seen by Primary Care Provider on 07/26/16.   Patient reports adherence with medications.   Current diabetes medications include: Lantus 30 units daily and metformin 1000 mg BID.   Current hypertension medications include: lisinopril 20 mg daily and HCTZ 12.5 mg daily (was on HCTZ 25 mg daily but this was reduced due to frequent urination).  Patient denies hypoglycemic events.  Patient reported dietary habits: eats a low carb diet but would like to learn more about different things she can eat because she is tired of eating the same thing.   Patient denies nocturia.  Patient denies neuropathy. Patient denies visual changes. Patient reports self foot exams.   Patient reports pain on her side. She is not sure if it is from sleeping on her hip wrong.  O:  Physical Exam   ROS   Lab Results  Component Value Date   HGBA1C 7.5 07/26/2016   Vitals:   08/17/16 1119  BP: 112/73  Pulse: 87    Home fasting CBG: low 100s  A/P: Diabetes longstanding currently uncontrolled based on A1c of 7.5. Patient denies hypoglycemic events and is able to verbalize appropriate hypoglycemia management plan. Patient reports adherence with medication.   Continue Lantus 30 units daily. Reviewed blood glucose goals with patient so she better understood what she was shooting for for her fasting and post-prandial numbers. Also provided information on low carb diet that provided ideas on different foods she can eat. Next A1C anticipated July 2018.     Hypertension longstanding currently controlled.  Patient reports adherence with medication. Urinary frequency has improved. Continue current medications as prescribed.  Patient to follow up with Dr.  Julien Nordmann for hip pain.   Written patient instructions provided.  Total time in face to face counseling 20 minutes.   Follow up in Pharmacist Clinic Visit PRN, next visit with Dr. Julien Nordmann.

## 2016-08-17 NOTE — Patient Instructions (Signed)
Thank you for coming to see me!  Your blood sugars and blood pressure look great!  Here are your goals for your blood sugars:  When you first wake up, before you eat: 90 - 120  2 hours after you eat: less than 180  Anything less than 80 is too low so call us if that happens!  Come back to see Dr. Julien Nordmann in 1-2 weeks for hip pain.

## 2016-08-21 ENCOUNTER — Ambulatory Visit (HOSPITAL_COMMUNITY)
Admission: EM | Admit: 2016-08-21 | Discharge: 2016-08-21 | Disposition: A | Discharge status: Home / Self Care | Payer: Medicaid Other | Attending: Family Medicine | Admitting: Family Medicine

## 2016-08-21 ENCOUNTER — Encounter (HOSPITAL_COMMUNITY): Payer: Self-pay | Admitting: Emergency Medicine

## 2016-08-21 DIAGNOSIS — B349 Viral infection, unspecified: Secondary | ICD-10-CM | POA: Diagnosis not present

## 2016-08-21 LAB — POCT I-STAT, CHEM 8
BUN: 15 mg/dL (ref 6–20)
CALCIUM ION: 1.14 mmol/L — AB (ref 1.15–1.40)
CHLORIDE: 98 mmol/L — AB (ref 101–111)
Creatinine, Ser: 1.1 mg/dL — ABNORMAL HIGH (ref 0.44–1.00)
GLUCOSE: 92 mg/dL (ref 65–99)
HCT: 38 % (ref 36.0–46.0)
HEMOGLOBIN: 12.9 g/dL (ref 12.0–15.0)
Potassium: 3.4 mmol/L — ABNORMAL LOW (ref 3.5–5.1)
SODIUM: 136 mmol/L (ref 135–145)
TCO2: 28 mmol/L (ref 0–100)

## 2016-08-21 NOTE — Discharge Instructions (Signed)
Your potassium was 3.4, which is slightly depressed. I recommend eating fresh fruits and vegetables particularly bananas, tomatoes, or dates as these have a high level potassium in them. I think your symptoms are most likely related to viral illness, I recommend rest, plenty of fluids, Tylenol or Motrin as needed for fever, and follow-up with primary care symptoms persist past one week.

## 2016-08-21 NOTE — ED Provider Notes (Signed)
CSN: [id number]     Arrival date & time 08/21/16  1339 History   First MD Initiated Contact with Patient 08/21/16 1439     Chief Complaint  Patient presents with  . Generalized Body Aches   (Consider location/radiation/quality/duration/timing/severity/associated sxs/prior Treatment) 61 year old female presents to clinic for chief complaint of fatigue, body aches, muscle aches, and headaches. Also complaining of congestion, denies any sore throat, nausea, vomiting, diarrhea, or fever. States she was recently seen by her primary care provider and had a adjustment made of her "fluid pill" and also states her "blood sugars have low". The symptoms of fatigue and body aches muscle aches started today, she has not had any over-the-counter medicines for therapies, states she feels better when she is resting.   The history is provided by the patient.    Past Medical History:  Diagnosis Date  . Diabetes mellitus   . Hyperlipidemia   . Hypertension    History reviewed. No pertinent surgical history. Family History  Problem Relation Age of Onset  . Hypertension Mother   . Diabetes Mother   . COPD Father   . Heart disease Father   . Diabetes Father    Social History  Substance Use Topics  . Smoking status: Current Every Day Smoker    Packs/day: 0.25    Types: Cigarettes  . Smokeless tobacco: Never Used  . Alcohol use No   OB History    No data available     Review of Systems  Constitutional: Positive for fatigue. Negative for chills and fever.  HENT: Positive for congestion. Negative for sinus pain, sinus pressure and sore throat.   Eyes: Negative.   Respiratory: Negative.   Cardiovascular: Negative.   Gastrointestinal: Negative.   Genitourinary: Negative.   Musculoskeletal: Positive for arthralgias and myalgias. Negative for neck pain and neck stiffness.  Skin: Negative.   Neurological: Negative.     Allergies  Penicillins  Home Medications   Prior to Admission  medications   Medication Sig Start Date End Date Taking? Authorizing Provider  aspirin (ASPIRIN CHILDRENS) 81 MG chewable tablet Chew 1 tablet (81 mg total) by mouth daily. 07/26/16  Yes Maren Reamer, MD  atorvastatin (LIPITOR) 40 MG tablet Take 1 tablet (40 mg total) by mouth daily. 07/26/16  Yes Maren Reamer, MD  diclofenac sodium (VOLTAREN) 1 % GEL Apply 1 application topically 4 (four) times daily. 05/24/16  Yes Janne Napoleon, NP  fluticasone (FLONASE) 50 MCG/ACT nasal spray Place 2 sprays into both nostrils daily. 04/19/15  Yes Lance Bosch, NP  hydrochlorothiazide (HYDRODIURIL) 12.5 MG tablet Take 1 tablet (12.5 mg total) by mouth daily. 07/26/16  Yes Maren Reamer, MD  Insulin Glargine (LANTUS SOLOSTAR) 100 UNIT/ML Solostar Pen Inject 30 Units into the skin daily at 10 pm. 07/26/16  Yes Maren Reamer, MD  Insulin Pen Needle 31G X 8 MM MISC Use lantus nightly for E11.9 01/17/16  Yes Maren Reamer, MD  lisinopril (PRINIVIL,ZESTRIL) 20 MG tablet Take 1 tablet (20 mg total) by mouth daily. 07/26/16  Yes Maren Reamer, MD  metFORMIN (GLUCOPHAGE) 1000 MG tablet Take 1 tablet (1,000 mg total) by mouth 2 (two) times daily with a meal. For diabetes 07/26/16  Yes Maren Reamer, MD  senna-docusate (SENOKOT-S) 8.6-50 MG tablet Take 1 tablet by mouth at bedtime as needed for mild constipation. 07/26/16  Yes Maren Reamer, MD  simethicone (GAS-X) 80 MG chewable tablet Chew 1 tablet (80 mg total) by mouth every  6 (six) hours as needed for flatulence. 07/26/16  Yes Maren Reamer, MD  EMBRACE LANCETS ULTRA THIN 69V MISC 1 applicator by Does not apply route 2 (two) times daily. 03/06/16   Maren Reamer, MD  glucose blood test strip Use as instructed 03/06/16   Maren Reamer, MD  glucose monitoring kit (FREESTYLE) monitoring kit 1 each by Does not apply route as needed for other. 12/03/13   Lance Bosch, NP  glucose monitoring kit (FREESTYLE) monitoring kit 1 each by Does not apply route as  needed for other. Check blood sugar TID & QHS 12/04/13   Lance Bosch, NP  ibuprofen (ADVIL,MOTRIN) 600 MG tablet Take 1 tablet (600 mg total) by mouth every 6 (six) hours as needed. 08/04/15   [name], PA-C   Meds Ordered and Administered this Visit  Medications - No data to display  BP 91/62 (BP Location: Left Arm)   Pulse (!) 107   Temp 98.6 F (37 C) (Oral)   SpO2 100%  No data found.   Physical Exam  Constitutional: She is oriented to person, place, and time. She appears well-developed and well-nourished. No distress.  HENT:  Head: Normocephalic and atraumatic.  Right Ear: External ear normal.  Left Ear: External ear normal.  Eyes: Conjunctivae are normal. Right eye exhibits no discharge. Left eye exhibits no discharge.  Neck: Normal range of motion.  Cardiovascular: Normal rate and regular rhythm.   Pulmonary/Chest: Effort normal and breath sounds normal.  Abdominal: Soft. Bowel sounds are normal.  Lymphadenopathy:    She has no cervical adenopathy.  Neurological: She is alert and oriented to person, place, and time.  Skin: Skin is warm and dry. Capillary refill takes less than 2 seconds. She is not diaphoretic.  Psychiatric: She has a normal mood and affect. Her behavior is normal.  Nursing note and vitals reviewed.   Urgent Care Course     Procedures (including critical care time)  Labs Review Labs Reviewed  POCT I-STAT, CHEM 8 - Abnormal; Notable for the following:       Result Value   Potassium 3.4 (*)    Chloride 98 (*)    Creatinine, Ser 1.10 (*)    Calcium, Ion 1.14 (*)    All other components within normal limits    Imaging Review No results found.      MDM   1. Viral illness    I-STAT collected, potassium 3.4, chloride 98, creatinine of 1.1 and calcium of 1.14, blood sugar was 92, these readings are consistent with prior laboratory work. Most likely has a viral illness, advise rest, plenty of fluids, Tylenol and Motrin as needed for muscle  aches, bodyaches, or she develops fever. Recommend fresh fruits such as bananas, tomatoes, or dates for potassium, follow-up with primary care provider in one to 2 weeks if symptoms persist     Barnet Glasgow, NP 08/21/16 1557

## 2016-08-21 NOTE — ED Triage Notes (Signed)
Pt woke up at 0300 this morning with cramps and body aches.

## 2016-09-14 ENCOUNTER — Encounter: Payer: Self-pay | Admitting: Internal Medicine

## 2016-09-15 ENCOUNTER — Encounter: Payer: Self-pay | Admitting: Internal Medicine

## 2016-09-18 ENCOUNTER — Encounter: Payer: Self-pay | Admitting: Internal Medicine

## 2016-11-24 ENCOUNTER — Ambulatory Visit: Payer: Medicaid Other | Attending: Internal Medicine | Admitting: Internal Medicine

## 2016-11-24 ENCOUNTER — Encounter: Payer: Self-pay | Admitting: Internal Medicine

## 2016-11-24 ENCOUNTER — Other Ambulatory Visit: Payer: Self-pay | Admitting: Pharmacist

## 2016-11-24 VITALS — BP 121/67 | HR 101 | Temp 97.9°F | Resp 16 | Wt 193.0 lb

## 2016-11-24 DIAGNOSIS — Z8249 Family history of ischemic heart disease and other diseases of the circulatory system: Secondary | ICD-10-CM | POA: Diagnosis not present

## 2016-11-24 DIAGNOSIS — J309 Allergic rhinitis, unspecified: Secondary | ICD-10-CM | POA: Diagnosis not present

## 2016-11-24 DIAGNOSIS — Z79899 Other long term (current) drug therapy: Secondary | ICD-10-CM | POA: Diagnosis not present

## 2016-11-24 DIAGNOSIS — Z7982 Long term (current) use of aspirin: Secondary | ICD-10-CM | POA: Insufficient documentation

## 2016-11-24 DIAGNOSIS — E669 Obesity, unspecified: Secondary | ICD-10-CM | POA: Diagnosis not present

## 2016-11-24 DIAGNOSIS — Z880 Allergy status to penicillin: Secondary | ICD-10-CM | POA: Diagnosis not present

## 2016-11-24 DIAGNOSIS — E7800 Pure hypercholesterolemia, unspecified: Secondary | ICD-10-CM

## 2016-11-24 DIAGNOSIS — E119 Type 2 diabetes mellitus without complications: Secondary | ICD-10-CM

## 2016-11-24 DIAGNOSIS — Z825 Family history of asthma and other chronic lower respiratory diseases: Secondary | ICD-10-CM | POA: Diagnosis not present

## 2016-11-24 DIAGNOSIS — I10 Essential (primary) hypertension: Secondary | ICD-10-CM | POA: Diagnosis not present

## 2016-11-24 DIAGNOSIS — Z833 Family history of diabetes mellitus: Secondary | ICD-10-CM | POA: Insufficient documentation

## 2016-11-24 DIAGNOSIS — F17200 Nicotine dependence, unspecified, uncomplicated: Secondary | ICD-10-CM

## 2016-11-24 DIAGNOSIS — F17210 Nicotine dependence, cigarettes, uncomplicated: Secondary | ICD-10-CM | POA: Insufficient documentation

## 2016-11-24 DIAGNOSIS — Z794 Long term (current) use of insulin: Secondary | ICD-10-CM | POA: Diagnosis not present

## 2016-11-24 LAB — POCT GLYCOSYLATED HEMOGLOBIN (HGB A1C): HEMOGLOBIN A1C: 7.1

## 2016-11-24 LAB — GLUCOSE, POCT (MANUAL RESULT ENTRY): POC Glucose: 129 mg/dl — AB (ref 70–99)

## 2016-11-24 MED ORDER — LISINOPRIL 20 MG PO TABS
20.0000 mg | ORAL_TABLET | Freq: Every day | ORAL | 0 refills | Status: DC
Start: 2016-11-24 — End: 2016-12-23

## 2016-11-24 NOTE — Patient Instructions (Signed)
Please give appointment with Travia in 2 weeks for BP check.  Stop HCTZ (Hydrochlorothiazide).

## 2016-11-24 NOTE — Progress Notes (Signed)
Patient ID: Stephanie Neal, female    DOB: 08-04-1955  MRN: [id number]  CC: Diabetes and re-establish   Subjective: Stephanie Neal is a 61 y.o. female who presents for chronic disease management and to establish care with me Her concerns today include:  Hx of DM, HTN, tob, obesity, HL,  1. RT Knee: -Doing better -exercising knee and Vol gel. Uses Knee brace sometimes around the house. -No falls  2. DM: -not checking BS regular in the past 2 months Eating a lot veggies and uses sweet and low in coffee -tries to stay active with grandkids Due for eye exam but Medicaid does not cover DM eye exam  3. HTN Compliant with HCTZ and lisinopril. Complains of urinating too much when she takes the HCTZ. Wants to know whether this can be discontinued if her blood pressure is good   4.  HL - compliant with Lipitor  5. Tob dep:  -Never called 1 800 quit now -States she is trying very hard to quit and has been cutting back "slowly but surely." 1 pk last a wk.   6.  Worried about her daughter was on drugs. Trying to get her to a rehabilitation program  Patient Active Problem List   Diagnosis Date Noted  . DM type 2 (diabetes mellitus, type 2) ([place]) 03/05/2014  . HYPERCHOLESTEROLEMIA 03/12/2007  . OBESITY 03/12/2007  . TOBACCO ABUSE 03/12/2007  . HTN (hypertension) 03/12/2007  . ALLERGIC RHINITIS, SEASONAL 03/12/2007     Current Outpatient Prescriptions on File Prior to Visit  Medication Sig Dispense Refill  . aspirin (ASPIRIN CHILDRENS) 81 MG chewable tablet Chew 1 tablet (81 mg total) by mouth daily. 90 tablet 3  . atorvastatin (LIPITOR) 40 MG tablet Take 1 tablet (40 mg total) by mouth daily. 90 tablet 3  . diclofenac sodium (VOLTAREN) 1 % GEL Apply 1 application topically 4 (four) times daily. 100 g 0  . EMBRACE LANCETS ULTRA THIN 16B MISC 1 applicator by Does not apply route 2 (two) times daily. 100 each 11  . fluticasone (FLONASE) 50 MCG/ACT nasal spray Place 2 sprays into both  nostrils daily. 16 g 6  . glucose blood test strip Use as instructed 100 each 12  . glucose monitoring kit (FREESTYLE) monitoring kit 1 each by Does not apply route as needed for other. 1 each 0  . glucose monitoring kit (FREESTYLE) monitoring kit 1 each by Does not apply route as needed for other. Check blood sugar TID & QHS 1 each 0  . ibuprofen (ADVIL,MOTRIN) 600 MG tablet Take 1 tablet (600 mg total) by mouth every 6 (six) hours as needed. 30 tablet 0  . Insulin Glargine (LANTUS SOLOSTAR) 100 UNIT/ML Solostar Pen Inject 30 Units into the skin daily at 10 pm. 5 pen 11  . Insulin Pen Needle 31G X 8 MM MISC Use lantus nightly for E11.9 90 each 5  . metFORMIN (GLUCOPHAGE) 1000 MG tablet Take 1 tablet (1,000 mg total) by mouth 2 (two) times daily with a meal. For diabetes 60 tablet 5  . senna-docusate (SENOKOT-S) 8.6-50 MG tablet Take 1 tablet by mouth at bedtime as needed for mild constipation. 90 tablet 3  . simethicone (GAS-X) 80 MG chewable tablet Chew 1 tablet (80 mg total) by mouth every 6 (six) hours as needed for flatulence. 30 tablet 0  . [DISCONTINUED] loratadine (CLARITIN) 10 MG tablet Take 1 tablet (10 mg total) by mouth daily. (Patient not taking: Reported on 06/25/2014) 30 tablet 11  No current facility-administered medications on file prior to visit.     Allergies  Allergen Reactions  . Penicillins Other (See Comments)    "blanked out" Has patient had a PCN reaction causing immediate rash, facial/tongue/throat swelling, SOB or lightheadedness with hypotension: No Has patient had a PCN reaction causing severe rash involving mucus membranes or skin necrosis: No Has patient had a PCN reaction that required hospitalization No Has patient had a PCN reaction occurring within the last 10 years: No If all of the above answers are "NO", then may proceed with Cephalosporin use.     Social History   Social History  . Marital status: Single    Spouse name: N/A  . Number of children:  N/A  . Years of education: N/A   Occupational History  . Not on file.   Social History Main Topics  . Smoking status: Current Every Day Smoker    Packs/day: 0.25    Types: Cigarettes  . Smokeless tobacco: Never Used  . Alcohol use No  . Drug use: No  . Sexual activity: Not on file   Other Topics Concern  . Not on file   Social History Narrative  . No narrative on file    Family History  Problem Relation Age of Onset  . Hypertension Mother   . Diabetes Mother   . COPD Father   . Heart disease Father   . Diabetes Father     No past surgical history on file.  ROS: Review of Systems Negative except as stated above PHYSICAL EXAM: BP 121/67   Pulse (!) 101   Temp 97.9 F (36.6 C) (Oral)   Resp 16   Wt 193 lb (87.5 kg)   SpO2 97%   BMI 26.92 kg/m   Physical Exam General appearance - alert, well appearing, older African-American female and in no distress Mental status - alert, oriented to person, place, and time, normal mood, behavior, speech, dress, motor activity, and thought processes Mouth - mucous membranes moist, pharynx normal without lesions Neck - supple, no significant adenopathy Chest - clear to auscultation, no wheezes, rales or rhonchi, symmetric air entry Heart - normal rate, regular rhythm, normal S1, S2, no murmurs, rubs, clicks or gallops Musculoskeletal - right knee: No edema or point tenderness. Mild crepitus on passive range of motion. No discomfort with passive range of motion Extremities - no lower extremity edema   Depression screen [facility] 2/9 11/24/2016  Decreased Interest 0  Down, Depressed, Hopeless 0  PHQ - 2 Score 0  Altered sleeping -  Tired, decreased energy -  Change in appetite -  Feeling bad or failure about yourself  -  Trouble concentrating -  Moving slowly or fidgety/restless -  Suicidal thoughts -  PHQ-9 Score -  Difficult doing work/chores -   Results for orders placed or performed in visit on 11/24/16  POCT glucose  (manual entry)  Result Value Ref Range   POC Glucose 129 (A) 70 - 99 mg/dl  POCT glycosylated hemoglobin (Hb A1C)  Result Value Ref Range   Hemoglobin A1C 7.1     ASSESSMENT AND PLAN: 1. Controlled type 2 diabetes mellitus without complication, with long-term current use of insulin (HCC) A1c improved compared to last visit. Encourage her to continue healthy eating and regular exercise. Continue Lantus and metformin at current doses - POCT glucose (manual entry) - POCT glycosylated hemoglobin (Hb A1C)  2. Essential hypertension -At goal. -Since blood pressure has been good we will have her stop  the HCTZ.  Continue lisinopril. -Follow-up in 2 weeks for nurse only blood pressure check   3. TOBACCO ABUSE -Continue to encourage her towards smoking cessation.  4. HYPERCHOLESTEROLEMIA Continue Lipitor  Patient was given the opportunity to ask questions.  Patient verbalized understanding of the plan and was able to repeat key elements of the plan.   Orders Placed This Encounter  Procedures  . POCT glucose (manual entry)  . POCT glycosylated hemoglobin (Hb A1C)     Requested Prescriptions    No prescriptions requested or ordered in this encounter    Return in about 4 months (around 03/27/2017).  Karle Plumber, MD, FACP

## 2016-12-11 ENCOUNTER — Ambulatory Visit: Payer: Medicaid Other | Attending: Internal Medicine | Admitting: Pharmacist

## 2016-12-11 VITALS — BP 135/78 | HR 86

## 2016-12-11 DIAGNOSIS — I10 Essential (primary) hypertension: Secondary | ICD-10-CM | POA: Diagnosis present

## 2016-12-11 DIAGNOSIS — Z79899 Other long term (current) drug therapy: Secondary | ICD-10-CM | POA: Insufficient documentation

## 2016-12-11 NOTE — Progress Notes (Signed)
   S:    Patient arrives in good spirits with her daughter.    Presents to the clinic for hypertension evaluation.   Patient reports adherence with medications.  Current BP Medications include:  Lisinopril 20 mg  Antihypertensives tried in the past include: HCTZ ( stopped due to increased urination)  She feels much better since stopping the HCTZ.  O:   Last 3 Office BP readings: BP Readings from Last 3 Encounters:  12/11/16 135/78  11/24/16 121/67  08/21/16 91/62    BMET    Component Value Date/Time   NA 136 08/21/2016 1506   NA 139 07/26/2016 1129   K 3.4 (L) 08/21/2016 1506   CL 98 (L) 08/21/2016 1506   CO2 26 07/26/2016 1129   GLUCOSE 92 08/21/2016 1506   BUN 15 08/21/2016 1506   BUN 13 07/26/2016 1129   CREATININE 1.10 (H) 08/21/2016 1506   CREATININE 1.11 (H) 04/04/2016 1113   CALCIUM 9.5 07/26/2016 1129   GFRNONAA 63 07/26/2016 1129   GFRNONAA 54 (L) 04/04/2016 1113   GFRAA 72 07/26/2016 1129   GFRAA 62 04/04/2016 1113    A/P: Hypertension longstanding currently <140/90 on current medications.  Continued current medications as prescribed - will forward this reading to Dr. Laural BenesJohnson.  Results reviewed and written information provided.   Total time in face-to-face counseling 5 minutes.   F/U Clinic Visit with Dr. Laural BenesJohnson.

## 2016-12-11 NOTE — Patient Instructions (Signed)
Thanks for coming to see me  Follow up with Dr. Laural BenesJohnson as directed.

## 2016-12-23 ENCOUNTER — Other Ambulatory Visit: Payer: Self-pay | Admitting: Internal Medicine

## 2017-01-20 ENCOUNTER — Encounter (HOSPITAL_COMMUNITY): Payer: Self-pay | Admitting: Family Medicine

## 2017-01-20 ENCOUNTER — Ambulatory Visit (HOSPITAL_COMMUNITY)
Admission: EM | Admit: 2017-01-20 | Discharge: 2017-01-20 | Disposition: A | Discharge status: Home / Self Care | Payer: Medicaid Other | Attending: Family | Admitting: Family

## 2017-01-20 DIAGNOSIS — K219 Gastro-esophageal reflux disease without esophagitis: Secondary | ICD-10-CM

## 2017-01-20 MED ORDER — GI COCKTAIL ~~LOC~~
30.0000 mL | Freq: Once | ORAL | Status: AC
  Administered 2017-01-20: 30 mL via ORAL

## 2017-01-20 MED ORDER — GI COCKTAIL ~~LOC~~
ORAL | Status: AC
  Filled 2017-01-20: qty 30

## 2017-01-20 NOTE — ED Provider Notes (Signed)
[facility]    CSN: [id number] Arrival date & time: 01/20/17  1935     History   Chief Complaint Chief Complaint  Patient presents with  . Abdominal Pain    HPI Stephanie Neal is a 61 y.o. female.   Chief complaint epigastric pain 1 week, comes and goes, unchanged. Burning feeling in epigastric area, burping.  Initally thought constipated, took prune juice laxative last night. BM last night.  Eating and drinking normally. No vomiting, fever, cp, sob, dysuria, blood in stool, adominal pain  H/o DM . No hypoghlycemic episodes.       Past Medical History:  Diagnosis Date  . Diabetes mellitus   . Hyperlipidemia   . Hypertension     Patient Active Problem List   Diagnosis Date Noted  . DM type 2 (diabetes mellitus, type 2) ([place]) 03/05/2014  . HYPERCHOLESTEROLEMIA 03/12/2007  . OBESITY 03/12/2007  . TOBACCO ABUSE 03/12/2007  . HTN (hypertension) 03/12/2007  . ALLERGIC RHINITIS, SEASONAL 03/12/2007    History reviewed. No pertinent surgical history.  OB History    No data available       Home Medications    Prior to Admission medications   Medication Sig Start Date End Date Taking? Authorizing Provider  aspirin (ASPIRIN CHILDRENS) 81 MG chewable tablet Chew 1 tablet (81 mg total) by mouth daily. 07/26/16   Maren Reamer, MD  atorvastatin (LIPITOR) 40 MG tablet Take 1 tablet (40 mg total) by mouth daily. 07/26/16   Maren Reamer, MD  diclofenac sodium (VOLTAREN) 1 % GEL Apply 1 application topically 4 (four) times daily. 05/24/16   Janne Napoleon, NP  EMBRACE LANCETS ULTRA THIN 23N MISC 1 applicator by Does not apply route 2 (two) times daily. 03/06/16   Langeland, Leda Quail, MD  fluticasone (FLONASE) 50 MCG/ACT nasal spray Place 2 sprays into both nostrils daily. 04/19/15   Chari Manning A, NP  glucose blood test strip Use as instructed 03/06/16   Lottie Mussel T, MD  glucose monitoring kit (FREESTYLE) monitoring kit 1 each by Does not apply route  as needed for other. 12/03/13   Lance Bosch, NP  glucose monitoring kit (FREESTYLE) monitoring kit 1 each by Does not apply route as needed for other. Check blood sugar TID & QHS 12/04/13   Lance Bosch, NP  ibuprofen (ADVIL,MOTRIN) 600 MG tablet Take 1 tablet (600 mg total) by mouth every 6 (six) hours as needed. 08/04/15   [name], PA-C  Insulin Glargine (LANTUS SOLOSTAR) 100 UNIT/ML Solostar Pen Inject 30 Units into the skin daily at 10 pm. 07/26/16   Maren Reamer, MD  Insulin Pen Needle 31G X 8 MM MISC Use lantus nightly for E11.9 01/17/16   Lottie Mussel T, MD  lisinopril (PRINIVIL,ZESTRIL) 20 MG tablet TAKE 1 TABLET BY MOUTH EVERY DAY 12/25/16   Ladell Pier, MD  metFORMIN (GLUCOPHAGE) 1000 MG tablet Take 1 tablet (1,000 mg total) by mouth 2 (two) times daily with a meal. For diabetes 07/26/16   Lottie Mussel T, MD  senna-docusate (SENOKOT-S) 8.6-50 MG tablet Take 1 tablet by mouth at bedtime as needed for mild constipation. 07/26/16   Maren Reamer, MD  simethicone (GAS-X) 80 MG chewable tablet Chew 1 tablet (80 mg total) by mouth every 6 (six) hours as needed for flatulence. 07/26/16   Maren Reamer, MD    Family History Family History  Problem Relation Age of Onset  . Hypertension Mother   . Diabetes Mother   .  COPD Father   . Heart disease Father   . Diabetes Father     Social History Social History  Substance Use Topics  . Smoking status: Current Every Day Smoker    Packs/day: 0.25    Types: Cigarettes  . Smokeless tobacco: Never Used  . Alcohol use No     Allergies   Penicillins   Review of Systems Review of Systems  Constitutional: Negative for chills and fever.  Respiratory: Negative for cough.   Cardiovascular: Negative for chest pain and palpitations.  Gastrointestinal: Negative for abdominal distention, abdominal pain, constipation, diarrhea, nausea and vomiting.  Genitourinary: Negative for dysuria.     Physical Exam Triage Vital  Signs ED Triage Vitals  Enc Vitals Group     BP 01/20/17 1955 138/69     Pulse Rate 01/20/17 1955 94     Resp 01/20/17 1955 18     Temp 01/20/17 1955 98.3 F (36.8 C)     Temp Source 01/20/17 1955 Oral     SpO2 01/20/17 1955 99 %     Weight --      Height --      Head Circumference --      Peak Flow --      Pain Score 01/20/17 1954 7     Pain Loc --      Pain Edu? --      Excl. in [place]? --    No data found.   Updated Vital Signs BP 138/69   Pulse 94   Temp 98.3 F (36.8 C) (Oral)   Resp 18   SpO2 99%   Visual Acuity Right Eye Distance:   Left Eye Distance:   Bilateral Distance:    Right Eye Near:   Left Eye Near:    Bilateral Near:     Physical Exam  Constitutional: She appears well-developed and well-nourished.  Eyes: Conjunctivae are normal.  Cardiovascular: Normal rate, regular rhythm, normal heart sounds and normal pulses.   Pulmonary/Chest: Effort normal and breath sounds normal. She has no wheezes. She has no rhonchi. She has no rales.  Abdominal: Soft. Normal appearance and bowel sounds are normal. She exhibits no distension, no fluid wave, no ascites and no mass. There is no tenderness. There is no rigidity, no rebound, no guarding and no CVA tenderness.  Neurological: She is alert.  Skin: Skin is warm and dry.  Psychiatric: She has a normal mood and affect. Her speech is normal and behavior is normal. Thought content normal.  Vitals reviewed.    UC Treatments / Results  Labs (all labs ordered are listed, but only abnormal results are displayed) Labs Reviewed - No data to display  EKG  EKG Interpretation None       Radiology No results found.  Procedures Procedures (including critical care time)  Medications Ordered in UC Medications  gi cocktail (Maalox,Lidocaine,Donnatal) (30 mLs Oral Given 01/20/17 2027)     Initial Impression / Assessment and Plan / UC Course  I have reviewed the triage vital signs and the nursing  notes.  Pertinent labs & imaging results that were available during my care of the patient were reviewed by me and considered in my medical decision making (see chart for details).       Final Clinical Impressions(s) / UC Diagnoses   Final diagnoses:  Gastroesophageal reflux disease, esophagitis presence not specified   Afebrile. Patient well-appearing and in no acute distress. Reassured by normal abdominal exam. She 'felt better' after GI cocktail was  given. Advised patient working diagnosis of acid reflux. Advised her to start over-the-counter Zantac. Return precautions given. New Prescriptions New Prescriptions   No medications on file     Controlled Substance Prescriptions [place] Controlled Substance Registry consulted? Not Applicable   Burnard Hawthorne, [facility] 01/20/17 2051

## 2017-01-20 NOTE — ED Triage Notes (Signed)
Pt here for epigastric pain and reflux into throat and mouth for over 1 week.

## 2017-01-20 NOTE — Discharge Instructions (Signed)
Suspect acid reflux as discussed. may trial over-the-counter Zantac 150 mg twice a day, please take an hour before lunch and dinner.  If there is no improvement in your symptoms, or if there is any worsening of symptoms, or if you have any additional concerns, please return for re-evaluation; or, if we are closed, consider going to the Emergency Room for evaluation if symptoms urgent.

## 2017-01-29 ENCOUNTER — Ambulatory Visit: Payer: Medicaid Other | Attending: Internal Medicine | Admitting: *Deleted

## 2017-01-29 DIAGNOSIS — Z23 Encounter for immunization: Secondary | ICD-10-CM | POA: Diagnosis not present

## 2017-02-19 ENCOUNTER — Ambulatory Visit (HOSPITAL_COMMUNITY)
Admission: EM | Admit: 2017-02-19 | Discharge: 2017-02-19 | Disposition: A | Discharge status: Home / Self Care | Payer: Medicaid Other | Attending: Nurse Practitioner | Admitting: Nurse Practitioner

## 2017-02-19 ENCOUNTER — Encounter (HOSPITAL_COMMUNITY): Payer: Self-pay | Admitting: Family Medicine

## 2017-02-19 DIAGNOSIS — R05 Cough: Secondary | ICD-10-CM

## 2017-02-19 DIAGNOSIS — K5900 Constipation, unspecified: Secondary | ICD-10-CM

## 2017-02-19 DIAGNOSIS — R059 Cough, unspecified: Secondary | ICD-10-CM

## 2017-02-19 MED ORDER — DOCUSATE SODIUM 100 MG PO CAPS: 100.0000 mg | ORAL_CAPSULE | Freq: Two times a day (BID) | ORAL | 0 refills | Status: DC

## 2017-02-19 MED ORDER — BENZONATATE 100 MG PO CAPS: 100.0000 mg | ORAL_CAPSULE | Freq: Three times a day (TID) | ORAL | 0 refills | Status: DC

## 2017-02-19 MED ORDER — ALBUTEROL SULFATE HFA 108 (90 BASE) MCG/ACT IN AERS: 1.0000 | INHALATION_SPRAY | Freq: Four times a day (QID) | RESPIRATORY_TRACT | 0 refills | Status: DC | PRN

## 2017-02-19 MED ORDER — PEG 3350-KCL-NABCB-NACL-NASULF 240 G PO SOLR: 4000.0000 mL | Freq: Once | ORAL | 0 refills | Status: AC

## 2017-02-19 MED ORDER — SENNOSIDES-DOCUSATE SODIUM 8.6-50 MG PO TABS: 1.0000 | ORAL_TABLET | Freq: Every day | ORAL | 0 refills | Status: DC

## 2017-02-19 NOTE — Discharge Instructions (Signed)
Start Miralax daily. You may purchase this over-the-counter at pharmacy. Other medications have been sent to your pharmacy. Stop smoking. Follow-up with primary care provider as needed.

## 2017-02-19 NOTE — ED Provider Notes (Signed)
[facility]    CSN: [id number] Arrival date & time: 02/19/17  1217     History   Chief Complaint Chief Complaint  Patient presents with  . Constipation    HPI Stephanie Neal is a 61 y.o. female.   Subjective:   Stephanie Neal is a 61 y.o. female here for evaluation of a cough.  The cough is non-productive, without wheezing, dyspnea or hemoptysis and is aggravated by cold air and dust. Onset of symptoms was 2 days ago and has been unchanged since that time.  Patient does not have a history of asthma. Patient has not had recent travel. Patient does have a history of smoking. Patient has had influenza and pneumonia vaccine this season.   The patient also requests evaluation for constipation. Onset was 2 weeks ago. Patient has been having hard stools since onset. Defecation has been difficult. No history of IBS. Symptoms have waxed and waned but are worse overall. She also endorses some nausea. No vomiting or anorexia. Very mild abdominal pain described as cramping. She is passing gas. She reports being able to tolerate PO without difficulty. Current Health Habits: Eating fiber? Yes. Adequate hydration? Yes. Current over the counter/prescription laxative: 1/2 bottle of fleets emena and chocolate ex-lax which has been not very effective.   The following portions of the patient's history were reviewed and updated as appropriate: allergies, current medications, past family history, past medical history, past social history, past surgical history and problem list.           Past Medical History:  Diagnosis Date  . Diabetes mellitus   . Hyperlipidemia   . Hypertension     Patient Active Problem List   Diagnosis Date Noted  . DM type 2 (diabetes mellitus, type 2) ([place]) 03/05/2014  . HYPERCHOLESTEROLEMIA 03/12/2007  . OBESITY 03/12/2007  . TOBACCO ABUSE 03/12/2007  . HTN (hypertension) 03/12/2007  . ALLERGIC RHINITIS, SEASONAL 03/12/2007    History reviewed. No  pertinent surgical history.  OB History    No data available       Home Medications    Prior to Admission medications   Medication Sig Start Date End Date Taking? Authorizing Provider  aspirin (ASPIRIN CHILDRENS) 81 MG chewable tablet Chew 1 tablet (81 mg total) by mouth daily. 07/26/16   Maren Reamer, MD  atorvastatin (LIPITOR) 40 MG tablet Take 1 tablet (40 mg total) by mouth daily. 07/26/16   Maren Reamer, MD  diclofenac sodium (VOLTAREN) 1 % GEL Apply 1 application topically 4 (four) times daily. 05/24/16   Janne Napoleon, NP  EMBRACE LANCETS ULTRA THIN 42P MISC 1 applicator by Does not apply route 2 (two) times daily. 03/06/16   Langeland, Leda Quail, MD  fluticasone (FLONASE) 50 MCG/ACT nasal spray Place 2 sprays into both nostrils daily. 04/19/15   Chari Manning A, NP  glucose blood test strip Use as instructed 03/06/16   Lottie Mussel T, MD  glucose monitoring kit (FREESTYLE) monitoring kit 1 each by Does not apply route as needed for other. 12/03/13   Lance Bosch, NP  glucose monitoring kit (FREESTYLE) monitoring kit 1 each by Does not apply route as needed for other. Check blood sugar TID & QHS 12/04/13   Lance Bosch, NP  ibuprofen (ADVIL,MOTRIN) 600 MG tablet Take 1 tablet (600 mg total) by mouth every 6 (six) hours as needed. 08/04/15   [name], PA-C  Insulin Glargine (LANTUS SOLOSTAR) 100 UNIT/ML Solostar Pen Inject 30 Units into the skin  daily at 10 pm. 07/26/16   Maren Reamer, MD  Insulin Pen Needle 31G X 8 MM MISC Use lantus nightly for E11.9 01/17/16   Lottie Mussel T, MD  lisinopril (PRINIVIL,ZESTRIL) 20 MG tablet TAKE 1 TABLET BY MOUTH EVERY DAY 12/25/16   Ladell Pier, MD  metFORMIN (GLUCOPHAGE) 1000 MG tablet Take 1 tablet (1,000 mg total) by mouth 2 (two) times daily with a meal. For diabetes 07/26/16   Lottie Mussel T, MD  senna-docusate (SENOKOT-S) 8.6-50 MG tablet Take 1 tablet by mouth at bedtime as needed for mild constipation. 07/26/16   Maren Reamer, MD  simethicone (GAS-X) 80 MG chewable tablet Chew 1 tablet (80 mg total) by mouth every 6 (six) hours as needed for flatulence. 07/26/16   Maren Reamer, MD    Family History Family History  Problem Relation Age of Onset  . Hypertension Mother   . Diabetes Mother   . COPD Father   . Heart disease Father   . Diabetes Father     Social History Social History  Substance Use Topics  . Smoking status: Current Every Day Smoker    Packs/day: 0.25    Types: Cigarettes  . Smokeless tobacco: Never Used  . Alcohol use No     Allergies   Penicillins   Review of Systems Review of Systems  Respiratory: Positive for cough. Negative for shortness of breath and wheezing.   Cardiovascular: Negative for chest pain.  Gastrointestinal: Positive for abdominal pain, constipation and nausea.     Physical Exam Triage Vital Signs ED Triage Vitals  Enc Vitals Group     BP 02/19/17 1254 134/85     Pulse Rate 02/19/17 1254 (!) 105     Resp 02/19/17 1254 (!) 1     Temp 02/19/17 1254 98.3 F (36.8 C)     Temp Source 02/19/17 1254 Oral     SpO2 02/19/17 1254 95 %     Weight 02/19/17 1255 198 lb (89.8 kg)     Height 02/19/17 1255 5' 11" (1.803 m)     Head Circumference --      Peak Flow --      Pain Score --      Pain Loc --      Pain Edu? --      Excl. in [place]? --    No data found.   Updated Vital Signs BP 134/85 (BP Location: Right Arm)   Pulse (!) 105   Temp 98.3 F (36.8 C) (Oral)   Resp (!) 1   Ht 5' 11" (1.803 m)   Wt 198 lb (89.8 kg)   SpO2 95%   BMI 27.62 kg/m   Visual Acuity Right Eye Distance:   Left Eye Distance:   Bilateral Distance:    Right Eye Near:   Left Eye Near:    Bilateral Near:     Physical Exam  Constitutional: She is oriented to person, place, and time. She appears well-developed and well-nourished.  Neck: Normal range of motion. Neck supple.  Cardiovascular: Normal rate, regular rhythm and normal heart sounds.   Pulmonary/Chest:  Effort normal and breath sounds normal. No respiratory distress. She has no wheezes.  Abdominal: Soft. Bowel sounds are normal. She exhibits no distension. There is no tenderness.  Musculoskeletal: Normal range of motion.  Neurological: She is alert and oriented to person, place, and time.  Skin: Skin is warm and dry.  Psychiatric: She has a normal mood and affect.  UC Treatments / Results  Labs (all labs ordered are listed, but only abnormal results are displayed) Labs Reviewed - No data to display  EKG  EKG Interpretation None       Radiology No results found.  Procedures Procedures (including critical care time)  Medications Ordered in UC Medications - No data to display   Initial Impression / Assessment and Plan / UC Course  I have reviewed the triage vital signs and the nursing notes.  Pertinent labs & imaging results that were available during my care of the patient were reviewed by me and considered in my medical decision making (see chart for details).    61 yo AAF that presents with cough and constipation. Explained lack of efficacy of antibiotics in viral disease. Antitussives per medication orders. Avoid exposure to tobacco smoke and fumes. B-agonist inhaler. Call if shortness of breath worsens, blood in sputum, change in character of cough, development of fever or chills, inability to maintain nutrition and hydration. Smoking cessation discussed. Education about constipation causes and treatment discussed. Miralax daily, colace BID and colyte x 1. Follow-up as needed. Strict return precautions discussed.   Discussed diagnosis and treatment with patient. All questions have been answered and all concerns have been addressed. The patient verbalized understanding and had no further questions   Final Clinical Impressions(s) / UC Diagnoses   Final diagnoses:  Constipation, unspecified constipation type  Cough    New Prescriptions New Prescriptions   No  medications on file     Controlled Substance Prescriptions [place] Controlled Substance Registry consulted? Not Applicable   Enrique Sack, [place] 02/19/17 1330

## 2017-02-19 NOTE — ED Triage Notes (Signed)
Pt here for constipation since she started taking zantac. Reports hardness in stomach and bloating. Used fleets enema and chocolate ex lax without relief.  Pt also complaining of URI symptoms.

## 2017-03-21 ENCOUNTER — Other Ambulatory Visit: Payer: Self-pay | Admitting: Pharmacist

## 2017-03-21 MED ORDER — LISINOPRIL 20 MG PO TABS: 20.0000 mg | ORAL_TABLET | Freq: Every day | ORAL | 0 refills | Status: DC

## 2017-05-09 ENCOUNTER — Other Ambulatory Visit: Payer: Self-pay | Admitting: Internal Medicine

## 2017-05-09 MED ORDER — LISINOPRIL 20 MG PO TABS: 20.0000 mg | ORAL_TABLET | Freq: Every day | ORAL | 1 refills | Status: DC

## 2017-05-09 NOTE — Telephone Encounter (Signed)
Pt called to request a refill on  -lisinopril (PRINIVIL,ZESTRIL) 20 MG tablet If approved please send it to  -[id number] - [place], [place] - [address] Please follow up.

## 2017-05-09 NOTE — Addendum Note (Signed)
Addended by: Jonah BlueJOHNSON, Awab Abebe B on: 05/09/2017 12:11 PM   Modules accepted: Orders

## 2017-05-09 NOTE — Telephone Encounter (Signed)
RF sent on Lisinopril as requested.

## 2017-05-24 ENCOUNTER — Encounter: Payer: Self-pay | Admitting: Internal Medicine

## 2017-05-24 ENCOUNTER — Ambulatory Visit: Payer: Medicaid Other | Attending: Internal Medicine | Admitting: Internal Medicine

## 2017-05-24 VITALS — BP 134/88 | HR 95 | Temp 98.1°F | Resp 16 | Ht 71.0 in | Wt 187.8 lb

## 2017-05-24 DIAGNOSIS — Z1231 Encounter for screening mammogram for malignant neoplasm of breast: Secondary | ICD-10-CM

## 2017-05-24 DIAGNOSIS — Z7982 Long term (current) use of aspirin: Secondary | ICD-10-CM | POA: Insufficient documentation

## 2017-05-24 DIAGNOSIS — IMO0001 Reserved for inherently not codable concepts without codable children: Secondary | ICD-10-CM

## 2017-05-24 DIAGNOSIS — Z8249 Family history of ischemic heart disease and other diseases of the circulatory system: Secondary | ICD-10-CM | POA: Diagnosis not present

## 2017-05-24 DIAGNOSIS — Z880 Allergy status to penicillin: Secondary | ICD-10-CM | POA: Diagnosis not present

## 2017-05-24 DIAGNOSIS — Z794 Long term (current) use of insulin: Secondary | ICD-10-CM | POA: Diagnosis not present

## 2017-05-24 DIAGNOSIS — E1165 Type 2 diabetes mellitus with hyperglycemia: Secondary | ICD-10-CM

## 2017-05-24 DIAGNOSIS — I10 Essential (primary) hypertension: Secondary | ICD-10-CM

## 2017-05-24 DIAGNOSIS — E669 Obesity, unspecified: Secondary | ICD-10-CM | POA: Insufficient documentation

## 2017-05-24 DIAGNOSIS — E785 Hyperlipidemia, unspecified: Secondary | ICD-10-CM

## 2017-05-24 DIAGNOSIS — F17210 Nicotine dependence, cigarettes, uncomplicated: Secondary | ICD-10-CM | POA: Insufficient documentation

## 2017-05-24 DIAGNOSIS — Z825 Family history of asthma and other chronic lower respiratory diseases: Secondary | ICD-10-CM | POA: Insufficient documentation

## 2017-05-24 DIAGNOSIS — E7800 Pure hypercholesterolemia, unspecified: Secondary | ICD-10-CM | POA: Diagnosis not present

## 2017-05-24 DIAGNOSIS — R634 Abnormal weight loss: Secondary | ICD-10-CM | POA: Diagnosis not present

## 2017-05-24 DIAGNOSIS — Z1239 Encounter for other screening for malignant neoplasm of breast: Secondary | ICD-10-CM

## 2017-05-24 DIAGNOSIS — Z833 Family history of diabetes mellitus: Secondary | ICD-10-CM | POA: Diagnosis not present

## 2017-05-24 DIAGNOSIS — Z79899 Other long term (current) drug therapy: Secondary | ICD-10-CM | POA: Insufficient documentation

## 2017-05-24 DIAGNOSIS — F17200 Nicotine dependence, unspecified, uncomplicated: Secondary | ICD-10-CM

## 2017-05-24 DIAGNOSIS — E119 Type 2 diabetes mellitus without complications: Secondary | ICD-10-CM | POA: Insufficient documentation

## 2017-05-24 LAB — GLUCOSE, POCT (MANUAL RESULT ENTRY): POC Glucose: 134 mg/dl — AB (ref 70–99)

## 2017-05-24 LAB — POCT GLYCOSYLATED HEMOGLOBIN (HGB A1C): Hemoglobin A1C: 7.2

## 2017-05-24 MED ORDER — GLUCOSE BLOOD VI STRP: ORAL_STRIP | 12 refills | Status: DC

## 2017-05-24 MED ORDER — LISINOPRIL 20 MG PO TABS: 20.0000 mg | ORAL_TABLET | Freq: Every day | ORAL | 1 refills | Status: DC

## 2017-05-24 MED ORDER — METFORMIN HCL 1000 MG PO TABS: 1000.0000 mg | ORAL_TABLET | Freq: Two times a day (BID) | ORAL | 5 refills | Status: DC

## 2017-05-24 MED ORDER — GLIMEPIRIDE 2 MG PO TABS: 1.0000 mg | ORAL_TABLET | Freq: Every day | ORAL | 3 refills | Status: DC

## 2017-05-24 MED ORDER — ATORVASTATIN CALCIUM 40 MG PO TABS: 40.0000 mg | ORAL_TABLET | Freq: Every day | ORAL | 3 refills | Status: DC

## 2017-05-24 MED ORDER — FREESTYLE SYSTEM KIT: 1.0000 | PACK | 0 refills | Status: DC | PRN

## 2017-05-24 NOTE — Patient Instructions (Signed)
Give appointment with Alfonso EllisStacy Hammer in 2 weeks for blood sugar recheck.   Check blood sugars once a day.  Bring in readings on visit with clinical pharmacist in 2 weeks.  Continue Metformin.  Add Amaryl.
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
## 2017-05-24 NOTE — Progress Notes (Signed)
Patient ID: Stephanie Neal, female    DOB: 09-Dec-1955  MRN: [id number]  CC: Diabetes and Hypertension   Subjective: Stephanie Neal is a 62 y.o. female who presents for chronic ds management.  Last seen 10/2016. Her concerns today include:  Hx of DM, HTN, tob, obesity, HL,  1.  DM: damage glucometer months ago, so unable to check BS  Med: compliant with Metformin.  Out of Lantus 3 mths. States she had to discard her insulin after her power was out for 3 days; felt the pens were no good after that.  Also she applied for insurance with [employer] but was denied because she was on insulin.  She would like to discontinue insulin if blood sugar and A1c are good today having been off of Lantus for 3 months. Eating Habits;  Doing better with eating habits.  Drinks mainly water and occasionally milk. Staying away from sweets and fried foods. Exercise: does stretching exercises every day  2.  HTN:  Compliant with Lisinopril.  Denies chest pains or shortness of breath.  No lower extremity edema.  3.  HL:  Taking Lipittor  4.  Tobacco dep:  Down to 3 cig/day  Loss 11 lbs since last visit.  She feels it is due to change in eating habits -denies chronic cough/SOB -reports regular BMs 1-2 x a day.  No blood in stools.  Had c-scope in 2015 -passing urine ok.  No blood in urine Due for MMG  Patient Active Problem List   Diagnosis Date Noted  . DM type 2 (diabetes mellitus, type 2) ([place]) 03/05/2014  . HYPERCHOLESTEROLEMIA 03/12/2007  . OBESITY 03/12/2007  . TOBACCO ABUSE 03/12/2007  . HTN (hypertension) 03/12/2007  . ALLERGIC RHINITIS, SEASONAL 03/12/2007     Current Outpatient Medications on File Prior to Visit  Medication Sig Dispense Refill  . aspirin (ASPIRIN CHILDRENS) 81 MG chewable tablet Chew 1 tablet (81 mg total) by mouth daily. 90 tablet 3  . diclofenac sodium (VOLTAREN) 1 % GEL Apply 1 application topically 4 (four) times daily. 100 g 0  . fluticasone (FLONASE) 50 MCG/ACT nasal spray  Place 2 sprays into both nostrils daily. 16 g 6  . albuterol (PROVENTIL HFA;VENTOLIN HFA) 108 (90 Base) MCG/ACT inhaler Inhale 1-2 puffs into the lungs every 6 (six) hours as needed for wheezing or shortness of breath. (Patient not taking: Reported on 05/24/2017) 1 Inhaler 0  . EMBRACE LANCETS ULTRA THIN 77L MISC 1 applicator by Does not apply route 2 (two) times daily. (Patient not taking: Reported on 05/24/2017) 100 each 11  . [DISCONTINUED] loratadine (CLARITIN) 10 MG tablet Take 1 tablet (10 mg total) by mouth daily. (Patient not taking: Reported on 06/25/2014) 30 tablet 11   No current facility-administered medications on file prior to visit.     Allergies  Allergen Reactions  . Penicillins Other (See Comments)    "blanked out" Has patient had a PCN reaction causing immediate rash, facial/tongue/throat swelling, SOB or lightheadedness with hypotension: No Has patient had a PCN reaction causing severe rash involving mucus membranes or skin necrosis: No Has patient had a PCN reaction that required hospitalization No Has patient had a PCN reaction occurring within the last 10 years: No If all of the above answers are "NO", then may proceed with Cephalosporin use.     Social History   Socioeconomic History  . Marital status: Single    Spouse name: Not on file  . Number of children: Not on file  . Years  of education: Not on file  . Highest education level: Not on file  Social Needs  . Financial resource strain: Not on file  . Food insecurity - worry: Not on file  . Food insecurity - inability: Not on file  . Transportation needs - medical: Not on file  . Transportation needs - non-medical: Not on file  Occupational History  . Not on file  Tobacco Use  . Smoking status: Current Every Day Smoker    Packs/day: 0.25    Types: Cigarettes  . Smokeless tobacco: Never Used  Substance and Sexual Activity  . Alcohol use: No  . Drug use: No  . Sexual activity: Not on file  Other Topics  Concern  . Not on file  Social History Narrative  . Not on file    Family History  Problem Relation Age of Onset  . Hypertension Mother   . Diabetes Mother   . COPD Father   . Heart disease Father   . Diabetes Father     No past surgical history on file.  ROS: Review of Systems Loss 11 lbs since last visit.  She feels it is due to change in eating habits -denies chronic cough/SOB -reports regular BMs 1-2 x a day.  No blood in stools.  Had c-scope in 2015 -passing urine ok.  No blood in urine Due for MMG PHYSICAL EXAM: BP 134/88   Pulse 95   Temp 98.1 F (36.7 C) (Oral)   Resp 16   Ht _0  (1.803 m)   Wt 187 lb 12.8 oz (85.2 kg)   SpO2 98%   BMI 26.19 kg/m   Wt Readings from Last 3 Encounters:  05/24/17 187 lb 12.8 oz (85.2 kg)  02/19/17 198 lb (89.8 kg)  11/24/16 193 lb (87.5 kg)  BP 134/88  Physical Exam General appearance - alert, well appearing, and in no distress Mental status - alert, oriented to person, place, and time, normal mood, behavior, speech, dress, motor activity, and thought processes Eyes -muddy sclera.  Pink conjunctiva.   Nose - normal and patent, no erythema, discharge or polyps Mouth - mucous membranes moist, pharynx normal without lesions Neck - supple, no significant adenopathy Chest - clear to auscultation, no wheezes, rales or rhonchi, symmetric air entry Heart - normal rate, regular rhythm, normal S1, S2, no murmurs, rubs, clicks or gallops Extremities - peripheral pulses normal, no pedal edema, no clubbing or cyanosis Diabetic Foot Exam - Simple   Simple Foot Form Visual Inspection No deformities, no ulcerations, no other skin breakdown bilaterally:  Yes Sensation Testing Intact to touch and monofilament testing bilaterally:  Yes Pulse Check Posterior Tibialis and Dorsalis pulse intact bilaterally:  Yes Comments      Results for orders placed or performed in visit on 05/24/17  POCT glucose (manual entry)  Result Value Ref  Range   POC Glucose 134 (A) 70 - 99 mg/dl  POCT glycosylated hemoglobin (Hb A1C)  Result Value Ref Range   Hemoglobin A1C 7.2     ASSESSMENT AND PLAN: 1. Type 2 diabetes mellitus without complication ([place]) Close to goal considering she has been off Lantus for 3 mths.  She will stay off of the Lantus.  Continue metformin.  Add low-dose Amaryl.  Check blood sugars at least once a day before breakfast or dinner and bring in readings in 2 weeks to meet with clinical pharmacist. -She requested prescription for diabetic testing supplies. - POCT glucose (manual entry) - POCT glycosylated hemoglobin (Hb A1C) -  CBC - Lipid panel - Comprehensive metabolic panel - Microalbumin / creatinine urine ratio - glucose monitoring kit (FREESTYLE) monitoring kit; 1 each by Does not apply route as needed for other.  Dispense: 1 each; Refill: 0 - metFORMIN (GLUCOPHAGE) 1000 MG tablet; Take 1 tablet (1,000 mg total) by mouth 2 (two) times daily with a meal. For diabetes  Dispense: 60 tablet; Refill: 5  2. TOBACCO ABUSE Commended her on her progress and trying to quit.  She will continue to cut back until she is quit completely  3. Essential hypertension Repeat blood pressure at goal.  Continue lisinopril.  4. Hyperlipidemia, unspecified hyperlipidemia type Continue Lipitor  5. Recent unexplained weight loss Patient reports that this is intentional weight loss however we will still check baseline blood work today including TSH. -She needs a mammogram to become up-to-date on appropriate cancer screening for age - TSH  6. Breast cancer screening - MM Digital Screening; Future  Patient was given the opportunity to ask questions.  Patient verbalized understanding of the plan and was able to repeat key elements of the plan.   Orders Placed This Encounter  Procedures  . MM Digital Screening  . CBC  . Lipid panel  . Comprehensive metabolic panel  . Microalbumin / creatinine urine ratio  . TSH  . POCT  glucose (manual entry)  . POCT glycosylated hemoglobin (Hb A1C)     Requested Prescriptions   Signed Prescriptions Disp Refills  . atorvastatin (LIPITOR) 40 MG tablet 90 tablet 3    Sig: Take 1 tablet (40 mg total) by mouth daily.  Marland Kitchen lisinopril (PRINIVIL,ZESTRIL) 20 MG tablet 30 tablet 1    Sig: Take 1 tablet (20 mg total) by mouth daily.  Marland Kitchen glucose blood test strip 100 each 12    Sig: Check BS once daily. Stripes for  Free style meter  . glucose monitoring kit (FREESTYLE) monitoring kit 1 each 0    Sig: 1 each by Does not apply route as needed for other.  . glimepiride (AMARYL) 2 MG tablet 30 tablet 3    Sig: Take 0.5 tablets (1 mg total) by mouth daily before breakfast.  . metFORMIN (GLUCOPHAGE) 1000 MG tablet 60 tablet 5    Sig: Take 1 tablet (1,000 mg total) by mouth 2 (two) times daily with a meal. For diabetes    Return in about 3 months (around 08/22/2017).  Karle Plumber, MD, FACP

## 2017-05-25 LAB — COMPREHENSIVE METABOLIC PANEL
ALT: 8 IU/L (ref 0–32)
AST: 13 IU/L (ref 0–40)
Albumin/Globulin Ratio: 1.9 (ref 1.2–2.2)
Albumin: 4.1 g/dL (ref 3.6–4.8)
Alkaline Phosphatase: 61 IU/L (ref 39–117)
BUN/Creatinine Ratio: 10 — ABNORMAL LOW (ref 12–28)
BUN: 9 mg/dL (ref 8–27)
Bilirubin Total: 0.2 mg/dL (ref 0.0–1.2)
CALCIUM: 9.6 mg/dL (ref 8.7–10.3)
CO2: 26 mmol/L (ref 20–29)
CREATININE: 0.94 mg/dL (ref 0.57–1.00)
Chloride: 102 mmol/L (ref 96–106)
GFR calc Af Amer: 75 mL/min/{1.73_m2} (ref >59)
GFR, EST NON AFRICAN AMERICAN: 65 mL/min/{1.73_m2} (ref >59)
GLUCOSE: 128 mg/dL — AB (ref 65–99)
Globulin, Total: 2.2 g/dL (ref 1.5–4.5)
POTASSIUM: 4.4 mmol/L (ref 3.5–5.2)
Sodium: 142 mmol/L (ref 134–144)
TOTAL PROTEIN: 6.3 g/dL (ref 6.0–8.5)

## 2017-05-25 LAB — MICROALBUMIN / CREATININE URINE RATIO
Creatinine, Urine: 41.8 mg/dL
MICROALB/CREAT RATIO: 24.2 mg/g{creat} (ref 0.0–30.0)
MICROALBUM., U, RANDOM: 10.1 ug/mL

## 2017-05-25 LAB — CBC
HEMATOCRIT: 39.3 % (ref 34.0–46.6)
HEMOGLOBIN: 13.6 g/dL (ref 11.1–15.9)
MCH: 27.3 pg (ref 26.6–33.0)
MCHC: 34.6 g/dL (ref 31.5–35.7)
MCV: 79 fL (ref 79–97)
Platelets: 221 10*3/uL (ref 150–379)
RBC: 4.99 x10E6/uL (ref 3.77–5.28)
RDW: 15.3 % (ref 12.3–15.4)
WBC: 4.8 10*3/uL (ref 3.4–10.8)

## 2017-05-25 LAB — LIPID PANEL
CHOL/HDL RATIO: 3.5 ratio (ref 0.0–4.4)
Cholesterol, Total: 171 mg/dL (ref 100–199)
HDL: 49 mg/dL (ref >39)
LDL CALC: 103 mg/dL — AB (ref 0–99)
Triglycerides: 94 mg/dL (ref 0–149)
VLDL Cholesterol Cal: 19 mg/dL (ref 5–40)

## 2017-05-25 LAB — TSH: TSH: 0.833 u[IU]/mL (ref 0.450–4.500)

## 2017-05-29 ENCOUNTER — Telehealth: Payer: Self-pay

## 2017-05-29 NOTE — Telephone Encounter (Signed)
Contacted pt to go over lab results pt is aware and doesn't have any questions or concerns 

## 2017-06-07 ENCOUNTER — Encounter: Payer: Medicaid Other | Admitting: Pharmacist

## 2017-06-14 ENCOUNTER — Encounter: Payer: Self-pay | Admitting: Pharmacist

## 2017-06-14 ENCOUNTER — Ambulatory Visit: Payer: Medicaid Other | Attending: Internal Medicine | Admitting: Pharmacist

## 2017-06-14 DIAGNOSIS — E119 Type 2 diabetes mellitus without complications: Secondary | ICD-10-CM | POA: Insufficient documentation

## 2017-06-14 DIAGNOSIS — E1165 Type 2 diabetes mellitus with hyperglycemia: Secondary | ICD-10-CM

## 2017-06-14 DIAGNOSIS — Z7984 Long term (current) use of oral hypoglycemic drugs: Secondary | ICD-10-CM | POA: Diagnosis not present

## 2017-06-14 DIAGNOSIS — IMO0001 Reserved for inherently not codable concepts without codable children: Secondary | ICD-10-CM

## 2017-06-14 NOTE — Progress Notes (Signed)
    S:     No chief complaint on file.   Patient arrives in good spirits.  Presents for diabetes evaluation, education, and management at the request of Dr. Laural BenesJohnson. Patient was referred on 05/24/17.  Patient was last seen by Primary Care Provider on 05/24/17.   Patient reports adherence with medications.  Current diabetes medications include: metformin 1000 mg BID, glimepiride 1 mg daily. Stopped Lantus about 3-4 months ago.   Patient denies hypoglycemic events. Denies any adverse effects from her medications. Feels like she is doing very well.    O:  Physical Exam   ROS   Lab Results  Component Value Date   HGBA1C 7.2 05/24/2017   There were no vitals filed for this visit.  Home fasting CBG: 108-159 (most<130) 2 hour post-prandial/random CBG: none.   A/P: Diabetes longstanding currently slightly above goal at 7.2. Patient denies hypoglycemic events and is able to verbalize appropriate hypoglycemia management plan. Patient reports adherence with medication. Control is suboptimal due to dietary indiscretion and sedentary lifestyle.  Most fastings are at goal, no changes to any medications at this time. Patient to follow up with me if she notices multiple readings >140, otherwise, she can follow up with PCP as directed in April. Encouraged her to see what types of foods make sure blood sugars go up so that she can avoid those foods or eat smaller portions of them. Patient verbalized understanding.  Next A1C anticipated April 2019.    Written patient instructions provided.  Total time in face to face counseling 10 minutes. Follow up in Pharmacist Clinic Visit PRN.   Patient seen with Enid DerryVictoria Mitchell, PharmD Candidate

## 2017-06-14 NOTE — Patient Instructions (Signed)
Thanks for coming to see us!  Continue current medication  If you notice that your blood sugars in the morning are consistently greater than 140, come back and see me  Otherwise, follow up with Dr. Laural BenesJohnson in 2 months as scheduled.

## 2017-07-19 ENCOUNTER — Ambulatory Visit
Admission: RE | Admit: 2017-07-19 | Discharge: 2017-07-19 | Disposition: A | Discharge status: Home / Self Care | Payer: Medicaid Other | Source: Ambulatory Visit | Attending: Internal Medicine | Admitting: Internal Medicine

## 2017-07-19 DIAGNOSIS — Z1239 Encounter for other screening for malignant neoplasm of breast: Secondary | ICD-10-CM

## 2017-08-23 ENCOUNTER — Ambulatory Visit: Payer: Medicaid Other | Admitting: Internal Medicine

## 2017-08-24 ENCOUNTER — Ambulatory Visit: Payer: Medicaid Other | Attending: Internal Medicine | Admitting: Internal Medicine

## 2017-08-24 ENCOUNTER — Encounter: Payer: Self-pay | Admitting: Internal Medicine

## 2017-08-24 VITALS — BP 161/94 | HR 90 | Temp 98.7°F | Resp 16 | Wt 196.4 lb

## 2017-08-24 DIAGNOSIS — E11649 Type 2 diabetes mellitus with hypoglycemia without coma: Secondary | ICD-10-CM

## 2017-08-24 DIAGNOSIS — Z6827 Body mass index (BMI) 27.0-27.9, adult: Secondary | ICD-10-CM | POA: Diagnosis not present

## 2017-08-24 DIAGNOSIS — E669 Obesity, unspecified: Secondary | ICD-10-CM | POA: Insufficient documentation

## 2017-08-24 DIAGNOSIS — Z79899 Other long term (current) drug therapy: Secondary | ICD-10-CM | POA: Insufficient documentation

## 2017-08-24 DIAGNOSIS — Z880 Allergy status to penicillin: Secondary | ICD-10-CM | POA: Insufficient documentation

## 2017-08-24 DIAGNOSIS — E119 Type 2 diabetes mellitus without complications: Secondary | ICD-10-CM | POA: Diagnosis not present

## 2017-08-24 DIAGNOSIS — Z7951 Long term (current) use of inhaled steroids: Secondary | ICD-10-CM | POA: Diagnosis not present

## 2017-08-24 DIAGNOSIS — E7800 Pure hypercholesterolemia, unspecified: Secondary | ICD-10-CM | POA: Diagnosis not present

## 2017-08-24 DIAGNOSIS — F17210 Nicotine dependence, cigarettes, uncomplicated: Secondary | ICD-10-CM | POA: Diagnosis not present

## 2017-08-24 DIAGNOSIS — Z833 Family history of diabetes mellitus: Secondary | ICD-10-CM | POA: Diagnosis not present

## 2017-08-24 DIAGNOSIS — I10 Essential (primary) hypertension: Secondary | ICD-10-CM | POA: Insufficient documentation

## 2017-08-24 DIAGNOSIS — Z7984 Long term (current) use of oral hypoglycemic drugs: Secondary | ICD-10-CM | POA: Diagnosis not present

## 2017-08-24 DIAGNOSIS — Z825 Family history of asthma and other chronic lower respiratory diseases: Secondary | ICD-10-CM | POA: Diagnosis not present

## 2017-08-24 DIAGNOSIS — Z7982 Long term (current) use of aspirin: Secondary | ICD-10-CM | POA: Insufficient documentation

## 2017-08-24 DIAGNOSIS — F17200 Nicotine dependence, unspecified, uncomplicated: Secondary | ICD-10-CM

## 2017-08-24 DIAGNOSIS — Z8249 Family history of ischemic heart disease and other diseases of the circulatory system: Secondary | ICD-10-CM | POA: Diagnosis not present

## 2017-08-24 DIAGNOSIS — Z794 Long term (current) use of insulin: Secondary | ICD-10-CM

## 2017-08-24 LAB — POCT GLYCOSYLATED HEMOGLOBIN (HGB A1C): HEMOGLOBIN A1C: 7.1

## 2017-08-24 LAB — GLUCOSE, POCT (MANUAL RESULT ENTRY)
POC Glucose: 53 mg/dl — AB (ref 70–99)
POC Glucose: 90 mg/dl (ref 70–99)

## 2017-08-24 MED ORDER — EMBRACE LANCETS ULTRA THIN 30G MISC: 1.0000 | Freq: Two times a day (BID) | 11 refills | Status: DC

## 2017-08-24 NOTE — Progress Notes (Signed)
Patient ID: Stephanie Neal, female    DOB: Feb 22, 1956  MRN: [id number]  CC: Diabetes and Hypertension   Subjective: Stephanie Neal is a 62 y.o. female who presents for chronic ds management Her concerns today include:  Hx of DM, HTN, tob, obesity, HL,  1.  DM: BS low today at 67.  She reports that she ate a little bit of stirfry vegetables before coming out today.  Given crackers and juice here in the office Not checking BS regularly but can tell when BS low.  Denies frequent episodes Med:  Compliant with meds -Amaryl and metformin Exercise:  Likes to walk but area where she lives is unsafe  2.  HTN: compliant with Lisinopril and salt restriction No CP/SOB/LE edema  3.  TOb dep: Reports that she is still working on trying to quit.  Down to 2 cigarettes a day  Patient Active Problem List   Diagnosis Date Noted  . DM type 2 (diabetes mellitus, type 2) ([place]) 03/05/2014  . HYPERCHOLESTEROLEMIA 03/12/2007  . OBESITY 03/12/2007  . TOBACCO ABUSE 03/12/2007  . HTN (hypertension) 03/12/2007  . ALLERGIC RHINITIS, SEASONAL 03/12/2007     Current Outpatient Medications on File Prior to Visit  Medication Sig Dispense Refill  . albuterol (PROVENTIL HFA;VENTOLIN HFA) 108 (90 Base) MCG/ACT inhaler Inhale 1-2 puffs into the lungs every 6 (six) hours as needed for wheezing or shortness of breath. (Patient not taking: Reported on 05/24/2017) 1 Inhaler 0  . aspirin (ASPIRIN CHILDRENS) 81 MG chewable tablet Chew 1 tablet (81 mg total) by mouth daily. 90 tablet 3  . atorvastatin (LIPITOR) 40 MG tablet Take 1 tablet (40 mg total) by mouth daily. 90 tablet 3  . diclofenac sodium (VOLTAREN) 1 % GEL Apply 1 application topically 4 (four) times daily. 100 g 0  . fluticasone (FLONASE) 50 MCG/ACT nasal spray Place 2 sprays into both nostrils daily. 16 g 6  . glimepiride (AMARYL) 2 MG tablet Take 0.5 tablets (1 mg total) by mouth daily before breakfast. 30 tablet 3  . glucose blood test strip Check BS  once daily. Stripes for  Free style meter 100 each 12  . glucose monitoring kit (FREESTYLE) monitoring kit 1 each by Does not apply route as needed for other. 1 each 0  . lisinopril (PRINIVIL,ZESTRIL) 20 MG tablet Take 1 tablet (20 mg total) by mouth daily. 30 tablet 1  . metFORMIN (GLUCOPHAGE) 1000 MG tablet Take 1 tablet (1,000 mg total) by mouth 2 (two) times daily with a meal. For diabetes 60 tablet 5   No current facility-administered medications on file prior to visit.     Allergies  Allergen Reactions  . Penicillins Other (See Comments)    "blanked out" Has patient had a PCN reaction causing immediate rash, facial/tongue/throat swelling, SOB or lightheadedness with hypotension: No Has patient had a PCN reaction causing severe rash involving mucus membranes or skin necrosis: No Has patient had a PCN reaction that required hospitalization No Has patient had a PCN reaction occurring within the last 10 years: No If all of the above answers are "NO", then may proceed with Cephalosporin use.     Social History   Socioeconomic History  . Marital status: Single    Spouse name: Not on file  . Number of children: Not on file  . Years of education: Not on file  . Highest education level: Not on file  Occupational History  . Not on file  Social Needs  . Financial resource strain:  Not on file  . Food insecurity:    Worry: Not on file    Inability: Not on file  . Transportation needs:    Medical: Not on file    Non-medical: Not on file  Tobacco Use  . Smoking status: Current Every Day Smoker    Packs/day: 0.25    Types: Cigarettes  . Smokeless tobacco: Never Used  Substance and Sexual Activity  . Alcohol use: No  . Drug use: No  . Sexual activity: Not on file  Lifestyle  . Physical activity:    Days per week: Not on file    Minutes per session: Not on file  . Stress: Not on file  Relationships  . Social connections:    Talks on phone: Not on file    Gets together: Not  on file    Attends religious service: Not on file    Active member of club or organization: Not on file    Attends meetings of clubs or organizations: Not on file    Relationship status: Not on file  . Intimate partner violence:    Fear of current or ex partner: Not on file    Emotionally abused: Not on file    Physically abused: Not on file    Forced sexual activity: Not on file  Other Topics Concern  . Not on file  Social History Narrative  . Not on file    Family History  Problem Relation Age of Onset  . Hypertension Mother   . Diabetes Mother   . COPD Father   . Heart disease Father   . Diabetes Father     No past surgical history on file.  ROS: Review of Systems Negative except as stated above PHYSICAL EXAM: BP (!) 161/94   Pulse 90   Temp 98.7 F (37.1 C) (Oral)   Resp 16   Wt 196 lb 6.4 oz (89.1 kg)   SpO2 95%   BMI 27.39 kg/m   Wt Readings from Last 3 Encounters:  08/24/17 196 lb 6.4 oz (89.1 kg)  05/24/17 187 lb 12.8 oz (85.2 kg)  02/19/17 198 lb (89.8 kg)   Repeat BP 146/85 Physical Exam  General appearance - alert, well appearing, older AAF and in no distress Mental status - alert, oriented to person, place, and time, normal mood, behavior, speech, dress, motor activity, and thought processes Mouth - mucous membranes moist, pharynx normal without lesions Chest - clear to auscultation, no wheezes, rales or rhonchi, symmetric air entry Heart - normal rate, regular rhythm, normal S1, S2, no murmurs, rubs, clicks or gallops Extremities - peripheral pulses normal, no pedal edema, no clubbing or cyanosis Diabetic Foot Exam - Simple   Simple Foot Form Visual Inspection See comments:  Yes Sensation Testing Intact to touch and monofilament testing bilaterally:  Yes Pulse Check Posterior Tibialis and Dorsalis pulse intact bilaterally:  Yes Comments Bunions on both big toes.  Not inflammed.        Chemistry      Component Value Date/Time   NA 142  05/24/2017 1046   K 4.4 05/24/2017 1046   CL 102 05/24/2017 1046   CO2 26 05/24/2017 1046   BUN 9 05/24/2017 1046   CREATININE 0.94 05/24/2017 1046   CREATININE 1.11 (H) 04/04/2016 1113      Component Value Date/Time   CALCIUM 9.6 05/24/2017 1046   ALKPHOS 61 05/24/2017 1046   AST 13 05/24/2017 1046   ALT 8 05/24/2017 1046   BILITOT 0.2  05/24/2017 1046     Lab Results  Component Value Date   WBC 4.8 05/24/2017   HGB 13.6 05/24/2017   HCT 39.3 05/24/2017   MCV 79 05/24/2017   PLT 221 05/24/2017    A1C7.1/BS 53  ASSESSMENT AND PLAN: 1. Type 2 diabetes mellitus without complication, with long-term current use of insulin ([facility]) -A1C acceptable.  Continue current meds Check BS a few times a wk - POCT glycosylated hemoglobin (Hb A1C) - POCT glucose (manual entry) - EMBRACE LANCETS ULTRA THIN 72U MISC; 1 applicator by Does not apply route 2 (two) times daily.  Dispense: 100 each; Refill: 11 - POCT glucose (manual entry)  2. Essential hypertension Not at goal. Continue Lisinopril. Nurse only BP check in 2 wks.  If still >130/80, will add Norvasc 5 mg  3. TOBACCO ABUSE -continue to encourage her to set quit date  4. Hypoglycemia associated with diabetes ([place]) -BS >100 prior to dischg from office today.  Went over symptoms of hypoglycemia and how to treat.  Encourage pt to always keep a small snack or glucose tabs in her purse.  Call if hypoglycemia occurs more often   Patient was given the opportunity to ask questions.  Patient verbalized understanding of the plan and was able to repeat key elements of the plan.   Orders Placed This Encounter  Procedures  . POCT glycosylated hemoglobin (Hb A1C)  . POCT glucose (manual entry)  . POCT glucose (manual entry)     Requested Prescriptions   Signed Prescriptions Disp Refills  . EMBRACE LANCETS ULTRA THIN 30G MISC 100 each 11    Sig: 1 applicator by Does not apply route 2 (two) times daily.    Return in about 3 months  (around 11/23/2017).  Karle Plumber, MD, FACP

## 2017-08-24 NOTE — Patient Instructions (Addendum)
Please give nurse only blood pressure check in 2 weeks.  Please call and schedule your eye appointment.   Consider walking a the mall or [employer].

## 2017-08-25 ENCOUNTER — Ambulatory Visit (HOSPITAL_COMMUNITY)
Admission: EM | Admit: 2017-08-25 | Discharge: 2017-08-25 | Disposition: A | Discharge status: Home / Self Care | Payer: Medicaid Other | Attending: Family Medicine | Admitting: Family Medicine

## 2017-08-25 ENCOUNTER — Encounter (HOSPITAL_COMMUNITY): Payer: Self-pay | Admitting: Emergency Medicine

## 2017-08-25 DIAGNOSIS — I10 Essential (primary) hypertension: Secondary | ICD-10-CM | POA: Diagnosis not present

## 2017-08-25 NOTE — ED Triage Notes (Signed)
Pt c/o HTN, states "ive been taking my medication like im supposed to but im not sure why its so high"

## 2017-08-25 NOTE — Discharge Instructions (Addendum)
Your blood pressure today was 149/82. Keep your follow up appointment with your primary doctor.

## 2017-08-25 NOTE — ED Provider Notes (Signed)
[facility]   [id number] 08/25/17 Arrival Time: 1305  ASSESSMENT & PLAN:  1. Essential hypertension    Reports her PCP increased her BP medicine yesterday. She has not picked this up yet but plans to and start today.  Has f/u with PCP on May 10. BP looks better today. Reassured.  Reviewed expectations re: course of current medical issues. Questions answered. Outlined signs and symptoms indicating need for more acute intervention. Patient verbalized understanding. After Visit Summary given.   SUBJECTIVE:  Stephanie Neal is a 62 y.o. female who presents with concerns regarding increased blood pressures. She reports that she has been treated for hypertension in the past.  She reports no TIA's, no chest pain on exertion, no dyspnea on exertion and no swelling of ankles. Desires BP recheck here today.  Social History   Tobacco Use  Smoking Status Current Every Day Smoker  . Packs/day: 0.25  . Types: Cigarettes  Smokeless Tobacco Never Used    ROS: As per HPI.   OBJECTIVE:  Vitals:   08/25/17 1346  BP: (!) 149/82  Pulse: 70  Resp: 18  Temp: 97.9 F (36.6 C)  SpO2: 100%    General appearance: alert; no distress Eyes: PERRLA; EOMI HENT: normocephalic; atraumatic Neck: supple Lungs: clear to auscultation bilaterally Heart: regular rate and rhythm without murmer Extremities: no cyanosis or edema; symmetrical with no gross deformities Skin: warm and dry Psychological: alert and cooperative; normal mood and affect   Allergies  Allergen Reactions  . Penicillins Other (See Comments)    "blanked out" Has patient had a PCN reaction causing immediate rash, facial/tongue/throat swelling, SOB or lightheadedness with hypotension: No Has patient had a PCN reaction causing severe rash involving mucus membranes or skin necrosis: No Has patient had a PCN reaction that required hospitalization No Has patient had a PCN reaction occurring within the last 10 years:  No If all of the above answers are "NO", then may proceed with Cephalosporin use.     Past Medical History:  Diagnosis Date  . Diabetes mellitus   . Hyperlipidemia   . Hypertension    Social History   Socioeconomic History  . Marital status: Single    Spouse name: Not on file  . Number of children: Not on file  . Years of education: Not on file  . Highest education level: Not on file  Occupational History  . Not on file  Social Needs  . Financial resource strain: Not on file  . Food insecurity:    Worry: Not on file    Inability: Not on file  . Transportation needs:    Medical: Not on file    Non-medical: Not on file  Tobacco Use  . Smoking status: Current Every Day Smoker    Packs/day: 0.25    Types: Cigarettes  . Smokeless tobacco: Never Used  Substance and Sexual Activity  . Alcohol use: No  . Drug use: No  . Sexual activity: Not on file  Lifestyle  . Physical activity:    Days per week: Not on file    Minutes per session: Not on file  . Stress: Not on file  Relationships  . Social connections:    Talks on phone: Not on file    Gets together: Not on file    Attends religious service: Not on file    Active member of club or organization: Not on file    Attends meetings of clubs or organizations: Not on file    Relationship  status: Not on file  . Intimate partner violence:    Fear of current or ex partner: Not on file    Emotionally abused: Not on file    Physically abused: Not on file    Forced sexual activity: Not on file  Other Topics Concern  . Not on file  Social History Narrative  . Not on file   Family History  Problem Relation Age of Onset  . Hypertension Mother   . Diabetes Mother   . COPD Father   . Heart disease Father   . Diabetes Father    History reviewed. No pertinent surgical history.   Mardella Layman, MD 09/03/17 [phone]

## 2017-09-07 ENCOUNTER — Encounter: Payer: Medicaid Other | Admitting: Pharmacist

## 2017-09-12 ENCOUNTER — Ambulatory Visit: Payer: Medicaid Other | Attending: Internal Medicine | Admitting: Pharmacist

## 2017-09-12 ENCOUNTER — Encounter: Payer: Self-pay | Admitting: Pharmacist

## 2017-09-12 VITALS — BP 153/87 | HR 73

## 2017-09-12 DIAGNOSIS — Z79899 Other long term (current) drug therapy: Secondary | ICD-10-CM | POA: Insufficient documentation

## 2017-09-12 DIAGNOSIS — I10 Essential (primary) hypertension: Secondary | ICD-10-CM

## 2017-09-12 MED ORDER — AMLODIPINE BESYLATE 10 MG PO TABS: 10.0000 mg | ORAL_TABLET | Freq: Every day | ORAL | 3 refills | Status: DC

## 2017-09-12 MED ORDER — AMLODIPINE BESYLATE 5 MG PO TABS: 5.0000 mg | ORAL_TABLET | Freq: Every day | ORAL | 3 refills | Status: DC

## 2017-09-12 NOTE — Progress Notes (Signed)
   S:    Patient arrives in good spirits. Presents to the clinic for blood pressure re-check at the request of Dr. Laural Benes. Patient was referred on 08/24/17.   Patient reports adherence with medications.  Current BP Medications include:  Lisinopril    Antihypertensives tried in the past include: lisinopril-HCTZ  Dietary habits include: Pt states that she has cut-back on salt.  States that she uses Mrs. Dash.  Pt states usually drinks 1 cup of coffee a day.  O:   Last 3 Office BP readings: BP from today's visit: 153/87  BP Readings from Last 3 Encounters:  08/25/17 (!) 149/82  08/24/17 (!) 161/94  05/24/17 134/88    BMET    Component Value Date/Time   NA 142 05/24/2017 1046   K 4.4 05/24/2017 1046   CL 102 05/24/2017 1046   CO2 26 05/24/2017 1046   GLUCOSE 128 (H) 05/24/2017 1046   GLUCOSE 92 08/21/2016 1506   BUN 9 05/24/2017 1046   CREATININE 0.94 05/24/2017 1046   CREATININE 1.11 (H) 04/04/2016 1113   CALCIUM 9.6 05/24/2017 1046   GFRNONAA 65 05/24/2017 1046   GFRNONAA 54 (L) 04/04/2016 1113   GFRAA 75 05/24/2017 1046   GFRAA 62 04/04/2016 1113    A/P: Hypertension longstanding currently uncontrolled on current medications.    Per Dr. Laural Benes, pt to start amlodipine  if BP >130/80. Therefore, will have patient start amlodipine  daily. Patient is amenable to the plan. I have counseled her on side effects/benefits. She knows to contact us with any questions or concerns.   Results reviewed and written information provided.   Total time in face-to-face counseling 15 minutes.   F/U Clinic Visit with Dr. Laural Benes on 11/20/17.   Patient seen with Carolin Sicks, PharmD Candidate [employer]  Cloyde Reams, PharmD Clinical Pharmacist  [facility] [phone]

## 2017-09-12 NOTE — Patient Instructions (Addendum)
Thank you for coming to see me today! Please start amlodipine  daily. Also, continue the lifestyle changes that we talked about today. Contact us if you have any questions or concerns. Your next follow-up is 11/20/17 with Dr. Laural Benes.

## 2017-09-12 NOTE — Addendum Note (Signed)
Addended by: Lois Huxley, Marquesha Robideau L on: 09/12/2017 11:08 AM   Modules accepted: Orders

## 2017-09-18 ENCOUNTER — Other Ambulatory Visit: Payer: Self-pay | Admitting: *Deleted

## 2017-09-18 MED ORDER — LISINOPRIL 20 MG PO TABS: 20.0000 mg | ORAL_TABLET | Freq: Every day | ORAL | 3 refills | Status: DC

## 2017-09-18 NOTE — Telephone Encounter (Signed)
Patient verified DOB Patient requested lisinopril to be refilled. Last filled in January. Patient was seen in clinic 08/24/17. Patient returned for BP recheck on 09/12/17. Patient was advised to take lisinopril and added amlodipine due to BP still not being at goal. [facility] to [facility]. No further questions.

## 2017-11-13 ENCOUNTER — Ambulatory Visit: Payer: Medicaid Other | Attending: Internal Medicine | Admitting: Internal Medicine

## 2017-11-13 ENCOUNTER — Encounter: Payer: Self-pay | Admitting: Internal Medicine

## 2017-11-13 VITALS — BP 128/82 | HR 98 | Temp 98.1°F | Resp 16 | Wt 196.4 lb

## 2017-11-13 DIAGNOSIS — I10 Essential (primary) hypertension: Secondary | ICD-10-CM | POA: Diagnosis not present

## 2017-11-13 DIAGNOSIS — Z79899 Other long term (current) drug therapy: Secondary | ICD-10-CM | POA: Diagnosis not present

## 2017-11-13 DIAGNOSIS — Z638 Other specified problems related to primary support group: Secondary | ICD-10-CM | POA: Insufficient documentation

## 2017-11-13 DIAGNOSIS — Z880 Allergy status to penicillin: Secondary | ICD-10-CM | POA: Insufficient documentation

## 2017-11-13 DIAGNOSIS — Z6827 Body mass index (BMI) 27.0-27.9, adult: Secondary | ICD-10-CM | POA: Insufficient documentation

## 2017-11-13 DIAGNOSIS — F17210 Nicotine dependence, cigarettes, uncomplicated: Secondary | ICD-10-CM | POA: Diagnosis not present

## 2017-11-13 DIAGNOSIS — E119 Type 2 diabetes mellitus without complications: Secondary | ICD-10-CM | POA: Diagnosis present

## 2017-11-13 DIAGNOSIS — Z7984 Long term (current) use of oral hypoglycemic drugs: Secondary | ICD-10-CM | POA: Diagnosis not present

## 2017-11-13 DIAGNOSIS — E7800 Pure hypercholesterolemia, unspecified: Secondary | ICD-10-CM | POA: Diagnosis not present

## 2017-11-13 DIAGNOSIS — Z7982 Long term (current) use of aspirin: Secondary | ICD-10-CM | POA: Diagnosis not present

## 2017-11-13 DIAGNOSIS — E669 Obesity, unspecified: Secondary | ICD-10-CM | POA: Diagnosis not present

## 2017-11-13 LAB — GLUCOSE, POCT (MANUAL RESULT ENTRY): POC Glucose: 200 mg/dl — AB (ref 70–99)

## 2017-11-13 NOTE — Progress Notes (Signed)
Patient ID: Stephanie Neal, female    DOB: June 23, 1955  MRN: [id number]  CC: Diabetes and Hypertension   Subjective: Stephanie Neal is a 62 y.o. female who presents for chronic ds management.  Daughter is with her. Her concerns today include:  Hx of DM, HTN, tob, obesity, HL,  HTN: Since last visit she has seen the clinical pharmacist.  Low-dose Norvasc was added to lisinopril.  She reports compliance with medications.   No CP/SOB Not checking BP  DM:  Not checking BS but does have a device Reports compliance with metformin and Amaryl.  A1c on last visit was 7.1. She is doing okay with eating habits.  Complains of a lot of sneezing and coughing green phlegm in mornings x 1 wk.  Some itchy eyes, no itcy throat. .  No wheezing or SOB  She is dealing with a lot of family stress.  She has grown children who come to her house unannounce bringing their children with them.  She reports that the children are unruly and disrespectful.  When she tries not to answer her phone or her door, her daughter will call repeatedly.  This irritates and stresses her out.  She is thinking about moving in with her cousin to get away from her grown children and their kids.  Patient Active Problem List   Diagnosis Date Noted  . DM type 2 (diabetes mellitus, type 2) ([place]) 03/05/2014  . HYPERCHOLESTEROLEMIA 03/12/2007  . OBESITY 03/12/2007  . TOBACCO ABUSE 03/12/2007  . HTN (hypertension) 03/12/2007  . ALLERGIC RHINITIS, SEASONAL 03/12/2007     Current Outpatient Medications on File Prior to Visit  Medication Sig Dispense Refill  . albuterol (PROVENTIL HFA;VENTOLIN HFA) 108 (90 Base) MCG/ACT inhaler Inhale 1-2 puffs into the lungs every 6 (six) hours as needed for wheezing or shortness of breath. (Patient not taking: Reported on 05/24/2017) 1 Inhaler 0  . amLODipine (NORVASC) 5 MG tablet Take 1 tablet (5 mg total) by mouth daily. 30 tablet 3  . aspirin (ASPIRIN CHILDRENS) 81 MG chewable tablet Chew 1 tablet  (81 mg total) by mouth daily. 90 tablet 3  . atorvastatin (LIPITOR) 40 MG tablet Take 1 tablet (40 mg total) by mouth daily. 90 tablet 3  . diclofenac sodium (VOLTAREN) 1 % GEL Apply 1 application topically 4 (four) times daily. 100 g 0  . EMBRACE LANCETS ULTRA THIN 89V MISC 1 applicator by Does not apply route 2 (two) times daily. 100 each 11  . fluticasone (FLONASE) 50 MCG/ACT nasal spray Place 2 sprays into both nostrils daily. 16 g 6  . glimepiride (AMARYL) 2 MG tablet Take 0.5 tablets (1 mg total) by mouth daily before breakfast. 30 tablet 3  . glucose blood test strip Check BS once daily. Stripes for  Free style meter 100 each 12  . glucose monitoring kit (FREESTYLE) monitoring kit 1 each by Does not apply route as needed for other. 1 each 0  . lisinopril (PRINIVIL,ZESTRIL) 20 MG tablet Take 1 tablet (20 mg total) by mouth daily. 30 tablet 3  . metFORMIN (GLUCOPHAGE) 1000 MG tablet Take 1 tablet (1,000 mg total) by mouth 2 (two) times daily with a meal. For diabetes 60 tablet 5   No current facility-administered medications on file prior to visit.     Allergies  Allergen Reactions  . Penicillins Other (See Comments)    "blanked out" Has patient had a PCN reaction causing immediate rash, facial/tongue/throat swelling, SOB or lightheadedness with hypotension: No Has patient  had a PCN reaction causing severe rash involving mucus membranes or skin necrosis: No Has patient had a PCN reaction that required hospitalization No Has patient had a PCN reaction occurring within the last 10 years: No If all of the above answers are "NO", then may proceed with Cephalosporin use.     Social History   Socioeconomic History  . Marital status: Single    Spouse name: Not on file  . Number of children: Not on file  . Years of education: Not on file  . Highest education level: Not on file  Occupational History  . Not on file  Social Needs  . Financial resource strain: Not on file  . Food  insecurity:    Worry: Not on file    Inability: Not on file  . Transportation needs:    Medical: Not on file    Non-medical: Not on file  Tobacco Use  . Smoking status: Current Every Day Smoker    Packs/day: 0.25    Types: Cigarettes  . Smokeless tobacco: Never Used  Substance and Sexual Activity  . Alcohol use: No  . Drug use: No  . Sexual activity: Not on file  Lifestyle  . Physical activity:    Days per week: Not on file    Minutes per session: Not on file  . Stress: Not on file  Relationships  . Social connections:    Talks on phone: Not on file    Gets together: Not on file    Attends religious service: Not on file    Active member of club or organization: Not on file    Attends meetings of clubs or organizations: Not on file    Relationship status: Not on file  . Intimate partner violence:    Fear of current or ex partner: Not on file    Emotionally abused: Not on file    Physically abused: Not on file    Forced sexual activity: Not on file  Other Topics Concern  . Not on file  Social History Narrative  . Not on file    Family History  Problem Relation Age of Onset  . Hypertension Mother   . Diabetes Mother   . COPD Father   . Heart disease Father   . Diabetes Father     No past surgical history on file.  ROS: Review of Systems Negative except as stated above  PHYSICAL EXAM: BP (!) 156/84   Pulse 98   Temp 98.1 F (36.7 C) (Oral)   Resp 16   Wt 196 lb 6.4 oz (89.1 kg)   SpO2 96%   BMI 27.39 kg/m   Wt Readings from Last 3 Encounters:  11/13/17 196 lb 6.4 oz (89.1 kg)  08/24/17 196 lb 6.4 oz (89.1 kg)  05/24/17 187 lb 12.8 oz (85.2 kg)   BP 128/82 Physical Exam  General appearance - alert, well appearing, and in no distress Mental status - normal mood, behavior, speech, dress, motor activity, and thought processes Neck - supple, no significant adenopathy Chest - clear to auscultation, no wheezes, rales or rhonchi, symmetric air  entry Heart - normal rate, regular rhythm, normal S1, S2, no murmurs, rubs, clicks or gallops Extremities - peripheral pulses normal, no pedal edema, no clubbing or cyanosis  BS 200  Results for orders placed or performed in visit on 11/13/17  POCT glucose (manual entry)  Result Value Ref Range   POC Glucose 200 (A) 70 - 99 mg/dl    ASSESSMENT  AND PLAN: 1. Controlled type 2 diabetes mellitus without complication, without long-term current use of insulin (HCC) Continue metformin and Amaryl.  Continue to encourage healthy eating habits. - POCT glucose (manual entry) - Hemoglobin A1c  2. Essential hypertension Blood pressure close to goal.  Continue lisinopril and amlodipine.  3. Stress due to family tension -I wanted our LCSW to meet with her but she was with another patient today.  I will have her follow-up with this patient.  Patient was given the opportunity to ask questions.  Patient verbalized understanding of the plan and was able to repeat key elements of the plan.   Orders Placed This Encounter  Procedures  . Hemoglobin A1c  . POCT glucose (manual entry)     Requested Prescriptions    No prescriptions requested or ordered in this encounter    Return in about 4 months (around 03/16/2018).  Karle Plumber, MD, FACP

## 2017-11-14 ENCOUNTER — Telehealth: Payer: Self-pay

## 2017-11-14 ENCOUNTER — Other Ambulatory Visit: Payer: Self-pay | Admitting: Internal Medicine

## 2017-11-14 LAB — HEMOGLOBIN A1C
Est. average glucose Bld gHb Est-mCnc: 171 mg/dL
HEMOGLOBIN A1C: 7.6 % — AB (ref 4.8–5.6)

## 2017-11-14 MED ORDER — GLIMEPIRIDE 2 MG PO TABS: 2.0000 mg | ORAL_TABLET | Freq: Every day | ORAL | 6 refills | Status: DC

## 2017-11-14 NOTE — Telephone Encounter (Signed)
Contacted pt to go over lab results tried the number twice and message says the number you are tyring to dail can not be reached.. Will try and contact again later  If pt calls back for results please give them: A1C was 7.6; increased from 7.1 on last visit. Goal is less than 7. Recommend increase the Glimepiride from 2 mg g1/2 tab daily to a full tab daily. Updated rxn sent to her pharmacy.

## 2017-11-16 ENCOUNTER — Telehealth: Payer: Self-pay | Admitting: Licensed Clinical Social Worker

## 2017-11-16 NOTE — Telephone Encounter (Signed)
LCSWA attempted to contact pt to follow up on behavioral health screens from prior appointment. There was no answer or option to leave a message.

## 2017-11-20 ENCOUNTER — Ambulatory Visit: Payer: Medicaid Other | Admitting: Internal Medicine

## 2017-11-20 ENCOUNTER — Telehealth: Payer: Self-pay

## 2017-11-20 NOTE — Telephone Encounter (Signed)
Call placed to the patient regarding a question she had about the new managed medicaid contracts. She received a letter from [facility] instructing her to choose a PCP and health plan.  She was unsure of a health plan  and wanted to confirm what plan (s) Dr Laural BenesJohnson would participate with .  Informed her that [facility] not [facility] which [facility] providers would participate with.  The patient then stated that she picked [facility] and submitted her paperwork  because she didn't want to wait even though she knows she had until 01/11/18 to submit her choices. She also stated that she can change her health plan if she would like  - until 05/31/2018

## 2017-11-21 ENCOUNTER — Telehealth: Payer: Self-pay | Admitting: Licensed Clinical Social Worker

## 2017-11-21 NOTE — Telephone Encounter (Signed)
LCSWA attempted to contact pt to follow up on behavioral health screens from prior appointment. There was no answer or option to leave a message.  

## 2018-01-07 ENCOUNTER — Other Ambulatory Visit: Payer: Self-pay

## 2018-01-07 MED ORDER — AMLODIPINE BESYLATE 5 MG PO TABS: 5.0000 mg | ORAL_TABLET | Freq: Every day | ORAL | 2 refills | Status: DC

## 2018-01-09 ENCOUNTER — Telehealth: Payer: Self-pay | Admitting: Internal Medicine

## 2018-01-09 DIAGNOSIS — I10 Essential (primary) hypertension: Secondary | ICD-10-CM

## 2018-01-09 MED ORDER — LISINOPRIL 20 MG PO TABS: 20.0000 mg | ORAL_TABLET | Freq: Every day | ORAL | 2 refills | Status: DC

## 2018-01-09 NOTE — Telephone Encounter (Signed)
1) Medication(s) Requested (by name): lisinopril (PRINIVIL,ZESTRIL) 20 MG tablet [[id number]]  2) [id number] [place], [place] - [address]  3) Special Requests:   Approved medications will be sent to the pharmacy, we will reach out if there is an issue.  Requests made after 3pm may not be addressed until the following business day!  If a patient is unsure of the name of the medication(s) please note and ask patient to call back when they are able to provide all info, do not send to responsible party until all information is available!

## 2018-03-18 ENCOUNTER — Ambulatory Visit: Payer: Medicaid Other | Attending: Internal Medicine | Admitting: Internal Medicine

## 2018-03-18 ENCOUNTER — Encounter: Payer: Self-pay | Admitting: Internal Medicine

## 2018-03-18 VITALS — BP 130/82 | HR 93 | Temp 97.9°F | Resp 16 | Wt 199.0 lb

## 2018-03-18 DIAGNOSIS — I10 Essential (primary) hypertension: Secondary | ICD-10-CM

## 2018-03-18 DIAGNOSIS — Z825 Family history of asthma and other chronic lower respiratory diseases: Secondary | ICD-10-CM | POA: Insufficient documentation

## 2018-03-18 DIAGNOSIS — Z79899 Other long term (current) drug therapy: Secondary | ICD-10-CM | POA: Diagnosis not present

## 2018-03-18 DIAGNOSIS — Z833 Family history of diabetes mellitus: Secondary | ICD-10-CM | POA: Diagnosis not present

## 2018-03-18 DIAGNOSIS — E785 Hyperlipidemia, unspecified: Secondary | ICD-10-CM | POA: Insufficient documentation

## 2018-03-18 DIAGNOSIS — F17210 Nicotine dependence, cigarettes, uncomplicated: Secondary | ICD-10-CM | POA: Diagnosis not present

## 2018-03-18 DIAGNOSIS — Z23 Encounter for immunization: Secondary | ICD-10-CM | POA: Diagnosis not present

## 2018-03-18 DIAGNOSIS — F17200 Nicotine dependence, unspecified, uncomplicated: Secondary | ICD-10-CM | POA: Diagnosis not present

## 2018-03-18 DIAGNOSIS — Z7984 Long term (current) use of oral hypoglycemic drugs: Secondary | ICD-10-CM | POA: Diagnosis not present

## 2018-03-18 DIAGNOSIS — E119 Type 2 diabetes mellitus without complications: Secondary | ICD-10-CM | POA: Insufficient documentation

## 2018-03-18 DIAGNOSIS — Z880 Allergy status to penicillin: Secondary | ICD-10-CM | POA: Insufficient documentation

## 2018-03-18 DIAGNOSIS — Z7951 Long term (current) use of inhaled steroids: Secondary | ICD-10-CM | POA: Diagnosis not present

## 2018-03-18 DIAGNOSIS — IMO0001 Reserved for inherently not codable concepts without codable children: Secondary | ICD-10-CM

## 2018-03-18 DIAGNOSIS — Z8249 Family history of ischemic heart disease and other diseases of the circulatory system: Secondary | ICD-10-CM | POA: Diagnosis not present

## 2018-03-18 DIAGNOSIS — Z638 Other specified problems related to primary support group: Secondary | ICD-10-CM | POA: Diagnosis not present

## 2018-03-18 DIAGNOSIS — E1165 Type 2 diabetes mellitus with hyperglycemia: Secondary | ICD-10-CM

## 2018-03-18 DIAGNOSIS — E669 Obesity, unspecified: Secondary | ICD-10-CM | POA: Diagnosis not present

## 2018-03-18 LAB — POCT GLYCOSYLATED HEMOGLOBIN (HGB A1C): HBA1C, POC (CONTROLLED DIABETIC RANGE): 7.7 % — AB (ref 0.0–7.0)

## 2018-03-18 LAB — GLUCOSE, POCT (MANUAL RESULT ENTRY): POC Glucose: 133 mg/dl — AB (ref 70–99)

## 2018-03-18 MED ORDER — AMLODIPINE BESYLATE 10 MG PO TABS: 10.0000 mg | ORAL_TABLET | Freq: Every day | ORAL | 6 refills | Status: DC

## 2018-03-18 MED ORDER — ASPIRIN EC 81 MG PO TBEC: 81.0000 mg | DELAYED_RELEASE_TABLET | Freq: Every day | ORAL | 1 refills | Status: DC

## 2018-03-18 NOTE — Progress Notes (Signed)
Patient ID: Stephanie Neal, female    DOB: Mar 18, 1956  MRN: [id number]  CC: Diabetes and Hypertension   Subjective: Stephanie Neal is a 62 y.o. female who presents for chronic ds management. Her concerns today include:  Hx of DM, HTN, tob, obesity, HL,  Since last visit, our LCSW has tried to reach her via phone x 2 unsuccessfully.  Pt states her phone acts up sometimes.  She reports that her oldest daughter continues to stress her out.  She thinks her daughter is on drugs.  DM:  Not checking BS but she has glucometer.  A1C is 7.7 up from 7.1 "I try to eat right.  I stay away from the salt and the sugar."  Drinks mainly water and coffee with Sweet and Low and light creamer. Reports compliance with metformin.  She is taking Amaryl 2 mg half of a tablet daily Use to walk around her apartment complex but she does not any more because things are not safe.  She found a park not far way where she plans to go to walk No blurred vision. Due for eye exam  HTN:  Reports compliance with Lisinopril and Norvasc.  Has a wrist device but she does not use it No CP/SOB/LE edema/HA  Tob dep:  "I'm smoking less.  I know I can do it on my own."  Saw her 12 yr old grandson yesterday.  He encouraged her to quit.  Pt felt this helped to give her strength to continue to try to quit.     Patient Active Problem List   Diagnosis Date Noted  . Stress due to family tension 11/13/2017  . DM type 2 (diabetes mellitus, type 2) ([place]) 03/05/2014  . HYPERCHOLESTEROLEMIA 03/12/2007  . OBESITY 03/12/2007  . TOBACCO ABUSE 03/12/2007  . HTN (hypertension) 03/12/2007  . ALLERGIC RHINITIS, SEASONAL 03/12/2007     Current Outpatient Medications on File Prior to Visit  Medication Sig Dispense Refill  . albuterol (PROVENTIL HFA;VENTOLIN HFA) 108 (90 Base) MCG/ACT inhaler Inhale 1-2 puffs into the lungs every 6 (six) hours as needed for wheezing or shortness of breath. (Patient not taking: Reported on 05/24/2017) 1  Inhaler 0  . atorvastatin (LIPITOR) 40 MG tablet Take 1 tablet (40 mg total) by mouth daily. 90 tablet 3  . diclofenac sodium (VOLTAREN) 1 % GEL Apply 1 application topically 4 (four) times daily. 100 g 0  . EMBRACE LANCETS ULTRA THIN 86V MISC 1 applicator by Does not apply route 2 (two) times daily. 100 each 11  . fluticasone (FLONASE) 50 MCG/ACT nasal spray Place 2 sprays into both nostrils daily. 16 g 6  . glimepiride (AMARYL) 2 MG tablet Take 1 tablet (2 mg total) by mouth daily before breakfast. 30 tablet 6  . glucose blood test strip Check BS once daily. Stripes for  Free style meter 100 each 12  . glucose monitoring kit (FREESTYLE) monitoring kit 1 each by Does not apply route as needed for other. 1 each 0  . lisinopril (PRINIVIL,ZESTRIL) 20 MG tablet Take 1 tablet (20 mg total) by mouth daily. 30 tablet 2  . metFORMIN (GLUCOPHAGE) 1000 MG tablet Take 1 tablet (1,000 mg total) by mouth 2 (two) times daily with a meal. For diabetes 60 tablet 5   No current facility-administered medications on file prior to visit.     Allergies  Allergen Reactions  . Penicillins Other (See Comments)    "blanked out" Has patient had a PCN reaction causing immediate rash, facial/tongue/throat  swelling, SOB or lightheadedness with hypotension: No Has patient had a PCN reaction causing severe rash involving mucus membranes or skin necrosis: No Has patient had a PCN reaction that required hospitalization No Has patient had a PCN reaction occurring within the last 10 years: No If all of the above answers are "NO", then may proceed with Cephalosporin use.     Social History   Socioeconomic History  . Marital status: Single    Spouse name: Not on file  . Number of children: Not on file  . Years of education: Not on file  . Highest education level: Not on file  Occupational History  . Not on file  Social Needs  . Financial resource strain: Not on file  . Food insecurity:    Worry: Not on file     Inability: Not on file  . Transportation needs:    Medical: Not on file    Non-medical: Not on file  Tobacco Use  . Smoking status: Current Every Day Smoker    Packs/day: 0.25    Types: Cigarettes  . Smokeless tobacco: Never Used  Substance and Sexual Activity  . Alcohol use: No  . Drug use: No  . Sexual activity: Not on file  Lifestyle  . Physical activity:    Days per week: Not on file    Minutes per session: Not on file  . Stress: Not on file  Relationships  . Social connections:    Talks on phone: Not on file    Gets together: Not on file    Attends religious service: Not on file    Active member of club or organization: Not on file    Attends meetings of clubs or organizations: Not on file    Relationship status: Not on file  . Intimate partner violence:    Fear of current or ex partner: Not on file    Emotionally abused: Not on file    Physically abused: Not on file    Forced sexual activity: Not on file  Other Topics Concern  . Not on file  Social History Narrative  . Not on file    Family History  Problem Relation Age of Onset  . Hypertension Mother   . Diabetes Mother   . COPD Father   . Heart disease Father   . Diabetes Father     No past surgical history on file.  ROS: Review of Systems Negative except as above.  PHYSICAL EXAM: BP 130/82   Pulse 93   Temp 97.9 F (36.6 C) (Oral)   Resp 16   Wt 199 lb (90.3 kg)   SpO2 95%   BMI 27.75 kg/m   Wt Readings from Last 3 Encounters:  03/18/18 199 lb (90.3 kg)  11/13/17 196 lb 6.4 oz (89.1 kg)  08/24/17 196 lb 6.4 oz (89.1 kg)   Physical Exam  General appearance - alert, well appearing, older African-American female and in no distress Mental status - normal mood, behavior, speech, dress, motor activity, and thought processes Neck - supple, no significant adenopathy Chest - clear to auscultation, no wheezes, rales or rhonchi, symmetric air entry Heart - normal rate, regular rhythm, normal S1,  S2, no murmurs, rubs, clicks or gallops Extremities - peripheral pulses normal, no pedal edema, no clubbing or cyanosis Diabetic Foot Exam - Simple   Simple Foot Form Visual Inspection See comments:  Yes Sensation Testing Intact to touch and monofilament testing bilaterally:  Yes Pulse Check Posterior Tibialis and Dorsalis pulse  intact bilaterally:  Yes Comments Noninflamed bunions on both feet.  Toenails are thick.     Results for orders placed or performed in visit on 03/18/18  POCT glucose (manual entry)  Result Value Ref Range   POC Glucose 133 (A) 70 - 99 mg/dl  POCT glycosylated hemoglobin (Hb A1C)  Result Value Ref Range   Hemoglobin A1C     HbA1c POC (<> result, manual entry)     HbA1c, POC (prediabetic range)     HbA1c, POC (controlled diabetic range) 7.7 (A) 0.0 - 7.0 %    ASSESSMENT AND PLAN:  1. Diabetes mellitus type 2, uncontrolled, without complications (HCC) F8H has increased compared to last visit.  I recommend taking Amaryl 2 mg the full tablet daily.  Continue metformin.  Continue healthy eating habits.  She plans to start walking at a park that she feels would be safer - POCT glucose (manual entry) - POCT glycosylated hemoglobin (Hb A1C) - Ambulatory referral to Ophthalmology  2. Essential hypertension Not at goal.  Increase amlodipine to 10 mg daily. - amLODipine (NORVASC) 10 MG tablet; Take 1 tablet (10 mg total) by mouth daily.  Dispense: 30 tablet; Refill: 6  3. Tobacco dependence Advised to quit.  Commended her on her efforts to quit.  Encouraged her to set a quit date.  Less than 5 minutes spent on counseling.  4. Stress due to family tension   5. Need for immunization against influenza - Flu Vaccine QUAD 36+ mos IM    Patient was given the opportunity to ask questions.  Patient verbalized understanding of the plan and was able to repeat key elements of the plan.   Orders Placed This Encounter  Procedures  . Flu Vaccine QUAD 36+ mos IM    . Ambulatory referral to Ophthalmology  . POCT glucose (manual entry)  . POCT glycosylated hemoglobin (Hb A1C)     Requested Prescriptions   Signed Prescriptions Disp Refills  . amLODipine (NORVASC) 10 MG tablet 30 tablet 6    Sig: Take 1 tablet (10 mg total) by mouth daily.  Marland Kitchen aspirin EC 81 MG tablet 90 tablet 1    Sig: Take 1 tablet (81 mg total) by mouth daily.    Return in about 3 months (around 06/18/2018).  Karle Plumber, MD, FACP

## 2018-03-18 NOTE — Patient Instructions (Addendum)
Increase Glimepiride to a full tablet daily.  This is your diabetes pill.  Your blood pressure is not controlled.  The goal is to be 130/80 or lower.  I recommend increasing amlodipine from 5 mg daily to 10 mg daily.  Influenza Virus Vaccine injection (Fluarix) What is this medicine? INFLUENZA VIRUS VACCINE (in floo EN zuh VAHY ruhs vak SEEN) helps to reduce the risk of getting influenza also known as the flu. This medicine may be used for other purposes; ask your health care provider or pharmacist if you have questions. COMMON BRAND NAME(S): Fluarix, Fluzone What should I tell my health care provider before I take this medicine? They need to know if you have any of these conditions: -bleeding disorder like hemophilia -fever or infection -Guillain-Barre syndrome or other neurological problems -immune system problems -infection with the human immunodeficiency virus (HIV) or AIDS -low blood platelet counts -multiple sclerosis -an unusual or allergic reaction to influenza virus vaccine, eggs, chicken proteins, latex, gentamicin, other medicines, foods, dyes or preservatives -pregnant or trying to get pregnant -breast-feeding How should I use this medicine? This vaccine is for injection into a muscle. It is given by a health care professional. A copy of Vaccine Information Statements will be given before each vaccination. Read this sheet carefully each time. The sheet may change frequently. Talk to your pediatrician regarding the use of this medicine in children. Special care may be needed. Overdosage: If you think you have taken too much of this medicine contact a poison control center or emergency room at once. NOTE: This medicine is only for you. Do not share this medicine with others. What if I miss a dose? This does not apply. What may interact with this medicine? -chemotherapy or radiation therapy -medicines that lower your immune system like etanercept, anakinra, infliximab, and  adalimumab -medicines that treat or prevent blood clots like warfarin -phenytoin -steroid medicines like prednisone or cortisone -theophylline -vaccines This list may not describe all possible interactions. Give your health care provider a list of all the medicines, herbs, non-prescription drugs, or dietary supplements you use. Also tell them if you smoke, drink alcohol, or use illegal drugs. Some items may interact with your medicine. What should I watch for while using this medicine? Report any side effects that do not go away within 3 days to your doctor or health care professional. Call your health care provider if any unusual symptoms occur within 6 weeks of receiving this vaccine. You may still catch the flu, but the illness is not usually as bad. You cannot get the flu from the vaccine. The vaccine will not protect against colds or other illnesses that may cause fever. The vaccine is needed every year. What side effects may I notice from receiving this medicine? Side effects that you should report to your doctor or health care professional as soon as possible: -allergic reactions like skin rash, itching or hives, swelling of the face, lips, or tongue Side effects that usually do not require medical attention (report to your doctor or health care professional if they continue or are bothersome): -fever -headache -muscle aches and pains -pain, tenderness, redness, or swelling at site where injected -weak or tired This list may not describe all possible side effects. Call your doctor for medical advice about side effects. You may report side effects to FDA at 1-800-FDA-1088. Where should I keep my medicine? This vaccine is only given in a clinic, pharmacy, doctor's office, or other health care setting and will not be  stored at home. NOTE: This sheet is a summary. It may not cover all possible information. If you have questions about this medicine, talk to your doctor, pharmacist, or health  care provider.  2018 Elsevier/Gold Standard (2007-11-13 09:30:40)

## 2018-03-18 NOTE — Progress Notes (Signed)
[id number]

## 2018-03-26 ENCOUNTER — Other Ambulatory Visit: Payer: Self-pay

## 2018-04-06 ENCOUNTER — Other Ambulatory Visit: Payer: Self-pay | Admitting: Internal Medicine

## 2018-04-06 DIAGNOSIS — I10 Essential (primary) hypertension: Secondary | ICD-10-CM

## 2018-05-22 ENCOUNTER — Encounter (HOSPITAL_COMMUNITY): Payer: Self-pay

## 2018-05-22 ENCOUNTER — Ambulatory Visit (HOSPITAL_COMMUNITY)
Admission: EM | Admit: 2018-05-22 | Discharge: 2018-05-22 | Disposition: A | Discharge status: Home / Self Care | Payer: Medicaid Other | Attending: Family Medicine | Admitting: Family Medicine

## 2018-05-22 DIAGNOSIS — J209 Acute bronchitis, unspecified: Secondary | ICD-10-CM | POA: Diagnosis not present

## 2018-05-22 MED ORDER — HYDROCODONE-HOMATROPINE 5-1.5 MG/5ML PO SYRP: 5.0000 mL | ORAL_SOLUTION | Freq: Four times a day (QID) | ORAL | 0 refills | Status: DC | PRN

## 2018-05-22 MED ORDER — AZITHROMYCIN 200 MG/5ML PO SUSR: 200.0000 mg | Freq: Every day | ORAL | 0 refills | Status: DC

## 2018-05-22 MED ORDER — ALBUTEROL SULFATE HFA 108 (90 BASE) MCG/ACT IN AERS: 1.0000 | INHALATION_SPRAY | Freq: Four times a day (QID) | RESPIRATORY_TRACT | 0 refills | Status: DC | PRN

## 2018-05-22 NOTE — Discharge Instructions (Signed)
Please return if symptoms persist.

## 2018-05-22 NOTE — ED Provider Notes (Signed)
[place]    CSN: [id number] Arrival date & time: 05/22/18  1638     History   Chief Complaint Chief Complaint  Patient presents with  . Nasal Congestion  . Sinus Problem    HPI Stephanie Neal is a 63 y.o. female.   48 year old established [place] urgent care patient, female, complains of sinus congestion for 3 days.  Notes cough at night.  Clear phlegm.  No shortness of breath, fever, chills.  Smokes 3 cigarettes daily.     Past Medical History:  Diagnosis Date  . Diabetes mellitus   . Hyperlipidemia   . Hypertension     Patient Active Problem List   Diagnosis Date Noted  . Stress due to family tension 11/13/2017  . DM type 2 (diabetes mellitus, type 2) ([place]) 03/05/2014  . HYPERCHOLESTEROLEMIA 03/12/2007  . OBESITY 03/12/2007  . TOBACCO ABUSE 03/12/2007  . HTN (hypertension) 03/12/2007  . ALLERGIC RHINITIS, SEASONAL 03/12/2007    History reviewed. No pertinent surgical history.  OB History   No obstetric history on file.      Home Medications    Prior to Admission medications   Medication Sig Start Date End Date Taking? Authorizing Provider  albuterol (PROVENTIL HFA;VENTOLIN HFA) 108 (90 Base) MCG/ACT inhaler Inhale 1-2 puffs into the lungs every 6 (six) hours as needed for wheezing or shortness of breath. 05/22/18   Robyn Haber, MD  amLODipine (NORVASC) 10 MG tablet Take 1 tablet (10 mg total) by mouth daily. 03/18/18   Ladell Pier, MD  aspirin EC 81 MG tablet Take 1 tablet (81 mg total) by mouth daily. 03/18/18   Ladell Pier, MD  atorvastatin (LIPITOR) 40 MG tablet Take 1 tablet (40 mg total) by mouth daily. 05/24/17   Ladell Pier, MD  azithromycin (ZITHROMAX) 200 MG/5ML suspension Take 5 mLs (200 mg total) by mouth daily. 05/22/18   Robyn Haber, MD  diclofenac sodium (VOLTAREN) 1 % GEL Apply 1 application topically 4 (four) times daily. 05/24/16   Janne Napoleon, NP  EMBRACE LANCETS ULTRA THIN 95K MISC 1  applicator by Does not apply route 2 (two) times daily. 08/24/17   Ladell Pier, MD  fluticasone (FLONASE) 50 MCG/ACT nasal spray Place 2 sprays into both nostrils daily. 04/19/15   Lance Bosch, NP  glimepiride (AMARYL) 2 MG tablet Take 1 tablet (2 mg total) by mouth daily before breakfast. 11/14/17   Ladell Pier, MD  glucose blood test strip Check BS once daily. Stripes for  Free style meter 05/24/17   Ladell Pier, MD  glucose monitoring kit (FREESTYLE) monitoring kit 1 each by Does not apply route as needed for other. 05/24/17   Ladell Pier, MD  HYDROcodone-homatropine (HYDROMET) 5-1.5 MG/5ML syrup Take 5 mLs by mouth every 6 (six) hours as needed for cough. 05/22/18   Robyn Haber, MD  lisinopril (PRINIVIL,ZESTRIL) 20 MG tablet TAKE 1 TABLET BY MOUTH EVERY DAY 04/08/18   Ladell Pier, MD  metFORMIN (GLUCOPHAGE) 1000 MG tablet Take 1 tablet (1,000 mg total) by mouth 2 (two) times daily with a meal. For diabetes 05/24/17   Ladell Pier, MD    Family History Family History  Problem Relation Age of Onset  . Hypertension Mother   . Diabetes Mother   . COPD Father   . Heart disease Father   . Diabetes Father     Social History Social History   Tobacco Use  . Smoking status: Current Every Day  Smoker    Packs/day: 0.25    Types: Cigarettes  . Smokeless tobacco: Never Used  Substance Use Topics  . Alcohol use: No  . Drug use: No     Allergies   Penicillins   Review of Systems Review of Systems   Physical Exam Triage Vital Signs ED Triage Vitals  Enc Vitals Group     BP 05/22/18 1700 138/61     Pulse Rate 05/22/18 1700 (!) 114     Resp 05/22/18 1700 16     Temp 05/22/18 1700 98.2 F (36.8 C)     Temp Source 05/22/18 1700 Oral     SpO2 05/22/18 1700 96 %     Weight --      Height --      Head Circumference --      Peak Flow --      Pain Score 05/22/18 1701 0     Pain Loc --      Pain Edu? --      Excl. in [place]? --    No data  found.  Updated Vital Signs BP 138/61 (BP Location: Right Arm)   Pulse (!) 114   Temp 98.2 F (36.8 C) (Oral)   Resp 16   SpO2 96%    Physical Exam Vitals signs and nursing note reviewed.  Constitutional:      Appearance: Normal appearance. She is not ill-appearing.  HENT:     Head: Normocephalic.     Nose:     Comments: Turbinates are mildly inflamed.    Mouth/Throat:     Mouth: Mucous membranes are moist.     Pharynx: Posterior oropharyngeal erythema present. No oropharyngeal exudate.  Eyes:     Comments: Injected sclera bilaterally  Neck:     Musculoskeletal: Normal range of motion and neck supple.  Cardiovascular:     Rate and Rhythm: Regular rhythm. Tachycardia present.  Pulmonary:     Effort: Pulmonary effort is normal.     Breath sounds: Rhonchi present.     Comments: Raspy cough Musculoskeletal: Normal range of motion.  Skin:    General: Skin is warm and dry.  Neurological:     General: No focal deficit present.     Mental Status: She is alert and oriented to person, place, and time.  Psychiatric:        Mood and Affect: Mood normal.        Behavior: Behavior normal.      UC Treatments / Results  Labs (all labs ordered are listed, but only abnormal results are displayed) Labs Reviewed - No data to display  EKG None  Radiology No results found.  Procedures Procedures (including critical care time)  Medications Ordered in UC Medications - No data to display  Initial Impression / Assessment and Plan / UC Course  I have reviewed the triage vital signs and the nursing notes.  Pertinent labs & imaging results that were available during my care of the patient were reviewed by me and considered in my medical decision making (see chart for details).    Final Clinical Impressions(s) / UC Diagnoses   Final diagnoses:  Acute bronchitis, unspecified organism     Discharge Instructions     Please return if symptoms persist     ED  Prescriptions    Medication Sig Dispense Auth. Provider   albuterol (PROVENTIL HFA;VENTOLIN HFA) 108 (90 Base) MCG/ACT inhaler Inhale 1-2 puffs into the lungs every 6 (six) hours as needed for wheezing or  shortness of breath. 1 Inhaler Robyn Haber, MD   HYDROcodone-homatropine (HYDROMET) 5-1.5 MG/5ML syrup Take 5 mLs by mouth every 6 (six) hours as needed for cough. 60 mL Robyn Haber, MD   azithromycin (ZITHROMAX) 200 MG/5ML suspension Take 5 mLs (200 mg total) by mouth daily. 22.5 mL Robyn Haber, MD     Controlled Substance Prescriptions [place] Controlled Substance Registry consulted? Not Applicable   Robyn Haber, MD 05/22/18 1726

## 2018-05-22 NOTE — ED Triage Notes (Signed)
Pt present nasal congestion and sinus issues that started 5 days ago. Pt has tried OTC medication with no relief.

## 2018-06-04 ENCOUNTER — Other Ambulatory Visit: Payer: Self-pay | Admitting: Internal Medicine

## 2018-06-25 ENCOUNTER — Ambulatory Visit: Payer: Medicaid Other | Attending: Internal Medicine | Admitting: Internal Medicine

## 2018-06-25 ENCOUNTER — Encounter: Payer: Self-pay | Admitting: Internal Medicine

## 2018-06-25 VITALS — BP 145/85 | HR 86 | Temp 98.4°F | Resp 16 | Ht 72.0 in | Wt 201.2 lb

## 2018-06-25 DIAGNOSIS — Z833 Family history of diabetes mellitus: Secondary | ICD-10-CM | POA: Diagnosis not present

## 2018-06-25 DIAGNOSIS — Z79899 Other long term (current) drug therapy: Secondary | ICD-10-CM | POA: Insufficient documentation

## 2018-06-25 DIAGNOSIS — Z1211 Encounter for screening for malignant neoplasm of colon: Secondary | ICD-10-CM

## 2018-06-25 DIAGNOSIS — Z7984 Long term (current) use of oral hypoglycemic drugs: Secondary | ICD-10-CM | POA: Insufficient documentation

## 2018-06-25 DIAGNOSIS — IMO0001 Reserved for inherently not codable concepts without codable children: Secondary | ICD-10-CM

## 2018-06-25 DIAGNOSIS — E119 Type 2 diabetes mellitus without complications: Secondary | ICD-10-CM | POA: Diagnosis not present

## 2018-06-25 DIAGNOSIS — Z7982 Long term (current) use of aspirin: Secondary | ICD-10-CM | POA: Diagnosis not present

## 2018-06-25 DIAGNOSIS — F17210 Nicotine dependence, cigarettes, uncomplicated: Secondary | ICD-10-CM | POA: Insufficient documentation

## 2018-06-25 DIAGNOSIS — Z880 Allergy status to penicillin: Secondary | ICD-10-CM | POA: Diagnosis not present

## 2018-06-25 DIAGNOSIS — E1165 Type 2 diabetes mellitus with hyperglycemia: Secondary | ICD-10-CM | POA: Diagnosis not present

## 2018-06-25 DIAGNOSIS — Z8249 Family history of ischemic heart disease and other diseases of the circulatory system: Secondary | ICD-10-CM | POA: Insufficient documentation

## 2018-06-25 DIAGNOSIS — E782 Mixed hyperlipidemia: Secondary | ICD-10-CM | POA: Diagnosis not present

## 2018-06-25 DIAGNOSIS — I10 Essential (primary) hypertension: Secondary | ICD-10-CM | POA: Insufficient documentation

## 2018-06-25 DIAGNOSIS — Z6827 Body mass index (BMI) 27.0-27.9, adult: Secondary | ICD-10-CM | POA: Insufficient documentation

## 2018-06-25 DIAGNOSIS — E663 Overweight: Secondary | ICD-10-CM | POA: Insufficient documentation

## 2018-06-25 DIAGNOSIS — E669 Obesity, unspecified: Secondary | ICD-10-CM | POA: Diagnosis not present

## 2018-06-25 DIAGNOSIS — F17200 Nicotine dependence, unspecified, uncomplicated: Secondary | ICD-10-CM

## 2018-06-25 DIAGNOSIS — Z1239 Encounter for other screening for malignant neoplasm of breast: Secondary | ICD-10-CM

## 2018-06-25 LAB — POCT GLYCOSYLATED HEMOGLOBIN (HGB A1C): HbA1c, POC (controlled diabetic range): 8.5 % — AB (ref 0.0–7.0)

## 2018-06-25 LAB — GLUCOSE, POCT (MANUAL RESULT ENTRY): POC Glucose: 135 mg/dl — AB (ref 70–99)

## 2018-06-25 MED ORDER — ASPIRIN 81 MG PO CHEW: 81.0000 mg | CHEWABLE_TABLET | Freq: Every day | ORAL | 2 refills | Status: DC

## 2018-06-25 MED ORDER — GLIMEPIRIDE 2 MG PO TABS: 2.0000 mg | ORAL_TABLET | Freq: Two times a day (BID) | ORAL | 12 refills | Status: DC

## 2018-06-25 MED ORDER — LISINOPRIL 30 MG PO TABS: 30.0000 mg | ORAL_TABLET | Freq: Every day | ORAL | 2 refills | Status: DC

## 2018-06-25 MED ORDER — ATORVASTATIN CALCIUM 40 MG PO TABS: 40.0000 mg | ORAL_TABLET | Freq: Every day | ORAL | 3 refills | Status: DC

## 2018-06-25 MED ORDER — GLIMEPIRIDE 2 MG PO TABS: 2.0000 mg | ORAL_TABLET | Freq: Every day | ORAL | 12 refills | Status: DC

## 2018-06-25 NOTE — Progress Notes (Signed)
Pt is requesting to have the 81mg  aspirin changed to the chewable tablet

## 2018-06-25 NOTE — Progress Notes (Signed)
Patient ID: Stephanie Neal, female    DOB: 1955/11/14  MRN: [id number]  CC: Chronic disease management  Subjective: Stephanie Neal is a 63 y.o. female who presents for chronic disease management Her concerns today include:  Hx of DM, HTN, tob, obesity, HL,  DIABETES TYPE 2 Last A1C:   Results for orders placed or performed in visit on 06/25/18  POCT glucose (manual entry)  Result Value Ref Range   POC Glucose 135 (A) 70 - 99 mg/dl  POCT glycosylated hemoglobin (Hb A1C)  Result Value Ref Range   Hemoglobin A1C     HbA1c POC (<> result, manual entry)     HbA1c, POC (prediabetic range)     HbA1c, POC (controlled diabetic range) 8.5 (A) 0.0 - 7.0 %    Med Adherence:  '[x]'$  Yes.  Amaryl increased on last visit to 2 mg daily.  She is compliant with Metformin and Amaryl    '[]'$  No Medication side effects:  '[]'$  Yes    '[x]'$  No Home Monitoring?  '[]'$  Yes    '[x]'$  No Home glucose results range: Diet Adherence: '[x]'$  Yes -trying to stay away from pork and eating more veggies.  Drinks water but "I do drink a little bit of Pepsi every now and then."    '[]'$  No Exercise: '[]'$  Yes    '[x]'$  No but does a little stretching exercises Hypoglycemic episodes?: '[]'$  Yes    '[x]'$  No Numbness of the feet? '[]'$  Yes    '[x]'$  No Retinopathy hx? '[]'$  Yes    '[]'$  No Last eye exam:  Comments: wgh up a few lbs but pt layered up today and has on boots  HYPERTENSION Currently taking: see medication list Med Adherence: '[x]'$  Yes    '[]'$  No Medication side effects: '[]'$  Yes    '[x]'$  No Adherence with salt restriction: '[x]'$  Yes    '[]'$  No Home Monitoring?: '[]'$  Yes    '[x]'$  No, but has a wrist device Monitoring Frequency: '[]'$  Yes    '[]'$  No Home BP results range: '[]'$  Yes    '[]'$  No SOB? '[]'$  Yes    '[x]'$  No Chest Pain?: '[]'$  Yes    '[x]'$  No Leg swelling?: '[]'$  Yes    '[x]'$  No Headaches?: '[]'$  Yes    '[x]'$  No Dizziness? '[]'$  Yes    '[x]'$  No Comments:   HL:  Out of Lipitor x 1 mth  Tobacco dependence: Down to 2 cigarettes a day from 5 a day.  She states that it is hard  but she is really trying to quit.  Seen at UC last mth for UR symptoms.  Dx with bronchitis.  Still has a little cough but reports that overall she is much improved  HM:  Due for MMG and c-scope.  Received a letter from Dr. Benson [place] informing her that she is due for 5-year repeat colonoscopy  Patient Active Problem List   Diagnosis Date Noted  . Stress due to family tension 11/13/2017  . DM type 2 (diabetes mellitus, type 2) ([place]) 03/05/2014  . HYPERCHOLESTEROLEMIA 03/12/2007  . OBESITY 03/12/2007  . TOBACCO ABUSE 03/12/2007  . HTN (hypertension) 03/12/2007  . ALLERGIC RHINITIS, SEASONAL 03/12/2007     Current Outpatient Medications on File Prior to Visit  Medication Sig Dispense Refill  . albuterol (PROVENTIL HFA;VENTOLIN HFA) 108 (90 Base) MCG/ACT inhaler Inhale 1-2 puffs into the lungs every 6 (six) hours as needed for wheezing or shortness of breath. 1 Inhaler 0  . amLODipine (NORVASC) 10 MG tablet Take  1 tablet (10 mg total) by mouth daily. 30 tablet 6  . EMBRACE LANCETS ULTRA THIN 09O MISC 1 applicator by Does not apply route 2 (two) times daily. 100 each 11  . fluticasone (FLONASE) 50 MCG/ACT nasal spray Place 2 sprays into both nostrils daily. (Patient not taking: Reported on 06/25/2018) 16 g 6  . glucose blood test strip Check BS once daily. Stripes for  Free style meter 100 each 12  . glucose monitoring kit (FREESTYLE) monitoring kit 1 each by Does not apply route as needed for other. 1 each 0  . metFORMIN (GLUCOPHAGE) 1000 MG tablet Take 1 tablet (1,000 mg total) by mouth 2 (two) times daily with a meal. For diabetes 60 tablet 5   No current facility-administered medications on file prior to visit.     Allergies  Allergen Reactions  . Penicillins Other (See Comments)    "blanked out" Has patient had a PCN reaction causing immediate rash, facial/tongue/throat swelling, SOB or lightheadedness with hypotension: No Has patient had a PCN reaction causing severe rash involving  mucus membranes or skin necrosis: No Has patient had a PCN reaction that required hospitalization No Has patient had a PCN reaction occurring within the last 10 years: No If all of the above answers are "NO", then may proceed with Cephalosporin use.     Social History   Socioeconomic History  . Marital status: Single    Spouse name: Not on file  . Number of children: Not on file  . Years of education: Not on file  . Highest education level: Not on file  Occupational History  . Not on file  Social Needs  . Financial resource strain: Not on file  . Food insecurity:    Worry: Not on file    Inability: Not on file  . Transportation needs:    Medical: Not on file    Non-medical: Not on file  Tobacco Use  . Smoking status: Current Every Day Smoker    Packs/day: 0.25    Types: Cigarettes  . Smokeless tobacco: Never Used  Substance and Sexual Activity  . Alcohol use: No  . Drug use: No  . Sexual activity: Not on file  Lifestyle  . Physical activity:    Days per week: Not on file    Minutes per session: Not on file  . Stress: Not on file  Relationships  . Social connections:    Talks on phone: Not on file    Gets together: Not on file    Attends religious service: Not on file    Active member of club or organization: Not on file    Attends meetings of clubs or organizations: Not on file    Relationship status: Not on file  . Intimate partner violence:    Fear of current or ex partner: Not on file    Emotionally abused: Not on file    Physically abused: Not on file    Forced sexual activity: Not on file  Other Topics Concern  . Not on file  Social History Narrative  . Not on file    Family History  Problem Relation Age of Onset  . Hypertension Mother   . Diabetes Mother   . COPD Father   . Heart disease Father   . Diabetes Father     No past surgical history on file.  ROS: Review of Systems Negative except as stated above  PHYSICAL EXAM: BP (!) 145/85    Pulse 86  Temp 98.4 F (36.9 C) (Oral)   Resp 16   Ht 6' (1.829 m)   Wt 201 lb 3.2 oz (91.3 kg)   SpO2 97%   BMI 27.29 kg/m   Wt Readings from Last 3 Encounters:  06/25/18 201 lb 3.2 oz (91.3 kg)  03/18/18 199 lb (90.3 kg)  11/13/17 196 lb 6.4 oz (89.1 kg)   BP 138/88 Physical Exam General appearance - alert, well appearing, and in no distress Mental status - normal mood, behavior, speech, dress, motor activity, and thought processes Neck - supple, no significant adenopathy Chest - clear to auscultation, no wheezes, rales or rhonchi, symmetric air entry Heart - normal rate, regular rhythm, normal S1, S2, no murmurs, rubs, clicks or gallops Extremities - peripheral pulses normal, no pedal edema, no clubbing or cyanosis MSK: Mild soft tissue enlargement lateral right knee below the joint line Diabetic Foot Exam - Simple   Simple Foot Form Visual Inspection See comments:  Yes Sensation Testing Intact to touch and monofilament testing bilaterally:  Yes Pulse Check Posterior Tibialis and Dorsalis pulse intact bilaterally:  Yes Comments Noninflamed bunion on both big toes     CMP Latest Ref Rng & Units 05/24/2017 08/21/2016 07/26/2016  Glucose 65 - 99 mg/dL 128(H) 92 127(H)  BUN 8 - 27 mg/dL '9 15 13  '$ Creatinine 0.57 - 1.00 mg/dL 0.94 1.10(H) 0.97  Sodium 134 - 144 mmol/L 142 136 139  Potassium 3.5 - 5.2 mmol/L 4.4 3.4(L) 4.2  Chloride 96 - 106 mmol/L 102 98(L) 99  CO2 20 - 29 mmol/L 26 - 26  Calcium 8.7 - 10.3 mg/dL 9.6 - 9.5  Total Protein 6.0 - 8.5 g/dL 6.3 - -  Total Bilirubin 0.0 - 1.2 mg/dL 0.2 - -  Alkaline Phos 39 - 117 IU/L 61 - -  AST 0 - 40 IU/L 13 - -  ALT 0 - 32 IU/L 8 - -   Lipid Panel     Component Value Date/Time   CHOL 171 05/24/2017 1046   TRIG 94 05/24/2017 1046   HDL 49 05/24/2017 1046   CHOLHDL 3.5 05/24/2017 1046   CHOLHDL 5.6 (H) 06/09/2015 1023   VLDL 22 06/09/2015 1023   LDLCALC 103 (H) 05/24/2017 1046    CBC    Component Value  Date/Time   WBC 4.8 05/24/2017 1046   WBC 5.4 09/08/2013 0859   RBC 4.99 05/24/2017 1046   RBC 4.68 09/08/2013 0859   HGB 13.6 05/24/2017 1046   HCT 39.3 05/24/2017 1046   PLT 221 05/24/2017 1046   MCV 79 05/24/2017 1046   MCH 27.3 05/24/2017 1046   MCH 27.8 09/08/2013 0859   MCHC 34.6 05/24/2017 1046   MCHC 33.9 09/08/2013 0859   RDW 15.3 05/24/2017 1046   LYMPHSABS 1.7 01/05/2011 1513   MONOABS 0.3 01/05/2011 1513   EOSABS 0.1 01/05/2011 1513   BASOSABS 0.0 01/05/2011 1513    ASSESSMENT AND PLAN: 1. Diabetes mellitus type 2, uncontrolled, without complications (HCC) Dietary counseling given.  Encouraged her to be more active.  Encouraged her to check blood sugars at least once a day.  Increase Amaryl to 2 mg twice a day - POCT glucose (manual entry) - POCT glycosylated hemoglobin (Hb A1C) - CBC - Comprehensive metabolic panel - Lipid panel - glimepiride (AMARYL) 2 MG tablet; Take 1 tablet (2 mg total) by mouth daily before breakfast.  Dispense: 60 tablet; Refill: 12 - Ambulatory referral to Ophthalmology  2. Essential hypertension Not at goal.  Increase  lisinopril to 30 mg daily.  Continue amlodipine.  Continue DASH diet - lisinopril (PRINIVIL,ZESTRIL) 30 MG tablet; Take 1 tablet (30 mg total) by mouth daily.  Dispense: 90 tablet; Refill: 2 - aspirin 81 MG chewable tablet; Chew 1 tablet (81 mg total) by mouth daily.  Dispense: 100 tablet; Refill: 2  3. Tobacco dependence Continue to encourage her to avoid smoking cessation.  She is not interested in trying any products to help her quit.  She plans to continue to cut back.  Again I encouraged her to set a quit date  4. Screening for breast cancer - MM DIGITAL SCREENING BILATERAL; Future  5. Mixed hyperlipidemia - atorvastatin (LIPITOR) 40 MG tablet; Take 1 tablet (40 mg total) by mouth daily.  Dispense: 90 tablet; Refill: 3  6. Colon cancer screening - Ambulatory referral to Gastroenterology  7. Over weight See #1  above    Patient was given the opportunity to ask questions.  Patient verbalized understanding of the plan and was able to repeat key elements of the plan.   Orders Placed This Encounter  Procedures  . MM DIGITAL SCREENING BILATERAL  . CBC  . Comprehensive metabolic panel  . Lipid panel  . Ambulatory referral to Gastroenterology  . Ambulatory referral to Ophthalmology  . POCT glucose (manual entry)  . POCT glycosylated hemoglobin (Hb A1C)     Requested Prescriptions   Signed Prescriptions Disp Refills  . atorvastatin (LIPITOR) 40 MG tablet 90 tablet 3    Sig: Take 1 tablet (40 mg total) by mouth daily.  Marland Kitchen lisinopril (PRINIVIL,ZESTRIL) 30 MG tablet 90 tablet 2    Sig: Take 1 tablet (30 mg total) by mouth daily.  Marland Kitchen aspirin 81 MG chewable tablet 100 tablet 2    Sig: Chew 1 tablet (81 mg total) by mouth daily.  Marland Kitchen glimepiride (AMARYL) 2 MG tablet 60 tablet 12    Sig: Take 1 tablet (2 mg total) by mouth 2 (two) times daily.    Return in about 3 months (around 09/23/2018).  Karle Plumber, MD, FACP

## 2018-06-25 NOTE — Patient Instructions (Signed)
The goal for your blood pressure is 130/80 or lower.  Please try to check your blood pressure at least twice a week and record the readings.  We have increased lisinopril to 30 mg daily.  Your diabetes is not controlled.  Please try to check your blood sugars at least once a day.  We have increased the glimepiride to 2 mg twice a day.

## 2018-06-26 LAB — COMPREHENSIVE METABOLIC PANEL
ALT: 8 IU/L (ref 0–32)
AST: 11 IU/L (ref 0–40)
Albumin/Globulin Ratio: 1.7 (ref 1.2–2.2)
Albumin: 3.9 g/dL (ref 3.8–4.8)
Alkaline Phosphatase: 67 IU/L (ref 39–117)
BUN/Creatinine Ratio: 15 (ref 12–28)
BUN: 11 mg/dL (ref 8–27)
Bilirubin Total: <0.2 mg/dL (ref 0.0–1.2)
CO2: 23 mmol/L (ref 20–29)
CREATININE: 0.75 mg/dL (ref 0.57–1.00)
Calcium: 9.3 mg/dL (ref 8.7–10.3)
Chloride: 104 mmol/L (ref 96–106)
GFR calc Af Amer: 97 mL/min/{1.73_m2} (ref >59)
GFR calc non Af Amer: 85 mL/min/{1.73_m2} (ref >59)
GLUCOSE: 83 mg/dL (ref 65–99)
Globulin, Total: 2.3 g/dL (ref 1.5–4.5)
Potassium: 4.1 mmol/L (ref 3.5–5.2)
Sodium: 142 mmol/L (ref 134–144)
Total Protein: 6.2 g/dL (ref 6.0–8.5)

## 2018-06-26 LAB — LIPID PANEL
Chol/HDL Ratio: 5.4 ratio — ABNORMAL HIGH (ref 0.0–4.4)
Cholesterol, Total: 237 mg/dL — ABNORMAL HIGH (ref 100–199)
HDL: 44 mg/dL (ref >39)
LDL Calculated: 168 mg/dL — ABNORMAL HIGH (ref 0–99)
Triglycerides: 123 mg/dL (ref 0–149)
VLDL Cholesterol Cal: 25 mg/dL (ref 5–40)

## 2018-06-26 LAB — CBC
Hematocrit: 38.1 % (ref 34.0–46.6)
Hemoglobin: 13 g/dL (ref 11.1–15.9)
MCH: 27.4 pg (ref 26.6–33.0)
MCHC: 34.1 g/dL (ref 31.5–35.7)
MCV: 80 fL (ref 79–97)
Platelets: 210 10*3/uL (ref 150–450)
RBC: 4.74 x10E6/uL (ref 3.77–5.28)
RDW: 13.9 % (ref 11.7–15.4)
WBC: 5.5 10*3/uL (ref 3.4–10.8)

## 2018-06-28 ENCOUNTER — Telehealth: Payer: Self-pay

## 2018-06-28 NOTE — Telephone Encounter (Signed)
Contacted pt to go over lab results pt is aware and doesn't have any questions or concerns 

## 2018-07-31 ENCOUNTER — Other Ambulatory Visit: Payer: Self-pay | Admitting: Internal Medicine

## 2018-07-31 DIAGNOSIS — E1165 Type 2 diabetes mellitus with hyperglycemia: Principal | ICD-10-CM

## 2018-07-31 DIAGNOSIS — IMO0001 Reserved for inherently not codable concepts without codable children: Secondary | ICD-10-CM

## 2018-08-01 NOTE — Telephone Encounter (Signed)
Pt called to follow up on her metFORMIN (GLUCOPHAGE) 1000 MG tablet med

## 2018-09-26 ENCOUNTER — Encounter: Payer: Self-pay | Admitting: Internal Medicine

## 2018-09-26 ENCOUNTER — Ambulatory Visit: Payer: Medicaid Other | Attending: Internal Medicine | Admitting: Internal Medicine

## 2018-09-26 ENCOUNTER — Other Ambulatory Visit: Payer: Self-pay

## 2018-09-26 DIAGNOSIS — I10 Essential (primary) hypertension: Secondary | ICD-10-CM | POA: Diagnosis not present

## 2018-09-26 DIAGNOSIS — F17200 Nicotine dependence, unspecified, uncomplicated: Secondary | ICD-10-CM | POA: Diagnosis not present

## 2018-09-26 DIAGNOSIS — Z79899 Other long term (current) drug therapy: Secondary | ICD-10-CM | POA: Insufficient documentation

## 2018-09-26 DIAGNOSIS — Z7984 Long term (current) use of oral hypoglycemic drugs: Secondary | ICD-10-CM | POA: Diagnosis not present

## 2018-09-26 DIAGNOSIS — F17210 Nicotine dependence, cigarettes, uncomplicated: Secondary | ICD-10-CM | POA: Insufficient documentation

## 2018-09-26 DIAGNOSIS — IMO0001 Reserved for inherently not codable concepts without codable children: Secondary | ICD-10-CM

## 2018-09-26 DIAGNOSIS — E782 Mixed hyperlipidemia: Secondary | ICD-10-CM

## 2018-09-26 DIAGNOSIS — Z7982 Long term (current) use of aspirin: Secondary | ICD-10-CM | POA: Insufficient documentation

## 2018-09-26 DIAGNOSIS — Z1239 Encounter for other screening for malignant neoplasm of breast: Secondary | ICD-10-CM

## 2018-09-26 DIAGNOSIS — E669 Obesity, unspecified: Secondary | ICD-10-CM | POA: Diagnosis not present

## 2018-09-26 DIAGNOSIS — E1165 Type 2 diabetes mellitus with hyperglycemia: Secondary | ICD-10-CM | POA: Diagnosis not present

## 2018-09-26 MED ORDER — METFORMIN HCL 1000 MG PO TABS: 1000.0000 mg | ORAL_TABLET | Freq: Two times a day (BID) | ORAL | 6 refills | Status: DC

## 2018-09-26 MED ORDER — AMLODIPINE BESYLATE 10 MG PO TABS: 10.0000 mg | ORAL_TABLET | Freq: Every day | ORAL | 6 refills | Status: DC

## 2018-09-26 MED ORDER — ACCU-CHEK GUIDE ME W/DEVICE KIT: 1.0000 | PACK | Freq: Every day | 0 refills | Status: DC

## 2018-09-26 MED ORDER — ACCU-CHEK FASTCLIX LANCETS MISC: 12 refills | Status: DC

## 2018-09-26 MED ORDER — ACCU-CHEK FASTCLIX LANCET KIT: PACK | 12 refills | Status: DC

## 2018-09-26 MED ORDER — GLUCOSE BLOOD VI STRP: ORAL_STRIP | 12 refills | Status: DC

## 2018-09-26 NOTE — Progress Notes (Signed)
Virtual Visit via Telephone Note Due to current restrictions/limitations of in-office visits due to the COVID-19 pandemic, this scheduled clinical appointment was converted to a telehealth visit  I connected with Stephanie Neal on 09/26/18 at 11:53 a.m EDT by telephone and verified that I am speaking with the correct person using two identifiers. I am in my office.  The patient is at home.  Only the patient and myself participated in this encounter.  I discussed the limitations, risks, security and privacy concerns of performing an evaluation and management service by telephone and the availability of in person appointments. I also discussed with the patient that there may be a patient responsible charge related to this service. The patient expressed understanding and agreed to proceed.   History of Present Illness: Hx of DM, HTN, tob, obesity, HL.  Last seen 06/2018.  DM:  Amaryl increased to 2 mg BID on last visit.  Compliant with Metformin and Amaryl -no checking BS because current meter not working probably; it is 64 yrs old.  Needs new meter -feels she is doing good with her eating habits.  Eating more veggies and less meat.  Drinks OJ occasionally but dilutes with 1/2 water -no blurred vision.  Referred for eye exam on last visit.  Appt was rescheduled due to COVID  HM:  Referred for MMG and c-scope.  States she was called by GI and c-scope was cancelled due to COVID 19 pandemic.  Rescheduled for end of June for c-scope.  MMG was cancel for same reason but they have not reschedule  HTN:  Has a wrist device but not checking BP Reports compliance with meds - Norvasc and Lisinopril Limits salt in foods.  Uses Ms. DASH No CP/SOB/LE  HL:  Tolerating Lipitor  Tob dep:  "I'm dong great."  Smoking about 2 cig/day.  "I'm getting there."    Outpatient Encounter Medications as of 09/26/2018  Medication Sig  . albuterol (PROVENTIL HFA;VENTOLIN HFA) 108 (90 Base) MCG/ACT inhaler Inhale 1-2 puffs  into the lungs every 6 (six) hours as needed for wheezing or shortness of breath.  Marland Kitchen amLODipine (NORVASC) 10 MG tablet Take 1 tablet (10 mg total) by mouth daily.  Marland Kitchen aspirin 81 MG chewable tablet Chew 1 tablet (81 mg total) by mouth daily.  Marland Kitchen atorvastatin (LIPITOR) 40 MG tablet Take 1 tablet (40 mg total) by mouth daily.  Marland Kitchen EMBRACE LANCETS ULTRA THIN 22Q MISC 1 applicator by Does not apply route 2 (two) times daily.  . fluticasone (FLONASE) 50 MCG/ACT nasal spray Place 2 sprays into both nostrils daily. (Patient not taking: Reported on 06/25/2018)  . glimepiride (AMARYL) 2 MG tablet Take 1 tablet (2 mg total) by mouth 2 (two) times daily.  Marland Kitchen glucose blood test strip Check BS once daily. Stripes for  Free style meter  . glucose monitoring kit (FREESTYLE) monitoring kit 1 each by Does not apply route as needed for other.  . lisinopril (PRINIVIL,ZESTRIL) 30 MG tablet Take 1 tablet (30 mg total) by mouth daily.  . metFORMIN (GLUCOPHAGE) 1000 MG tablet TAKE 1 TABLET (1,000 MG TOTAL) BY MOUTH 2 (TWO) TIMES DAILY WITH A MEAL FOR DIABETES   No facility-administered encounter medications on file as of 09/26/2018.    Social History   Socioeconomic History  . Marital status: Single    Spouse name: Not on file  . Number of children: Not on file  . Years of education: Not on file  . Highest education level: Not on file  Occupational  History  . Not on file  Social Needs  . Financial resource strain: Not on file  . Food insecurity:    Worry: Not on file    Inability: Not on file  . Transportation needs:    Medical: Not on file    Non-medical: Not on file  Tobacco Use  . Smoking status: Current Every Day Smoker    Packs/day: 0.25    Types: Cigarettes  . Smokeless tobacco: Never Used  Substance and Sexual Activity  . Alcohol use: No  . Drug use: No  . Sexual activity: Not on file  Lifestyle  . Physical activity:    Days per week: Not on file    Minutes per session: Not on file  . Stress:  Not on file  Relationships  . Social connections:    Talks on phone: Not on file    Gets together: Not on file    Attends religious service: Not on file    Active member of club or organization: Not on file    Attends meetings of clubs or organizations: Not on file    Relationship status: Not on file  . Intimate partner violence:    Fear of current or ex partner: Not on file    Emotionally abused: Not on file    Physically abused: Not on file    Forced sexual activity: Not on file  Other Topics Concern  . Not on file  Social History Narrative  . Not on file     Observations/Objective: Lab Results  Component Value Date   WBC 5.5 06/25/2018   HGB 13.0 06/25/2018   HCT 38.1 06/25/2018   MCV 80 06/25/2018   PLT 210 06/25/2018     Chemistry      Component Value Date/Time   NA 142 06/25/2018 1132   K 4.1 06/25/2018 1132   CL 104 06/25/2018 1132   CO2 23 06/25/2018 1132   BUN 11 06/25/2018 1132   CREATININE 0.75 06/25/2018 1132   CREATININE 1.11 (H) 04/04/2016 1113      Component Value Date/Time   CALCIUM 9.3 06/25/2018 1132   ALKPHOS 67 06/25/2018 1132   AST 11 06/25/2018 1132   ALT 8 06/25/2018 1132   BILITOT <0.2 06/25/2018 1132       Assessment and Plan: 1. Diabetes mellitus type 2, uncontrolled, without complications (HCC) Last A1c was 8.5 3 months ago.  Patient declines coming to the lab to have A1c done at this time. -Prescription sent to her pharmacy for new diabetic testing supplies.  Encouraged her to check blood sugars at least once a day.  Continue current dose of metformin and Amaryl.  Continue to encourage healthy eating habits. - metFORMIN (GLUCOPHAGE) 1000 MG tablet; Take 1 tablet (1,000 mg total) by mouth 2 (two) times daily with a meal. For diabetes  Dispense: 60 tablet; Refill: 6 - Blood Glucose Monitoring Suppl (ACCU-CHEK GUIDE ME) w/Device KIT; 1 Device by Does not apply route daily.  Dispense: 1 kit; Refill: 0 - glucose blood (ACCU-CHEK GUIDE)  test strip; Test blood sugar once a day  Dispense: 100 each; Refill: 12 - Lancets Misc. (ACCU-CHEK FASTCLIX LANCET) KIT; Use as directed  Dispense: 1 kit; Refill: 12 - Accu-Chek FastClix Lancets MISC; Use as directed  Dispense: 100 each; Refill: 12  2. Essential hypertension Encourage patient to check blood pressure at least twice a week with goal being 130/80 or lower.  Patient wrote down this goal so that she knows when she checks  her blood pressure with the goal should be - amLODipine (NORVASC) 10 MG tablet; Take 1 tablet (10 mg total) by mouth daily.  Dispense: 30 tablet; Refill: 6  3. Tobacco dependence Commended her on the progress that she is making.  Strongly advised to quit.  Encouraged her to set a quit date.  Less than 5 minutes spent on counseling.  4. Mixed hyperlipidemia Continue atorvastatin  5. Breast cancer screening I have resubmitted referral for mammogram   Follow Up Instructions: Follow-up in 3 months   I discussed the assessment and treatment plan with the patient. The patient was provided an opportunity to ask questions and all were answered. The patient agreed with the plan and demonstrated an understanding of the instructions.   The patient was advised to call back or seek an in-person evaluation if the symptoms worsen or if the condition fails to improve as anticipated.  I provided 18 minutes of non-face-to-face time during this encounter.   Karle Plumber, MD

## 2018-12-30 ENCOUNTER — Ambulatory Visit: Payer: Medicaid Other | Attending: Internal Medicine | Admitting: Internal Medicine

## 2018-12-30 ENCOUNTER — Encounter: Payer: Self-pay | Admitting: Internal Medicine

## 2018-12-30 DIAGNOSIS — I10 Essential (primary) hypertension: Secondary | ICD-10-CM | POA: Diagnosis not present

## 2018-12-30 DIAGNOSIS — Z7984 Long term (current) use of oral hypoglycemic drugs: Secondary | ICD-10-CM | POA: Diagnosis not present

## 2018-12-30 DIAGNOSIS — Z1239 Encounter for other screening for malignant neoplasm of breast: Secondary | ICD-10-CM

## 2018-12-30 DIAGNOSIS — Z23 Encounter for immunization: Secondary | ICD-10-CM

## 2018-12-30 DIAGNOSIS — Z1231 Encounter for screening mammogram for malignant neoplasm of breast: Secondary | ICD-10-CM | POA: Insufficient documentation

## 2018-12-30 DIAGNOSIS — F17200 Nicotine dependence, unspecified, uncomplicated: Secondary | ICD-10-CM

## 2018-12-30 DIAGNOSIS — Z7982 Long term (current) use of aspirin: Secondary | ICD-10-CM | POA: Diagnosis not present

## 2018-12-30 DIAGNOSIS — E782 Mixed hyperlipidemia: Secondary | ICD-10-CM | POA: Diagnosis not present

## 2018-12-30 DIAGNOSIS — Z79899 Other long term (current) drug therapy: Secondary | ICD-10-CM | POA: Diagnosis not present

## 2018-12-30 DIAGNOSIS — E119 Type 2 diabetes mellitus without complications: Secondary | ICD-10-CM

## 2018-12-30 NOTE — Progress Notes (Signed)
Virtual Visit via Telephone Note Due to current restrictions/limitations of in-office visits due to the COVID-19 pandemic, this scheduled clinical appointment was converted to a telehealth visit  I connected with Stephanie Neal on 12/30/18 at 9:13 a.m by telephone and verified that I am speaking with the correct person using two identifiers. I am in my office.  The patient is at home.  Only the patient and myself participated in this encounter.  I discussed the limitations, risks, security and privacy concerns of performing an evaluation and management service by telephone and the availability of in person appointments. I also discussed with the patient that there may be a patient responsible charge related to this service. The patient expressed understanding and agreed to proceed.   History of Present Illness: Hx of DM, HTN, tob, obesity, HL.  Patient last evaluated 08/2018.  Purpose of today's visit is for chronic disease management.  DM: did get DM testing supplies as prescribed on last visit.  However, she is not checking BS.  States she was busying moving from one apartment to another place.  -reports compliance with Metformin and Amaryl BID -doing well with eating habits -just moved into an apartment with flight of stairs.  Getting exercises going up and down the stairs.  -Did not get eye exam as yet.  HYPERTENSION Currently taking: see medication list Med Adherence: '[x]'$  Yes    '[]'$  No Medication side effects: '[]'$  Yes    '[x]'$  No Adherence with salt restriction: '[x]'$  Yes    '[]'$  No Home Monitoring?: '[x]'$  Yes    '[]'$  No Monitoring Frequency:  Has not checked it since she moved earlier this mth. Reports levels were good Home BP results range: '[]'$  Yes    '[]'$  No SOB? '[]'$  Yes    '[x]'$  No Chest Pain?: '[]'$  Yes    '[x]'$  No Leg swelling?: '[]'$  Yes    '[x]'$  No Headaches?: '[]'$  Yes    '[x]'$  No Dizziness? '[]'$  Yes    '[x]'$  No Comments:   HL:  Compliant with Lipitor since last visit.  Tob dep:  Sit at 2 cig/day.  Still  working on quitting  HM:  Due for flu shot.  Eye exam due; willing to have it schedule.  Over due for MMG.  Pt says she had appt but was cancelled due to pandemic  Outpatient Encounter Medications as of 12/30/2018  Medication Sig  . Accu-Chek FastClix Lancets MISC Use as directed  . albuterol (PROVENTIL HFA;VENTOLIN HFA) 108 (90 Base) MCG/ACT inhaler Inhale 1-2 puffs into the lungs every 6 (six) hours as needed for wheezing or shortness of breath.  Marland Kitchen amLODipine (NORVASC) 10 MG tablet Take 1 tablet (10 mg total) by mouth daily.  Marland Kitchen aspirin 81 MG chewable tablet Chew 1 tablet (81 mg total) by mouth daily.  Marland Kitchen atorvastatin (LIPITOR) 40 MG tablet Take 1 tablet (40 mg total) by mouth daily.  . Blood Glucose Monitoring Suppl (ACCU-CHEK GUIDE ME) w/Device KIT 1 Device by Does not apply route daily.  . fluticasone (FLONASE) 50 MCG/ACT nasal spray Place 2 sprays into both nostrils daily. (Patient not taking: Reported on 06/25/2018)  . glimepiride (AMARYL) 2 MG tablet Take 1 tablet (2 mg total) by mouth 2 (two) times daily.  Marland Kitchen glucose blood (ACCU-CHEK GUIDE) test strip Test blood sugar once a day  . Lancets Misc. (ACCU-CHEK FASTCLIX LANCET) KIT Use as directed  . lisinopril (PRINIVIL,ZESTRIL) 30 MG tablet Take 1 tablet (30 mg total) by mouth daily.  . metFORMIN (GLUCOPHAGE) 1000 MG  tablet Take 1 tablet (1,000 mg total) by mouth 2 (two) times daily with a meal. For diabetes   No facility-administered encounter medications on file as of 12/30/2018.       Observations/Objective: No direct observation done as this was a telephone encounter.  Assessment and Plan: 1. Type 2 diabetes mellitus without complication, without long-term current use of insulin ([place]) Encourage patient to check blood sugars at least once a day especially since we do not have an A1c for the past 6 months.  I have encouraged her to come to the lab to have an A1c drawn.  She is willing to do so next Tuesday.  We will plan to give the flu  shot at the same time. -Continue current dose of metformin and Amaryl. -Encouraged to continue healthy eating habits and trying to stay active  2. Essential hypertension Reports home blood pressures were good but she has not checked in a month.  Encourage her to check blood pressure at least once a week with goal being 130/80 or lower.  She will continue amlodipine and lisinopril.  3. Tobacco dependence Advised to quit.  She declines any medication to help her get over the hump of putting down the 2 cigarettes/day.  She states that she will continue to work on it.  Less than 5 minutes spent on counseling  4. Mixed hyperlipidemia Continue atorvastatin  5. Breast cancer screening Referral submitted for mammogram  6. Need for influenza vaccination I have put her on the schedule for next Tuesday as a nurse only visit for flu shot   Follow Up Instructions: 3 mths   I discussed the assessment and treatment plan with the patient. The patient was provided an opportunity to ask questions and all were answered. The patient agreed with the plan and demonstrated an understanding of the instructions.   The patient was advised to call back or seek an in-person evaluation if the symptoms worsen or if the condition fails to improve as anticipated.  I provided 13 minutes of non-face-to-face time during this encounter.   Karle Plumber, MD

## 2019-01-07 ENCOUNTER — Other Ambulatory Visit: Payer: Self-pay

## 2019-01-07 ENCOUNTER — Ambulatory Visit: Payer: Medicaid Other | Attending: Internal Medicine

## 2019-01-07 ENCOUNTER — Ambulatory Visit (HOSPITAL_BASED_OUTPATIENT_CLINIC_OR_DEPARTMENT_OTHER): Payer: Medicaid Other | Admitting: Pharmacist

## 2019-01-07 DIAGNOSIS — Z23 Encounter for immunization: Secondary | ICD-10-CM | POA: Diagnosis not present

## 2019-01-07 DIAGNOSIS — E119 Type 2 diabetes mellitus without complications: Secondary | ICD-10-CM

## 2019-01-07 NOTE — Progress Notes (Signed)
Patient presents for vaccination against influenza per orders of Dr. Johnson. Consent given. Counseling provided. No contraindications exists. Vaccine administered without incident.   

## 2019-01-08 ENCOUNTER — Other Ambulatory Visit: Payer: Self-pay | Admitting: Internal Medicine

## 2019-01-08 LAB — HEMOGLOBIN A1C
Est. average glucose Bld gHb Est-mCnc: 194 mg/dL
Hgb A1c MFr Bld: 8.4 % — ABNORMAL HIGH (ref 4.8–5.6)

## 2019-01-08 MED ORDER — DAPAGLIFLOZIN PROPANEDIOL 5 MG PO TABS: 5.0000 mg | ORAL_TABLET | Freq: Every day | ORAL | 3 refills | Status: DC

## 2019-01-10 ENCOUNTER — Telehealth: Payer: Self-pay | Admitting: Internal Medicine

## 2019-01-10 NOTE — Telephone Encounter (Signed)
-----   Message from Jackelyn Knife, [place] sent at 01/02/2019  9:01 AM EDT ----- Please contact pt and schedule a 3 month f/u (in person morning)

## 2019-01-10 NOTE — Telephone Encounter (Signed)
Attempted to call patient to schedule follow up, vm is not set up

## 2019-01-13 ENCOUNTER — Telehealth: Payer: Self-pay

## 2019-01-13 NOTE — Telephone Encounter (Signed)
Contacted pt to go over lab results pt is aware and doesn't have any questions or concerns 

## 2019-01-24 ENCOUNTER — Other Ambulatory Visit: Payer: Self-pay | Admitting: Internal Medicine

## 2019-01-24 DIAGNOSIS — I10 Essential (primary) hypertension: Secondary | ICD-10-CM

## 2019-02-19 ENCOUNTER — Encounter: Payer: Self-pay | Admitting: Internal Medicine

## 2019-02-19 NOTE — Progress Notes (Signed)
Received results of c-scope done 02/13/2019 by Dr. Benson Norway.  8  2-5 mm sessile polyps removed. Await path results.  Will need repeat in 3 yrs

## 2019-02-21 ENCOUNTER — Other Ambulatory Visit: Payer: Self-pay | Admitting: Internal Medicine

## 2019-02-21 ENCOUNTER — Other Ambulatory Visit: Payer: Self-pay

## 2019-02-21 ENCOUNTER — Ambulatory Visit
Admission: RE | Admit: 2019-02-21 | Discharge: 2019-02-21 | Disposition: A | Discharge status: Home / Self Care | Payer: Medicaid Other | Source: Ambulatory Visit | Attending: Internal Medicine | Admitting: Internal Medicine

## 2019-02-21 DIAGNOSIS — Z1239 Encounter for other screening for malignant neoplasm of breast: Secondary | ICD-10-CM

## 2019-02-25 ENCOUNTER — Telehealth: Payer: Self-pay

## 2019-02-25 NOTE — Telephone Encounter (Signed)
Contacted pt to go over mm results pt is aware and doesn't have any questions or concerns  

## 2019-03-20 ENCOUNTER — Other Ambulatory Visit: Payer: Self-pay | Admitting: Internal Medicine

## 2019-03-20 DIAGNOSIS — I10 Essential (primary) hypertension: Secondary | ICD-10-CM

## 2019-03-28 ENCOUNTER — Other Ambulatory Visit: Payer: Self-pay | Admitting: Internal Medicine

## 2019-03-28 DIAGNOSIS — I10 Essential (primary) hypertension: Secondary | ICD-10-CM

## 2019-03-28 DIAGNOSIS — E1165 Type 2 diabetes mellitus with hyperglycemia: Secondary | ICD-10-CM

## 2019-04-07 ENCOUNTER — Ambulatory Visit: Payer: Medicare Other | Attending: Internal Medicine | Admitting: Internal Medicine

## 2019-04-07 ENCOUNTER — Encounter: Payer: Self-pay | Admitting: Internal Medicine

## 2019-04-07 DIAGNOSIS — E782 Mixed hyperlipidemia: Secondary | ICD-10-CM | POA: Diagnosis not present

## 2019-04-07 DIAGNOSIS — E119 Type 2 diabetes mellitus without complications: Secondary | ICD-10-CM

## 2019-04-07 DIAGNOSIS — I10 Essential (primary) hypertension: Secondary | ICD-10-CM

## 2019-04-07 MED ORDER — ATORVASTATIN CALCIUM 40 MG PO TABS: 40.0000 mg | ORAL_TABLET | Freq: Every day | ORAL | 3 refills | Status: DC

## 2019-04-07 MED ORDER — DAPAGLIFLOZIN PROPANEDIOL 5 MG PO TABS: 5.0000 mg | ORAL_TABLET | Freq: Every day | ORAL | 6 refills | Status: DC

## 2019-04-07 MED ORDER — LISINOPRIL 30 MG PO TABS: 30.0000 mg | ORAL_TABLET | Freq: Every day | ORAL | 2 refills | Status: DC

## 2019-04-07 MED ORDER — METFORMIN HCL 1000 MG PO TABS: ORAL_TABLET | ORAL | 2 refills | Status: DC

## 2019-04-07 NOTE — Progress Notes (Signed)
Virtual Visit via Telephone Note Due to current restrictions/limitations of in-office visits due to the COVID-19 pandemic, this scheduled clinical appointment was converted to a telehealth visit  I connected with Stephanie Neal on 04/07/19 at 11:10 AM EST by telephone and verified that I am speaking with the correct person using two identifiers. I am in my office.  The patient is at home.  Only the patient and myself participated in this encounter.  I discussed the limitations, risks, security and privacy concerns of performing an evaluation and management service by telephone and the availability of in person appointments. I also discussed with the patient that there may be a patient responsible charge related to this service. The patient expressed understanding and agreed to proceed.   History of Present Illness: Hx of DM, HTN, tob, obesity, HL.  Patient last evaluated 08/2018.  Purpose of today's visit is for chronic disease management.  Last eval 12/30/2018.  DIABETES TYPE 2 Last A1C:   Results for orders placed or performed in visit on 01/07/19  Hemoglobin A1c  Result Value Ref Range   Hgb A1c MFr Bld 8.4 (H) 4.8 - 5.6 %   Est. average glucose Bld gHb Est-mCnc 194 mg/dL   Lab Results  Component Value Date   HGBA1C 8.4 (H) 01/07/2019   Med Adherence:  [x]  Yes - Farxiga added 12/2018 after A1C was 8.4.  She did get the 01/2019 and taking it.     Compliant with Metformin and Amaryl Medication side effects:  []  Yes    [x]  No Home Monitoring?  []  Yes    [x]  No - states she has not unpacked fully from move this summer Home glucose results range: Diet Adherence: [x]  Yes drinking mainly water, no juices, occasionally drinks diet soda. Eat wheat bread    Exercise: [x]  Yes  - getting out to walk with daughter 2 x a wk Hypoglycemic episodes?: []  Yes    []  No Numbness of the feet? []  Yes    [x]  No Retinopathy hx? []  Yes    []  No Last eye exam: over due for eye exam.  Pt states she  was called but was busy with her move. Comments:   HYPERTENSION Currently taking: see medication list Med Adherence: [x]  Yes    []  No Medication side effects: []  Yes    [x]  No Adherence with salt restriction: [x]  Yes    []  No Home Monitoring?: []  Yes    [x]  No Monitoring Frequency: []  Yes    []  No Home BP results range: []  Yes    []  No SOB? []  Yes    [x]  No Chest Pain?: []  Yes    [x]  No Leg swelling?: []  Yes    [x]  No Headaches?: []  Yes    [x]  No Dizziness? []  Yes    [x]  No Comments:   HL:  Compliant with Lipitor   Observations/Objective: Results for orders placed or performed in visit on 01/07/19  Hemoglobin A1c  Result Value Ref Range   Hgb A1c MFr Bld 8.4 (H) 4.8 - 5.6 %   Est. average glucose Bld gHb Est-mCnc 194 mg/dL     Assessment and Plan: 1. Type 2 diabetes mellitus without complication, without long-term current use of insulin (HCC) Not at goal.  Informed her that the goal for the A1c is less than 7.  Encouraged her to continue healthy eating habits.  Encouraged her to increase exercise to 3 times a week for 30 minutes. She will continue Farxiga, Metformin  and Amaryl.  Strongly encouraged her to try to check her blood sugars at least once a day before meal with goal being 90-130 - dapagliflozin propanediol (FARXIGA) 5 MG TABS tablet; Take 5 mg by mouth daily before breakfast.  Dispense: 30 tablet; Refill: 6 - metFORMIN (GLUCOPHAGE) 1000 MG tablet; TAKE 1 TABLET BY MOUTH 2 (TWO) TIMES DAILY WITH A MEAL. FOR DIABETES  Dispense: 180 tablet; Refill: 2 - Ambulatory referral to Ophthalmology - Hemoglobin A1c; Future  2. Essential hypertension Encouraged her to check her blood pressure.  She does have a device.  She will continue her current medications and low-salt diet - lisinopril (ZESTRIL) 30 MG tablet; Take 1 tablet (30 mg total) by mouth daily.  Dispense: 90 tablet; Refill: 2  3. Mixed hyperlipidemia - atorvastatin (LIPITOR) 40 MG tablet; Take 1 tablet (40 mg  total) by mouth daily.  Dispense: 90 tablet; Refill: 3   Follow Up Instructions: 6 wks for PAP   I discussed the assessment and treatment plan with the patient. The patient was provided an opportunity to ask questions and all were answered. The patient agreed with the plan and demonstrated an understanding of the instructions.   The patient was advised to call back or seek an in-person evaluation if the symptoms worsen or if the condition fails to improve as anticipated.  I provided 11 minutes of non-face-to-face time during this encounter.   Karle Plumber, MD

## 2019-05-19 ENCOUNTER — Other Ambulatory Visit (HOSPITAL_COMMUNITY)
Admission: RE | Admit: 2019-05-19 | Discharge: 2019-05-19 | Disposition: A | Discharge status: Home / Self Care | Payer: Medicare Other | Source: Ambulatory Visit | Attending: Internal Medicine | Admitting: Internal Medicine

## 2019-05-19 ENCOUNTER — Encounter: Payer: Self-pay | Admitting: Internal Medicine

## 2019-05-19 ENCOUNTER — Other Ambulatory Visit: Payer: Self-pay

## 2019-05-19 ENCOUNTER — Ambulatory Visit (HOSPITAL_BASED_OUTPATIENT_CLINIC_OR_DEPARTMENT_OTHER): Payer: Medicare Other | Admitting: Internal Medicine

## 2019-05-19 VITALS — BP 130/70 | HR 91 | Temp 97.9°F | Resp 16 | Wt 196.4 lb

## 2019-05-19 DIAGNOSIS — Z124 Encounter for screening for malignant neoplasm of cervix: Secondary | ICD-10-CM | POA: Insufficient documentation

## 2019-05-19 DIAGNOSIS — I10 Essential (primary) hypertension: Secondary | ICD-10-CM | POA: Diagnosis not present

## 2019-05-19 DIAGNOSIS — E119 Type 2 diabetes mellitus without complications: Secondary | ICD-10-CM

## 2019-05-19 DIAGNOSIS — Z1151 Encounter for screening for human papillomavirus (HPV): Secondary | ICD-10-CM | POA: Diagnosis not present

## 2019-05-19 DIAGNOSIS — Z7189 Other specified counseling: Secondary | ICD-10-CM
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
## 2019-05-20 ENCOUNTER — Other Ambulatory Visit: Payer: Self-pay | Admitting: Internal Medicine

## 2019-05-20 DIAGNOSIS — E119 Type 2 diabetes mellitus without complications: Secondary | ICD-10-CM

## 2019-05-20 LAB — CBC
Hematocrit: 38.8 % (ref 34.0–46.6)
Hemoglobin: 13.4 g/dL (ref 11.1–15.9)
MCH: 27.8 pg (ref 26.6–33.0)
MCHC: 34.5 g/dL (ref 31.5–35.7)
MCV: 81 fL (ref 79–97)
Platelets: 228 10*3/uL (ref 150–450)
RBC: 4.82 x10E6/uL (ref 3.77–5.28)
RDW: 13.9 % (ref 11.7–15.4)
WBC: 5.8 10*3/uL (ref 3.4–10.8)

## 2019-05-20 LAB — COMPREHENSIVE METABOLIC PANEL
ALT: 7 IU/L (ref 0–32)
AST: 11 IU/L (ref 0–40)
Albumin/Globulin Ratio: 2 (ref 1.2–2.2)
Albumin: 4.1 g/dL (ref 3.8–4.8)
Alkaline Phosphatase: 64 IU/L (ref 39–117)
BUN/Creatinine Ratio: 13 (ref 12–28)
BUN: 10 mg/dL (ref 8–27)
Bilirubin Total: <0.2 mg/dL (ref 0.0–1.2)
CO2: 23 mmol/L (ref 20–29)
Calcium: 9.2 mg/dL (ref 8.7–10.3)
Chloride: 103 mmol/L (ref 96–106)
Creatinine, Ser: 0.79 mg/dL (ref 0.57–1.00)
GFR calc Af Amer: 91 mL/min/{1.73_m2} (ref >59)
GFR calc non Af Amer: 79 mL/min/{1.73_m2} (ref >59)
Globulin, Total: 2.1 g/dL (ref 1.5–4.5)
Glucose: 163 mg/dL — ABNORMAL HIGH (ref 65–99)
Potassium: 4.2 mmol/L (ref 3.5–5.2)
Sodium: 139 mmol/L (ref 134–144)
Total Protein: 6.2 g/dL (ref 6.0–8.5)

## 2019-05-20 LAB — LIPID PANEL
Chol/HDL Ratio: 4 ratio (ref 0.0–4.4)
Cholesterol, Total: 162 mg/dL (ref 100–199)
HDL: 41 mg/dL (ref >39)
LDL Chol Calc (NIH): 97 mg/dL (ref 0–99)
Triglycerides: 133 mg/dL (ref 0–149)
VLDL Cholesterol Cal: 24 mg/dL (ref 5–40)

## 2019-05-20 LAB — HEMOGLOBIN A1C
Est. average glucose Bld gHb Est-mCnc: 189 mg/dL
Hgb A1c MFr Bld: 8.2 % — ABNORMAL HIGH (ref 4.8–5.6)

## 2019-05-20 MED ORDER — FARXIGA 10 MG PO TABS: 10.0000 mg | ORAL_TABLET | Freq: Every day | ORAL | 6 refills | Status: DC

## 2019-05-20 NOTE — Progress Notes (Signed)
Let pt know that her blood count is nl meaning no anemia.  Kidney and LTFs nl.  LDL cholesterol is 97 with goal being less than 70.  Take the Atorvastatin 40 mg daily as prescribed to help get cholesterol at goal.  A1C is 8.2 with goal being less than 7.  I recommend increasing Farxiga (Dapagliflozin) to 10 mg daily.  I have sent an updated rxn to her pharmacy.  Try to move more ie exercise.

## 2019-05-21 LAB — CYTOLOGY - PAP
Comment: NEGATIVE
Diagnosis: NEGATIVE
High risk HPV: NEGATIVE

## 2019-05-27 ENCOUNTER — Telehealth: Payer: Self-pay

## 2019-05-27 NOTE — Telephone Encounter (Signed)
Contacted pt to go over pap results pt didn't answer and was unable to lvm due to vm not being set up

## 2019-05-27 NOTE — Telephone Encounter (Signed)
Contacted pt to go over lab results pt didn't answer and was unable to lvm due to vm not being set up 

## 2019-07-06 ENCOUNTER — Other Ambulatory Visit: Payer: Self-pay | Admitting: Internal Medicine

## 2019-07-06 DIAGNOSIS — E1165 Type 2 diabetes mellitus with hyperglycemia: Secondary | ICD-10-CM

## 2019-08-08 ENCOUNTER — Other Ambulatory Visit: Payer: Self-pay | Admitting: Internal Medicine

## 2019-08-08 DIAGNOSIS — I10 Essential (primary) hypertension: Secondary | ICD-10-CM

## 2019-08-25 ENCOUNTER — Ambulatory Visit: Payer: Medicare Other | Admitting: Internal Medicine

## 2019-10-20 ENCOUNTER — Ambulatory Visit: Payer: Medicare Other | Attending: Internal Medicine

## 2019-10-20 DIAGNOSIS — Z23 Encounter for immunization: Secondary | ICD-10-CM

## 2019-10-20 NOTE — Progress Notes (Signed)
   Covid-19 Vaccination Clinic  Name:  Stephanie Neal    MRN: [id number] DOB: 09/07/1955  10/20/2019  [name] was observed post Covid-19 immunization for 15 minutes without incident. She was provided with Vaccine Information Sheet and instruction to access the V-Safe system.   [name] was instructed to call 911 with any severe reactions post vaccine: Marland Kitchen Difficulty breathing  . Swelling of face and throat  . A fast heartbeat  . A bad rash all over body  . Dizziness and weakness   Immunizations Administered    Name Date Dose VIS Date Route   Pfizer COVID-19 Vaccine 10/20/2019 12:23 PM 0.3 mL 06/25/2018 Intramuscular   Manufacturer: [employer], [employer]   Lot: [id number]   NDC: [address]-5

## 2019-10-26 ENCOUNTER — Other Ambulatory Visit: Payer: Self-pay | Admitting: Internal Medicine

## 2019-10-26 DIAGNOSIS — E1165 Type 2 diabetes mellitus with hyperglycemia: Secondary | ICD-10-CM

## 2019-10-26 DIAGNOSIS — I10 Essential (primary) hypertension: Secondary | ICD-10-CM

## 2019-11-17 ENCOUNTER — Ambulatory Visit: Payer: Medicare Other | Attending: Internal Medicine

## 2019-11-17 DIAGNOSIS — Z23 Encounter for immunization: Secondary | ICD-10-CM

## 2019-11-17 NOTE — Progress Notes (Signed)
   Covid-19 Vaccination Clinic  Name:  Stephanie Neal    MRN: [id number] DOB: 1955/12/06  11/17/2019  Ms. Poss was observed post Covid-19 immunization for 15 minutes without incident. She was provided with Vaccine Information Sheet and instruction to access the V-Safe system.   [name] was instructed to call 911 with any severe reactions post vaccine: Marland Kitchen Difficulty breathing  . Swelling of face and throat  . A fast heartbeat  . A bad rash all over body  . Dizziness and weakness   Immunizations Administered    Name Date Dose VIS Date Route   Pfizer COVID-19 Vaccine 11/17/2019 12:28 PM 0.3 mL 06/25/2018 Intramuscular   Manufacturer: [employer], [employer]   Lot: [id number]   NDC: [address]-5

## 2019-11-18 ENCOUNTER — Other Ambulatory Visit: Payer: Self-pay

## 2019-11-18 ENCOUNTER — Ambulatory Visit: Payer: Medicare Other | Attending: Internal Medicine | Admitting: Internal Medicine

## 2019-11-18 ENCOUNTER — Encounter: Payer: Self-pay | Admitting: Internal Medicine

## 2019-11-18 VITALS — BP 131/77 | HR 95 | Resp 16 | Wt 193.2 lb

## 2019-11-18 DIAGNOSIS — E669 Obesity, unspecified: Secondary | ICD-10-CM | POA: Diagnosis not present

## 2019-11-18 DIAGNOSIS — M25561 Pain in right knee: Secondary | ICD-10-CM | POA: Diagnosis not present

## 2019-11-18 DIAGNOSIS — E1169 Type 2 diabetes mellitus with other specified complication: Secondary | ICD-10-CM

## 2019-11-18 DIAGNOSIS — Z6826 Body mass index (BMI) 26.0-26.9, adult: Secondary | ICD-10-CM | POA: Diagnosis not present

## 2019-11-18 DIAGNOSIS — Z8249 Family history of ischemic heart disease and other diseases of the circulatory system: Secondary | ICD-10-CM | POA: Diagnosis not present

## 2019-11-18 DIAGNOSIS — I10 Essential (primary) hypertension: Secondary | ICD-10-CM

## 2019-11-18 DIAGNOSIS — F17210 Nicotine dependence, cigarettes, uncomplicated: Secondary | ICD-10-CM | POA: Diagnosis not present

## 2019-11-18 DIAGNOSIS — G8929 Other chronic pain: Secondary | ICD-10-CM

## 2019-11-18 DIAGNOSIS — E785 Hyperlipidemia, unspecified: Secondary | ICD-10-CM

## 2019-11-18 DIAGNOSIS — E782 Mixed hyperlipidemia: Secondary | ICD-10-CM | POA: Diagnosis not present

## 2019-11-18 DIAGNOSIS — E119 Type 2 diabetes mellitus without complications: Secondary | ICD-10-CM | POA: Diagnosis not present

## 2019-11-18 DIAGNOSIS — F17200 Nicotine dependence, unspecified, uncomplicated: Secondary | ICD-10-CM | POA: Diagnosis not present

## 2019-11-18 DIAGNOSIS — Z880 Allergy status to penicillin: Secondary | ICD-10-CM | POA: Diagnosis not present

## 2019-11-18 DIAGNOSIS — Z79899 Other long term (current) drug therapy: Secondary | ICD-10-CM | POA: Insufficient documentation

## 2019-11-18 DIAGNOSIS — Z833 Family history of diabetes mellitus: Secondary | ICD-10-CM | POA: Insufficient documentation

## 2019-11-18 DIAGNOSIS — Z7982 Long term (current) use of aspirin: Secondary | ICD-10-CM | POA: Insufficient documentation

## 2019-11-18 DIAGNOSIS — Z7984 Long term (current) use of oral hypoglycemic drugs: Secondary | ICD-10-CM | POA: Diagnosis not present

## 2019-11-18 DIAGNOSIS — E663 Overweight: Secondary | ICD-10-CM

## 2019-11-18 LAB — POCT GLYCOSYLATED HEMOGLOBIN (HGB A1C): HbA1c, POC (controlled diabetic range): 8.5 % — AB (ref 0.0–7.0)

## 2019-11-18 LAB — GLUCOSE, POCT (MANUAL RESULT ENTRY): POC Glucose: 154 mg/dl — AB (ref 70–99)

## 2019-11-18 MED ORDER — ACCU-CHEK FASTCLIX LANCET KIT: PACK | 6 refills | Status: DC

## 2019-11-18 MED ORDER — DICLOFENAC SODIUM 1 % EX GEL: 2.0000 g | Freq: Four times a day (QID) | CUTANEOUS | 1 refills | Status: DC

## 2019-11-18 MED ORDER — ACCU-CHEK GUIDE ME W/DEVICE KIT: PACK | 0 refills | Status: DC

## 2019-11-18 MED ORDER — ACCU-CHEK GUIDE VI STRP: ORAL_STRIP | 12 refills | Status: DC

## 2019-11-18 MED ORDER — GLIMEPIRIDE 2 MG PO TABS: 2.0000 mg | ORAL_TABLET | Freq: Two times a day (BID) | ORAL | 6 refills | Status: DC

## 2019-11-18 NOTE — Patient Instructions (Signed)
We have increased the glimepiride to 2 mg twice a day.  I have submitted an order for you to get an x-ray of your right knee.  You can go to the radiology department at [facility] to have this done at any time.  I have sent the prescription to your pharmacy for diabetic testing supplies.  Please remember to call and schedule your diabetic eye exam.

## 2019-11-18 NOTE — Progress Notes (Signed)
Patient ID: Stephanie Neal, female    DOB: [birth year]  MRN: [id number]  CC: Diabetes and Hypertension   Subjective: Stephanie Neal is a 64 y.o. female who presents for chronic ds management. Her concerns today include:  Hx of DM, HTN, tob, obesity, HL.  DIABETES TYPE 2 Last A1C:   Results for orders placed or performed in visit on 11/18/19  POCT glucose (manual entry)  Result Value Ref Range   POC Glucose 154 (A) 70 - 99 mg/dl  POCT glycosylated hemoglobin (Hb A1C)  Result Value Ref Range   Hemoglobin A1C     HbA1c POC (<> result, manual entry)     HbA1c, POC (prediabetic range)     HbA1c, POC (controlled diabetic range) 8.5 (A) 0.0 - 7.0 %    Med Adherence:  '[x]'$  Yes   -she is on Amaryl 2 mg daily, Metformin and Farxiga Medication side effects:  '[]'$  Yes    '[x]'$  No Home Monitoring?  '[]'$  Yes    '[x]'$  No.  Needs new testing supplies. Home glucose results range: Diet Adherence: '[x]'$  Yes.  Use zero calorie sweetener, eating a lot of veggies.  Eats Honey Wheat Exercise: '[x]'$  Yes.  She walks around her building few times a wk Hypoglycemic episodes?: '[]'$  Yes    '[x]'$  No Numbness of the feet? '[]'$  Yes    '[x]'$  No Retinopathy hx? '[]'$  Yes    '[x]'$  No Last eye exam: over due for eye exam.  Plans to call and schedule herself this week. Comments:   HYPERTENSION Currently taking: see medication list Med Adherence: '[x]'$  Yes  - on Norvasc and Lisinopril Medication side effects: '[]'$  Yes    '[x]'$  No Adherence with salt restriction: '[x]'$  Yes    '[]'$  No Home Monitoring?: '[]'$  Yes    '[x]'$  No Monitoring Frequency: '[]'$  Yes    '[]'$  No Home BP results range: '[]'$  Yes    '[]'$  No SOB? '[]'$  Yes    '[x]'$  No Chest Pain?: '[]'$  Yes    '[x]'$  No Leg swelling?: '[]'$  Yes    '[x]'$  No Headaches?: '[]'$  Yes    '[x]'$  No Dizziness? '[]'$  Yes    '[x]'$  No Comments:   HL: Her LDL was 97 when last checked 6 months ago.  Taking atorvastatin as prescribed.  RT knees gives her pain and swelling sometimes. No flare this yr.  Uses Voltaren Gel.  Wears knee  brace sometimes when she going out like to the grocery store.  Voltaren gel helps.  Request a refill  Tobacco dependence: She states that she is almost quit. Patient Active Problem List   Diagnosis Date Noted  . Mixed hyperlipidemia 06/25/2018  . Over weight 06/25/2018  . Stress due to family tension 11/13/2017  . DM type 2 (diabetes mellitus, type 2) ([place]) 03/05/2014  . HYPERCHOLESTEROLEMIA 03/12/2007  . OBESITY 03/12/2007  . TOBACCO ABUSE 03/12/2007  . HTN (hypertension) 03/12/2007  . ALLERGIC RHINITIS, SEASONAL 03/12/2007     Current Outpatient Medications on File Prior to Visit  Medication Sig Dispense Refill  . albuterol (PROVENTIL HFA;VENTOLIN HFA) 108 (90 Base) MCG/ACT inhaler Inhale 1-2 puffs into the lungs every 6 (six) hours as needed for wheezing or shortness of breath. 1 Inhaler 0  . amLODipine (NORVASC) 10 MG tablet TAKE 1 TABLET BY MOUTH EVERY DAY 30 tablet 0  . atorvastatin (LIPITOR) 40 MG tablet Take 1 tablet (40 mg total) by mouth daily. 90 tablet 3  . [facility] ASPIRIN ADULT LOW DOSE 81 MG chewable tablet  CHEW 1 TABLET (81 MG TOTAL) BY MOUTH DAILY. 100 tablet 2  . dapagliflozin propanediol (FARXIGA) 10 MG TABS tablet Take 10 mg by mouth daily before breakfast. 30 tablet 6  . fluticasone (FLONASE) 50 MCG/ACT nasal spray Place 2 sprays into both nostrils daily. (Patient not taking: Reported on 06/25/2018) 16 g 6  . lisinopril (ZESTRIL) 30 MG tablet Take 1 tablet (30 mg total) by mouth daily. 90 tablet 2  . metFORMIN (GLUCOPHAGE) 1000 MG tablet TAKE 1 TABLET BY MOUTH 2 (TWO) TIMES DAILY WITH A MEAL. FOR DIABETES 180 tablet 2   No current facility-administered medications on file prior to visit.    Allergies  Allergen Reactions  . Penicillins Other (See Comments)    "blanked out" Has patient had a PCN reaction causing immediate rash, facial/tongue/throat swelling, SOB or lightheadedness with hypotension: No Has patient had a PCN reaction causing severe rash involving mucus  membranes or skin necrosis: No Has patient had a PCN reaction that required hospitalization No Has patient had a PCN reaction occurring within the last 10 years: No If all of the above answers are "NO", then may proceed with Cephalosporin use.     Social History   Socioeconomic History  . Marital status: Single    Spouse name: Not on file  . Number of children: Not on file  . Years of education: Not on file  . Highest education level: Not on file  Occupational History  . Not on file  Tobacco Use  . Smoking status: Current Every Day Smoker    Packs/day: 0.25    Types: Cigarettes  . Smokeless tobacco: Never Used  Substance and Sexual Activity  . Alcohol use: No  . Drug use: No  . Sexual activity: Not on file  Other Topics Concern  . Not on file  Social History Narrative  . Not on file   Social Determinants of Health   Financial Resource Strain:   . Difficulty of Paying Living Expenses:   Food Insecurity:   . Worried About Charity fundraiser in the Last Year:   . Arboriculturist in the Last Year:   Transportation Needs:   . Film/video editor (Medical):   Marland Kitchen Lack of Transportation (Non-Medical):   Physical Activity:   . Days of Exercise per Week:   . Minutes of Exercise per Session:   Stress:   . Feeling of Stress :   Social Connections:   . Frequency of Communication with Friends and Family:   . Frequency of Social Gatherings with Friends and Family:   . Attends Religious Services:   . Active Member of Clubs or Organizations:   . Attends Archivist Meetings:   Marland Kitchen Marital Status:   Intimate Partner Violence:   . Fear of Current or Ex-Partner:   . Emotionally Abused:   Marland Kitchen Physically Abused:   . Sexually Abused:     Family History  Problem Relation Age of Onset  . Hypertension Mother   . Diabetes Mother   . COPD Father   . Heart disease Father   . Diabetes Father     No past surgical history on file.  ROS: Review of Systems Negative  except as stated above  PHYSICAL EXAM: BP 131/77   Pulse 95   Resp 16   Wt 193 lb 3.2 oz (87.6 kg)   SpO2 96%   BMI 26.20 kg/m   Wt Readings from Last 3 Encounters:  11/18/19 193 lb 3.2 oz (  87.6 kg)  05/19/19 196 lb 6.4 oz (89.1 kg)  06/25/18 201 lb 3.2 oz (91.3 kg)    Physical Exam  General appearance - alert, well appearing, and in no distress Mental status - normal mood, behavior, speech, dress, motor activity, and thought processes Neck - supple, no significant adenopathy Chest - clear to auscultation, no wheezes, rales or rhonchi, symmetric air entry Heart - normal rate, regular rhythm, normal S1, S2, no murmurs, rubs, clicks or gallops Musculoskeletal -RT knee: Mild enlargement of the joint especially laterally compared to the left knee.  Mild tenderness along the lateral joint line.  No erythema.  Mild crepitus on passive range of motion.   Extremities - peripheral pulses normal, no pedal edema, no clubbing or cyanosis Diabetic Foot Exam - Simple   Simple Foot Form Visual Inspection See comments: Yes Sensation Testing Intact to touch and monofilament testing bilaterally: Yes Pulse Check Posterior Tibialis and Dorsalis pulse intact bilaterally: Yes Comments Noninflamed bunions of both big toes.      CMP Latest Ref Rng & Units 05/19/2019 06/25/2018 05/24/2017  Glucose 65 - 99 mg/dL 163(H) 83 128(H)  BUN 8 - 27 mg/dL '10 11 9  '$ Creatinine 0.57 - 1.00 mg/dL 0.79 0.75 0.94  Sodium 134 - 144 mmol/L 139 142 142  Potassium 3.5 - 5.2 mmol/L 4.2 4.1 4.4  Chloride 96 - 106 mmol/L 103 104 102  CO2 20 - 29 mmol/L '23 23 26  '$ Calcium 8.7 - 10.3 mg/dL 9.2 9.3 9.6  Total Protein 6.0 - 8.5 g/dL 6.2 6.2 6.3  Total Bilirubin 0.0 - 1.2 mg/dL <0.2 <0.2 0.2  Alkaline Phos 39 - 117 IU/L 64 67 61  AST 0 - 40 IU/L '11 11 13  '$ ALT 0 - 32 IU/L '7 8 8   '$ Lipid Panel     Component Value Date/Time   CHOL 162 05/19/2019 1023   TRIG 133 05/19/2019 1023   HDL 41 05/19/2019 1023   CHOLHDL 4.0  05/19/2019 1023   CHOLHDL 5.6 (H) 06/09/2015 1023   VLDL 22 06/09/2015 1023   LDLCALC 97 05/19/2019 1023    CBC    Component Value Date/Time   WBC 5.8 05/19/2019 1023   WBC 5.4 09/08/2013 0859   RBC 4.82 05/19/2019 1023   RBC 4.68 09/08/2013 0859   HGB 13.4 05/19/2019 1023   HCT 38.8 05/19/2019 1023   PLT 228 05/19/2019 1023   MCV 81 05/19/2019 1023   MCH 27.8 05/19/2019 1023   MCH 27.8 09/08/2013 0859   MCHC 34.5 05/19/2019 1023   MCHC 33.9 09/08/2013 0859   RDW 13.9 05/19/2019 1023   LYMPHSABS 1.7 01/05/2011 1513   MONOABS 0.3 01/05/2011 1513   EOSABS 0.1 01/05/2011 1513   BASOSABS 0.0 01/05/2011 1513    ASSESSMENT AND PLAN: 1. Type 2 diabetes mellitus without complication, without long-term current use of insulin ([facility]) Not at goal.  Increase glipizide to 2 mg twice a day.  Continue Metformin and Farxiga.  Healthy eating habits discussed.  Encouraged her to move as much as she can. - POCT glucose (manual entry) - POCT glycosylated hemoglobin (Hb A1C) - glimepiride (AMARYL) 2 MG tablet; Take 1 tablet (2 mg total) by mouth 2 (two) times daily.  Dispense: 60 tablet; Refill: 6 - Blood Glucose Monitoring Suppl (ACCU-CHEK GUIDE ME) w/Device KIT; Use as directed  Dispense: 1 kit; Refill: 0 - glucose blood (ACCU-CHEK GUIDE) test strip; Use as instructed  Dispense: 100 each; Refill: 12 - Lancets Misc. (ACCU-CHEK FASTCLIX LANCET)  KIT; Use as directed  Dispense: 1 kit; Refill: 6  2. Essential hypertension Close to goal.  Continue current medications and low-salt diet  3. Hyperlipidemia associated with type 2 diabetes mellitus ([facility]) Continue atorvastatin  4. Tobacco dependence Commended her on how much she has cut back.  Advised to quit.  Patient tells me she is almost there.  She declines any medication to help with smoking cessation.  I have encouraged her to set a quit date.  Less than 5 minutes spent on counseling.  5. Chronic pain of right knee Most likely OA.  Encouraged  her to continue walking for exercise.  We will get baseline x-ray. - diclofenac Sodium (VOLTAREN) 1 % GEL; Apply 2 g topically 4 (four) times daily.  Dispense: 100 g; Refill: 1 - DG Knee Complete 4 Views Right; Future  6. Overweight (BMI 25.0-29.9) See #1 above     Patient was given the opportunity to ask questions.  Patient verbalized understanding of the plan and was able to repeat key elements of the plan.   Orders Placed This Encounter  Procedures  . DG Knee Complete 4 Views Right  . POCT glucose (manual entry)  . POCT glycosylated hemoglobin (Hb A1C)     Requested Prescriptions   Signed Prescriptions Disp Refills  . diclofenac Sodium (VOLTAREN) 1 % GEL 100 g 1    Sig: Apply 2 g topically 4 (four) times daily.  Marland Kitchen glimepiride (AMARYL) 2 MG tablet 60 tablet 6    Sig: Take 1 tablet (2 mg total) by mouth 2 (two) times daily.  . Blood Glucose Monitoring Suppl (ACCU-CHEK GUIDE ME) w/Device KIT 1 kit 0    Sig: Use as directed  . glucose blood (ACCU-CHEK GUIDE) test strip 100 each 12    Sig: Use as instructed  . Lancets Misc. (ACCU-CHEK FASTCLIX LANCET) KIT 1 kit 6    Sig: Use as directed    Return in about 7 weeks (around 01/06/2020) for Medicare Wellness.  Karle Plumber, MD, FACP

## 2019-11-19 ENCOUNTER — Other Ambulatory Visit: Payer: Self-pay | Admitting: Internal Medicine

## 2019-11-19 DIAGNOSIS — E119 Type 2 diabetes mellitus without complications: Secondary | ICD-10-CM

## 2019-11-19 DIAGNOSIS — I10 Essential (primary) hypertension: Secondary | ICD-10-CM

## 2019-11-25 ENCOUNTER — Ambulatory Visit: Payer: Medicare Other

## 2019-12-12 ENCOUNTER — Other Ambulatory Visit: Payer: Self-pay | Admitting: Internal Medicine

## 2019-12-12 DIAGNOSIS — I10 Essential (primary) hypertension: Secondary | ICD-10-CM

## 2020-01-06 ENCOUNTER — Other Ambulatory Visit: Payer: Self-pay | Admitting: Internal Medicine

## 2020-01-06 DIAGNOSIS — I10 Essential (primary) hypertension: Secondary | ICD-10-CM

## 2020-01-09 ENCOUNTER — Encounter: Payer: Medicare Other | Admitting: Internal Medicine

## 2020-02-03 ENCOUNTER — Other Ambulatory Visit: Payer: Self-pay | Admitting: Internal Medicine

## 2020-02-03 DIAGNOSIS — I10 Essential (primary) hypertension: Secondary | ICD-10-CM

## 2020-02-05 ENCOUNTER — Encounter: Payer: Self-pay | Admitting: Pharmacist

## 2020-02-05 ENCOUNTER — Other Ambulatory Visit: Payer: Self-pay

## 2020-02-05 ENCOUNTER — Ambulatory Visit: Payer: Medicare Other | Attending: Internal Medicine | Admitting: Pharmacist

## 2020-02-05 DIAGNOSIS — Z23 Encounter for immunization: Secondary | ICD-10-CM | POA: Diagnosis not present

## 2020-02-05 NOTE — Progress Notes (Signed)
Patient presents for vaccination against influenza per orders of Dr. Johnson. Consent given. Counseling provided. No contraindications exists. Vaccine administered without incident.  ° °Stephanie Neal, PharmD, CPP °Clinical Pharmacist °[facility] °[phone] ° °

## 2020-02-08 ENCOUNTER — Other Ambulatory Visit: Payer: Self-pay | Admitting: Internal Medicine

## 2020-02-08 DIAGNOSIS — I10 Essential (primary) hypertension: Secondary | ICD-10-CM

## 2020-02-08 NOTE — Telephone Encounter (Signed)
Requested Prescriptions  Pending Prescriptions Disp Refills  . lisinopril (ZESTRIL) 30 MG tablet [Pharmacy Med Name: LISINOPRIL 30 MG TABLET] 90 tablet 1    Sig: TAKE 1 TABLET BY MOUTH EVERY DAY     Cardiovascular:  ACE Inhibitors Failed - 02/08/2020 12:13 PM      Failed - Cr in normal range and within 180 days    Creat  Date Value Ref Range Status  04/04/2016 1.11 (H) 0.50 - 0.99 mg/dL Final    Comment:      For patients > or = 65 years of age: The upper reference limit for Creatinine is approximately 13% higher for people identified as African-American.      Creatinine, Ser  Date Value Ref Range Status  05/19/2019 0.79 0.57 - 1.00 mg/dL Final   Creatinine, Urine  Date Value Ref Range Status  04/04/2016 43 20 - 320 mg/dL Final         Failed - K in normal range and within 180 days    Potassium  Date Value Ref Range Status  05/19/2019 4.2 3.5 - 5.2 mmol/L Final         Passed - Patient is not pregnant      Passed - Last BP in normal range    BP Readings from Last 1 Encounters:  11/18/19 131/77         Passed - Valid encounter within last 6 months    Recent Outpatient Visits          3 days ago Need for influenza vaccination   [facility] [place], Cornelius Moras, RPH-CPP   2 months ago Type 2 diabetes mellitus without complication, without long-term current use of insulin (HCC)   [place] [facility] Jonah Blue B, MD   8 months ago Pap smear for cervical cancer screening   [place] [facility] Jonah Blue B, MD   10 months ago Type 2 diabetes mellitus without complication, without long-term current use of insulin [facility])   [place] [facility] Jonah Blue B, MD   1 year ago Type 2 diabetes mellitus without complication, without long-term current use of insulin [facility])   [place] [facility] Marcine Matar, MD      Future Appointments             In 1 month [place], Binnie Rail, MD [facility]

## 2020-02-24 IMAGING — MG DIGITAL SCREENING BILATERAL MAMMOGRAM WITH TOMO AND CAD
8 series · 8 of 24 positions shown · non-contrast
Comparison: Previous exam(s).

CLINICAL DATA: Screening.

EXAM:
DIGITAL SCREENING BILATERAL MAMMOGRAM WITH TOMO AND CAD

[L MLO synth-2D]
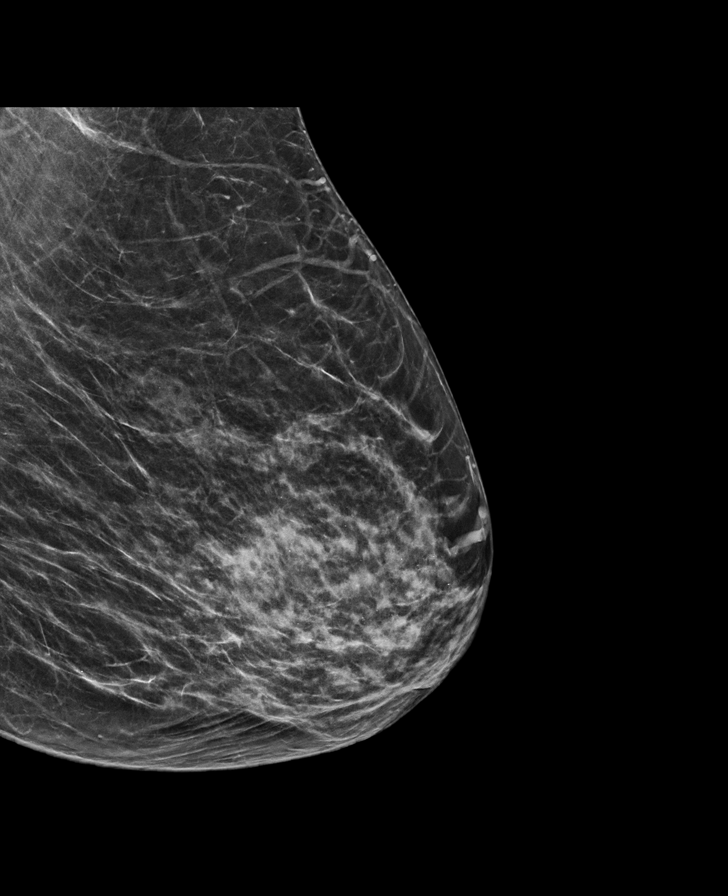

[R CC synth-2D]
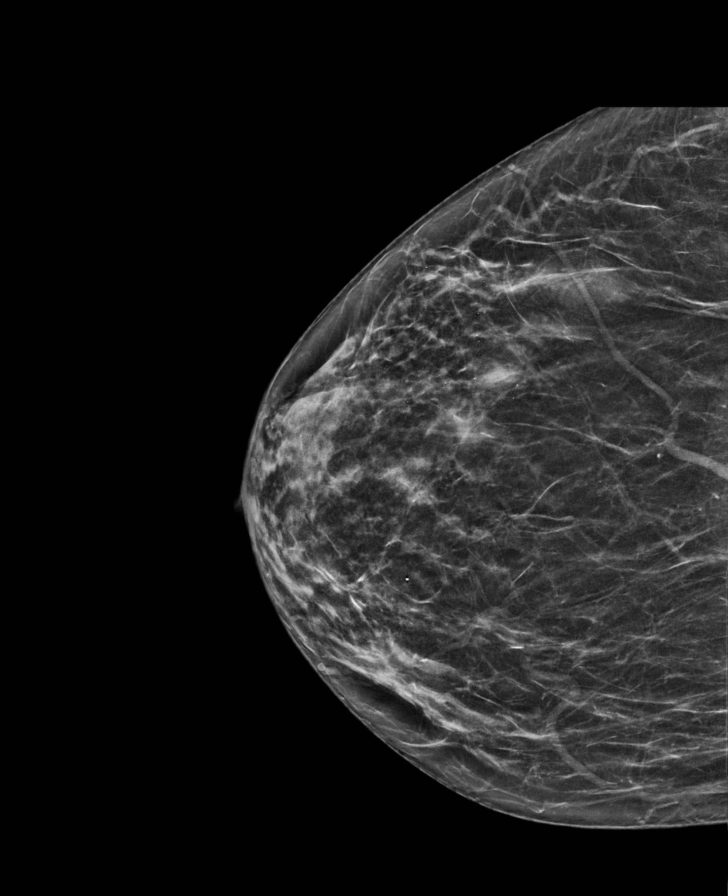

[L CC synth-2D]
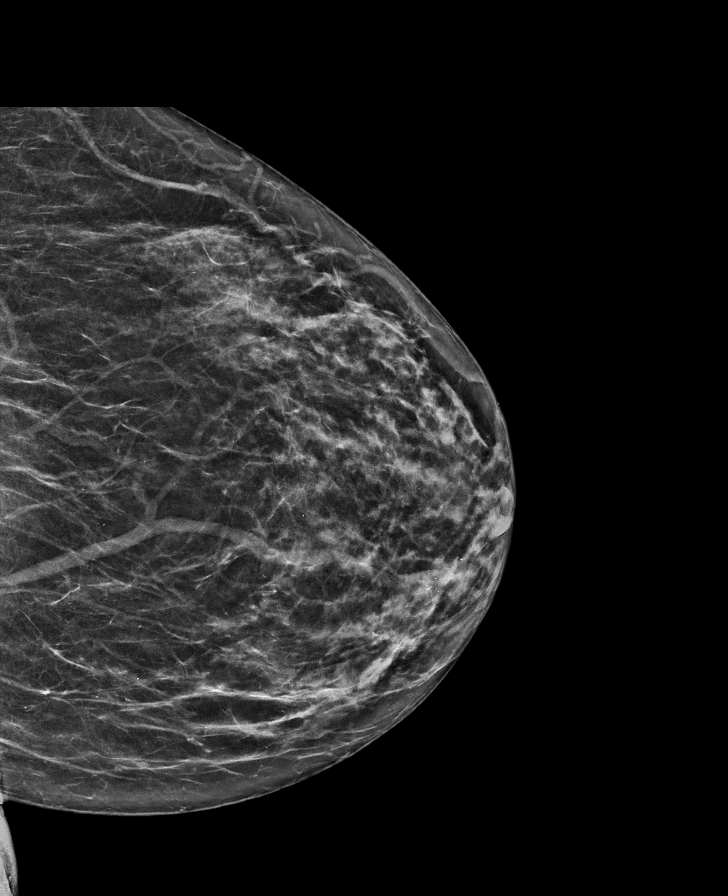

[R MLO synth-2D]
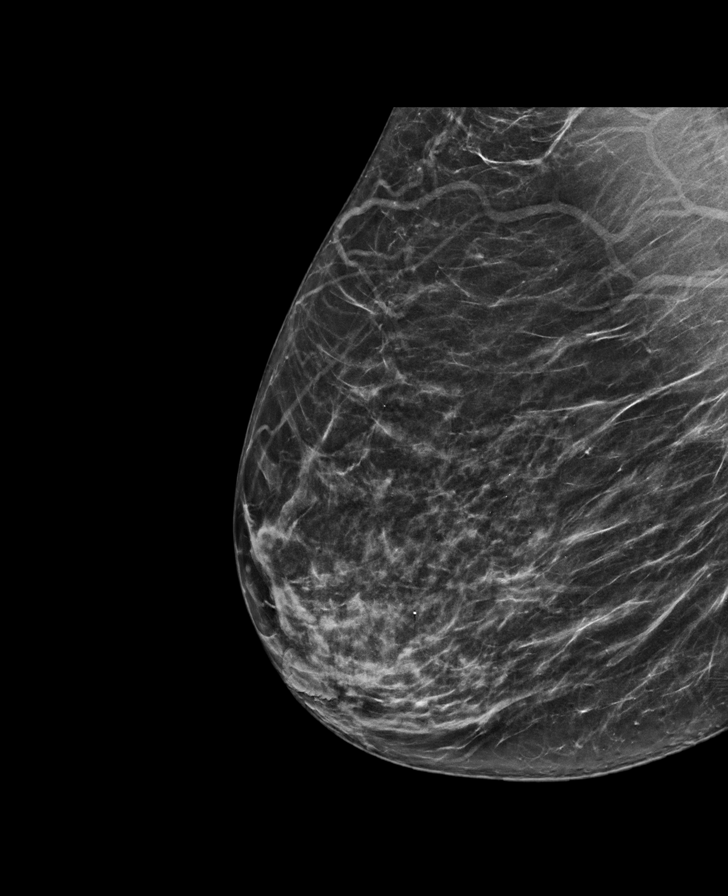

[R CC tomo · tomo slice 31/62.0]
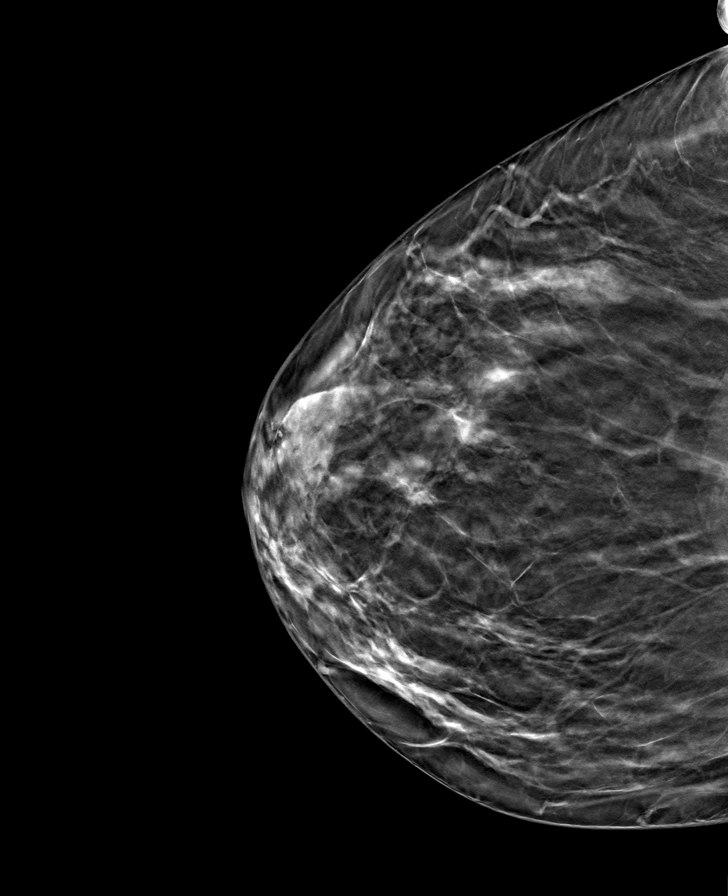

[L CC tomo · tomo slice 31/60.0]
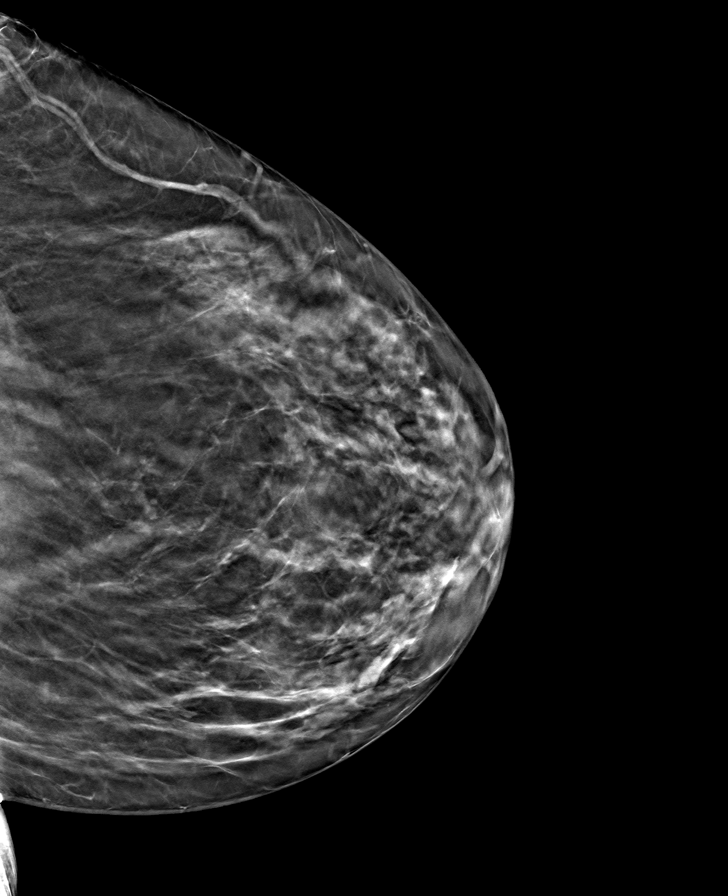

[L MLO tomo · tomo slice 31/61.0]
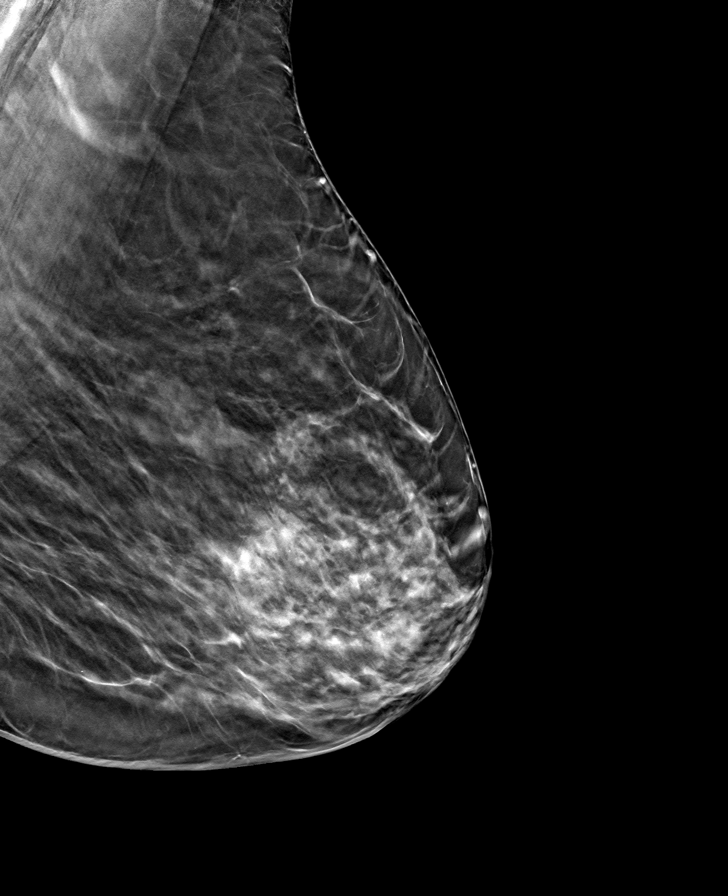

[R MLO tomo · tomo slice 34/67.0]
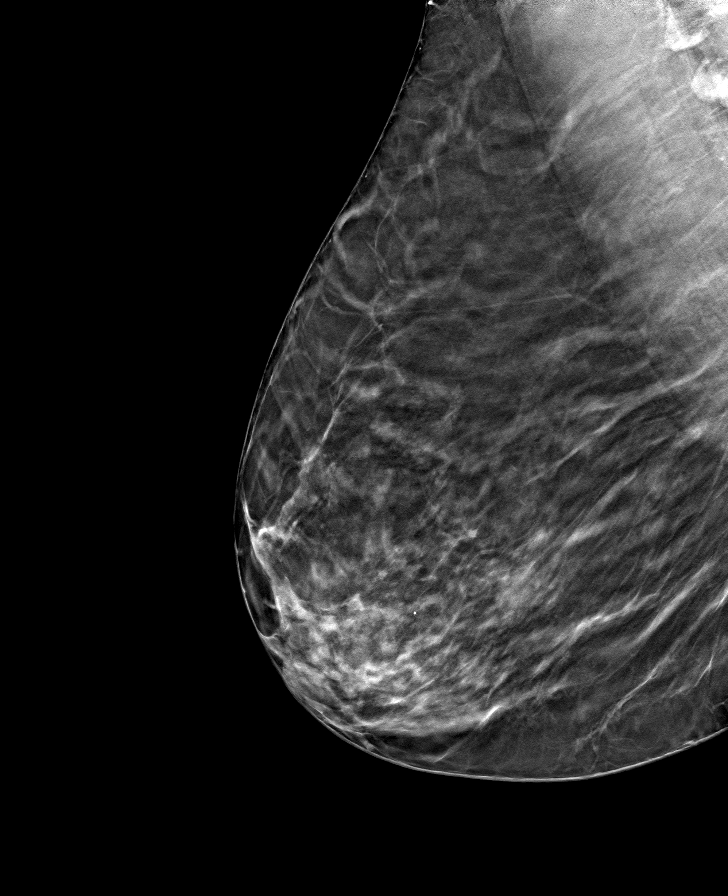

[8 of 24 positions shown; findings below may reference images not displayed]

ACR Breast Density Category b: There are scattered areas of
fibroglandular density.
FINDINGS: There are no findings suspicious for malignancy. Images were
processed with CAD.
IMPRESSION: No mammographic evidence of malignancy. A result letter of this
screening mammogram will be mailed directly to the patient.

RECOMMENDATION:
Screening mammogram in one year. (Code:[id number])

BI-RADS CATEGORY  1: Negative.

## 2020-03-04 ENCOUNTER — Other Ambulatory Visit: Payer: Self-pay | Admitting: Internal Medicine

## 2020-03-04 DIAGNOSIS — Z1231 Encounter for screening mammogram for malignant neoplasm of breast: Secondary | ICD-10-CM

## 2020-03-11 ENCOUNTER — Ambulatory Visit: Payer: Medicare Other | Admitting: Internal Medicine

## 2020-04-02 ENCOUNTER — Telehealth: Payer: Self-pay

## 2020-04-02 NOTE — Telephone Encounter (Signed)
Please schedule a F /u vissit in person as per PCP request

## 2020-04-02 NOTE — Telephone Encounter (Signed)
-----   Message from Marcine Matar, MD sent at 04/01/2020  9:21 PM EST ----- Regarding: Due for f/u visit.  please give appt in person

## 2020-04-15 ENCOUNTER — Other Ambulatory Visit: Payer: Self-pay

## 2020-04-15 ENCOUNTER — Ambulatory Visit
Admission: RE | Admit: 2020-04-15 | Discharge: 2020-04-15 | Disposition: A | Discharge status: Home / Self Care | Payer: Medicare Other | Source: Ambulatory Visit | Attending: Internal Medicine | Admitting: Internal Medicine

## 2020-04-15 DIAGNOSIS — Z1231 Encounter for screening mammogram for malignant neoplasm of breast: Secondary | ICD-10-CM

## 2020-04-21 ENCOUNTER — Telehealth: Payer: Self-pay

## 2020-04-21 NOTE — Telephone Encounter (Signed)
Contacted pt to go over mm results pt is aware and deosn't have any questions or concerns

## 2020-05-07 ENCOUNTER — Other Ambulatory Visit: Payer: Self-pay | Admitting: Internal Medicine

## 2020-05-07 DIAGNOSIS — I10 Essential (primary) hypertension: Secondary | ICD-10-CM

## 2020-05-07 DIAGNOSIS — E119 Type 2 diabetes mellitus without complications: Secondary | ICD-10-CM

## 2020-05-07 DIAGNOSIS — E782 Mixed hyperlipidemia: Secondary | ICD-10-CM

## 2020-05-10 ENCOUNTER — Other Ambulatory Visit: Payer: Self-pay

## 2020-05-10 ENCOUNTER — Ambulatory Visit: Payer: Medicare Other | Attending: Internal Medicine | Admitting: Internal Medicine

## 2020-05-10 ENCOUNTER — Encounter: Payer: Self-pay | Admitting: Internal Medicine

## 2020-05-10 VITALS — BP 122/77 | HR 93 | Temp 98.5°F | Resp 16 | Ht 72.0 in | Wt 195.2 lb

## 2020-05-10 DIAGNOSIS — Z2821 Immunization not carried out because of patient refusal: Secondary | ICD-10-CM

## 2020-05-10 DIAGNOSIS — E119 Type 2 diabetes mellitus without complications: Secondary | ICD-10-CM | POA: Diagnosis present

## 2020-05-10 DIAGNOSIS — R0981 Nasal congestion: Secondary | ICD-10-CM

## 2020-05-10 DIAGNOSIS — Z7982 Long term (current) use of aspirin: Secondary | ICD-10-CM | POA: Insufficient documentation

## 2020-05-10 DIAGNOSIS — E785 Hyperlipidemia, unspecified: Secondary | ICD-10-CM | POA: Diagnosis not present

## 2020-05-10 DIAGNOSIS — Z6826 Body mass index (BMI) 26.0-26.9, adult: Secondary | ICD-10-CM | POA: Diagnosis not present

## 2020-05-10 DIAGNOSIS — F17200 Nicotine dependence, unspecified, uncomplicated: Secondary | ICD-10-CM | POA: Diagnosis not present

## 2020-05-10 DIAGNOSIS — E782 Mixed hyperlipidemia: Secondary | ICD-10-CM | POA: Insufficient documentation

## 2020-05-10 DIAGNOSIS — Z79899 Other long term (current) drug therapy: Secondary | ICD-10-CM | POA: Diagnosis not present

## 2020-05-10 DIAGNOSIS — E669 Obesity, unspecified: Secondary | ICD-10-CM | POA: Diagnosis not present

## 2020-05-10 DIAGNOSIS — Z794 Long term (current) use of insulin: Secondary | ICD-10-CM | POA: Diagnosis not present

## 2020-05-10 DIAGNOSIS — F17210 Nicotine dependence, cigarettes, uncomplicated: Secondary | ICD-10-CM | POA: Insufficient documentation

## 2020-05-10 DIAGNOSIS — E1169 Type 2 diabetes mellitus with other specified complication: Secondary | ICD-10-CM | POA: Diagnosis not present

## 2020-05-10 DIAGNOSIS — I10 Essential (primary) hypertension: Secondary | ICD-10-CM | POA: Diagnosis not present

## 2020-05-10 LAB — GLUCOSE, POCT (MANUAL RESULT ENTRY): POC Glucose: 202 mg/dl — AB (ref 70–99)

## 2020-05-10 LAB — POCT GLYCOSYLATED HEMOGLOBIN (HGB A1C): HbA1c, POC (controlled diabetic range): 9.3 % — AB (ref 0.0–7.0)

## 2020-05-10 MED ORDER — PEN NEEDLES 31G X 8 MM MISC: 6 refills | Status: DC

## 2020-05-10 MED ORDER — ACCU-CHEK FASTCLIX LANCET KIT: PACK | 6 refills | Status: DC

## 2020-05-10 MED ORDER — ACCU-CHEK GUIDE VI STRP: ORAL_STRIP | 12 refills | Status: DC

## 2020-05-10 MED ORDER — LANTUS SOLOSTAR 100 UNIT/ML ~~LOC~~ SOPN: 10.0000 [IU] | PEN_INJECTOR | Freq: Every day | SUBCUTANEOUS | 11 refills | Status: DC

## 2020-05-10 NOTE — Progress Notes (Signed)
Patient ID: Stephanie Neal, female    DOB: [birth year]  MRN: [id number]  CC: Diabetes and Hypertension   Subjective: Stephanie Neal is a 65 y.o. female who presents for chronic ds management Her concerns today include:  Hx of DM, HTN, tob, obesity, HL.  DIABETES TYPE 2 Last A1C:   Results for orders placed or performed in visit on 05/10/20  POCT glucose (manual entry)  Result Value Ref Range   POC Glucose 202 (A) 70 - 99 mg/dl  POCT glycosylated hemoglobin (Hb A1C)  Result Value Ref Range   Hemoglobin A1C     HbA1c POC (<> result, manual entry)     HbA1c, POC (prediabetic range)     HbA1c, POC (controlled diabetic range) 9.3 (A) 0.0 - 7.0 %    Med Adherence:  [x]  Yes.  She is on Metformin, Farxiga and Amaryl.    Medication side effects:  []  Yes    [x]  No Home Monitoring?  []  Yes    [x]  No.  Needs new meter Home glucose results range: Diet Adherence: drinks [employer].  Eating fruits and veggies and not too much white carbs Exercise: [x]  Yes  - walking 30 mins a day Hypoglycemic episodes?: []  Yes    [x]  No Numbness of the feet? []  Yes    [x]  No Retinopathy hx? []  Yes    []  No Last eye exam: Overdue for eye exam.  She was supposed to schedule after her last visit. Comments:    HYPERTENSION Currently taking: see medication list.  She feels her blood pressure may be a bit elevated because she is worried about her grandson.  She has an appointment with social services today regarding him. Med Adherence: [x]  Yes    []  No Medication side effects: []  Yes    [x]  No Adherence with salt restriction: [x]  Yes    []  No Home Monitoring?: []  Yes    [x]  No -but does have a device Monitoring Frequency: []  Yes    []  No Home BP results range: []  Yes    []  No SOB? []  Yes    [x]  No Chest Pain?: []  Yes    [x]  No Leg swelling?: []  Yes    [x]  No Headaches?: []  Yes    [x]  No Dizziness? []  Yes    [x]  No Comments:   Has sinus congestion with discolored mucus.  No pressure behind the  maxillary or frontal sinuses.  Taking Mucinex. Has been going out in the cold air in mornings to get grandchildren on school bus.  No fever or SOB  HL: compliant with taking Lipitor.  Tolerating the medication  Tob dep:  Slowed down but is vague in answering of how much she is still smoking.  She states that she is still working on trying to quit. Marland Kitchen  HM:  Will get COVID booster 05/15/2020  Patient Active Problem List   Diagnosis Date Noted  . Hyperlipidemia associated with type 2 diabetes mellitus ([place]) 11/18/2019  . Chronic pain of right knee 11/18/2019  . Mixed hyperlipidemia 06/25/2018  . Over weight 06/25/2018  . Stress due to family tension 11/13/2017  . DM type 2 (diabetes mellitus, type 2) ([place]) 03/05/2014  . HYPERCHOLESTEROLEMIA 03/12/2007  . OBESITY 03/12/2007  . Tobacco dependence 03/12/2007  . HTN (hypertension) 03/12/2007  . ALLERGIC RHINITIS, SEASONAL 03/12/2007     Current Outpatient Medications on File Prior to Visit  Medication Sig Dispense Refill  . albuterol (PROVENTIL HFA;VENTOLIN HFA) 108 (90 Base) MCG/ACT  inhaler Inhale 1-2 puffs into the lungs every 6 (six) hours as needed for wheezing or shortness of breath. 1 Inhaler 0  . amLODipine (NORVASC) 10 MG tablet TAKE 1 TABLET BY MOUTH EVERY DAY 90 tablet 0  . atorvastatin (LIPITOR) 40 MG tablet TAKE 1 TABLET BY MOUTH EVERY DAY 90 tablet 0  . Blood Glucose Monitoring Suppl (ACCU-CHEK GUIDE ME) w/Device KIT Use as directed 1 kit 0  . [facility] ASPIRIN ADULT LOW DOSE 81 MG chewable tablet CHEW 1 TABLET (81 MG TOTAL) BY MOUTH DAILY. 100 tablet 2  . dapagliflozin propanediol (FARXIGA) 10 MG TABS tablet Take 10 mg by mouth daily before breakfast. 30 tablet 6  . diclofenac Sodium (VOLTAREN) 1 % GEL Apply 2 g topically 4 (four) times daily. 100 g 1  . fluticasone (FLONASE) 50 MCG/ACT nasal spray Place 2 sprays into both nostrils daily. (Patient not taking: Reported on 06/25/2018) 16 g 6  . glimepiride (AMARYL) 2 MG tablet TAKE 1  TABLET (2 MG TOTAL) BY MOUTH 2 (TWO) TIMES DAILY. 180 tablet 0  . lisinopril (ZESTRIL) 30 MG tablet TAKE 1 TABLET BY MOUTH EVERY DAY 90 tablet 0  . metFORMIN (GLUCOPHAGE) 1000 MG tablet TAKE 1 TABLET BY MOUTH 2 (TWO) TIMES DAILY WITH A MEAL. FOR DIABETES 180 tablet 0   No current facility-administered medications on file prior to visit.    Allergies  Allergen Reactions  . Penicillins Other (See Comments)    "blanked out" Has patient had a PCN reaction causing immediate rash, facial/tongue/throat swelling, SOB or lightheadedness with hypotension: No Has patient had a PCN reaction causing severe rash involving mucus membranes or skin necrosis: No Has patient had a PCN reaction that required hospitalization No Has patient had a PCN reaction occurring within the last 10 years: No If all of the above answers are "NO", then may proceed with Cephalosporin use.     Social History   Socioeconomic History  . Marital status: Single    Spouse name: Not on file  . Number of children: Not on file  . Years of education: Not on file  . Highest education level: Not on file  Occupational History  . Not on file  Tobacco Use  . Smoking status: Current Every Day Smoker    Packs/day: 0.25    Types: Cigarettes  . Smokeless tobacco: Never Used  Substance and Sexual Activity  . Alcohol use: No  . Drug use: No  . Sexual activity: Not on file  Other Topics Concern  . Not on file  Social History Narrative  . Not on file   Social Determinants of Health   Financial Resource Strain: Not on file  Food Insecurity: Not on file  Transportation Needs: Not on file  Physical Activity: Not on file  Stress: Not on file  Social Connections: Not on file  Intimate Partner Violence: Not on file    Family History  Problem Relation Age of Onset  . Hypertension Mother   . Diabetes Mother   . COPD Father   . Heart disease Father   . Diabetes Father     No past surgical history on file.  ROS: Review  of Systems Negative except as stated above  PHYSICAL EXAM: BP 122/77   Pulse 93   Temp 98.5 F (36.9 C)   Resp 16   Ht 6' (1.829 m)   Wt 195 lb 3.2 oz (88.5 kg)   SpO2 97%   BMI 26.47 kg/m   Physical Exam  General appearance - alert, well appearing, and in no distress Mental status - normal mood, behavior, speech, dress, motor activity, and thought processes Neck - supple, no significant adenopathy Nose: No enlargement of nasal turbinates.  Nasal mucosa dry. Mouth: Throat is clear without erythema or exudates. Chest - clear to auscultation, no wheezes, rales or rhonchi, symmetric air entry Heart - normal rate, regular rhythm, normal S1, S2, no murmurs, rubs, clicks or gallops Extremities - peripheral pulses normal, no pedal edema, no clubbing or cyanosis   CMP Latest Ref Rng & Units 05/19/2019 06/25/2018 05/24/2017  Glucose 65 - 99 mg/dL 163(H) 83 128(H)  BUN 8 - 27 mg/dL $Remove'10 11 9  'xKBqhTW$ Creatinine 0.57 - 1.00 mg/dL 0.79 0.75 0.94  Sodium 134 - 144 mmol/L 139 142 142  Potassium 3.5 - 5.2 mmol/L 4.2 4.1 4.4  Chloride 96 - 106 mmol/L 103 104 102  CO2 20 - 29 mmol/L $RemoveB'23 23 26  'aPwozeRC$ Calcium 8.7 - 10.3 mg/dL 9.2 9.3 9.6  Total Protein 6.0 - 8.5 g/dL 6.2 6.2 6.3  Total Bilirubin 0.0 - 1.2 mg/dL <0.2 <0.2 0.2  Alkaline Phos 39 - 117 IU/L 64 67 61  AST 0 - 40 IU/L $Remov'11 11 13  'cbBRRE$ ALT 0 - 32 IU/L $Remov'7 8 8   'juIyne$ Lipid Panel     Component Value Date/Time   CHOL 162 05/19/2019 1023   TRIG 133 05/19/2019 1023   HDL 41 05/19/2019 1023   CHOLHDL 4.0 05/19/2019 1023   CHOLHDL 5.6 (H) 06/09/2015 1023   VLDL 22 06/09/2015 1023   LDLCALC 97 05/19/2019 1023    CBC    Component Value Date/Time   WBC 5.8 05/19/2019 1023   WBC 5.4 09/08/2013 0859   RBC 4.82 05/19/2019 1023   RBC 4.68 09/08/2013 0859   HGB 13.4 05/19/2019 1023   HCT 38.8 05/19/2019 1023   PLT 228 05/19/2019 1023   MCV 81 05/19/2019 1023   MCH 27.8 05/19/2019 1023   MCH 27.8 09/08/2013 0859   MCHC 34.5 05/19/2019 1023   MCHC 33.9  09/08/2013 0859   RDW 13.9 05/19/2019 1023   LYMPHSABS 1.7 01/05/2011 1513   MONOABS 0.3 01/05/2011 1513   EOSABS 0.1 01/05/2011 1513   BASOSABS 0.0 01/05/2011 1513    ASSESSMENT AND PLAN: 1. Type 2 diabetes mellitus without complication, without long-term current use of insulin (HCC) Not at goal.  Discussed the importance of healthy eating habits and regular exercise and helping to control diabetes. A1c has been gradually increasing.  I recommend stopping the glimepiride and starting Lantus insulin 10 units at bedtime.  Patient was agreeable to this but did not have time today to meet with the clinical pharmacist that she had an appointment with social services.  She will return on Thursday of this week to meet with the clinical pharmacist for teaching on the insulin pen.  When she starts the Lantus, she should discontinue the glimepiride. - POCT glucose (manual entry) - POCT glycosylated hemoglobin (Hb A1C) - CBC; Future - Comprehensive metabolic panel; Future - Lipid panel; Future - Microalbumin / creatinine urine ratio; Future - insulin glargine (LANTUS SOLOSTAR) 100 UNIT/ML Solostar Pen; Inject 10 Units into the skin daily.  Dispense: 15 mL; Refill: 11 - Insulin Pen Needle (PEN NEEDLES) 31G X 8 MM MISC; UAD  Dispense: 100 each; Refill: 6 - Lancets Misc. (ACCU-CHEK FASTCLIX LANCET) KIT; Use as directed  Dispense: 1 kit; Refill: 6 - glucose blood (ACCU-CHEK GUIDE) test strip; Use as instructed  Dispense: 100  each; Refill: 12 - Ambulatory referral to Ophthalmology  2. Hyperlipidemia associated with type 2 diabetes mellitus ([place]) Continue atorvastatin  3. Tetanus, diphtheria, and acellular pertussis (Tdap) vaccination declined Recommended.  Patient declined  4. Tobacco dependence Continue to encourage her to discontinue smoking.  Encouraged her to set a quit date.  Less than 5 minutes spent on counseling.  5. Essential hypertension At goal.  Continue current medications and  low-salt diet.  6. Sinus congestion Recommend some Coricidin HBP over-the-counter to use as needed.  Follow-up with me in 6 weeks for Medicare wellness visit.  Patient was given the opportunity to ask questions.  Patient verbalized understanding of the plan and was able to repeat key elements of the plan.   Orders Placed This Encounter  Procedures  . CBC  . Comprehensive metabolic panel  . Lipid panel  . Microalbumin / creatinine urine ratio  . Ambulatory referral to Ophthalmology  . POCT glucose (manual entry)  . POCT glycosylated hemoglobin (Hb A1C)     Requested Prescriptions   Signed Prescriptions Disp Refills  . insulin glargine (LANTUS SOLOSTAR) 100 UNIT/ML Solostar Pen 15 mL 11    Sig: Inject 10 Units into the skin daily.  . Insulin Pen Needle (PEN NEEDLES) 31G X 8 MM MISC 100 each 6    Sig: UAD  . Lancets Misc. (ACCU-CHEK FASTCLIX LANCET) KIT 1 kit 6    Sig: Use as directed  . glucose blood (ACCU-CHEK GUIDE) test strip 100 each 12    Sig: Use as instructed    Return in about 6 weeks (around 06/21/2020) for Give appt with Lurena Joiner this Thursday morning for insulin teaching. . Follow-up with me in 6 weeks for Medicare wellness visit. Karle Plumber, MD, FACP

## 2020-05-10 NOTE — Patient Instructions (Signed)
You can use Coricidin HBP over-the-counter as needed for congestion.  Please return to the lab on Thursday morning to have your blood test done.  Please return on Thursday morning to see the clinical pharmacist to be shown how to do the insulin injections.  Once you start the insulin, you should stop the glimepiride. I have submitted the referral for the eye doctor.  You are overdue for your eye appointment.   Diabetes Mellitus and Nutrition, Adult When you have diabetes, or diabetes mellitus, it is very important to have healthy eating habits because your blood sugar (glucose) levels are greatly affected by what you eat and drink. Eating healthy foods in the right amounts, at about the same times every day, can help you:  Control your blood glucose.  Lower your risk of heart disease.  Improve your blood pressure.  Reach or maintain a healthy weight. What can affect my meal plan? Every person with diabetes is different, and each person has different needs for a meal plan. Your health care provider may recommend that you work with a dietitian to make a meal plan that is best for you. Your meal plan may vary depending on factors such as:  The calories you need.  The medicines you take.  Your weight.  Your blood glucose, blood pressure, and cholesterol levels.  Your activity level.  Other health conditions you have, such as heart or kidney disease. How do carbohydrates affect me? Carbohydrates, also called carbs, affect your blood glucose level more than any other type of food. Eating carbs naturally raises the amount of glucose in your blood. Carb counting is a method for keeping track of how many carbs you eat. Counting carbs is important to keep your blood glucose at a healthy level, especially if you use insulin or take certain oral diabetes medicines. It is important to know how many carbs you can safely have in each meal. This is different for every person. Your dietitian can  help you calculate how many carbs you should have at each meal and for each snack. How does alcohol affect me? Alcohol can cause a sudden decrease in blood glucose (hypoglycemia), especially if you use insulin or take certain oral diabetes medicines. Hypoglycemia can be a life-threatening condition. Symptoms of hypoglycemia, such as sleepiness, dizziness, and confusion, are similar to symptoms of having too much alcohol.  Do not drink alcohol if: ? Your health care provider tells you not to drink. ? You are pregnant, may be pregnant, or are planning to become pregnant.  If you drink alcohol: ? Do not drink on an empty stomach. ? Limit how much you use to:  0-1 drink a day for women.  0-2 drinks a day for men. ? Be aware of how much alcohol is in your drink. In the [place] one drink equals one 12 oz bottle of beer (355 mL), one 5 oz glass of wine (148 mL), or one 1 oz glass of hard liquor (44 mL). ? Keep yourself hydrated with water, diet soda, or unsweetened iced tea.  Keep in mind that regular soda, juice, and other mixers may contain a lot of sugar and must be counted as carbs. What are tips for following this plan? Reading food labels  Start by checking the serving size on the "Nutrition Facts" label of packaged foods and drinks. The amount of calories, carbs, fats, and other nutrients listed on the label is based on one serving of the item. Many items contain more than one  serving per package.  Check the total grams (g) of carbs in one serving. You can calculate the number of servings of carbs in one serving by dividing the total carbs by 15. For example, if a food has 30 g of total carbs per serving, it would be equal to 2 servings of carbs.  Check the number of grams (g) of saturated fats and trans fats in one serving. Choose foods that have a low amount or none of these fats.  Check the number of milligrams (mg) of salt (sodium) in one serving. Most people should limit total sodium  intake to less than 2,300 mg per day.  Always check the nutrition information of foods labeled as "low-fat" or "nonfat." These foods may be higher in added sugar or refined carbs and should be avoided.  Talk to your dietitian to identify your daily goals for nutrients listed on the label. Shopping  Avoid buying canned, pre-made, or processed foods. These foods tend to be high in fat, sodium, and added sugar.  Shop around the outside edge of the grocery store. This is where you will most often find fresh fruits and vegetables, bulk grains, fresh meats, and fresh dairy. Cooking  Use low-heat cooking methods, such as baking, instead of high-heat cooking methods like deep frying.  Cook using healthy oils, such as olive, canola, or sunflower oil.  Avoid cooking with butter, cream, or high-fat meats. Meal planning  Eat meals and snacks regularly, preferably at the same times every day. Avoid going long periods of time without eating.  Eat foods that are high in fiber, such as fresh fruits, vegetables, beans, and whole grains. Talk with your dietitian about how many servings of carbs you can eat at each meal.  Eat 4-6 oz (112-168 g) of lean protein each day, such as lean meat, chicken, fish, eggs, or tofu. One ounce (oz) of lean protein is equal to: ? 1 oz (28 g) of meat, chicken, or fish. ? 1 egg. ?  cup (62 g) of tofu.  Eat some foods each day that contain healthy fats, such as avocado, nuts, seeds, and fish.   What foods should I eat? Fruits Berries. Apples. Oranges. Peaches. Apricots. Plums. Grapes. Mango. Papaya. Pomegranate. Kiwi. Cherries. Vegetables Lettuce. Spinach. Leafy greens, including kale, chard, collard greens, and mustard greens. Beets. Cauliflower. Cabbage. Broccoli. Carrots. Green beans. Tomatoes. Peppers. Onions. Cucumbers. Brussels sprouts. Grains Whole grains, such as whole-wheat or whole-grain bread, crackers, tortillas, cereal, and pasta. Unsweetened oatmeal.  Quinoa. Brown or wild rice. Meats and other proteins Seafood. Poultry without skin. Lean cuts of poultry and beef. Tofu. Nuts. Seeds. Dairy Low-fat or fat-free dairy products such as milk, yogurt, and cheese. The items listed above may not be a complete list of foods and beverages you can eat. Contact a dietitian for more information. What foods should I avoid? Fruits Fruits canned with syrup. Vegetables Canned vegetables. Frozen vegetables with butter or cream sauce. Grains Refined white flour and flour products such as bread, pasta, snack foods, and cereals. Avoid all processed foods. Meats and other proteins Fatty cuts of meat. Poultry with skin. Breaded or fried meats. Processed meat. Avoid saturated fats. Dairy Full-fat yogurt, cheese, or milk. Beverages Sweetened drinks, such as soda or iced tea. The items listed above may not be a complete list of foods and beverages you should avoid. Contact a dietitian for more information. Questions to ask a health care provider  Do I need to meet with a diabetes educator?  Do I need to meet with a dietitian?  What number can I call if I have questions?  When are the best times to check my blood glucose? Where to find more information:  American Diabetes Association: diabetes.org  Academy of Nutrition and Dietetics: www.eatright.[employer] of Diabetes and Digestive and Kidney Diseases: CarFlippers.tn  Association of Diabetes Care and Education Specialists: www.diabeteseducator.org Summary  It is important to have healthy eating habits because your blood sugar (glucose) levels are greatly affected by what you eat and drink.  A healthy meal plan will help you control your blood glucose and maintain a healthy lifestyle.  Your health care provider may recommend that you work with a dietitian to make a meal plan that is best for you.  Keep in mind that carbohydrates (carbs) and alcohol have immediate effects on your  blood glucose levels. It is important to count carbs and to use alcohol carefully. This information is not intended to replace advice given to you by your health care provider. Make sure you discuss any questions you have with your health care provider. Document Revised: 03/25/2019 Document Reviewed: 03/25/2019 Elsevier Patient Education  2021 [employer].

## 2020-06-24 ENCOUNTER — Ambulatory Visit: Payer: Medicare Other | Admitting: Pharmacist

## 2020-06-29 ENCOUNTER — Ambulatory Visit: Payer: Medicare Other | Attending: Internal Medicine | Admitting: Internal Medicine

## 2020-06-29 ENCOUNTER — Encounter: Payer: Self-pay | Admitting: Internal Medicine

## 2020-06-29 ENCOUNTER — Other Ambulatory Visit: Payer: Self-pay

## 2020-06-29 VITALS — BP 128/79 | HR 96 | Resp 16 | Ht 71.0 in | Wt 190.6 lb

## 2020-06-29 DIAGNOSIS — Z0000 Encounter for general adult medical examination without abnormal findings: Secondary | ICD-10-CM | POA: Diagnosis not present

## 2020-06-29 DIAGNOSIS — E785 Hyperlipidemia, unspecified: Secondary | ICD-10-CM | POA: Diagnosis not present

## 2020-06-29 DIAGNOSIS — E119 Type 2 diabetes mellitus without complications: Secondary | ICD-10-CM

## 2020-06-29 DIAGNOSIS — Z6826 Body mass index (BMI) 26.0-26.9, adult: Secondary | ICD-10-CM | POA: Insufficient documentation

## 2020-06-29 DIAGNOSIS — Z713 Dietary counseling and surveillance: Secondary | ICD-10-CM | POA: Insufficient documentation

## 2020-06-29 DIAGNOSIS — Z7984 Long term (current) use of oral hypoglycemic drugs: Secondary | ICD-10-CM | POA: Diagnosis not present

## 2020-06-29 DIAGNOSIS — Z791 Long term (current) use of non-steroidal anti-inflammatories (NSAID): Secondary | ICD-10-CM | POA: Insufficient documentation

## 2020-06-29 DIAGNOSIS — Z7982 Long term (current) use of aspirin: Secondary | ICD-10-CM | POA: Insufficient documentation

## 2020-06-29 DIAGNOSIS — I10 Essential (primary) hypertension: Secondary | ICD-10-CM | POA: Diagnosis not present

## 2020-06-29 DIAGNOSIS — F17200 Nicotine dependence, unspecified, uncomplicated: Secondary | ICD-10-CM | POA: Diagnosis not present

## 2020-06-29 DIAGNOSIS — Z7189 Other specified counseling: Secondary | ICD-10-CM

## 2020-06-29 DIAGNOSIS — Z79899 Other long term (current) drug therapy: Secondary | ICD-10-CM | POA: Diagnosis not present

## 2020-06-29 DIAGNOSIS — Z23 Encounter for immunization: Secondary | ICD-10-CM | POA: Insufficient documentation

## 2020-06-29 DIAGNOSIS — Z794 Long term (current) use of insulin: Secondary | ICD-10-CM | POA: Insufficient documentation

## 2020-06-29 DIAGNOSIS — Z716 Tobacco abuse counseling: Secondary | ICD-10-CM | POA: Insufficient documentation

## 2020-06-29 DIAGNOSIS — E669 Obesity, unspecified: Secondary | ICD-10-CM | POA: Insufficient documentation

## 2020-06-29 DIAGNOSIS — Z833 Family history of diabetes mellitus: Secondary | ICD-10-CM | POA: Insufficient documentation

## 2020-06-29 DIAGNOSIS — Z5320 Procedure and treatment not carried out because of patient's decision for unspecified reasons: Secondary | ICD-10-CM

## 2020-06-29 DIAGNOSIS — F17210 Nicotine dependence, cigarettes, uncomplicated: Secondary | ICD-10-CM | POA: Diagnosis not present

## 2020-06-29 DIAGNOSIS — Z8249 Family history of ischemic heart disease and other diseases of the circulatory system: Secondary | ICD-10-CM | POA: Insufficient documentation

## 2020-06-29 DIAGNOSIS — Z7182 Exercise counseling: Secondary | ICD-10-CM | POA: Insufficient documentation

## 2020-06-29 MED ORDER — GLIMEPIRIDE 2 MG PO TABS: 2.0000 mg | ORAL_TABLET | Freq: Two times a day (BID) | ORAL | 1 refills | Status: DC

## 2020-06-29 MED ORDER — NICOTINE POLACRILEX 2 MG MT GUM: 2.0000 mg | CHEWING_GUM | OROMUCOSAL | 0 refills | Status: DC | PRN

## 2020-06-29 NOTE — Progress Notes (Signed)
Subjective:    Stephanie Neal is a 65 y.o. female who presents for a Welcome to Medicare exam.  Hx of DM, HTN, tob, obesity, HL.  Review of Systems Endocrine: on last visit I recommend stopping the Amaryl and starting Lantus 10 units bedtime instead.  A1C was 9.3.  Pt has not started the Lantus.  She reports she was not taking the Amaryl 2 mg BID as she had told me previously; was taking it just once a day.  So she started taking BID and held off on starting the Lantus.  Not checking BS, testing supplies sent to her pharmacy but states they did not call her.  [facility]: Denies chest pain, shortness of breath, lower extremity edema GI: No problems swallowing.  Moving bowels okay.  No blood in the stools GU: She is postmenopausal.  Denies any problems passing her urine.  No incontinence of urine.        Objective:    Today's Vitals   06/29/20 1009  BP: 128/79  Pulse: 96  Resp: 16  SpO2: 98%  Weight: 190 lb 9.6 oz (86.5 kg)  Height: $Remove'5\' 11"'mHDubYp$  (1.803 m)  Body mass index is 26.58 kg/m.  Medications Outpatient Encounter Medications as of 06/29/2020  Medication Sig  . nicotine polacrilex (NICORETTE) 2 MG gum Take 1 each (2 mg total) by mouth as needed for smoking cessation.  Marland Kitchen albuterol (PROVENTIL HFA;VENTOLIN HFA) 108 (90 Base) MCG/ACT inhaler Inhale 1-2 puffs into the lungs every 6 (six) hours as needed for wheezing or shortness of breath.  Marland Kitchen amLODipine (NORVASC) 10 MG tablet TAKE 1 TABLET BY MOUTH EVERY DAY  . atorvastatin (LIPITOR) 40 MG tablet TAKE 1 TABLET BY MOUTH EVERY DAY  . Blood Glucose Monitoring Suppl (ACCU-CHEK GUIDE ME) w/Device KIT Use as directed  . [facility] ASPIRIN ADULT LOW DOSE 81 MG chewable tablet CHEW 1 TABLET (81 MG TOTAL) BY MOUTH DAILY.  . dapagliflozin propanediol (FARXIGA) 10 MG TABS tablet Take 10 mg by mouth daily before breakfast.  . diclofenac Sodium (VOLTAREN) 1 % GEL Apply 2 g topically 4 (four) times daily.  . fluticasone (FLONASE) 50 MCG/ACT nasal  spray Place 2 sprays into both nostrils daily. (Patient not taking: Reported on 06/25/2018)  . glimepiride (AMARYL) 2 MG tablet Take 1 tablet (2 mg total) by mouth 2 (two) times daily.  Marland Kitchen glucose blood (ACCU-CHEK GUIDE) test strip Use as instructed  . Insulin Pen Needle (PEN NEEDLES) 31G X 8 MM MISC UAD  . Lancets Misc. (ACCU-CHEK FASTCLIX LANCET) KIT Use as directed  . lisinopril (ZESTRIL) 30 MG tablet TAKE 1 TABLET BY MOUTH EVERY DAY  . metFORMIN (GLUCOPHAGE) 1000 MG tablet TAKE 1 TABLET BY MOUTH 2 (TWO) TIMES DAILY WITH A MEAL. FOR DIABETES  . [DISCONTINUED] glimepiride (AMARYL) 2 MG tablet TAKE 1 TABLET (2 MG TOTAL) BY MOUTH 2 (TWO) TIMES DAILY.  . [DISCONTINUED] insulin glargine (LANTUS SOLOSTAR) 100 UNIT/ML Solostar Pen Inject 10 Units into the skin daily.   No facility-administered encounter medications on file as of 06/29/2020.     History: Past Medical History:  Diagnosis Date  . Diabetes mellitus   . Hyperlipidemia   . Hypertension    No past surgical history on file.  Family History  Problem Relation Age of Onset  . Hypertension Mother   . Diabetes Mother   . COPD Father   . Heart disease Father   . Diabetes Father    Social History   Occupational History  .  Not on file  Tobacco Use  . Smoking status: Current Every Day Smoker    Packs/day: 0.25    Types: Cigarettes  . Smokeless tobacco: Never Used  Substance and Sexual Activity  . Alcohol use: No  . Drug use: No  . Sexual activity: Not on file    Tobacco Counseling -has slowed down but has not quit yet. Trying to quit to better her health and to be around for her grandchildren.  Currently at 2 cigarettes QOD.  Not sure what it will take to quit completely  Immunizations and Health Maintenance Immunization History  Administered Date(s) Administered  . Influenza,inj,Quad PF,6+ Mos 03/05/2014, 05/04/2016, 01/29/2017, 03/18/2018, 01/07/2019, 02/05/2020  . PFIZER(Purple Top)SARS-COV-2 Vaccination 10/20/2019,  11/17/2019, 06/22/2020  . Pneumococcal Conjugate-13 06/25/2014  . Pneumococcal Polysaccharide-23 07/26/2016  . Tdap 05/01/2010, 06/29/2020   Health Maintenance Due  Topic Date Due  . OPHTHALMOLOGY EXAM  07/14/2015  . FOOT EXAM  04/04/2017  . DEXA SCAN  Never done    Activities of Daily Living In your present state of health, do you have any difficulty performing the following activities: 06/29/2020  Hearing? N  Vision? N  Difficulty concentrating or making decisions? N  Walking or climbing stairs? N  Dressing or bathing? N  Doing errands, shopping? N  Preparing Food and eating ? N  Using the Toilet? N  In the past six months, have you accidently leaked urine? N  Do you have problems with loss of bowel control? N  Managing your Finances? N  Housekeeping or managing your Housekeeping? N  Some recent data might be hidden    Physical Exam   Constitutional: Appears well-developed and well-nourished. No distress. Head: Normocephalic. Atraumatic Ears: External right and left ear normal.   Eyes: Conjunctivae and EOM are normal. PERRLA, no scleral icterus.  Mouth: no oral lesions, good oral hygiene, throat clear without exudates.  She has partials above. Neck: Neck supple.  No tracheal deviation. No thyromegaly. No cervical LN CVS: RRR, S1/S2 +, no murmurs, no gallops, no carotid bruit. No JVD Pulmonary: Effort and breath sounds normal, no stridor, rhonchi, wheezes, rales.  Musculoskeletal: Normal range of motion. No edema and no tenderness.  Neuro: Alert and oriented x3.  Cns grossly intact, Power: 5/5 BL in all 4s.   Normal  muscle tone, coordination. . Skin: Skin is warm and dry. No rash noted. Not diaphoretic. No erythema. No pallor.  Psychiatric: Normal mood and affect. Behavior, judgment, thought content normal.  Diabetic Foot Exam - Simple   Simple Foot Form Visual Inspection See comments: Yes Sensation Testing Intact to touch and monofilament testing bilaterally:  Yes Pulse Check Posterior Tibialis and Dorsalis pulse intact bilaterally: Yes Comments She has bunions on both big toes.  They are not inflamed.  Toenails mildly thick but clipped.      Advanced Directives: Does Patient Have a Medical Advance Directive?: No Would patient like information on creating a medical advance directive?: No - Patient declined Patient was not clear what an advanced directive is.  I spent some time explaining to her what is an advanced directive and components of an advanced directive including healthcare power of attorney and living will.  At the end patient wanted more information and agreed to take the package home to read over it in more detail.  I have encouraged her to execute a living will or healthcare power of attorney.  If she does so, I request that she brings a copy for our record.  Assessment:  This is a routine wellness examination for this patient .  Vision/Hearing screen Whisper test was normal.  Hearing Screening   125Hz  250Hz  500Hz  1000Hz  2000Hz  3000Hz  4000Hz  6000Hz  8000Hz   Right ear:           Left ear:             Visual Acuity Screening   Right eye Left eye Both eyes  Without correction: 20 30 20 30 20 30   With correction:     Has appt today with [facility] today.  I have given her one of my business card so that they can send me information about her exam.  Dietary issues and exercise activities discussed:  -reports walking around her block 2 x a wk. Takes her about 15 mins -doing good with eating habits.  No salt, eating more veggies and fish.  Staying away from sweets.  Goals    .   Diabetes Patient stated goal (pt-stated)      I want to get my A1c better.  I would like to see a nutritionist.      Depression Screen PHQ 2/9 Scores 06/29/2020 05/10/2020 11/18/2019 05/19/2019  PHQ - 2 Score 0 0 0 0  PHQ- 9 Score - - - -     Fall Risk Fall Risk  06/29/2020  Falls in the past year? 0  Number falls in past yr: 0  Injury  with Fall? 0    Cognitive Function: MMSE - Mini Mental State Exam 06/29/2020  Orientation to time 5  Orientation to Place 5  Registration 3  Attention/ Calculation 5  Recall 0  Language- name 2 objects 2  Language- repeat 1  Language- follow 3 step command 3  Language- read & follow direction 1  Write a sentence 1  Copy design 1  Total score 27        Patient Care Team: Ladell Pier, MD as PCP - General (Internal Medicine) Optometry: [facility] Gastroenterology: Dr. Carol Ada    Plan:   1. Encounter for Medicare annual wellness exam -Recommended bone density study for osteoporosis screening.  Patient declined. Encouraged to quit smoking.  Commended her on cutting back.  She is wanting to quit.  We discussed trying her with the nicotine gum.  Prescription sent to her pharmacy.  If her insurance does not pay for the nicotine gum, I have informed her to call [phone] quit now to request the free gum. Encourage her to continue healthy eating habits and regular exercise.  Referral submitted to nutritionist.  2. Advance directive discussed with patient See discussion above under advanced directive.  3. Type 2 diabetes mellitus without complication, without long-term current use of insulin ([place]) Encourage her to continue healthy eating habits and regular exercise.  Referral submitted to nutritionist. -She is wanting to continue oral medications for now.  I have rewritten prescription for Amaryl to be taken twice a day.  She states that upon her return in 4 months if A1c still not at goal then she will consider taking the Lantus insulin. - Amb ref to [facility] - glimepiride (AMARYL) 2 MG tablet; Take 1 tablet (2 mg total) by mouth 2 (two) times daily.  Dispense: 180 tablet; Refill: 1  4. Tobacco dependence Advised to quit.  Discussed health risks associated with smoking.  Patient wanting to quit.  I have commended her on cutting back.  She is  agreeable to trying the nicotine gum.  Less than 5  minutes spent on counseling. - nicotine polacrilex (NICORETTE) 2 MG gum; Take 1 each (2 mg total) by mouth as needed for smoking cessation.  Dispense: 100 tablet; Refill: 0  5. Osteoporosis screening declined Patient declined bone density study  6. Need for Tdap vaccination Given today.  I have personally reviewed and noted the following in the patient's chart:   . Medical and social history . Use of alcohol, tobacco or illicit drugs  . Current medications and supplements . Functional ability and status . Nutritional status . Physical activity . Advanced directives . List of other physicians . Hospitalizations, surgeries, and ER visits in previous 12 months . Vitals . Screenings to include cognitive, depression, and falls . Referrals and appointments  In addition, I have reviewed and discussed with patient certain preventive protocols, quality metrics, and best practice recommendations. A written personalized care plan for preventive services as well as general preventive health recommendations were provided to patient.     Karle Plumber, MD 06/29/2020

## 2020-06-29 NOTE — Patient Instructions (Addendum)
I have sent a prescription to your pharmacy for the nicotine gum.  If your insurance does not pay for it, you can call [phone] quit now to request the gum for free.  Please review the information that I gave you about the Advanced Directive.  If you do execute one, please bring a copy for our records.  Hold off on taking the Lantus insulin.  Okay to restart glimepiride 2 mg twice a day.  I have sent a prescription to your pharmacy for diabetic testing supplies.  Please check your blood sugars at least once a day before breakfast.  The goal for blood sugar before breakfast is 90-130.  Please return to the lab next week to have your blood test done.  Td (Tetanus, Diphtheria) Vaccine: What You Need to Know 1. Why get vaccinated? Td vaccine can prevent tetanus and diphtheria. Tetanus enters the body through cuts or wounds. Diphtheria spreads from person to person.  TETANUS (T) causes painful stiffening of the muscles. Tetanus can lead to serious health problems, including being unable to open the mouth, having trouble swallowing and breathing, or death.  DIPHTHERIA (D) can lead to difficulty breathing, heart failure, paralysis, or death. 2. Td vaccine Td is only for children 7 years and older, adolescents, and adults.  Td is usually given as a booster dose every 10 years, or after 5 years in the case of a severe or dirty wound or burn. Another vaccine, called "Tdap," may be used instead of Td. Tdap protects against pertussis, also known as "whooping cough," in addition to tetanus and diphtheria. Td may be given at the same time as other vaccines. 3. Talk with your health care provider Tell your vaccination provider if the person getting the vaccine:  Has had an allergic reaction after a previous dose of any vaccine that protects against tetanus or diphtheria, or has any severe, life-threatening allergies  Has ever had Guillain-Barr Syndrome (also called "GBS")  Has had severe pain or swelling  after a previous dose of any vaccine that protects against tetanus or diphtheria In some cases, your health care provider may decide to postpone Td vaccination until a future visit. People with minor illnesses, such as a cold, may be vaccinated. People who are moderately or severely ill should usually wait until they recover before getting Td vaccine.  Your health care provider can give you more information. 4. Risks of a vaccine reaction  Pain, redness, or swelling where the shot was given, mild fever, headache, feeling tired, and nausea, vomiting, diarrhea, or stomachache sometimes happen after Td vaccination. People sometimes faint after medical procedures, including vaccination. Tell your provider if you feel dizzy or have vision changes or ringing in the ears.  As with any medicine, there is a very remote chance of a vaccine causing a severe allergic reaction, other serious injury, or death. 5. What if there is a serious problem? An allergic reaction could occur after the vaccinated person leaves the clinic. If you see signs of a severe allergic reaction (hives, swelling of the face and throat, difficulty breathing, a fast heartbeat, dizziness, or weakness), call 9-1-1 and get the person to the nearest hospital.  For other signs that concern you, call your health care provider.  Adverse reactions should be reported to the Vaccine Adverse Event Reporting System (VAERS). Your health care provider will usually file this report, or you can do it yourself. Visit the VAERS website at www.vaers.LAgents.no or call 670-603-5360. VAERS is only for reporting  reactions, and VAERS staff members do not give medical advice. 6. The National Vaccine Injury Compensation Program The [employer] Vaccine Injury Compensation Program (VICP) is a federal program that was created to compensate people who may have been injured by certain vaccines. Claims regarding alleged injury or death due to vaccination have a time limit  for filing, which may be as short as two years. Visit the VICP website at SpiritualWord.at or call 774-501-3457 to learn about the program and about filing a claim. 7. How can I learn more?  Ask your health care provider.  Call your local or state health department.  Visit the website of the Food and Drug Administration (FDA) for vaccine package inserts and additional information at FinderList.no.  Contact the Centers for Disease Control and Prevention (CDC): ? Call 478-541-1596 (1-800-CDC-INFO) or ? Visit CDC's website at PicCapture.uy. Vaccine Information Statement Td (Tetanus, Diphtheria) Vaccine (12/05/2019) This information is not intended to replace advice given to you by your health care provider. Make sure you discuss any questions you have with your health care provider. Document Revised: 01/22/2020 Document Reviewed: 01/22/2020 Elsevier Patient Education  2021 [employer].

## 2020-07-21 ENCOUNTER — Ambulatory Visit: Payer: Medicare Other | Admitting: Registered"

## 2020-07-31 ENCOUNTER — Other Ambulatory Visit: Payer: Self-pay | Admitting: Internal Medicine

## 2020-07-31 DIAGNOSIS — I10 Essential (primary) hypertension: Secondary | ICD-10-CM

## 2020-07-31 DIAGNOSIS — E119 Type 2 diabetes mellitus without complications: Secondary | ICD-10-CM

## 2020-07-31 DIAGNOSIS — E782 Mixed hyperlipidemia: Secondary | ICD-10-CM

## 2020-07-31 NOTE — Telephone Encounter (Signed)
Requested medications are due for refill today yes  (this was meant  for Atorvastatin only)   Requested medications are on the active medication list yes   Last refill 1/7   Last visit 05/10/20   Future visit scheduled 10/28/20   Notes to clinic Pt did not have labs drawn that were ordered 05/10/20, failed protocol due to labs greater than 10 days old.

## 2020-07-31 NOTE — Telephone Encounter (Signed)
Requested medications are due for refill today yes  Requested medications are on the active medication list yes  Last refill 1/7  Last visit 05/10/20  Future visit scheduled 10/28/20  Notes to clinic Pt did not have labs drawn that were ordered 05/10/20, failed protocol due to labs greater than 71 days old.

## 2020-08-03 ENCOUNTER — Other Ambulatory Visit: Payer: Self-pay | Admitting: Internal Medicine

## 2020-08-03 DIAGNOSIS — I10 Essential (primary) hypertension: Secondary | ICD-10-CM

## 2020-08-28 ENCOUNTER — Encounter (HOSPITAL_COMMUNITY): Payer: Self-pay | Admitting: *Deleted

## 2020-08-28 ENCOUNTER — Ambulatory Visit (HOSPITAL_COMMUNITY)
Admission: EM | Admit: 2020-08-28 | Discharge: 2020-08-28 | Disposition: A | Discharge status: Home / Self Care | Payer: Medicare Other | Attending: Medical Oncology | Admitting: Medical Oncology

## 2020-08-28 ENCOUNTER — Other Ambulatory Visit: Payer: Self-pay

## 2020-08-28 DIAGNOSIS — M76891 Other specified enthesopathies of right lower limb, excluding foot: Secondary | ICD-10-CM

## 2020-08-28 MED ORDER — DICLOFENAC SODIUM 1 % EX GEL: 4.0000 g | Freq: Four times a day (QID) | CUTANEOUS | 0 refills | Status: DC

## 2020-08-28 NOTE — ED Provider Notes (Signed)
[place]    CSN: [id number] Arrival date & time: 08/28/20  1015      History   Chief Complaint Chief Complaint  Patient presents with  . Knee Pain    HPI Stephanie Neal is a 65 y.o. female.   HPI   Knee Pain: Pt reports acute on chronic pain of the right lateral knee. No new injury. Pain rated 9/10 in nature. Pain is worse at night when the leg is straightened. Pain is worse on the lateral aspect of knee which is chronic in nature. No posterior tenderness or effusion and no calf pain, SOB or chest pain. She has been prescribed diclofenac gel in the past which helped symptoms.    Past Medical History:  Diagnosis Date  . Diabetes mellitus   . Hyperlipidemia   . Hypertension     Patient Active Problem List   Diagnosis Date Noted  . Tetanus, diphtheria, and acellular pertussis (Tdap) vaccination declined 05/10/2020  . Hyperlipidemia associated with type 2 diabetes mellitus ([place]) 11/18/2019  . Chronic pain of right knee 11/18/2019  . Mixed hyperlipidemia 06/25/2018  . Over weight 06/25/2018  . Stress due to family tension 11/13/2017  . DM type 2 (diabetes mellitus, type 2) ([place]) 03/05/2014  . HYPERCHOLESTEROLEMIA 03/12/2007  . OBESITY 03/12/2007  . Tobacco dependence 03/12/2007  . HTN (hypertension) 03/12/2007  . ALLERGIC RHINITIS, SEASONAL 03/12/2007    History reviewed. No pertinent surgical history.  OB History   No obstetric history on file.      Home Medications    Prior to Admission medications   Medication Sig Start Date End Date Taking? Authorizing Provider  albuterol (PROVENTIL HFA;VENTOLIN HFA) 108 (90 Base) MCG/ACT inhaler Inhale 1-2 puffs into the lungs every 6 (six) hours as needed for wheezing or shortness of breath. 05/22/18   Robyn Haber, MD  amLODipine (NORVASC) 10 MG tablet TAKE 1 TABLET BY MOUTH EVERY DAY 07/31/20   Ladell Pier, MD  atorvastatin (LIPITOR) 40 MG tablet TAKE 1 TABLET BY MOUTH EVERY DAY 08/02/20    Ladell Pier, MD  Blood Glucose Monitoring Suppl (ACCU-CHEK GUIDE ME) w/Device KIT Use as directed 11/18/19   Ladell Pier, MD  [facility] ASPIRIN ADULT LOW DOSE 81 MG chewable tablet CHEW 1 TABLET (81 MG TOTAL) BY MOUTH DAILY. 08/08/19   Ladell Pier, MD  dapagliflozin propanediol (FARXIGA) 10 MG TABS tablet Take 10 mg by mouth daily before breakfast. 05/20/19   Ladell Pier, MD  diclofenac Sodium (VOLTAREN) 1 % GEL Apply 2 g topically 4 (four) times daily. 11/18/19   Ladell Pier, MD  fluticasone (FLONASE) 50 MCG/ACT nasal spray Place 2 sprays into both nostrils daily. Patient not taking: Reported on 06/25/2018 04/19/15   Lance Bosch, NP  glimepiride (AMARYL) 2 MG tablet Take 1 tablet (2 mg total) by mouth 2 (two) times daily. 06/29/20   Ladell Pier, MD  glucose blood (ACCU-CHEK GUIDE) test strip Use as instructed 05/10/20   Ladell Pier, MD  Insulin Pen Needle (PEN NEEDLES) 31G X 8 MM MISC UAD 05/10/20   Ladell Pier, MD  Lancets Misc. (ACCU-CHEK FASTCLIX LANCET) KIT Use as directed 05/10/20   Ladell Pier, MD  lisinopril (ZESTRIL) 30 MG tablet TAKE 1 TABLET BY MOUTH EVERY DAY 08/03/20   Ladell Pier, MD  metFORMIN (GLUCOPHAGE) 1000 MG tablet TAKE 1 TABLET BY MOUTH 2 (TWO) TIMES DAILY WITH A MEAL. FOR DIABETES 07/31/20   Ladell Pier,  MD  nicotine polacrilex (NICORETTE) 2 MG gum Take 1 each (2 mg total) by mouth as needed for smoking cessation. 06/29/20   Ladell Pier, MD    Family History Family History  Problem Relation Age of Onset  . Hypertension Mother   . Diabetes Mother   . COPD Father   . Heart disease Father   . Diabetes Father     Social History Social History   Tobacco Use  . Smoking status: Current Every Day Smoker    Packs/day: 0.25    Types: Cigarettes  . Smokeless tobacco: Never Used  Substance Use Topics  . Alcohol use: No  . Drug use: No     Allergies   Penicillins   Review of Systems Review of  Systems  As stated above in HPI Physical Exam Triage Vital Signs ED Triage Vitals  Enc Vitals Group     BP 08/28/20 1101 (!) 148/72     Pulse Rate 08/28/20 1101 99     Resp 08/28/20 1101 18     Temp 08/28/20 1101 97.9 F (36.6 C)     Temp Source 08/28/20 1101 Oral     SpO2 08/28/20 1101 98 %     Weight --      Height --      Head Circumference --      Peak Flow --      Pain Score 08/28/20 1059 9     Pain Loc --      Pain Edu? --      Excl. in [place]? --    No data found.  Updated Vital Signs BP (!) 148/72 (BP Location: Right Arm)   Pulse 99   Temp 97.9 F (36.6 C) (Oral)   Resp 18   SpO2 98%    Physical Exam Vitals and nursing note reviewed.  Musculoskeletal:        General: No swelling, tenderness, deformity or signs of injury. Normal range of motion.     Right lower leg: No edema.     Left lower leg: No edema.     Comments: Negative Homans sign bilaterally   Skin:    General: Skin is warm.     Capillary Refill: Capillary refill takes less than 2 seconds.     Findings: No erythema or rash.  Neurological:     Mental Status: She is oriented to person, place, and time.     Sensory: No sensory deficit.     Motor: No weakness.     Coordination: Coordination normal.     Gait: Gait normal.     Deep Tendon Reflexes: Reflexes normal.      UC Treatments / Results  Labs (all labs ordered are listed, but only abnormal results are displayed) Labs Reviewed - No data to display  EKG   Radiology No results found.  Procedures Procedures (including critical care time)  Medications Ordered in UC Medications - No data to display  Initial Impression / Assessment and Plan / UC Course  I have reviewed the triage vital signs and the nursing notes.  Pertinent labs & imaging results that were available during my care of the patient were reviewed by me and considered in my medical decision making (see chart for details).     New. Treating with Voltaren gel which she  has done well with in the past. I have encouraged her to consider PT and to discuss this with her PCP. Discussed red flag signs and symptoms.  Final Clinical  Impressions(s) / UC Diagnoses   Final diagnoses:  None   Discharge Instructions   None    ED Prescriptions    None     PDMP not reviewed this encounter.   Hughie Closs, [place] 08/28/20 1134

## 2020-08-28 NOTE — ED Triage Notes (Signed)
Pt has a swollen area to Lateral side of RT knee. No injury.

## 2020-10-24 ENCOUNTER — Other Ambulatory Visit: Payer: Self-pay | Admitting: Internal Medicine

## 2020-10-24 DIAGNOSIS — E119 Type 2 diabetes mellitus without complications: Secondary | ICD-10-CM

## 2020-10-24 NOTE — Telephone Encounter (Signed)
Requested medication (s) are due for refill today: yes  Requested medication (s) are on the active medication list: yes  Last refill:  07/31/20  Future visit scheduled: yes  Notes to clinic: overdue lab work (Hgb A1C)   Requested Prescriptions  Pending Prescriptions Disp Refills   metFORMIN (GLUCOPHAGE) 1000 MG tablet [Pharmacy Med Name: METFORMIN HCL 1,000 MG TABLET] 180 tablet 0    Sig: TAKE 1 TABLET BY MOUTH 2 (TWO) TIMES DAILY WITH A MEAL. FOR DIABETES      Endocrinology:  Diabetes - Biguanides Failed - 10/24/2020  8:31 AM      Failed - Cr in normal range and within 360 days    Creat  Date Value Ref Range Status  04/04/2016 1.11 (H) 0.50 - 0.99 mg/dL Final    Comment:      For patients > or = 66 years of age: The upper reference limit for Creatinine is approximately 13% higher for people identified as African-American.      Creatinine, Ser  Date Value Ref Range Status  05/19/2019 0.79 0.57 - 1.00 mg/dL Final   Creatinine, Urine  Date Value Ref Range Status  04/04/2016 43 20 - 320 mg/dL Final          Failed - HBA1C is between 0 and 7.9 and within 180 days    HbA1c, POC (controlled diabetic range)  Date Value Ref Range Status  05/10/2020 9.3 (A) 0.0 - 7.0 % Final          Failed - AA eGFR in normal range and within 360 days    GFR, Est African American  Date Value Ref Range Status  04/04/2016 62 >=60 mL/min Final   GFR calc Af Amer  Date Value Ref Range Status  05/19/2019 91 >59 mL/min/1.73 Final   GFR, Est Non African American  Date Value Ref Range Status  04/04/2016 54 (L) >=60 mL/min Final   GFR calc non Af Amer  Date Value Ref Range Status  05/19/2019 79 >59 mL/min/1.73 Final          Passed - Valid encounter within last 6 months    Recent Outpatient Visits           3 months ago Encounter for [employer] annual wellness exam   [place], Neoma Laming B, MD   5 months ago Type 2 diabetes mellitus without  complication, without long-term current use of insulin ([place])   [place] Ladell Pier, MD   8 months ago Need for influenza vaccination   [place], Stephen L, RPH-CPP   11 months ago Type 2 diabetes mellitus without complication, without long-term current use of insulin [facility])   [place], MD   1 year ago Pap smear for cervical cancer screening   [place], MD       Future Appointments             In 4 days Ladell Pier, MD [place]

## 2020-10-28 ENCOUNTER — Ambulatory Visit: Payer: Medicare Other | Admitting: Internal Medicine

## 2020-12-02 ENCOUNTER — Other Ambulatory Visit: Payer: Self-pay | Admitting: Internal Medicine

## 2020-12-02 DIAGNOSIS — I10 Essential (primary) hypertension: Secondary | ICD-10-CM

## 2020-12-02 NOTE — Telephone Encounter (Signed)
Requested Prescriptions  Pending Prescriptions Disp Refills  . lisinopril (ZESTRIL) 30 MG tablet [Pharmacy Med Name: LISINOPRIL 30 MG TABLET] 90 tablet 0    Sig: TAKE 1 TABLET BY MOUTH EVERY DAY     Cardiovascular:  ACE Inhibitors Failed - 12/02/2020 12:06 AM      Failed - Cr in normal range and within 180 days    Creat  Date Value Ref Range Status  04/04/2016 1.11 (H) 0.50 - 0.99 mg/dL Final    Comment:      For patients > or = 66 years of age: The upper reference limit for Creatinine is approximately 13% higher for people identified as African-American.      Creatinine, Ser  Date Value Ref Range Status  05/19/2019 0.79 0.57 - 1.00 mg/dL Final   Creatinine, Urine  Date Value Ref Range Status  04/04/2016 43 20 - 320 mg/dL Final         Failed - K in normal range and within 180 days    Potassium  Date Value Ref Range Status  05/19/2019 4.2 3.5 - 5.2 mmol/L Final         Failed - Last BP in normal range    BP Readings from Last 1 Encounters:  08/28/20 (!) 148/72         Failed - Valid encounter within last 6 months    Recent Outpatient Visits          5 months ago Encounter for [employer] annual wellness exam   [facility] [place], Gavin Pound B, MD   6 months ago Type 2 diabetes mellitus without complication, without long-term current use of insulin (HCC)   [facility] Marcine Matar, MD   10 months ago Need for influenza vaccination   [facility] Drucilla Chalet, RPH-CPP   1 year ago Type 2 diabetes mellitus without complication, without long-term current use of insulin [facility])    [facility] Marcine Matar, MD   1 year ago Pap smear for cervical cancer screening   [facility] Marcine Matar, MD      Future Appointments            In 4 weeks Marcine Matar, MD [facility]            Passed - Patient is not pregnant

## 2020-12-31 ENCOUNTER — Encounter: Payer: Self-pay | Admitting: Internal Medicine

## 2020-12-31 ENCOUNTER — Ambulatory Visit: Payer: Medicare Other | Attending: Internal Medicine | Admitting: Internal Medicine

## 2020-12-31 ENCOUNTER — Other Ambulatory Visit: Payer: Self-pay

## 2020-12-31 VITALS — BP 128/83 | HR 96 | Resp 16 | Wt 181.6 lb

## 2020-12-31 DIAGNOSIS — F17200 Nicotine dependence, unspecified, uncomplicated: Secondary | ICD-10-CM

## 2020-12-31 DIAGNOSIS — E1169 Type 2 diabetes mellitus with other specified complication: Secondary | ICD-10-CM | POA: Diagnosis not present

## 2020-12-31 DIAGNOSIS — E785 Hyperlipidemia, unspecified: Secondary | ICD-10-CM

## 2020-12-31 DIAGNOSIS — I152 Hypertension secondary to endocrine disorders: Secondary | ICD-10-CM | POA: Diagnosis not present

## 2020-12-31 DIAGNOSIS — E119 Type 2 diabetes mellitus without complications: Secondary | ICD-10-CM | POA: Diagnosis not present

## 2020-12-31 DIAGNOSIS — F17210 Nicotine dependence, cigarettes, uncomplicated: Secondary | ICD-10-CM | POA: Diagnosis not present

## 2020-12-31 DIAGNOSIS — E1159 Type 2 diabetes mellitus with other circulatory complications: Secondary | ICD-10-CM | POA: Diagnosis not present

## 2020-12-31 DIAGNOSIS — I10 Essential (primary) hypertension: Secondary | ICD-10-CM | POA: Diagnosis not present

## 2020-12-31 LAB — POCT GLYCOSYLATED HEMOGLOBIN (HGB A1C): HbA1c, POC (controlled diabetic range): 7.4 % — AB (ref 0.0–7.0)

## 2020-12-31 LAB — GLUCOSE, POCT (MANUAL RESULT ENTRY): POC Glucose: 136 mg/dl — AB (ref 70–99)

## 2020-12-31 NOTE — Progress Notes (Signed)
Patient ID: Stephanie Neal, female    DOB: [birth year]  MRN: [id number]  CC: Follow-up (Patient has no question or concerns for Doctor.)   Subjective: Stephanie Neal is a 65 y.o. female who presents for chronic ds management Her concerns today include:  Hx of DM, HTN, tob, obesity, HL.  DIABETES TYPE 2 Last A1C:   Results for orders placed or performed in visit on 12/31/20  POCT glycosylated hemoglobin (Hb A1C)  Result Value Ref Range   Hemoglobin A1C     HbA1c POC (<> result, manual entry)     HbA1c, POC (prediabetic range)     HbA1c, POC (controlled diabetic range) 7.4 (A) 0.0 - 7.0 %  POCT glucose (manual entry)  Result Value Ref Range   POC Glucose 136 (A) 70 - 99 mg/dl    Med Adherence:  [x]  Yes - not taking [place].  Taking metformin and Amaryl twice a day as prescribed.  Her A1c has improved by almost 2 points since last visit. Medication side effects:  []  Yes    [x]  No Home Monitoring?  []  Yes    [x]  No Home glucose results range:NA Diet Adherence: [x]  Yes    []  No Exercise: [x]  Yes -walks around her building 3x/wk   []  No Hypoglycemic episodes?: []  Yes    []  No Numbness of the feet? Some tingling in toes at times Retinopathy hx? []  Yes    []  No Last eye exam:  over due.  She is agreeable to referral. Comments:   HYPERTENSION Currently taking: see medication list.  She is on lisinopril and amlodipine Med Adherence: [x]  Yes    []  No Medication side effects: []  Yes    [x]  No Adherence with salt restriction: [x]  Yes    []  No Home Monitoring?: []  Yes    [x]  No Monitoring Frequency:  Home BP results range:  SOB? []  Yes    [x]  No Chest Pain?: []  Yes    [x]  No Leg swelling?: []  Yes    [x]  No Headaches?: []  Yes    [x]  No Dizziness? []  Yes    [x]  No Comments:   HL:  taking and tolerating Lipitor  Tob dep: She states that she is smoking less because she is keeping her grandbaby more.  She is down to 1 pkQ 4 days.  Hopes to quit eventually.  Reports that  her knees are doing much better with the diclofenac gel.  HM:  defers flu shot today  Patient Active Problem List   Diagnosis Date Noted   Tetanus, diphtheria, and acellular pertussis (Tdap) vaccination declined 05/10/2020   Hyperlipidemia associated with type 2 diabetes mellitus ([place]) 11/18/2019   Chronic pain of right knee 11/18/2019   Mixed hyperlipidemia 06/25/2018   Over weight 06/25/2018   Stress due to family tension 11/13/2017   DM type 2 (diabetes mellitus, type 2) ([place]) 03/05/2014   HYPERCHOLESTEROLEMIA 03/12/2007   OBESITY 03/12/2007   Tobacco dependence 03/12/2007   HTN (hypertension) 03/12/2007   ALLERGIC RHINITIS, SEASONAL 03/12/2007     Current Outpatient Medications on File Prior to Visit  Medication Sig Dispense Refill   amLODipine (NORVASC) 10 MG tablet TAKE 1 TABLET BY MOUTH EVERY DAY 90 tablet 0   atorvastatin (LIPITOR) 40 MG tablet TAKE 1 TABLET BY MOUTH EVERY DAY 90 tablet 1   Blood Glucose Monitoring Suppl (ACCU-CHEK GUIDE ME) w/Device KIT Use as directed 1 kit 0   glimepiride (AMARYL) 2 MG tablet Take 1 tablet (2 mg  total) by mouth 2 (two) times daily. 180 tablet 1   glucose blood (ACCU-CHEK GUIDE) test strip Use as instructed 100 each 12   Insulin Pen Needle (PEN NEEDLES) 31G X 8 MM MISC UAD 100 each 6   lisinopril (ZESTRIL) 30 MG tablet TAKE 1 TABLET BY MOUTH EVERY DAY 90 tablet 0   metFORMIN (GLUCOPHAGE) 1000 MG tablet TAKE 1 TABLET BY MOUTH 2 (TWO) TIMES DAILY WITH A MEAL. FOR DIABETES 180 tablet 1   nicotine polacrilex (NICORETTE) 2 MG gum Take 1 each (2 mg total) by mouth as needed for smoking cessation. 100 tablet 0   albuterol (PROVENTIL HFA;VENTOLIN HFA) 108 (90 Base) MCG/ACT inhaler Inhale 1-2 puffs into the lungs every 6 (six) hours as needed for wheezing or shortness of breath. (Patient not taking: Reported on 12/31/2020) 1 Inhaler 0   [employer] ASPIRIN ADULT LOW DOSE 81 MG chewable tablet CHEW 1 TABLET (81 MG TOTAL) BY MOUTH DAILY. (Patient not taking:  Reported on 12/31/2020) 100 tablet 2   Lancets Misc. (ACCU-CHEK FASTCLIX LANCET) KIT Use as directed (Patient not taking: Reported on 12/31/2020) 1 kit 6   No current facility-administered medications on file prior to visit.    Allergies  Allergen Reactions   Penicillins Other (See Comments)    "blanked out" Has patient had a PCN reaction causing immediate rash, facial/tongue/throat swelling, SOB or lightheadedness with hypotension: No Has patient had a PCN reaction causing severe rash involving mucus membranes or skin necrosis: No Has patient had a PCN reaction that required hospitalization No Has patient had a PCN reaction occurring within the last 10 years: No If all of the above answers are "NO", then may proceed with Cephalosporin use.     Social History   Socioeconomic History   Marital status: Single    Spouse name: Not on file   Number of children: Not on file   Years of education: Not on file   Highest education level: Not on file  Occupational History   Not on file  Tobacco Use   Smoking status: Every Day    Packs/day: 0.25    Types: Cigarettes   Smokeless tobacco: Never  Substance and Sexual Activity   Alcohol use: No   Drug use: No   Sexual activity: Not on file  Other Topics Concern   Not on file  Social History Narrative   Not on file   Social Determinants of Health   Financial Resource Strain: Not on file  Food Insecurity: Not on file  Transportation Needs: Not on file  Physical Activity: Not on file  Stress: Not on file  Social Connections: Not on file  Intimate Partner Violence: Not on file    Family History  Problem Relation Age of Onset   Hypertension Mother    Diabetes Mother    COPD Father    Heart disease Father    Diabetes Father     No past surgical history on file.  ROS: Review of Systems Negative except as stated above  PHYSICAL EXAM: BP 128/83   Pulse 96   Resp 16   Wt 181 lb 9.6 oz (82.4 kg)   SpO2 94%   BMI 25.33 kg/m    Physical Exam  General appearance - alert, well appearing, older African-American female and in no distress Mental status - normal mood, behavior, speech, dress, motor activity, and thought processes Neck - supple, no significant adenopathy Chest - clear to auscultation, no wheezes, rales or rhonchi, symmetric air entry Heart -  normal rate, regular rhythm, normal S1, S2, no murmurs, rubs, clicks or gallops Extremities - peripheral pulses normal, no pedal edema, no clubbing or cyanosis   CMP Latest Ref Rng & Units 05/19/2019 06/25/2018 05/24/2017  Glucose 65 - 99 mg/dL 163(H) 83 128(H)  BUN 8 - 27 mg/dL $Remove'10 11 9  'niaTTxJ$ Creatinine 0.57 - 1.00 mg/dL 0.79 0.75 0.94  Sodium 134 - 144 mmol/L 139 142 142  Potassium 3.5 - 5.2 mmol/L 4.2 4.1 4.4  Chloride 96 - 106 mmol/L 103 104 102  CO2 20 - 29 mmol/L $RemoveB'23 23 26  'owFDwbPp$ Calcium 8.7 - 10.3 mg/dL 9.2 9.3 9.6  Total Protein 6.0 - 8.5 g/dL 6.2 6.2 6.3  Total Bilirubin 0.0 - 1.2 mg/dL <0.2 <0.2 0.2  Alkaline Phos 39 - 117 IU/L 64 67 61  AST 0 - 40 IU/L $Remov'11 11 13  'iRhkUm$ ALT 0 - 32 IU/L $Remov'7 8 8   'WsmILE$ Lipid Panel     Component Value Date/Time   CHOL 162 05/19/2019 1023   TRIG 133 05/19/2019 1023   HDL 41 05/19/2019 1023   CHOLHDL 4.0 05/19/2019 1023   CHOLHDL 5.6 (H) 06/09/2015 1023   VLDL 22 06/09/2015 1023   LDLCALC 97 05/19/2019 1023    CBC    Component Value Date/Time   WBC 5.8 05/19/2019 1023   WBC 5.4 09/08/2013 0859   RBC 4.82 05/19/2019 1023   RBC 4.68 09/08/2013 0859   HGB 13.4 05/19/2019 1023   HCT 38.8 05/19/2019 1023   PLT 228 05/19/2019 1023   MCV 81 05/19/2019 1023   MCH 27.8 05/19/2019 1023   MCH 27.8 09/08/2013 0859   MCHC 34.5 05/19/2019 1023   MCHC 33.9 09/08/2013 0859   RDW 13.9 05/19/2019 1023   LYMPHSABS 1.7 01/05/2011 1513   MONOABS 0.3 01/05/2011 1513   EOSABS 0.1 01/05/2011 1513   BASOSABS 0.0 01/05/2011 1513    ASSESSMENT AND PLAN: 1. Type 2 diabetes mellitus with hyperlipidemia (HCC) Clinically improved with lower A1c.  She  will continue with metformin and Amaryl.  Encouraged her to continue healthy eating habits and try to move as much as she can - Ambulatory referral to Ophthalmology - CBC - Comprehensive metabolic panel - Lipid panel - Microalbumin / creatinine urine ratio  2. Hypertension associated with diabetes ([place]) Close to goal.  Continue lisinopril and Norvasc.  Continue low-salt diet. - POCT glycosylated hemoglobin (Hb A1C) - POCT glucose (manual entry)  3. Tobacco dependence Continue to encourage her to quit.  She has been decreasing the amount that she smokes.  Encouraged her to set a quit date.     Patient was given the opportunity to ask questions.  Patient verbalized understanding of the plan and was able to repeat key elements of the plan.   Orders Placed This Encounter  Procedures   CBC   Comprehensive metabolic panel   Lipid panel   Microalbumin / creatinine urine ratio   Ambulatory referral to Ophthalmology   POCT glycosylated hemoglobin (Hb A1C)   POCT glucose (manual entry)     Requested Prescriptions    No prescriptions requested or ordered in this encounter    Return in about 4 months (around 05/02/2021).  Karle Plumber, MD, FACP

## 2021-01-01 LAB — LIPID PANEL
Chol/HDL Ratio: 3.8 ratio (ref 0.0–4.4)
Cholesterol, Total: 165 mg/dL (ref 100–199)
HDL: 44 mg/dL (ref >39)
LDL Chol Calc (NIH): 105 mg/dL — ABNORMAL HIGH (ref 0–99)
Triglycerides: 87 mg/dL (ref 0–149)
VLDL Cholesterol Cal: 16 mg/dL (ref 5–40)

## 2021-01-01 LAB — COMPREHENSIVE METABOLIC PANEL
ALT: 11 IU/L (ref 0–32)
AST: 14 IU/L (ref 0–40)
Albumin/Globulin Ratio: 1.7 (ref 1.2–2.2)
Albumin: 4 g/dL (ref 3.8–4.8)
Alkaline Phosphatase: 59 IU/L (ref 44–121)
BUN/Creatinine Ratio: 13 (ref 12–28)
BUN: 10 mg/dL (ref 8–27)
Bilirubin Total: 0.2 mg/dL (ref 0.0–1.2)
CO2: 22 mmol/L (ref 20–29)
Calcium: 9.5 mg/dL (ref 8.7–10.3)
Chloride: 106 mmol/L (ref 96–106)
Creatinine, Ser: 0.79 mg/dL (ref 0.57–1.00)
Globulin, Total: 2.4 g/dL (ref 1.5–4.5)
Glucose: 84 mg/dL (ref 65–99)
Potassium: 4 mmol/L (ref 3.5–5.2)
Sodium: 142 mmol/L (ref 134–144)
Total Protein: 6.4 g/dL (ref 6.0–8.5)
eGFR: 82 mL/min/{1.73_m2} (ref >59)

## 2021-01-01 LAB — MICROALBUMIN / CREATININE URINE RATIO
Creatinine, Urine: 42.2 mg/dL
Microalb/Creat Ratio: 18 mg/g creat (ref 0–29)
Microalbumin, Urine: 7.7 ug/mL

## 2021-01-01 LAB — CBC
Hematocrit: 39.8 % (ref 34.0–46.6)
Hemoglobin: 13.4 g/dL (ref 11.1–15.9)
MCH: 27.5 pg (ref 26.6–33.0)
MCHC: 33.7 g/dL (ref 31.5–35.7)
MCV: 82 fL (ref 79–97)
Platelets: 238 10*3/uL (ref 150–450)
RBC: 4.88 x10E6/uL (ref 3.77–5.28)
RDW: 13.8 % (ref 11.7–15.4)
WBC: 7.2 10*3/uL (ref 3.4–10.8)

## 2021-01-01 NOTE — Progress Notes (Signed)
There is no abnormal amounts of protein in the urine. Blood cell counts are normal. Kidney and liver function tests are normal LDL cholesterol is 105 with goal being less than 70.  Please take the atorvastatin as prescribed.

## 2021-01-05 ENCOUNTER — Telehealth: Payer: Self-pay

## 2021-01-05 NOTE — Telephone Encounter (Signed)
Contacted pt to go over lab results pt is aware and doesn't have any questions or concerns 

## 2021-01-20 ENCOUNTER — Other Ambulatory Visit: Payer: Self-pay | Admitting: Internal Medicine

## 2021-01-20 DIAGNOSIS — E119 Type 2 diabetes mellitus without complications: Secondary | ICD-10-CM

## 2021-01-20 DIAGNOSIS — E782 Mixed hyperlipidemia: Secondary | ICD-10-CM

## 2021-01-25 ENCOUNTER — Ambulatory Visit: Payer: Medicare Other | Attending: Internal Medicine

## 2021-01-25 ENCOUNTER — Other Ambulatory Visit: Payer: Self-pay

## 2021-01-25 DIAGNOSIS — Z23 Encounter for immunization: Secondary | ICD-10-CM | POA: Diagnosis not present

## 2021-02-23 ENCOUNTER — Other Ambulatory Visit: Payer: Self-pay

## 2021-02-23 ENCOUNTER — Ambulatory Visit (HOSPITAL_COMMUNITY)
Admission: EM | Admit: 2021-02-23 | Discharge: 2021-02-23 | Disposition: A | Discharge status: Home / Self Care | Payer: Medicare Other | Attending: Internal Medicine | Admitting: Internal Medicine

## 2021-02-23 ENCOUNTER — Ambulatory Visit (INDEPENDENT_AMBULATORY_CARE_PROVIDER_SITE_OTHER): Payer: Medicare Other

## 2021-02-23 ENCOUNTER — Encounter (HOSPITAL_COMMUNITY): Payer: Self-pay

## 2021-02-23 DIAGNOSIS — Z20822 Contact with and (suspected) exposure to covid-19: Secondary | ICD-10-CM | POA: Diagnosis not present

## 2021-02-23 DIAGNOSIS — Z7984 Long term (current) use of oral hypoglycemic drugs: Secondary | ICD-10-CM | POA: Insufficient documentation

## 2021-02-23 DIAGNOSIS — Z880 Allergy status to penicillin: Secondary | ICD-10-CM | POA: Diagnosis not present

## 2021-02-23 DIAGNOSIS — R0789 Other chest pain: Secondary | ICD-10-CM

## 2021-02-23 DIAGNOSIS — I10 Essential (primary) hypertension: Secondary | ICD-10-CM | POA: Insufficient documentation

## 2021-02-23 DIAGNOSIS — F17210 Nicotine dependence, cigarettes, uncomplicated: Secondary | ICD-10-CM | POA: Diagnosis not present

## 2021-02-23 DIAGNOSIS — R0989 Other specified symptoms and signs involving the circulatory and respiratory systems: Secondary | ICD-10-CM | POA: Diagnosis not present

## 2021-02-23 DIAGNOSIS — I700 Atherosclerosis of aorta: Secondary | ICD-10-CM | POA: Diagnosis not present

## 2021-02-23 DIAGNOSIS — J208 Acute bronchitis due to other specified organisms: Secondary | ICD-10-CM | POA: Diagnosis not present

## 2021-02-23 DIAGNOSIS — Z79899 Other long term (current) drug therapy: Secondary | ICD-10-CM | POA: Insufficient documentation

## 2021-02-23 DIAGNOSIS — Z794 Long term (current) use of insulin: Secondary | ICD-10-CM | POA: Diagnosis not present

## 2021-02-23 LAB — POC INFLUENZA A AND B ANTIGEN (URGENT CARE ONLY)
INFLUENZA A ANTIGEN, POC: NEGATIVE
INFLUENZA B ANTIGEN, POC: NEGATIVE

## 2021-02-23 LAB — SARS CORONAVIRUS 2 (TAT 6-24 HRS): SARS Coronavirus 2: NEGATIVE

## 2021-02-23 NOTE — ED Triage Notes (Signed)
Pt c/o chest congestion x2 days. Denies cough or nasal congestion. States her grandbabies has RSV.

## 2021-02-23 NOTE — Discharge Instructions (Signed)
You likely have a viral bronchitis.  You do not have any signs or symptoms suggestive of pneumonia and your chest x-ray looked normal.  You can continue over-the-counter treatments including Mucinex, but do not use anything with DM in the name as this can raise your blood pressure.  Make sure that you stay well-hydrated.  If you develop difficulty breathing, you should be seen at the emergency room right away.  This should improve over the next few days, most viruses last 5 to 7 days.  If you are not improving, you should follow-up with your regular doctor.  Humidifiers can also help with your symptoms.  We have tested you for COVID and will contact you if this comes back positive, usually this comes back the next day.

## 2021-02-23 NOTE — ED Provider Notes (Signed)
[facility]    CSN: [id number] Arrival date & time: 02/23/21  0944      History   Chief Complaint Chief Complaint  Patient presents with   chest congestion    HPI Stephanie Neal is a 65 y.o. female.   Chest congestion Has been occurring for the last 3 days No fevers or chills No sinus congestion, cough, sore throat Denies any difficulty breathing Reports that she is a smoker Feels that her voice is more raspy She has been able to drink some water and coughed up some mucus Has a grandchild that is currently in the hospital with RSV who she cares for regularly and they were sick last week when she saw them Has otherwise been feeling well She has been using Vicks on her chest   Past Medical History:  Diagnosis Date   Diabetes mellitus    Hyperlipidemia    Hypertension     Patient Active Problem List   Diagnosis Date Noted   Tetanus, diphtheria, and acellular pertussis (Tdap) vaccination declined 05/10/2020   Hyperlipidemia associated with type 2 diabetes mellitus (HCC) 11/18/2019   Chronic pain of right knee 11/18/2019   Mixed hyperlipidemia 06/25/2018   Over weight 06/25/2018   Stress due to family tension 11/13/2017   DM type 2 (diabetes mellitus, type 2) (HCC) 03/05/2014   HYPERCHOLESTEROLEMIA 03/12/2007   OBESITY 03/12/2007   Tobacco dependence 03/12/2007   HTN (hypertension) 03/12/2007   ALLERGIC RHINITIS, SEASONAL 03/12/2007    History reviewed. No pertinent surgical history.  OB History   No obstetric history on file.      Home Medications    Prior to Admission medications   Medication Sig Start Date End Date Taking? Authorizing Provider  amLODipine (NORVASC) 10 MG tablet TAKE 1 TABLET BY MOUTH EVERY DAY 12/02/20   Marcine Matar, MD  atorvastatin (LIPITOR) 40 MG tablet TAKE 1 TABLET BY MOUTH EVERY DAY 01/20/21   Marcine Matar, MD  Blood Glucose Monitoring Suppl (ACCU-CHEK GUIDE ME) w/Device KIT Use as directed  11/18/19   Marcine Matar, MD  glimepiride (AMARYL) 2 MG tablet TAKE 1 TABLET BY MOUTH 2 TIMES DAILY. 01/20/21   Marcine Matar, MD  glucose blood (ACCU-CHEK GUIDE) test strip Use as instructed 05/10/20   Marcine Matar, MD  Insulin Pen Needle (PEN NEEDLES) 31G X 8 MM MISC UAD 05/10/20   Marcine Matar, MD  Lancets Misc. (ACCU-CHEK FASTCLIX LANCET) KIT Use as directed Patient not taking: Reported on 12/31/2020 05/10/20   Marcine Matar, MD  lisinopril (ZESTRIL) 30 MG tablet TAKE 1 TABLET BY MOUTH EVERY DAY 12/02/20   Marcine Matar, MD  metFORMIN (GLUCOPHAGE) 1000 MG tablet TAKE 1 TABLET BY MOUTH 2 (TWO) TIMES DAILY WITH A MEAL. FOR DIABETES 10/25/20   Marcine Matar, MD  nicotine polacrilex (NICORETTE) 2 MG gum Take 1 each (2 mg total) by mouth as needed for smoking cessation. 06/29/20   Marcine Matar, MD    Family History Family History  Problem Relation Age of Onset   Hypertension Mother    Diabetes Mother    COPD Father    Heart disease Father    Diabetes Father     Social History Social History   Tobacco Use   Smoking status: Every Day    Packs/day: 0.25    Types: Cigarettes   Smokeless tobacco: Never  Substance Use Topics   Alcohol use: No   Drug use: No  Allergies   Penicillins   Review of Systems Review of Systems  All other systems reviewed and are negative.  Per HPI Physical Exam Triage Vital Signs ED Triage Vitals  Enc Vitals Group     BP 02/23/21 1035 (!) 157/103     Pulse Rate 02/23/21 1035 96     Resp 02/23/21 1035 18     Temp 02/23/21 1035 98.4 F (36.9 C)     Temp Source 02/23/21 1035 Oral     SpO2 02/23/21 1035 96 %     Weight --      Height --      Head Circumference --      Peak Flow --      Pain Score 02/23/21 1036 0     Pain Loc --      Pain Edu? --      Excl. in [place]? --    No data found.  Updated Vital Signs BP (!) 157/103 (BP Location: Left Arm)   Pulse 96   Temp 98.4 F (36.9 C) (Oral)   Resp 18    SpO2 96%   Visual Acuity Right Eye Distance:   Left Eye Distance:   Bilateral Distance:    Right Eye Near:   Left Eye Near:    Bilateral Near:     Physical Exam Constitutional:      General: She is not in acute distress.    Appearance: Normal appearance. She is not ill-appearing or toxic-appearing.  HENT:     Head: Normocephalic and atraumatic.     Nose: Nose normal.     Mouth/Throat:     Mouth: Mucous membranes are moist.     Pharynx: Oropharynx is clear. No oropharyngeal exudate or posterior oropharyngeal erythema.  Eyes:     Conjunctiva/sclera: Conjunctivae normal.  Cardiovascular:     Rate and Rhythm: Normal rate and regular rhythm.  Pulmonary:     Effort: Pulmonary effort is normal. No respiratory distress.     Breath sounds: Normal breath sounds. No wheezing, rhonchi or rales.  Musculoskeletal:     Cervical back: Normal range of motion and neck supple. No rigidity or tenderness.  Lymphadenopathy:     Cervical: No cervical adenopathy.  Skin:    General: Skin is warm and dry.  Neurological:     General: No focal deficit present.     Mental Status: She is alert and oriented to person, place, and time.     UC Treatments / Results  Labs (all labs ordered are listed, but only abnormal results are displayed) Labs Reviewed  SARS CORONAVIRUS 2 (TAT 6-24 HRS)  POC INFLUENZA A AND B ANTIGEN (URGENT CARE ONLY)    EKG   Radiology DG Chest 2 View  Result Date: 02/23/2021 CLINICAL DATA:  65 year old female with history of chest congestion for the past 3 days. EXAM: CHEST - 2 VIEW COMPARISON:  Chest x-ray 08/04/2015. FINDINGS: Lung volumes are normal. No consolidative airspace disease. No pleural effusions. No pneumothorax. No pulmonary nodule or mass noted. Pulmonary vasculature and the cardiomediastinal silhouette are within normal limits. Atherosclerotic calcifications in the thoracic aorta. IMPRESSION: 1.  No radiographic evidence of acute cardiopulmonary disease. 2.  Aortic atherosclerosis. Electronically Signed   By: Vinnie Langton M.D.   On: 02/23/2021 11:32    Procedures Procedures (including critical care time)  Medications Ordered in UC Medications - No data to display  Initial Impression / Assessment and Plan / UC Course  I have reviewed the triage vital signs  and the nursing notes.  Pertinent labs & imaging results that were available during my care of the patient were reviewed by me and considered in my medical decision making (see chart for details).     Chest x-ray did not show any evidence of pneumonia.  Vital signs are stable aside from mild hypertension.  Flu test negative, COVID test pending.  Advised supportive care, likely viral bronchitis.  Given ED precautions especially if difficulty breathing.  Recommended follow-up with primary care provider if not improving over the next week.   Final Clinical Impressions(s) / UC Diagnoses   Final diagnoses:  Viral bronchitis     Discharge Instructions      You likely have a viral bronchitis.  You do not have any signs or symptoms suggestive of pneumonia and your chest x-ray looked normal.  You can continue over-the-counter treatments including Mucinex, but do not use anything with DM in the name as this can raise your blood pressure.  Make sure that you stay well-hydrated.  If you develop difficulty breathing, you should be seen at the emergency room right away.  This should improve over the next few days, most viruses last 5 to 7 days.  If you are not improving, you should follow-up with your regular doctor.  Humidifiers can also help with your symptoms.  We have tested you for COVID and will contact you if this comes back positive, usually this comes back the next day.     ED Prescriptions   None    PDMP not reviewed this encounter.   [place], DO 02/23/21 1139

## 2021-02-27 ENCOUNTER — Other Ambulatory Visit: Payer: Self-pay | Admitting: Internal Medicine

## 2021-02-27 DIAGNOSIS — I10 Essential (primary) hypertension: Secondary | ICD-10-CM

## 2021-02-27 NOTE — Telephone Encounter (Signed)
Requested Prescriptions  Pending Prescriptions Disp Refills  . lisinopril (ZESTRIL) 30 MG tablet [Pharmacy Med Name: LISINOPRIL 30 MG TABLET] 90 tablet 0    Sig: TAKE 1 TABLET BY MOUTH EVERY DAY     Cardiovascular:  ACE Inhibitors Failed - 02/27/2021 12:08 AM      Failed - Last BP in normal range    BP Readings from Last 1 Encounters:  02/23/21 (!) 157/103         Passed - Cr in normal range and within 180 days    Creat  Date Value Ref Range Status  04/04/2016 1.11 (H) 0.50 - 0.99 mg/dL Final    Comment:      For patients > or = 66 years of age: The upper reference limit for Creatinine is approximately 13% higher for people identified as African-American.      Creatinine, Ser  Date Value Ref Range Status  12/31/2020 0.79 0.57 - 1.00 mg/dL Final   Creatinine, Urine  Date Value Ref Range Status  04/04/2016 43 20 - 320 mg/dL Final         Passed - K in normal range and within 180 days    Potassium  Date Value Ref Range Status  12/31/2020 4.0 3.5 - 5.2 mmol/L Final         Passed - Patient is not pregnant      Passed - Valid encounter within last 6 months    Recent Outpatient Visits          1 month ago Type 2 diabetes mellitus with hyperlipidemia (HCC)   [facility] Marcine Matar, MD   8 months ago Encounter for [employer] annual wellness exam   [facility] [place], Gavin Pound B, MD   9 months ago Type 2 diabetes mellitus without complication, without long-term current use of insulin [facility])   [place] [facility] Marcine Matar, MD   1 year ago Need for influenza vaccination   [facility] Drucilla Chalet, RPH-CPP   1 year ago Type 2 diabetes mellitus without complication, without long-term current use of insulin [facility])   [place] [facility] Marcine Matar, MD      Future Appointments            In 2 months Laural Benes,  Binnie Rail, MD [facility]           . amLODipine (NORVASC) 10 MG tablet [Pharmacy Med Name: AMLODIPINE BESYLATE 10 MG TAB] 90 tablet 0    Sig: TAKE 1 TABLET BY MOUTH EVERY DAY     Cardiovascular:  Calcium Channel Blockers Failed - 02/27/2021 12:08 AM      Failed - Last BP in normal range    BP Readings from Last 1 Encounters:  02/23/21 (!) 157/103         Passed - Valid encounter within last 6 months    Recent Outpatient Visits          1 month ago Type 2 diabetes mellitus with hyperlipidemia [facility])   [facility] Marcine Matar, MD   8 months ago Encounter for [employer] annual wellness exam   [facility] [place], Gavin Pound B, MD   9 months ago Type 2 diabetes mellitus without complication, without long-term current use of insulin [facility])   [facility] [place], Gavin Pound  B, MD   1 year ago Need for influenza vaccination   [facility] [place], Cornelius Moras, RPH-CPP   1 year ago Type 2 diabetes mellitus without complication, without long-term current use of insulin [facility])   [place] [facility] Marcine Matar, MD      Future Appointments            In 2 months Laural Benes Binnie Rail, MD [facility]

## 2021-04-26 ENCOUNTER — Other Ambulatory Visit: Payer: Self-pay | Admitting: Internal Medicine

## 2021-04-26 DIAGNOSIS — E119 Type 2 diabetes mellitus without complications: Secondary | ICD-10-CM

## 2021-05-05 ENCOUNTER — Encounter: Payer: Self-pay | Admitting: Internal Medicine

## 2021-05-05 ENCOUNTER — Other Ambulatory Visit: Payer: Self-pay

## 2021-05-05 ENCOUNTER — Ambulatory Visit: Payer: Commercial Managed Care - HMO | Attending: Internal Medicine | Admitting: Internal Medicine

## 2021-05-05 VITALS — BP 120/78 | HR 102 | Resp 16 | Wt 180.8 lb

## 2021-05-05 DIAGNOSIS — E1169 Type 2 diabetes mellitus with other specified complication: Secondary | ICD-10-CM

## 2021-05-05 DIAGNOSIS — F17200 Nicotine dependence, unspecified, uncomplicated: Secondary | ICD-10-CM

## 2021-05-05 DIAGNOSIS — I152 Hypertension secondary to endocrine disorders: Secondary | ICD-10-CM

## 2021-05-05 DIAGNOSIS — F17210 Nicotine dependence, cigarettes, uncomplicated: Secondary | ICD-10-CM | POA: Diagnosis not present

## 2021-05-05 DIAGNOSIS — E785 Hyperlipidemia, unspecified: Secondary | ICD-10-CM

## 2021-05-05 DIAGNOSIS — E1159 Type 2 diabetes mellitus with other circulatory complications: Secondary | ICD-10-CM

## 2021-05-05 DIAGNOSIS — R053 Chronic cough: Secondary | ICD-10-CM | POA: Diagnosis not present

## 2021-05-05 DIAGNOSIS — R490 Dysphonia: Secondary | ICD-10-CM

## 2021-05-05 DIAGNOSIS — I700 Atherosclerosis of aorta: Secondary | ICD-10-CM | POA: Insufficient documentation

## 2021-05-05 LAB — GLUCOSE, POCT (MANUAL RESULT ENTRY): POC Glucose: 144 mg/dl — AB (ref 70–99)

## 2021-05-05 LAB — POCT GLYCOSYLATED HEMOGLOBIN (HGB A1C): HbA1c, POC (controlled diabetic range): 7.6 % — AB (ref 0.0–7.0)

## 2021-05-05 MED ORDER — ACCU-CHEK FASTCLIX LANCET KIT: PACK | 6 refills | Status: DC

## 2021-05-05 MED ORDER — ACCU-CHEK GUIDE ME W/DEVICE KIT: PACK | 0 refills | Status: DC

## 2021-05-05 MED ORDER — GLIMEPIRIDE 2 MG PO TABS: ORAL_TABLET | ORAL | 1 refills | Status: DC

## 2021-05-05 MED ORDER — AZITHROMYCIN 250 MG PO TABS: ORAL_TABLET | ORAL | 0 refills | Status: DC

## 2021-05-05 MED ORDER — ACCU-CHEK GUIDE VI STRP: ORAL_STRIP | 12 refills | Status: DC

## 2021-05-05 NOTE — Progress Notes (Signed)
Patient ID: Stephanie Neal, female    DOB: 03-02-1956  MRN: [id number]  CC: Bronchitis, Diabetes, and Hypertension   Subjective: Stephanie Neal is a 66 y.o. female who presents for chronic ds management Her concerns today include:  Hx of DM, HTN, tob, obesity, HL.  C/o chest congestion since end of October.  Seen in UC at that time with chest congestion and raspy voice.  Flu and COVID test negative.  CXR negative except for incidental atherosclerosis No fever or SOB.  Denies cough but "I would make myself cough to bring up phlegm." Mucous is light green in color. Feels like "cold blocked up"  in chest.  Wants something to help get mucus up. Using Mucinex.  She has noted hoarseness. No sore throat. No dysphagia, post nasal  drip or GERD symptoms. -still smoking but not as much since current symptoms.  She is smoked since the age of 33.  At her heaviest, she was smoking about 3/4 of a pack a day.  Last smoked 2 days after thanksgiving.  Tries to insist that her significant other not smoke around her.    DIABETES TYPE 2 Last A1C:   Results for orders placed or performed in visit on 05/05/21  POCT glucose (manual entry)  Result Value Ref Range   POC Glucose 144 (A) 70 - 99 mg/dl  POCT glycosylated hemoglobin (Hb A1C)  Result Value Ref Range   Hemoglobin A1C     HbA1c POC (<> result, manual entry)     HbA1c, POC (prediabetic range)     HbA1c, POC (controlled diabetic range) 7.6 (A) 0.0 - 7.0 %    Med Adherence:  [x]  Yes  -Amaryl and Metformin  []  No Medication side effects:  []  Yes    [x]  No Home Monitoring?  []  Yes    [x]  No.  I had sent testing supplies for her a year ago but patient states she never got the supplies.  She would like me to send new prescription. Home glucose results range: Diet Adherence: [x]  Yes    []  No Exercise: [x]  Yes -try to move a lot in her house    []  No Hypoglycemic episodes?: []  Yes    [x]  No Numbness of the feet? []  Yes    [x]  No Retinopathy  hx? []  Yes    []  No Last eye exam: referred for eye exam on last visit.  No appt as yet Comments:   HYPERTENSION Currently taking: see medication list.  She is on lisinopril 30 mg and amlodipine 10 mg.  She took her medicines already for today. Med Adherence: [x]  Yes    []  No Medication side effects: []  Yes    [x]  No Adherence with salt restriction: [x]  Yes    []  No Home Monitoring?: []  Yes    [x]  No but does have wrist monitor Monitoring Frequency:  Home BP results range:  SOB? []  Yes    [x]  No Chest Pain?: []  Yes    [x]  No Leg swelling?: []  Yes    [x]  No Headaches?: []  Yes    [x]  No Dizziness? []  Yes    [x]  No Comments:   HL:  taking Lipitor and tolerating Patient Active Problem List   Diagnosis Date Noted   Tetanus, diphtheria, and acellular pertussis (Tdap) vaccination declined 05/10/2020   Hyperlipidemia associated with type 2 diabetes mellitus ([place]) 11/18/2019   Chronic pain of right knee 11/18/2019   Mixed hyperlipidemia 06/25/2018   Over weight 06/25/2018  Stress due to family tension 11/13/2017   DM type 2 (diabetes mellitus, type 2) ([place]) 03/05/2014   HYPERCHOLESTEROLEMIA 03/12/2007   OBESITY 03/12/2007   Tobacco dependence 03/12/2007   HTN (hypertension) 03/12/2007   ALLERGIC RHINITIS, SEASONAL 03/12/2007     Current Outpatient Medications on File Prior to Visit  Medication Sig Dispense Refill   amLODipine (NORVASC) 10 MG tablet TAKE 1 TABLET BY MOUTH EVERY DAY 90 tablet 0   atorvastatin (LIPITOR) 40 MG tablet TAKE 1 TABLET BY MOUTH EVERY DAY 90 tablet 1   Blood Glucose Monitoring Suppl (ACCU-CHEK GUIDE ME) w/Device KIT Use as directed 1 kit 0   glucose blood (ACCU-CHEK GUIDE) test strip Use as instructed 100 each 12   Lancets Misc. (ACCU-CHEK FASTCLIX LANCET) KIT Use as directed (Patient not taking: Reported on 12/31/2020) 1 kit 6   lisinopril (ZESTRIL) 30 MG tablet TAKE 1 TABLET BY MOUTH EVERY DAY 90 tablet 0   metFORMIN (GLUCOPHAGE) 1000 MG tablet TAKE 1  TABLET BY MOUTH 2 (TWO) TIMES DAILY WITH A MEAL. FOR DIABETES 180 tablet 1   nicotine polacrilex (NICORETTE) 2 MG gum Take 1 each (2 mg total) by mouth as needed for smoking cessation. 100 tablet 0   No current facility-administered medications on file prior to visit.    Allergies  Allergen Reactions   Penicillins Other (See Comments)    "blanked out" Has patient had a PCN reaction causing immediate rash, facial/tongue/throat swelling, SOB or lightheadedness with hypotension: No Has patient had a PCN reaction causing severe rash involving mucus membranes or skin necrosis: No Has patient had a PCN reaction that required hospitalization No Has patient had a PCN reaction occurring within the last 10 years: No If all of the above answers are "NO", then may proceed with Cephalosporin use.     Social History   Socioeconomic History   Marital status: Single    Spouse name: Not on file   Number of children: Not on file   Years of education: Not on file   Highest education level: Not on file  Occupational History   Not on file  Tobacco Use   Smoking status: Every Day    Packs/day: 0.25    Types: Cigarettes   Smokeless tobacco: Never  Substance and Sexual Activity   Alcohol use: No   Drug use: No   Sexual activity: Not on file  Other Topics Concern   Not on file  Social History Narrative   Not on file   Social Determinants of Health   Financial Resource Strain: Not on file  Food Insecurity: Not on file  Transportation Needs: Not on file  Physical Activity: Not on file  Stress: Not on file  Social Connections: Not on file  Intimate Partner Violence: Not on file    Family History  Problem Relation Age of Onset   Hypertension Mother    Diabetes Mother    COPD Father    Heart disease Father    Diabetes Father     No past surgical history on file.  ROS: Review of Systems Negative except as stated above  PHYSICAL EXAM: BP 120/78    Pulse (!) 102    Resp 16    Wt  180 lb 12.8 oz (82 kg)    SpO2 96%    BMI 25.22 kg/m   Wt Readings from Last 3 Encounters:  05/05/21 180 lb 12.8 oz (82 kg)  12/31/20 181 lb 9.6 oz (82.4 kg)  06/29/20 190 lb 9.6  oz (86.5 kg)    Physical Exam  General appearance - alert, well appearing, older African-American female and in no distress Mental status - normal mood, behavior, speech, dress, motor activity, and thought processes Nose -mild enlargement of both nasal turbinates. Mouth - mucous membranes moist, pharynx normal without lesions Neck - supple, no significant adenopathy.  No supraclavicular lymphadenopathy. Chest - clear to auscultation, no wheezes, rales or rhonchi, symmetric air entry Heart - normal rate, regular rhythm, normal S1, S2, no murmurs, rubs, clicks or gallops Extremities - peripheral pulses normal, no pedal edema, no clubbing or cyanosis   CMP Latest Ref Rng & Units 12/31/2020 05/19/2019 06/25/2018  Glucose 65 - 99 mg/dL 84 163(H) 83  BUN 8 - 27 mg/dL $Remove'10 10 11  'nZEwrVR$ Creatinine 0.57 - 1.00 mg/dL 0.79 0.79 0.75  Sodium 134 - 144 mmol/L 142 139 142  Potassium 3.5 - 5.2 mmol/L 4.0 4.2 4.1  Chloride 96 - 106 mmol/L 106 103 104  CO2 20 - 29 mmol/L $RemoveB'22 23 23  'wXuKVYVZ$ Calcium 8.7 - 10.3 mg/dL 9.5 9.2 9.3  Total Protein 6.0 - 8.5 g/dL 6.4 6.2 6.2  Total Bilirubin 0.0 - 1.2 mg/dL 0.2 <0.2 <0.2  Alkaline Phos 44 - 121 IU/L 59 64 67  AST 0 - 40 IU/L $Remov'14 11 11  'xiAnkL$ ALT 0 - 32 IU/L $Remov'11 7 8   'yLZRdj$ Lipid Panel     Component Value Date/Time   CHOL 165 12/31/2020 1138   TRIG 87 12/31/2020 1138   HDL 44 12/31/2020 1138   CHOLHDL 3.8 12/31/2020 1138   CHOLHDL 5.6 (H) 06/09/2015 1023   VLDL 22 06/09/2015 1023   LDLCALC 105 (H) 12/31/2020 1138    CBC    Component Value Date/Time   WBC 7.2 12/31/2020 1138   WBC 5.4 09/08/2013 0859   RBC 4.88 12/31/2020 1138   RBC 4.68 09/08/2013 0859   HGB 13.4 12/31/2020 1138   HCT 39.8 12/31/2020 1138   PLT 238 12/31/2020 1138   MCV 82 12/31/2020 1138   MCH 27.5 12/31/2020 1138   MCH 27.8  09/08/2013 0859   MCHC 33.7 12/31/2020 1138   MCHC 33.9 09/08/2013 0859   RDW 13.8 12/31/2020 1138   LYMPHSABS 1.7 01/05/2011 1513   MONOABS 0.3 01/05/2011 1513   EOSABS 0.1 01/05/2011 1513   BASOSABS 0.0 01/05/2011 1513    ASSESSMENT AND PLAN:  1. Type 2 diabetes mellitus with hyperlipidemia (HCC) Not at goal.  Recommend increase Amaryl to 4 mg in the morning and 2 mg in the evening. Prescription sent to pharmacy for diabetic testing supplies.  Advised to check blood sugars at least once a day before breakfast with goal being 90-130. - POCT glucose (manual entry) - POCT glycosylated hemoglobin (Hb A1C) - Ambulatory referral to Ophthalmology - glimepiride (AMARYL) 2 MG tablet; 2 tabs p.o. every morning and 1 tablet p.o. every afternoon  Dispense: 270 tablet; Refill: 1  2. Hoarseness of voice This along with a chronic cough in patients with history of prolonged tobacco use is very concerning.  I recommended that we check thyroid level and refer to ENT.  Also recommended CAT scan of the chest to rule out any lung mass that may be compressing the nerve to her voicebox.  Patient declined referral to ENT and CAT scan stating she thinks it is just a cold and would like to be tried with some antibiotics first.  I told her that we can give antibiotics but strongly recommend that we follow through with the  plan as outlined.  Patient states she will call me to submit the referral and order the CAT scan if the hoarseness persists upon completion of antibiotics. - TSH+T4F+T3Free - CBC  3. Chronic cough See #2 above. Strongly advised to remain free of tobacco. - azithromycin (ZITHROMAX Z-PAK) 250 MG tablet; 2 tabs PO x 1 then 1 tab PO daily  Dispense: 6 each; Refill: 0  4. Tobacco dependence Commended her on having not smoked in about 2 months.  Encouraged her to remain tobacco free.  We have discussed health risks associated with smoking.  5. Hypertension associated with diabetes ([place]) At goal.   Continue lisinopril and Norvasc  6. Aortic atherosclerosis ([place]) Incidental finding on chest x-ray.  Continue Lipitor.   Patient was given the opportunity to ask questions.  Patient verbalized understanding of the plan and was able to repeat key elements of the plan.   Orders Placed This Encounter  Procedures   TSH+T4F+T3Free   CBC   Ambulatory referral to Ophthalmology   POCT glucose (manual entry)   POCT glycosylated hemoglobin (Hb A1C)     Requested Prescriptions   Signed Prescriptions Disp Refills   azithromycin (ZITHROMAX Z-PAK) 250 MG tablet 6 each 0    Sig: 2 tabs PO x 1 then 1 tab PO daily   glimepiride (AMARYL) 2 MG tablet 270 tablet 1    Sig: 2 tabs p.o. every morning and 1 tablet p.o. every afternoon    Return in about 2 months (around 07/03/2021) for AWV.  Karle Plumber, MD, FACP

## 2021-05-05 NOTE — Patient Instructions (Signed)
Increase glimepiride to 2 tablets in the mornings and 1 tablet in the evening. I have sent a prescription to your pharmacy for the antibiotics as we discussed.  Continue using the Mucinex as needed If his hoarseness does not resolve, please call me and let me know so that we can move forward with ordering the CAT scan of your chest and referring you to an ear nose and throat specialist.

## 2021-05-25 ENCOUNTER — Other Ambulatory Visit: Payer: Self-pay | Admitting: Internal Medicine

## 2021-05-25 DIAGNOSIS — I10 Essential (primary) hypertension: Secondary | ICD-10-CM

## 2021-05-25 NOTE — Telephone Encounter (Signed)
Requested Prescriptions  Pending Prescriptions Disp Refills   lisinopril (ZESTRIL) 30 MG tablet [Pharmacy Med Name: LISINOPRIL 30 MG TABLET] 90 tablet 0    Sig: TAKE 1 TABLET BY MOUTH EVERY DAY     Cardiovascular:  ACE Inhibitors Passed - 05/25/2021 12:06 AM      Passed - Cr in normal range and within 180 days    Creat  Date Value Ref Range Status  04/04/2016 1.11 (H) 0.50 - 0.99 mg/dL Final    Comment:      For patients > or = 67 years of age: The upper reference limit for Creatinine is approximately 13% higher for people identified as African-American.      Creatinine, Ser  Date Value Ref Range Status  12/31/2020 0.79 0.57 - 1.00 mg/dL Final   Creatinine, Urine  Date Value Ref Range Status  04/04/2016 43 20 - 320 mg/dL Final         Passed - K in normal range and within 180 days    Potassium  Date Value Ref Range Status  12/31/2020 4.0 3.5 - 5.2 mmol/L Final         Passed - Patient is not pregnant      Passed - Last BP in normal range    BP Readings from Last 1 Encounters:  05/05/21 120/78         Passed - Valid encounter within last 6 months    Recent Outpatient Visits          2 weeks ago Type 2 diabetes mellitus with hyperlipidemia [facility])   [facility] Jonah Blue B, MD   4 months ago Type 2 diabetes mellitus with hyperlipidemia [facility])   [place] Marcine Matar, MD   11 months ago Encounter for [employer] annual wellness exam   [facility] [place], Gavin Pound B, MD   1 year ago Type 2 diabetes mellitus without complication, without long-term current use of insulin (HCC)   [facility] Marcine Matar, MD   1 year ago Need for influenza vaccination   [facility] [place], Jeannett Senior L, RPH-CPP              amLODipine (NORVASC) 10 MG tablet [Pharmacy Med Name: AMLODIPINE BESYLATE 10 MG TAB] 90  tablet 0    Sig: TAKE 1 TABLET BY MOUTH EVERY DAY     Cardiovascular:  Calcium Channel Blockers Passed - 05/25/2021 12:06 AM      Passed - Last BP in normal range    BP Readings from Last 1 Encounters:  05/05/21 120/78         Passed - Valid encounter within last 6 months    Recent Outpatient Visits          2 weeks ago Type 2 diabetes mellitus with hyperlipidemia [facility])   [place] [facility] Jonah Blue B, MD   4 months ago Type 2 diabetes mellitus with hyperlipidemia [facility])   [facility] Marcine Matar, MD   11 months ago Encounter for [employer] annual wellness exam   [facility] [place], Gavin Pound B, MD   1 year ago Type 2 diabetes mellitus without complication, without long-term current use of insulin [facility])    [facility] Marcine Matar, MD   1 year ago Need for influenza vaccination  [facility] [place], Cornelius Moras, RPH-CPP

## 2021-05-30 ENCOUNTER — Other Ambulatory Visit: Payer: Self-pay | Admitting: Internal Medicine

## 2021-05-30 DIAGNOSIS — Z1231 Encounter for screening mammogram for malignant neoplasm of breast: Secondary | ICD-10-CM

## 2021-06-21 ENCOUNTER — Ambulatory Visit: Payer: Commercial Managed Care - HMO

## 2021-06-28 ENCOUNTER — Ambulatory Visit: Payer: Commercial Managed Care - HMO

## 2021-07-11 ENCOUNTER — Other Ambulatory Visit: Payer: Self-pay | Admitting: Internal Medicine

## 2021-07-11 DIAGNOSIS — E782 Mixed hyperlipidemia: Secondary | ICD-10-CM

## 2021-07-11 NOTE — Telephone Encounter (Signed)
Requested Prescriptions  ?Pending Prescriptions Disp Refills  ?? atorvastatin (LIPITOR) 40 MG tablet [Pharmacy Med Name: ATORVASTATIN 40 MG TABLET] 90 tablet 3  ?  Sig: TAKE 1 TABLET BY MOUTH EVERY DAY  ?  ? Cardiovascular:  Antilipid - Statins Failed - 07/11/2021 12:12 AM  ?  ?  Failed - Lipid Panel in normal range within the last 12 months  ?  Cholesterol, Total  ?Date Value Ref Range Status  ?12/31/2020 165 100 - 199 mg/dL Final  ? ?LDL Chol Calc (NIH)  ?Date Value Ref Range Status  ?12/31/2020 105 (H) 0 - 99 mg/dL Final  ? ?HDL  ?Date Value Ref Range Status  ?12/31/2020 44 >39 mg/dL Final  ? ?Triglycerides  ?Date Value Ref Range Status  ?12/31/2020 87 0 - 149 mg/dL Final  ? ?  ?  ?  Passed - Patient is not pregnant  ?  ?  Passed - Valid encounter within last 12 months  ?  Recent Outpatient Visits   ?      ? 2 months ago Type 2 diabetes mellitus with hyperlipidemia (HCC)  ? [facility] Jonah Blue B, MD  ? 6 months ago Type 2 diabetes mellitus with hyperlipidemia [facility])  ? [facility] Marcine Matar, MD  ? 1 year ago Encounter for Medicare annual wellness exam  ? [facility] Marcine Matar, MD  ? 1 year ago Type 2 diabetes mellitus without complication, without long-term current use of insulin (HCC)  ? [facility] Marcine Matar, MD  ? 1 year ago Need for influenza vaccination  ? [facility] Lois Huxley, Cornelius Moras, RPH-CPP  ?  ?  ? ?  ?  ?  ? ?

## 2021-08-16 ENCOUNTER — Ambulatory Visit
Admission: RE | Admit: 2021-08-16 | Discharge: 2021-08-16 | Disposition: A | Discharge status: Home / Self Care | Payer: Medicare Other | Source: Ambulatory Visit | Attending: Internal Medicine | Admitting: Internal Medicine

## 2021-08-16 ENCOUNTER — Telehealth: Payer: Self-pay

## 2021-08-16 DIAGNOSIS — Z1231 Encounter for screening mammogram for malignant neoplasm of breast: Secondary | ICD-10-CM

## 2021-08-16 NOTE — Telephone Encounter (Signed)
Contacted pt to go over mm results pt is aware and doesn't have any questions or concerns  

## 2021-08-22 ENCOUNTER — Other Ambulatory Visit: Payer: Self-pay | Admitting: Internal Medicine

## 2021-08-22 DIAGNOSIS — I10 Essential (primary) hypertension: Secondary | ICD-10-CM

## 2021-08-23 NOTE — Telephone Encounter (Signed)
Requested Prescriptions  ?Pending Prescriptions Disp Refills  ?? lisinopril (ZESTRIL) 30 MG tablet [Pharmacy Med Name: LISINOPRIL 30 MG TABLET] 90 tablet 0  ?  Sig: TAKE 1 TABLET BY MOUTH EVERY DAY  ?  ? Cardiovascular:  ACE Inhibitors Failed - 08/22/2021 12:10 AM  ?  ?  Failed - Cr in normal range and within 180 days  ?  Creat  ?Date Value Ref Range Status  ?04/04/2016 1.11 (H) 0.50 - 0.99 mg/dL Final  ?  Comment:  ?    ?For patients > or = 67 years of age: The upper reference limit for ?Creatinine is approximately 13% higher for people identified as ?African-American. ?  ?  ? ?Creatinine, Ser  ?Date Value Ref Range Status  ?12/31/2020 0.79 0.57 - 1.00 mg/dL Final  ? ?Creatinine, Urine  ?Date Value Ref Range Status  ?04/04/2016 43 20 - 320 mg/dL Final  ?   ?  ?  Failed - K in normal range and within 180 days  ?  Potassium  ?Date Value Ref Range Status  ?12/31/2020 4.0 3.5 - 5.2 mmol/L Final  ?   ?  ?  Passed - Patient is not pregnant  ?  ?  Passed - Last BP in normal range  ?  BP Readings from Last 1 Encounters:  ?05/05/21 120/78  ?   ?  ?  Passed - Valid encounter within last 6 months  ?  Recent Outpatient Visits   ?      ? 3 months ago Type 2 diabetes mellitus with hyperlipidemia (HCC)  ? [facility] Jonah Blue B, MD  ? 7 months ago Type 2 diabetes mellitus with hyperlipidemia [facility])  ? [facility] Marcine Matar, MD  ? 1 year ago Encounter for Medicare annual wellness exam  ? [facility] And Wellness Marcine Matar, MD  ? 1 year ago Type 2 diabetes mellitus without complication, without long-term current use of insulin (HCC)  ? [facility] And Wellness Marcine Matar, MD  ? 1 year ago Need for influenza vaccination  ? [facility] Lois Huxley, Cornelius Moras, RPH-CPP  ?  ?  ? ?  ?  ?  ?? amLODipine (NORVASC) 10 MG tablet [Pharmacy Med Name: AMLODIPINE BESYLATE 10 MG TAB] 90 tablet  0  ?  Sig: TAKE 1 TABLET BY MOUTH EVERY DAY  ?  ? Cardiovascular: Calcium Channel Blockers 2 Passed - 08/22/2021 12:10 AM  ?  ?  Passed - Last BP in normal range  ?  BP Readings from Last 1 Encounters:  ?05/05/21 120/78  ?   ?  ?  Passed - Last Heart Rate in normal range  ?  Pulse Readings from Last 1 Encounters:  ?05/05/21 (!) 102  ?   ?  ?  Passed - Valid encounter within last 6 months  ?  Recent Outpatient Visits   ?      ? 3 months ago Type 2 diabetes mellitus with hyperlipidemia (HCC)  ? [facility] Jonah Blue B, MD  ? 7 months ago Type 2 diabetes mellitus with hyperlipidemia [facility])  ? [facility] Marcine Matar, MD  ? 1 year ago Encounter for Medicare annual wellness exam  ? [facility] Jonah Blue B, MD  ? 1 year ago Type 2 diabetes mellitus without complication, without  long-term current use of insulin (HCC)  ? [facility] Marcine Matar, MD  ? 1 year ago Need for influenza vaccination  ? [facility] Lois Huxley, Cornelius Moras, RPH-CPP  ?  ?  ? ?  ?  ?  ? ? ?

## 2021-10-17 ENCOUNTER — Other Ambulatory Visit: Payer: Self-pay | Admitting: Internal Medicine

## 2021-10-17 DIAGNOSIS — E119 Type 2 diabetes mellitus without complications: Secondary | ICD-10-CM

## 2021-10-17 NOTE — Telephone Encounter (Signed)
Patient will need an office visit for further refills. Requested Prescriptions  Pending Prescriptions Disp Refills  . metFORMIN (GLUCOPHAGE) 1000 MG tablet [Pharmacy Med Name: METFORMIN HCL 1,000 MG TABLET] 180 tablet 1    Sig: TAKE 1 TABLET BY MOUTH 2 (TWO) TIMES DAILY WITH A MEAL. FOR DIABETES     Endocrinology:  Diabetes - Biguanides Failed - 10/17/2021 12:12 AM      Failed - B12 Level in normal range and within 720 days    No results found for: "VITAMINB12"       Failed - CBC within normal limits and completed in the last 12 months    WBC  Date Value Ref Range Status  12/31/2020 7.2 3.4 - 10.8 x10E3/uL Final  09/08/2013 5.4 4.0 - 10.5 K/uL Final   RBC  Date Value Ref Range Status  12/31/2020 4.88 3.77 - 5.28 x10E6/uL Final  09/08/2013 4.68 3.87 - 5.11 MIL/uL Final   Hemoglobin  Date Value Ref Range Status  12/31/2020 13.4 11.1 - 15.9 g/dL Final   Hematocrit  Date Value Ref Range Status  12/31/2020 39.8 34.0 - 46.6 % Final   MCHC  Date Value Ref Range Status  12/31/2020 33.7 31.5 - 35.7 g/dL Final  09/08/2013 33.9 30.0 - 36.0 g/dL Final   [facility]  Date Value Ref Range Status  12/31/2020 27.5 26.6 - 33.0 pg Final  09/08/2013 27.8 26.0 - 34.0 pg Final   MCV  Date Value Ref Range Status  12/31/2020 82 79 - 97 fL Final   No results found for: "PLTCOUNTKUC", "LABPLAT", "POCPLA" RDW  Date Value Ref Range Status  12/31/2020 13.8 11.7 - 15.4 % Final         Passed - Cr in normal range and within 360 days    Creat  Date Value Ref Range Status  04/04/2016 1.11 (H) 0.50 - 0.99 mg/dL Final    Comment:      For patients > or = 67 years of age: The upper reference limit for Creatinine is approximately 13% higher for people identified as African-American.      Creatinine, Ser  Date Value Ref Range Status  12/31/2020 0.79 0.57 - 1.00 mg/dL Final   Creatinine, Urine  Date Value Ref Range Status  04/04/2016 43 20 - 320 mg/dL Final         Passed - HBA1C is between  0 and 7.9 and within 180 days    HbA1c, POC (controlled diabetic range)  Date Value Ref Range Status  05/05/2021 7.6 (A) 0.0 - 7.0 % Final         Passed - eGFR in normal range and within 360 days    GFR, Est African American  Date Value Ref Range Status  04/04/2016 62 >=60 mL/min Final   GFR calc Af Amer  Date Value Ref Range Status  05/19/2019 91 >59 mL/min/1.73 Final   GFR, Est Non African American  Date Value Ref Range Status  04/04/2016 54 (L) >=60 mL/min Final   GFR calc non Af Amer  Date Value Ref Range Status  05/19/2019 79 >59 mL/min/1.73 Final   eGFR  Date Value Ref Range Status  12/31/2020 82 >59 mL/min/1.73 Final         Passed - Valid encounter within last 6 months    Recent Outpatient Visits          5 months ago Type 2 diabetes mellitus with hyperlipidemia [facility])    [facility] Ladell Pier, MD  9 months ago Type 2 diabetes mellitus with hyperlipidemia [facility])   [place] Ladell Pier, MD   1 year ago Encounter for [employer] annual wellness exam   [place] Karle Plumber B, MD   1 year ago Type 2 diabetes mellitus without complication, without long-term current use of insulin [facility])   [place] Ladell Pier, MD   1 year ago Need for influenza vaccination   [place], RPH-CPP

## 2021-11-18 ENCOUNTER — Emergency Department (HOSPITAL_COMMUNITY)
Admission: EM | Admit: 2021-11-18 | Discharge: 2021-11-18 | Disposition: A | Discharge status: Home / Self Care | Payer: Medicare Other | Attending: Emergency Medicine | Admitting: Emergency Medicine

## 2021-11-18 ENCOUNTER — Encounter (HOSPITAL_COMMUNITY): Payer: Self-pay | Admitting: Emergency Medicine

## 2021-11-18 ENCOUNTER — Emergency Department (HOSPITAL_COMMUNITY): Payer: Medicare Other

## 2021-11-18 ENCOUNTER — Other Ambulatory Visit: Payer: Self-pay

## 2021-11-18 DIAGNOSIS — R059 Cough, unspecified: Secondary | ICD-10-CM | POA: Diagnosis not present

## 2021-11-18 DIAGNOSIS — I10 Essential (primary) hypertension: Secondary | ICD-10-CM | POA: Diagnosis not present

## 2021-11-18 DIAGNOSIS — R0600 Dyspnea, unspecified: Secondary | ICD-10-CM | POA: Diagnosis not present

## 2021-11-18 DIAGNOSIS — J029 Acute pharyngitis, unspecified: Secondary | ICD-10-CM | POA: Diagnosis not present

## 2021-11-18 DIAGNOSIS — Z79899 Other long term (current) drug therapy: Secondary | ICD-10-CM | POA: Insufficient documentation

## 2021-11-18 DIAGNOSIS — E119 Type 2 diabetes mellitus without complications: Secondary | ICD-10-CM | POA: Insufficient documentation

## 2021-11-18 DIAGNOSIS — R053 Chronic cough: Secondary | ICD-10-CM

## 2021-11-18 DIAGNOSIS — Z7984 Long term (current) use of oral hypoglycemic drugs: Secondary | ICD-10-CM | POA: Insufficient documentation

## 2021-11-18 LAB — CBC WITH DIFFERENTIAL/PLATELET
Abs Immature Granulocytes: 0.01 10*3/uL (ref 0.00–0.07)
Basophils Absolute: 0 10*3/uL (ref 0.0–0.1)
Basophils Relative: 0 %
Eosinophils Absolute: 0.1 10*3/uL (ref 0.0–0.5)
Eosinophils Relative: 1 %
HCT: 41.6 % (ref 36.0–46.0)
Hemoglobin: 14.1 g/dL (ref 12.0–15.0)
Immature Granulocytes: 0 %
Lymphocytes Relative: 24 %
Lymphs Abs: 1.2 10*3/uL (ref 0.7–4.0)
MCH: 27.5 pg (ref 26.0–34.0)
MCHC: 33.9 g/dL (ref 30.0–36.0)
MCV: 81.1 fL (ref 80.0–100.0)
Monocytes Absolute: 0.5 10*3/uL (ref 0.1–1.0)
Monocytes Relative: 10 %
Neutro Abs: 3.1 10*3/uL (ref 1.7–7.7)
Neutrophils Relative %: 65 %
Platelets: 252 10*3/uL (ref 150–400)
RBC: 5.13 MIL/uL — ABNORMAL HIGH (ref 3.87–5.11)
RDW: 13.6 % (ref 11.5–15.5)
WBC: 4.9 10*3/uL (ref 4.0–10.5)
nRBC: 0 % (ref 0.0–0.2)

## 2021-11-18 LAB — BASIC METABOLIC PANEL
Anion gap: 9 (ref 5–15)
BUN: 10 mg/dL (ref 8–23)
CO2: 25 mmol/L (ref 22–32)
Calcium: 9 mg/dL (ref 8.9–10.3)
Chloride: 106 mmol/L (ref 98–111)
Creatinine, Ser: 0.77 mg/dL (ref 0.44–1.00)
GFR, Estimated: >60 mL/min (ref >60)
Glucose, Bld: 125 mg/dL — ABNORMAL HIGH (ref 70–99)
Potassium: 4.1 mmol/L (ref 3.5–5.1)
Sodium: 140 mmol/L (ref 135–145)

## 2021-11-18 LAB — GROUP A STREP BY PCR: Group A Strep by PCR: NOT DETECTED

## 2021-11-18 LAB — MONONUCLEOSIS SCREEN: Mono Screen: NEGATIVE

## 2021-11-18 MED ORDER — AZITHROMYCIN 250 MG PO TABS
ORAL_TABLET | ORAL | 0 refills | Status: DC
Start: 2021-11-18 — End: 2021-12-21

## 2021-11-18 MED ORDER — DEXAMETHASONE 4 MG PO TABS
4.0000 mg | ORAL_TABLET | Freq: Once | ORAL | Status: AC
  Administered 2021-11-18: 4 mg via ORAL
  Filled 2021-11-18: qty 1

## 2021-11-18 MED ORDER — ALBUTEROL SULFATE HFA 108 (90 BASE) MCG/ACT IN AERS
2.0000 | INHALATION_SPRAY | Freq: Once | RESPIRATORY_TRACT | Status: AC
  Administered 2021-11-18: 2 via RESPIRATORY_TRACT
  Filled 2021-11-18: qty 6.7

## 2021-11-18 NOTE — Discharge Instructions (Addendum)
I have sent the antibiotics we discussed to your pharmacy.  Take 2 today and then take 1 in the next days for 4 days.  Follow-up with your PCP.  And as we discussed, continue to do your best to stop smoking.  It was a pleasure to meet you and I hope that you feel better.  Remember that your blood sugars may be high for a couple of days after the steroid you received today.

## 2021-11-18 NOTE — ED Provider Notes (Signed)
[place] EMERGENCY DEPARTMENT Provider Note   CSN: [id number] Arrival date & time: 11/18/21  7588     History  Chief Complaint  Patient presents with   Sore Throat    Stephanie Neal is a 66 y.o. female with a past medical history of tobacco dependence, hypertension, hyperlipidemia and diabetes presenting today with complaint of sore throat and hoarseness for the past 8 months.  It is reportedly gotten worse over the past 2 months and this morning she choked on her coffee reports sore throat from this.  Says that her cough is occasionally productive.  Was seen by PCP a little more than a month ago and prescribed medications that helped her symptoms.  She believes 1 was an antibiotic.  Continues to smoke cigarettes daily   Sore Throat       Home Medications Prior to Admission medications   Medication Sig Start Date End Date Taking? Authorizing Provider  amLODipine (NORVASC) 10 MG tablet TAKE 1 TABLET BY MOUTH EVERY DAY 08/23/21   Ladell Pier, MD  atorvastatin (LIPITOR) 40 MG tablet TAKE 1 TABLET BY MOUTH EVERY DAY 07/11/21   Ladell Pier, MD  azithromycin (ZITHROMAX Z-PAK) 250 MG tablet 2 tabs PO x 1 then 1 tab PO daily 11/18/21   Liyanna Cartwright A, PA-C  Blood Glucose Monitoring Suppl (ACCU-CHEK GUIDE ME) w/Device KIT Use as directed 05/05/21   Ladell Pier, MD  glimepiride (AMARYL) 2 MG tablet 2 tabs p.o. every morning and 1 tablet p.o. every afternoon 05/05/21   Ladell Pier, MD  glucose blood (ACCU-CHEK GUIDE) test strip Use as instructed 05/05/21   Ladell Pier, MD  Lancets Misc. (ACCU-CHEK FASTCLIX LANCET) KIT Use as directed 05/05/21   Ladell Pier, MD  lisinopril (ZESTRIL) 30 MG tablet TAKE 1 TABLET BY MOUTH EVERY DAY 08/23/21   Ladell Pier, MD  metFORMIN (GLUCOPHAGE) 1000 MG tablet TAKE 1 TABLET BY MOUTH 2 (TWO) TIMES DAILY WITH A MEAL. FOR DIABETES 10/17/21   Ladell Pier, MD  nicotine polacrilex  (NICORETTE) 2 MG gum Take 1 each (2 mg total) by mouth as needed for smoking cessation. 06/29/20   Ladell Pier, MD      Allergies    Penicillins    Review of Systems   Review of Systems  Physical Exam Updated Vital Signs BP 125/72   Pulse (!) 105   Temp 98.5 F (36.9 C) (Oral)   Resp 16   SpO2 92%  Physical Exam Vitals and nursing note reviewed.  Constitutional:      General: She is not in acute distress.    Appearance: Normal appearance. She is not ill-appearing.  HENT:     Head: Normocephalic and atraumatic.     Nose: No congestion.     Mouth/Throat:     Mouth: Mucous membranes are dry.     Pharynx: Uvula midline. No pharyngeal swelling, oropharyngeal exudate or posterior oropharyngeal erythema.  Eyes:     General: No scleral icterus.    Conjunctiva/sclera: Conjunctivae normal.  Cardiovascular:     Rate and Rhythm: Normal rate and regular rhythm.  Pulmonary:     Effort: Pulmonary effort is normal. No respiratory distress.     Breath sounds: No wheezing or rales.  Skin:    General: Skin is warm and dry.     Findings: No rash.  Neurological:     Mental Status: She is alert.  Psychiatric:  Mood and Affect: Mood normal.     ED Results / Procedures / Treatments   Labs (all labs ordered are listed, but only abnormal results are displayed) Labs Reviewed  CBC WITH DIFFERENTIAL/PLATELET - Abnormal; Notable for the following components:      Result Value   RBC 5.13 (*)    All other components within normal limits  BASIC METABOLIC PANEL - Abnormal; Notable for the following components:   Glucose, Bld 125 (*)    All other components within normal limits  GROUP A STREP BY PCR  MONONUCLEOSIS SCREEN    EKG None  Radiology DG Chest 2 View  Result Date: 11/18/2021 CLINICAL DATA:  Sore throat and dyspnea EXAM: CHEST - 2 VIEW COMPARISON:  Radiographs 02/23/2021 FINDINGS: No focal consolidation, pleural effusion, or pneumothorax. Normal cardiomediastinal  silhouette. No change from 02/23/2021 IMPRESSION: No active cardiopulmonary disease. Electronically Signed   By: Placido Sou M.D.   On: 11/18/2021 10:33    Procedures Procedures   Medications Ordered in ED Medications  dexamethasone (DECADRON) tablet 4 mg (has no administration in time range)  albuterol (VENTOLIN HFA) 108 (90 Base) MCG/ACT inhaler 2 puff (2 puffs Inhalation Given 11/18/21 1016)    ED Course/ Medical Decision Making/ A&P                           Medical Decision Making Amount and/or Complexity of Data Reviewed Labs: ordered. Radiology: ordered.  Risk Prescription drug management.   66 year old female presenting today with a complaint of sore throat and occasionally productive cough for 8 months.  Worse over the past 2 months.  Per chart review, patient was seen by PCP and treated with Z-Pak.  She reports that this made her better.  Treatment: Given a single dose of oral Decadron for her symptoms.  Testing: Chest x-ray ordered, viewed and interpreted by me. I agree with the radiologist that it is negative.  Negative labs  MDM/disposition: Patient is well-appearing.  Her voice is hoarse however airway was clear and she is tolerating secretions.  I suspect that some of her symptoms are secondary to her tobacco use.  We discussed this and she says that she is trying her best to quit smoking.  She also believes her voice is worse since she got an argument with family members recently and was screaming a lot.  At this time patient is stable for discharge and PCP follow-up.  She will be treated with Z-Pak outpatient.  She, her fianc and her son are agreeable to this plan.   Final Clinical Impression(s) / ED Diagnoses Final diagnoses:  Acute pharyngitis, unspecified etiology    Rx / DC Orders ED Discharge Orders          Ordered    azithromycin (ZITHROMAX Z-PAK) 250 MG tablet        11/18/21 1742           Results and diagnoses were explained to the  patient. Return precautions discussed in full. Patient had no additional questions and expressed complete understanding.   This chart was dictated using voice recognition software.  Despite best efforts to proofread,  errors can occur which can change the documentation meaning.    Darliss Ridgel 11/18/21 [place], MD 11/20/21 [phone]

## 2021-11-18 NOTE — ED Triage Notes (Signed)
Patient here for evaluation of sore throat and shortness of breath that started a two months ago. Seen at urgent care and PCP for same, tested for strep and COVID-19 but were negative.

## 2021-11-18 NOTE — ED Provider Triage Note (Signed)
Emergency Medicine Provider Triage Evaluation Note  Lanaysia Yeilyn Gent , a 66 y.o. female  was evaluated in triage.  Pt complains of sore throat and intermittently productive cough for the past 2 months.  Also feeling weak.  Was seen at urgent care a month ago and was negative for pneumonia, COVID and strep.  Continues to have the symptoms.  Reports smoking cigarettes but when asked she says "not much."  She does not elaborate with further questioning.  Review of Systems  Positive: Sore throat and cough Negative:   Physical Exam  BP (!) 135/93 (BP Location: Right Arm)   Pulse (!) 115   Temp 98.3 F (36.8 C) (Oral)   Resp (!) 22   SpO2 94%  Gen:   Awake, no distress   Resp:  Normal effort  MSK:   Moves extremities without difficulty  Other:  Airway clear, tolerating secretions.  No signs of RPA or PTA.  No muffled voice.  Lung sounds clear, no wheezing.  Medical Decision Making  Medically screening exam initiated at 10:09 AM.  Appropriate orders placed.  Draya McKeithan Mccarver was informed that the remainder of the evaluation will be completed by another provider, this initial triage assessment does not replace that evaluation, and the importance of remaining in the ED until their evaluation is complete.   Is a smoker, smells strongly of cigarettes.  Suspect some degree of COPD.  Tachycardic so will obtain basic labs     Ercell Perlman A, PA-C 11/18/21 1013

## 2021-11-20 ENCOUNTER — Other Ambulatory Visit: Payer: Self-pay | Admitting: Internal Medicine

## 2021-11-20 DIAGNOSIS — I10 Essential (primary) hypertension: Secondary | ICD-10-CM

## 2021-11-22 NOTE — Telephone Encounter (Signed)
Requested Prescriptions  Pending Prescriptions Disp Refills  . amLODipine (NORVASC) 10 MG tablet [Pharmacy Med Name: AMLODIPINE BESYLATE 10 MG TAB] 90 tablet 0    Sig: TAKE 1 TABLET BY MOUTH EVERY DAY     Cardiovascular: Calcium Channel Blockers 2 Failed - 11/20/2021 12:07 AM      Failed - Valid encounter within last 6 months    Recent Outpatient Visits          6 months ago Type 2 diabetes mellitus with hyperlipidemia [facility])   [place] [facility] Jonah Blue B, MD   10 months ago Type 2 diabetes mellitus with hyperlipidemia [facility])   [place] [facility] Marcine Matar, MD   1 year ago Encounter for [employer] annual wellness exam   [facility] Jonah Blue B, MD   1 year ago Type 2 diabetes mellitus without complication, without long-term current use of insulin (HCC)   [facility] Marcine Matar, MD   1 year ago Need for influenza vaccination   [facility] Lois Huxley, Cornelius Moras, RPH-CPP      Future Appointments            In 4 weeks Anders Simmonds, PA-C [place] [facility]           Passed - Last BP in normal range    BP Readings from Last 1 Encounters:  11/18/21 118/71         Passed - Last Heart Rate in normal range    Pulse Readings from Last 1 Encounters:  11/18/21 90         . lisinopril (ZESTRIL) 30 MG tablet [Pharmacy Med Name: LISINOPRIL 30 MG TABLET] 90 tablet 0    Sig: TAKE 1 TABLET BY MOUTH EVERY DAY     Cardiovascular:  ACE Inhibitors Failed - 11/20/2021 12:07 AM      Failed - Valid encounter within last 6 months    Recent Outpatient Visits          6 months ago Type 2 diabetes mellitus with hyperlipidemia [facility])   [place] [facility] [facility] Jonah Blue B, MD   10 months ago Type 2 diabetes mellitus with hyperlipidemia [facility])   [place] [facility]  Marcine Matar, MD   1 year ago Encounter for [employer] annual wellness exam   [facility] Jonah Blue B, MD   1 year ago Type 2 diabetes mellitus without complication, without long-term current use of insulin (HCC)   [place] [facility] Marcine Matar, MD   1 year ago Need for influenza vaccination   [facility] Lois Huxley, Cornelius Moras, RPH-CPP      Future Appointments            In 4 weeks Anders Simmonds, PA-C  [facility]           Passed - Cr in normal range and within 180 days    Creat  Date Value Ref Range Status  04/04/2016 1.11 (H) 0.50 - 0.99 mg/dL Final    Comment:      For patients > or = 67 years of age: The upper reference limit for Creatinine is approximately 13% higher for people identified as African-American.      Creatinine, Ser  Date Value Ref Range  Status  11/18/2021 0.77 0.44 - 1.00 mg/dL Final   Creatinine, Urine  Date Value Ref Range Status  04/04/2016 43 20 - 320 mg/dL Final         Passed - K in normal range and within 180 days    Potassium  Date Value Ref Range Status  11/18/2021 4.1 3.5 - 5.1 mmol/L Final         Passed - Patient is not pregnant      Passed - Last BP in normal range    BP Readings from Last 1 Encounters:  11/18/21 118/71

## 2021-12-21 ENCOUNTER — Ambulatory Visit: Payer: Medicare Other | Attending: Physician Assistant | Admitting: Physician Assistant

## 2021-12-21 ENCOUNTER — Encounter: Payer: Self-pay | Admitting: Physician Assistant

## 2021-12-21 VITALS — BP 143/79 | HR 102 | Ht 72.0 in | Wt 175.8 lb

## 2021-12-21 DIAGNOSIS — J9801 Acute bronchospasm: Secondary | ICD-10-CM

## 2021-12-21 DIAGNOSIS — Z7984 Long term (current) use of oral hypoglycemic drugs: Secondary | ICD-10-CM | POA: Diagnosis not present

## 2021-12-21 DIAGNOSIS — E782 Mixed hyperlipidemia: Secondary | ICD-10-CM | POA: Diagnosis not present

## 2021-12-21 DIAGNOSIS — Z09 Encounter for follow-up examination after completed treatment for conditions other than malignant neoplasm: Secondary | ICD-10-CM

## 2021-12-21 DIAGNOSIS — E119 Type 2 diabetes mellitus without complications: Secondary | ICD-10-CM

## 2021-12-21 DIAGNOSIS — E1169 Type 2 diabetes mellitus with other specified complication: Secondary | ICD-10-CM

## 2021-12-21 DIAGNOSIS — I10 Essential (primary) hypertension: Secondary | ICD-10-CM

## 2021-12-21 DIAGNOSIS — R490 Dysphonia: Secondary | ICD-10-CM

## 2021-12-21 DIAGNOSIS — E785 Hyperlipidemia, unspecified: Secondary | ICD-10-CM | POA: Diagnosis not present

## 2021-12-21 LAB — POCT GLYCOSYLATED HEMOGLOBIN (HGB A1C): HbA1c, POC (controlled diabetic range): 7.3 % — AB (ref 0.0–7.0)

## 2021-12-21 LAB — GLUCOSE, POCT (MANUAL RESULT ENTRY): POC Glucose: 162 mg/dl — AB (ref 70–99)

## 2021-12-21 MED ORDER — METFORMIN HCL 1000 MG PO TABS: ORAL_TABLET | ORAL | 1 refills | Status: DC

## 2021-12-21 MED ORDER — GLIMEPIRIDE 2 MG PO TABS: ORAL_TABLET | ORAL | 1 refills | Status: DC

## 2021-12-21 MED ORDER — LISINOPRIL 30 MG PO TABS: 30.0000 mg | ORAL_TABLET | Freq: Every day | ORAL | 0 refills | Status: DC

## 2021-12-21 MED ORDER — FLUTICASONE-SALMETEROL 250-50 MCG/ACT IN AEPB: 1.0000 | INHALATION_SPRAY | Freq: Two times a day (BID) | RESPIRATORY_TRACT | 4 refills | Status: DC

## 2021-12-21 MED ORDER — AMLODIPINE BESYLATE 10 MG PO TABS: 10.0000 mg | ORAL_TABLET | Freq: Every day | ORAL | 1 refills | Status: DC

## 2021-12-21 MED ORDER — ATORVASTATIN CALCIUM 40 MG PO TABS: 40.0000 mg | ORAL_TABLET | Freq: Every day | ORAL | 3 refills | Status: DC

## 2021-12-21 MED ORDER — ALBUTEROL SULFATE HFA 108 (90 BASE) MCG/ACT IN AERS: 1.0000 | INHALATION_SPRAY | Freq: Four times a day (QID) | RESPIRATORY_TRACT | 1 refills | Status: DC | PRN

## 2021-12-21 NOTE — Progress Notes (Signed)
Patient ID: Stephanie Neal, female   DOB: 1955/11/13, 66 y.o.   MRN: [id number]    Stephanie Neal, is a 66 y.o. female  IPJ:[id number]  BHA:[id number]  DOB - [birth year]  Chief Complaint  Patient presents with   Diabetes       Subjective:   Stephanie Neal is a 66 y.o. female here today for a follow up visit  Seen at the ED 11/18/2021.  She is still experiencing hoarseness at times(usu after yelling) and has ENT appt in September.  She is asking me if she should keep the appt.  She has noticed the albuterol given to her at the ED helps but feels she needs something stronger.  Still smoking.  Not checking blood sugars.  Needs RF of meds   From ED note: 66 year old female presenting today with a complaint of sore throat and occasionally productive cough for 8 months.  Worse over the past 2 months.   Per chart review, patient was seen by PCP and treated with Z-Pak.  She reports that this made her better.   Treatment: Given a single dose of oral Decadron for her symptoms.   Testing: Chest x-ray ordered, viewed and interpreted by me. I agree with the radiologist that it is negative.  Negative labs   MDM/disposition: Patient is well-appearing.  Her voice is hoarse however airway was clear and she is tolerating secretions.  I suspect that some of her symptoms are secondary to her tobacco use.  We discussed this and she says that she is trying her best to quit smoking.  She also believes her voice is worse since she got an argument with family members recently and was screaming a lot.  At this time patient is stable for discharge and PCP follow-up.  She will be treated with Z-Pak outpatient.  She, her fianc and her son are agreeable to this plan.    No problems updated.  ALLERGIES: Allergies  Allergen Reactions   Penicillins Other (See Comments)    "blanked out" Has patient had a PCN reaction causing immediate rash, facial/tongue/throat swelling, SOB or lightheadedness with  hypotension: No Has patient had a PCN reaction causing severe rash involving mucus membranes or skin necrosis: No Has patient had a PCN reaction that required hospitalization No Has patient had a PCN reaction occurring within the last 10 years: No If all of the above answers are "NO", then may proceed with Cephalosporin use.     PAST MEDICAL HISTORY: Past Medical History:  Diagnosis Date   Diabetes mellitus    Hyperlipidemia    Hypertension     MEDICATIONS AT HOME: Prior to Admission medications   Medication Sig Start Date End Date Taking? Authorizing Provider  albuterol (VENTOLIN HFA) 108 (90 Base) MCG/ACT inhaler Inhale 1-2 puffs into the lungs every 6 (six) hours as needed for wheezing or shortness of breath. 12/21/21  Yes Lang Zingg M, PA-C  Blood Glucose Monitoring Suppl (ACCU-CHEK GUIDE ME) w/Device KIT Use as directed 05/05/21  Yes Ladell Pier, MD  fluticasone-salmeterol (ADVAIR DISKUS) 250-50 MCG/ACT AEPB Inhale 1 puff into the lungs in the morning and at bedtime. 12/21/21  Yes Freeman Caldron M, PA-C  glucose blood (ACCU-CHEK GUIDE) test strip Use as instructed 05/05/21  Yes Ladell Pier, MD  amLODipine (NORVASC) 10 MG tablet Take 1 tablet (10 mg total) by mouth daily. 12/21/21   Argentina Donovan, PA-C  atorvastatin (LIPITOR) 40 MG tablet Take 1 tablet (40 mg total) by mouth daily. 12/21/21  Freeman Caldron M, PA-C  glimepiride (AMARYL) 2 MG tablet 2 tabs p.o. every morning and 1 tablet p.o. every afternoon 12/21/21   Argentina Donovan, PA-C  Lancets Misc. (ACCU-CHEK FASTCLIX LANCET) KIT Use as directed Patient not taking: Reported on 12/21/2021 05/05/21   Ladell Pier, MD  lisinopril (ZESTRIL) 30 MG tablet Take 1 tablet (30 mg total) by mouth daily. 12/21/21   Argentina Donovan, PA-C  metFORMIN (GLUCOPHAGE) 1000 MG tablet TAKE 1 TABLET BY MOUTH 2 (TWO) TIMES DAILY WITH A MEAL. FOR DIABETES 12/21/21   Argentina Donovan, PA-C  nicotine polacrilex (NICORETTE) 2 MG gum  Take 1 each (2 mg total) by mouth as needed for smoking cessation. Patient not taking: Reported on 12/21/2021 06/29/20   Ladell Pier, MD    ROS: Neg cardiac Neg GI Neg GU Neg MS Neg psych Neg neuro  Objective:   Vitals:   12/21/21 1040  BP: (!) 143/79  Pulse: (!) 102  SpO2: 94%  Weight: 175 lb 12.8 oz (79.7 kg)  Height: 6' (1.829 m)   Exam General appearance : Awake, alert, not in any distress. Speech Clear. Not toxic looking HEENT: Atraumatic and Normocephalic.  Voice is hoarse  Neck: Supple, no JVD. No cervical lymphadenopathy.  Chest: fair air entry bilaterally. No rales/rhonchi. But, there is mild to moderate wheezing throughout CVS: S1 S2 regular, no murmurs.  Abdomen: Bowel sounds present, Non tender and not distended with no gaurding, rigidity or rebound. Neurology: Awake alert, and oriented X 3, CN II-XII intact, Non focal Skin: No Rash  Data Review Lab Results  Component Value Date   HGBA1C 7.6 (A) 05/05/2021   HGBA1C 7.4 (A) 12/31/2020   HGBA1C 9.3 (A) 05/10/2020    Assessment & Plan   1. Type 2 diabetes mellitus with hyperlipidemia (HCC) A1C 7.3 today(improving.)- HgB A1c - Glucose (CBG) - Comprehensive metabolic panel - CBC with Differential/Platelet - glimepiride (AMARYL) 2 MG tablet; 2 tabs p.o. every morning and 1 tablet p.o. every afternoon  Dispense: 270 tablet; Refill: 1  And metformin 1000g bid   2. Hoarseness of voice Keep ENT appt.  Smoking and dangers of nicotine have been discussed at length. Long term health consequences of smoking reviewed in detail.  Methods for helping with cessation have been reviewed.  Patient expresses understanding.   3. Essential hypertension Did not take meds this morning - amLODipine (NORVASC) 10 MG tablet; Take 1 tablet (10 mg total) by mouth daily.  Dispense: 90 tablet; Refill: 1 - lisinopril (ZESTRIL) 30 MG tablet; Take 1 tablet (30 mg total) by mouth daily.  Dispense: 90 tablet; Refill: 0  4. Type 2  diabetes mellitus without complication, without long-term current use of insulin (HCC) - metFORMIN (GLUCOPHAGE) 1000 MG tablet; TAKE 1 TABLET BY MOUTH 2 (TWO) TIMES DAILY WITH A MEAL. FOR DIABETES  Dispense: 180 tablet; Refill: 1  5. Mixed hyperlipidemia - Lipid panel - atorvastatin (LIPITOR) 40 MG tablet; Take 1 tablet (40 mg total) by mouth daily.  Dispense: 90 tablet; Refill: 3  6. Bronchospasm - fluticasone-salmeterol (ADVAIR DISKUS) 250-50 MCG/ACT AEPB; Inhale 1 puff into the lungs in the morning and at bedtime.  Dispense: 1 each; Refill: 4 - albuterol (VENTOLIN HFA) 108 (90 Base) MCG/ACT inhaler; Inhale 1-2 puffs into the lungs every 6 (six) hours as needed for wheezing or shortness of breath.  Dispense: 18 g; Refill: 1  7. Encounter for examination following treatment at hospital    Return in about 4 months (around  04/22/2022) for PCP for chronic conditions.  The patient was given clear instructions to go to ER or return to medical center if symptoms don't improve, worsen or new problems develop. The patient verbalized understanding. The patient was told to call to get lab results if they haven't heard anything in the next week.      Freeman Caldron, PA-C [facility] [place], [place]   12/21/2021, 11:33 AM

## 2021-12-22 LAB — CBC WITH DIFFERENTIAL/PLATELET
Basophils Absolute: 0 10*3/uL (ref 0.0–0.2)
Basos: 0 %
EOS (ABSOLUTE): 0.1 10*3/uL (ref 0.0–0.4)
Eos: 2 %
Hematocrit: 39.6 % (ref 34.0–46.6)
Hemoglobin: 13.4 g/dL (ref 11.1–15.9)
Immature Grans (Abs): 0 10*3/uL (ref 0.0–0.1)
Immature Granulocytes: 0 %
Lymphocytes Absolute: 1.2 10*3/uL (ref 0.7–3.1)
Lymphs: 25 %
MCH: 27.3 pg (ref 26.6–33.0)
MCHC: 33.8 g/dL (ref 31.5–35.7)
MCV: 81 fL (ref 79–97)
Monocytes Absolute: 0.5 10*3/uL (ref 0.1–0.9)
Monocytes: 11 %
Neutrophils Absolute: 3.1 10*3/uL (ref 1.4–7.0)
Neutrophils: 62 %
Platelets: 218 10*3/uL (ref 150–450)
RBC: 4.91 x10E6/uL (ref 3.77–5.28)
RDW: 13 % (ref 11.7–15.4)
WBC: 5 10*3/uL (ref 3.4–10.8)

## 2021-12-22 LAB — COMPREHENSIVE METABOLIC PANEL
ALT: 10 IU/L (ref 0–32)
AST: 14 IU/L (ref 0–40)
Albumin/Globulin Ratio: 1.6 (ref 1.2–2.2)
Albumin: 3.8 g/dL — ABNORMAL LOW (ref 3.9–4.9)
Alkaline Phosphatase: 58 IU/L (ref 44–121)
BUN/Creatinine Ratio: 12 (ref 12–28)
BUN: 9 mg/dL (ref 8–27)
Bilirubin Total: 0.3 mg/dL (ref 0.0–1.2)
CO2: 25 mmol/L (ref 20–29)
Calcium: 9.3 mg/dL (ref 8.7–10.3)
Chloride: 103 mmol/L (ref 96–106)
Creatinine, Ser: 0.74 mg/dL (ref 0.57–1.00)
Globulin, Total: 2.4 g/dL (ref 1.5–4.5)
Glucose: 131 mg/dL — ABNORMAL HIGH (ref 70–99)
Potassium: 4.1 mmol/L (ref 3.5–5.2)
Sodium: 141 mmol/L (ref 134–144)
Total Protein: 6.2 g/dL (ref 6.0–8.5)
eGFR: 89 mL/min/{1.73_m2} (ref >59)

## 2021-12-22 LAB — LIPID PANEL
Chol/HDL Ratio: 3.2 ratio (ref 0.0–4.4)
Cholesterol, Total: 159 mg/dL (ref 100–199)
HDL: 49 mg/dL (ref >39)
LDL Chol Calc (NIH): 96 mg/dL (ref 0–99)
Triglycerides: 73 mg/dL (ref 0–149)
VLDL Cholesterol Cal: 14 mg/dL (ref 5–40)

## 2022-01-10 DIAGNOSIS — Z720 Tobacco use: Secondary | ICD-10-CM | POA: Diagnosis not present

## 2022-01-10 DIAGNOSIS — J383 Other diseases of vocal cords: Secondary | ICD-10-CM | POA: Diagnosis not present

## 2022-01-10 DIAGNOSIS — J384 Edema of larynx: Secondary | ICD-10-CM | POA: Diagnosis not present

## 2022-01-13 NOTE — H&P (Signed)
CHIEF COMPLAINT: Hoarseness  HISTORY OF PRESENT ILLNESS: Stephanie Neal is a 66 y.o. year old female with hx of active smoking/tobacco (1989 - present 1/2PPD) abuse who presents with chief complaint of hoarseness. She states over the past 4-9 months she has had increasing hoarseness and difficulty speaking. She states she initially lost her voice when hollering during a family dispute several months ago. She also notes mucus getting caught in her throat. She feels like her throat is very congested in the morning and she has to cough phlegm up. In addition notes tightness with breathing; has not had any severe respiratory distress / worsening dyspnea when laying supine. She was prescribed a fluticasone inhaler by her primary care physician for this. Denies dysphagia, odynophagia, hemoptysis, lymphadenopathy. Has lost about 15 pounds in the past year which was intentional due to dieting.  Other pertinent history includes type 2 diabetes. She does not have any cardiac conditions. She does not use any blood thinners. No family history of head neck cancer.  She is currently retired. No alcohol or drug use. Is accompanied by her friend Stephanie Neal today.  She would like me to contact her son Stephanie Neal at [phone]  PAST MEDICAL HISTORY. Past Medical History:  Diagnosis Date   Diabetes mellitus (HCC)   Hypertension   PAST SURGICAL HISTORY History reviewed. No pertinent surgical history.  ALLERGIES Penicillins  MEDICATIONS  Current Outpatient Medications:   ACCU-CHEK GUIDE TEST STRIPS Strp test strips, USE AS INSTRUCTED, Disp: , Rfl:   albuterol 90 mcg/actuation inhaler, Inhale into the lungs., Disp: , Rfl:   amLODIPine (NORVASC) 10 MG tablet, Take 1 tablet (10 mg total) by mouth daily., Disp: , Rfl:   atorvastatin (LIPITOR) 40 MG tablet, Take 1 tablet (40 mg total) by mouth daily., Disp: , Rfl:   fluticasone propionate-salmeterol (ADVAIR,WIXELA) 250-50 mcg/dose diskus inhaler, Inhale  into the lungs., Disp: , Rfl:   glimepiride (AMARYL) 2 MG tablet, 2 tabs p.o. every morning and 1 tablet p.o. every afternoon, Disp: , Rfl:   lisinopriL (PRINIVIL,ZESTRIL) 30 MG tablet, Take 1 tablet (30 mg total) by mouth., Disp: , Rfl:   metFORMIN (GLUCOPHAGE) 1000 MG tablet, TAKE 1 TABLET BY MOUTH 2 (TWO) TIMES DAILY WITH A MEAL. FOR DIABETES, Disp: , Rfl:   SOCIAL HISTORY: Social History   Tobacco Use   Smoking status: Every Day  Packs/day: .5  Types: Cigarettes  Start date: 34   Smokeless tobacco: Never  Substance Use Topics   Alcohol use: Never   FAMILY HISTORY History reviewed. No pertinent family history.   Review Of Systems: All systems were reviewed and were negative except for those mentioned in the HPI.   Past medical history, past surgical history, drug allergies, medications, and pertinent social and family medical history were reviewed with the patient, and details of these were provided are in the Otolaryngology patient questionnaire. This was reviewed and verified today.  PHYSICAL EXAMINATION: BP 133/93  Pulse 104  Temp 97.7 F (36.5 C) (Temporal)  Ht 1.829 m (6')  Wt 78.9 kg (174 lb)  BMI 23.60 kg/m  General/Constitutional: Patient is a well-nourished, well-developed female appearing stated age in no acute distress. Voice quality Severely dysphonic with inspiratory stridor at rest.  Psych: Alert & oriented to time, place and person. Answers questions appropriately.  Skin/scalp : Normal and without lesions. No rashes, ulcerations or masses noted.  Head: Normocephalic. No asymmetries noted. No tenderness to palpation of the soft tissue or bony structures of the face or skull.  Eyes: Normal extraocular motions without diplopia. Pupils equal round and reactive to light. No nystagmus noted.  ENT: Ears: Right Ear: Normal pinna, EAC clear; TM intact, no perforation, no effusion, no global retraction, no retraction pockets. Left Ear: Normal pinna, EAC clear; TM  intact, no perforation, no effusion, no global retraction, no retraction pockets.  Nose and Sinuses: No paranasal sinus tenderness. Mucosa without lesions. No polyps are seen. Septum midline and straight.   Mouth: Teeth, gums, tongue, floor of the mouth, retromolar trigone, buccal mucosa, hard palate - normal without lesions. Normal jaw opening. Occlusion - normal.  Oropharynx: Tonsillar fossae, pharyngeal walls, base of tongue - normal. No bleeding or masses.  Neck/Musculoskeletal: Supple. No masses. No areas of tenderness. Nonpalpable thyroid. No limitations in flexion/extension.  Lymph: No cervical lymphadenopathy, No pre or post-auricular lymphadenopathy.  Cranial Nerves/Neuro: Function II through XII - normal. No focal deficits noted.  Flexible Fiberoptic Endoscopy (CPT [id number]):  Indication for procedure: Flexible laryngoscopy was indicated for Stridor / hoarseness After verbal consent was obtained, 4% lidocaine and oxymetazoline was sprayed into the patient's bilateral nasal cavities. A flexible fiberoptic laryngoscope was passed through the patient's right naris down to the level of the cords.  Nasal cavity: The septum is midline. The nasal cavity is normal. There are no masses, lesions, or polyps. There is no epistaxis or discharge.  Nasopharynx: The eustachian tube orifice and fossa of Rossenmuller are normal. There are no masses or lesions.  Oropharynx & Hypopharynx: The base of tongue, vallecula, pyriform sinuses, and post-cricoid area are normal. There are no masses or lesions. There is no pooling of secretions.  Larynx:  Severe R>L polypoid corditis (renke's edema) with pedunculated ball-valving mass of the right vocal fold  Endoscopic image:  There are no masses or lesions. The patient has good sensation with a strong gag reflex.  Subglottis: Evaluation of the subglottis on flexible nasopharyngoscopy in an awake patient can be unreliable, but the subglottis was visualized below  the cords and the trachea appeared normal with no evidence of stenosis.  IMAGING/RESULTS - Reviewed in detail as follows: CXR 2 View 11/18/21  FINDINGS:  No focal consolidation, pleural effusion, or pneumothorax. Normal  cardiomediastinal silhouette. No change from 02/23/2021   IMPRESSION:  No active cardiopulmonary disease.   Electronically Signed    By: Minerva Fester M.D.    On: 11/18/2021 10:33  MEDICAL DECISION MAKING IMPRESSION: 66 year old, 30-year tobacco smoker who presents with several month history of increasing stridor and hoarseness. Laryngoscopy demonstrates Reinke's edema with a asymmetric right-sided ball valving mass off the vocal folds possibly Reinke's edema, vocal fold polyp, malignancy. Airway currently stable - patient has stridor at rest but is otherwise breathing comfortably and has not had any respiratory distress episodes prior to this consultation.  1. Reinke's edema of vocal folds  2. Vocal cord mass  3. Tobacco abuse  4. Stridor   RECOMMENDATIONS:  Discussed definitive treatment and biopsy of the right vocal fold lesion with MicroDirect laryngoscopy with excisional biopsy. I consulted with my senior partner Dr. Pollyann Kennedy who reviewed this case with the patient. Discussed risks, benefits, alternatives to the operation. Discussed the possible role for emergent tracheostomy should there be any airway issue during intubation and surgery. The patient is amenable to proceeding with surgery. Discussed that further treatment may be necessary if final pathology demonstrates malignancy.  Emphasized the importance of tobacco cessation to decrease further risks of vocal fold edema and laryngeal squamous cell carcinoma.  Instructed patient to present to  the emergency room should she develop worsening stridor and shortness of breath.  Dr. Pollyann Kennedy will be placing an urgent case request for surgical excision and biopsy of the lesion at [facility].  No orders of the  defined types were placed in this encounter.  No orders of the defined types were placed in this encounter.

## 2022-01-16 ENCOUNTER — Encounter (HOSPITAL_COMMUNITY): Payer: Self-pay | Admitting: Otolaryngology

## 2022-01-16 ENCOUNTER — Other Ambulatory Visit: Payer: Self-pay

## 2022-01-16 NOTE — Progress Notes (Signed)
PCP - Dr. Karle Plumber Cardiologist - Denies  PPM/ICD - Denies  Chest x-ray - N/A EKG - DOS Stress Test - Denies ECHO - Denies Cardiac Cath - Denies  Sleep Study - Denies  Fasting Blood Sugar - Not often Checks Blood Sugar: Cannot recall  Blood Thinner Instructions: N/A Aspirin Instructions: N/A  ERAS Protcol - No  COVID TEST- DOS   Anesthesia review: yes , cardiac hx  Patient denies shortness of breath, fever, cough and chest pain at PAT appointment   All instructions explained to the patient, with a verbal understanding of the material. Patient agrees to go over the instructions while at home for a better understanding. Patient also instructed to self quarantine after being tested for COVID-19. The opportunity to ask questions was provided.  Surgical Instructions    Your procedure is scheduled on Wednesday, January 18, 2022 at 2:30 PM.  Report to [facility] Main Entrance "A" at 12:00 P.M., then check in with the Admitting office.  Call this number if you have problems the morning of surgery:  [phone]   If you have any questions prior to your surgery date call [phone]: Open Monday-Friday 8am-4pm    Remember:  Do not eat or drink after midnight the night before your surgery    Take these medicines the morning of surgery with A SIP OF WATER:  atorvastatin (LIPITOR) albuterol (VENTOLIN HFA) - if needed  Please bring all inhalers with you the day of surgery.    As of today, STOP taking any Aspirin (unless otherwise instructed by your surgeon) Aleve, Naproxen, Ibuprofen, Motrin, Advil, Goody's, BC's, all herbal medications, fish oil, and all vitamins.          WHAT DO I DO ABOUT MY DIABETES MEDICATION?   On 9/19, take your morning dose of glimepiride (AMARYL). Do not take your evening dose. On 9/20, Do not take your glimepiride (AMARYL) or metFORMIN (GLUCOPHAGE).   HOW TO MANAGE YOUR DIABETES BEFORE AND AFTER SURGERY  Why is it important to  control my blood sugar before and after surgery? Improving blood sugar levels before and after surgery helps healing and can limit problems. A way of improving blood sugar control is eating a healthy diet by:  Eating less sugar and carbohydrates  Increasing activity/exercise  Talking with your doctor about reaching your blood sugar goals High blood sugars (greater than 180 mg/dL) can raise your risk of infections and slow your recovery, so you will need to focus on controlling your diabetes during the weeks before surgery. Make sure that the doctor who takes care of your diabetes knows about your planned surgery including the date and location.  How do I manage my blood sugar before surgery? Check your blood sugar at least 4 times a day, starting 2 days before surgery, to make sure that the level is not too high or low.  Check your blood sugar the morning of your surgery when you wake up and every 2 hours until you get to the Short Stay unit.  If your blood sugar is less than 70 mg/dL, you will need to treat for low blood sugar: Do not take insulin. Treat a low blood sugar (less than 70 mg/dL) with  cup of clear juice (cranberry or apple), 4 glucose tablets, OR glucose gel. Recheck blood sugar in 15 minutes after treatment (to make sure it is greater than 70 mg/dL). If your blood sugar is not greater than 70 mg/dL on recheck, call [phone] for further instructions. Report  your blood sugar to the short stay nurse when you get to Short Stay.  If you are admitted to the hospital after surgery: Your blood sugar will be checked by the staff and you will probably be given insulin after surgery (instead of oral diabetes medicines) to make sure you have good blood sugar levels. The goal for blood sugar control after surgery is 80-180 mg/dL.             Do NOT Smoke (Tobacco/Vaping) for 24 hours prior to your procedure.  If you use a CPAP at night, you may bring your mask/headgear for your  overnight stay.   Contacts, glasses, piercing's, hearing aid's, dentures or partials may not be worn into surgery, please bring cases for these belongings.    For patients admitted to the hospital, discharge time will be determined by your treatment team.   Patients discharged the day of surgery will not be allowed to drive home, and someone needs to stay with them for 24 hours.  SURGICAL WAITING ROOM VISITATION Patients having surgery or a procedure may have two support people in the waiting area. Visitors may stay in the waiting area during the procedure and switch out with other visitors if needed. Children under the age of 43 must have an adult accompany them who is not the patient. If the patient needs to stay at the hospital during part of their recovery, the visitor guidelines for inpatient rooms apply.  Please refer to the [facility] website for the visitor guidelines for Inpatients (after your surgery is over and you are in a regular room).    Day of Surgery: Take a shower with mild soap. Do not wear jewelry or makeup Do not wear lotions, powders, perfumes/colognes, or deodorant. Do not shave 48 hours prior to surgery. Do not bring valuables to the hospital.  [facility] is not responsible for any belongings or valuables. Wear Clean/Comfortable clothing the morning of surgery Do not apply any deodorants/lotions.   Remember to brush your teeth WITH YOUR REGULAR TOOTHPASTE.   Please read over the following fact sheets that you were given.  If you received a COVID test during your pre-op visit  it is requested that you wear a mask when out in public, stay away from anyone that may not be feeling well and notify your surgeon if you develop symptoms. If you have been in contact with anyone that has tested positive in the last 10 days please notify you surgeon.

## 2022-01-18 ENCOUNTER — Ambulatory Visit (HOSPITAL_COMMUNITY)
Admission: RE | Admit: 2022-01-18 | Discharge: 2022-01-18 | Disposition: A | Discharge status: Home / Self Care | Payer: Medicare Other | Attending: Otolaryngology | Admitting: Otolaryngology

## 2022-01-18 ENCOUNTER — Ambulatory Visit (HOSPITAL_BASED_OUTPATIENT_CLINIC_OR_DEPARTMENT_OTHER): Payer: Medicare Other | Admitting: Physician Assistant

## 2022-01-18 ENCOUNTER — Other Ambulatory Visit: Payer: Self-pay

## 2022-01-18 ENCOUNTER — Encounter (HOSPITAL_COMMUNITY)
Admission: RE | Disposition: A | Discharge status: Home / Self Care | Payer: Self-pay | Source: Home / Self Care | Attending: Otolaryngology

## 2022-01-18 ENCOUNTER — Encounter (HOSPITAL_COMMUNITY): Payer: Self-pay | Admitting: Otolaryngology

## 2022-01-18 ENCOUNTER — Ambulatory Visit (HOSPITAL_COMMUNITY): Payer: Medicare Other | Admitting: Physician Assistant

## 2022-01-18 DIAGNOSIS — F17210 Nicotine dependence, cigarettes, uncomplicated: Secondary | ICD-10-CM | POA: Diagnosis not present

## 2022-01-18 DIAGNOSIS — J384 Edema of larynx: Secondary | ICD-10-CM | POA: Diagnosis not present

## 2022-01-18 DIAGNOSIS — J383 Other diseases of vocal cords: Secondary | ICD-10-CM

## 2022-01-18 DIAGNOSIS — Z20822 Contact with and (suspected) exposure to covid-19: Secondary | ICD-10-CM | POA: Insufficient documentation

## 2022-01-18 DIAGNOSIS — I10 Essential (primary) hypertension: Secondary | ICD-10-CM | POA: Insufficient documentation

## 2022-01-18 DIAGNOSIS — Z7984 Long term (current) use of oral hypoglycemic drugs: Secondary | ICD-10-CM | POA: Diagnosis not present

## 2022-01-18 DIAGNOSIS — E119 Type 2 diabetes mellitus without complications: Secondary | ICD-10-CM | POA: Insufficient documentation

## 2022-01-18 DIAGNOSIS — J381 Polyp of vocal cord and larynx: Secondary | ICD-10-CM | POA: Diagnosis not present

## 2022-01-18 HISTORY — PX: MICROLARYNGOSCOPY: SHX5208

## 2022-01-18 LAB — GLUCOSE, CAPILLARY
Glucose-Capillary: 94 mg/dL (ref 70–99)
Glucose-Capillary: 78 mg/dL (ref 70–99)

## 2022-01-18 LAB — SARS CORONAVIRUS 2 BY RT PCR: SARS Coronavirus 2 by RT PCR: NEGATIVE

## 2022-01-18 SURGERY — MICROLARYNGOSCOPY
Anesthesia: General | Site: Throat

## 2022-01-18 MED ORDER — LIDOCAINE 2% (20 MG/ML) 5 ML SYRINGE
INTRAMUSCULAR | Status: DC | PRN
  Administered 2022-01-18: 100 mg via INTRAVENOUS

## 2022-01-18 MED ORDER — CHLORHEXIDINE GLUCONATE 0.12 % MT SOLN: 15.0000 mL | Freq: Once | OROMUCOSAL | Status: AC

## 2022-01-18 MED ORDER — ORAL CARE MOUTH RINSE: 15.0000 mL | Freq: Once | OROMUCOSAL | Status: AC

## 2022-01-18 MED ORDER — ROCURONIUM BROMIDE 10 MG/ML (PF) SYRINGE
PREFILLED_SYRINGE | INTRAVENOUS | Status: DC | PRN
  Administered 2022-01-18: 50 mg via INTRAVENOUS

## 2022-01-18 MED ORDER — PHENYLEPHRINE 80 MCG/ML (10ML) SYRINGE FOR IV PUSH (FOR BLOOD PRESSURE SUPPORT)
PREFILLED_SYRINGE | INTRAVENOUS | Status: DC | PRN
  Administered 2022-01-18 (×2): 80 ug via INTRAVENOUS
  Administered 2022-01-18 (×2): 160 ug via INTRAVENOUS

## 2022-01-18 MED ORDER — AMISULPRIDE (ANTIEMETIC) 5 MG/2ML IV SOLN: 10.0000 mg | Freq: Once | INTRAVENOUS | Status: DC | PRN

## 2022-01-18 MED ORDER — LIDOCAINE-EPINEPHRINE 1 %-1:100000 IJ SOLN
INTRAMUSCULAR | Status: AC
  Filled 2022-01-18: qty 1

## 2022-01-18 MED ORDER — ONDANSETRON HCL 4 MG/2ML IJ SOLN: 4.0000 mg | Freq: Once | INTRAMUSCULAR | Status: DC | PRN

## 2022-01-18 MED ORDER — SUGAMMADEX SODIUM 200 MG/2ML IV SOLN
INTRAVENOUS | Status: DC | PRN
  Administered 2022-01-18: 200 mg via INTRAVENOUS

## 2022-01-18 MED ORDER — LACTATED RINGERS IV SOLN: INTRAVENOUS | Status: DC

## 2022-01-18 MED ORDER — FENTANYL CITRATE (PF) 100 MCG/2ML IJ SOLN: 25.0000 ug | INTRAMUSCULAR | Status: DC | PRN

## 2022-01-18 MED ORDER — MIDAZOLAM HCL 2 MG/2ML IJ SOLN
INTRAMUSCULAR | Status: DC | PRN
  Administered 2022-01-18: 1 mg via INTRAVENOUS

## 2022-01-18 MED ORDER — FENTANYL CITRATE (PF) 250 MCG/5ML IJ SOLN
INTRAMUSCULAR | Status: AC
  Filled 2022-01-18: qty 5

## 2022-01-18 MED ORDER — 0.9 % SODIUM CHLORIDE (POUR BTL) OPTIME
TOPICAL | Status: DC | PRN
  Administered 2022-01-18: 1000 mL

## 2022-01-18 MED ORDER — ONDANSETRON HCL 4 MG/2ML IJ SOLN
INTRAMUSCULAR | Status: DC | PRN
  Administered 2022-01-18: 4 mg via INTRAVENOUS

## 2022-01-18 MED ORDER — PROPOFOL 10 MG/ML IV BOLUS
INTRAVENOUS | Status: AC
  Filled 2022-01-18: qty 20

## 2022-01-18 MED ORDER — PROPOFOL 10 MG/ML IV BOLUS
INTRAVENOUS | Status: DC | PRN
  Administered 2022-01-18: 130 ug via INTRAVENOUS
  Administered 2022-01-18: 100 ug/kg/min via INTRAVENOUS

## 2022-01-18 MED ORDER — FENTANYL CITRATE (PF) 250 MCG/5ML IJ SOLN
INTRAMUSCULAR | Status: DC | PRN
  Administered 2022-01-18: 50 ug via INTRAVENOUS
  Administered 2022-01-18 (×2): 25 ug via INTRAVENOUS
  Administered 2022-01-18: 50 ug via INTRAVENOUS

## 2022-01-18 MED ORDER — ACETAMINOPHEN 500 MG PO TABS: 1000.0000 mg | ORAL_TABLET | Freq: Once | ORAL | Status: DC

## 2022-01-18 MED ORDER — OXYCODONE HCL 5 MG/5ML PO SOLN: 5.0000 mg | Freq: Once | ORAL | Status: DC | PRN

## 2022-01-18 MED ORDER — LIDOCAINE-EPINEPHRINE 1 %-1:100000 IJ SOLN
INTRAMUSCULAR | Status: DC | PRN
  Administered 2022-01-18: .7 mL

## 2022-01-18 MED ORDER — CHLORHEXIDINE GLUCONATE 0.12 % MT SOLN
OROMUCOSAL | Status: AC
  Administered 2022-01-18: 15 mL via OROMUCOSAL
  Filled 2022-01-18: qty 15

## 2022-01-18 MED ORDER — ACETAMINOPHEN 500 MG PO TABS
ORAL_TABLET | ORAL | Status: AC
  Filled 2022-01-18: qty 2

## 2022-01-18 MED ORDER — EPINEPHRINE HCL (NASAL) 0.1 % NA SOLN
NASAL | Status: AC
  Filled 2022-01-18: qty 30

## 2022-01-18 MED ORDER — EPINEPHRINE HCL (NASAL) 0.1 % NA SOLN
NASAL | Status: DC | PRN
  Administered 2022-01-18: 10 mL

## 2022-01-18 MED ORDER — DEXAMETHASONE SODIUM PHOSPHATE 10 MG/ML IJ SOLN
INTRAMUSCULAR | Status: DC | PRN
  Administered 2022-01-18: 8 mg via INTRAVENOUS

## 2022-01-18 MED ORDER — OXYCODONE HCL 5 MG PO TABS: 5.0000 mg | ORAL_TABLET | Freq: Once | ORAL | Status: DC | PRN

## 2022-01-18 MED ORDER — MIDAZOLAM HCL 2 MG/2ML IJ SOLN
INTRAMUSCULAR | Status: AC
  Filled 2022-01-18: qty 2

## 2022-01-18 SURGICAL SUPPLY — 32 items
APL SKNCLS STERI-STRIP NONHPOA (GAUZE/BANDAGES/DRESSINGS)
BAG COUNTER SPONGE SURGICOUNT (BAG) ×3 IMPLANT
BAG SPNG CNTER NS LX DISP (BAG) ×2
BENZOIN TINCTURE PRP APPL 2/3 (GAUZE/BANDAGES/DRESSINGS) IMPLANT
CANISTER SUCT 3000ML PPV (MISCELLANEOUS) ×3 IMPLANT
CLEANER TIP ELECTROSURG 2X2 (MISCELLANEOUS) ×2 IMPLANT
CNTNR URN SCR LID CUP LEK RST (MISCELLANEOUS) ×5 IMPLANT
CONT SPEC 4OZ STRL OR WHT (MISCELLANEOUS) ×2
COVER MAYO STAND STRL (DRAPES) ×3 IMPLANT
COVER SURGICAL LIGHT HANDLE (MISCELLANEOUS) ×3 IMPLANT
ELECT COATED BLADE 2.86 ST (ELECTRODE) ×2 IMPLANT
ELECT REM PT RETURN 9FT ADLT (ELECTROSURGICAL) ×2
ELECTRODE REM PT RTRN 9FT ADLT (ELECTROSURGICAL) ×3 IMPLANT
GAUZE 4X4 16PLY ~~LOC~~+RFID DBL (SPONGE) ×3 IMPLANT
GLOVE ECLIPSE 7.5 STRL STRAW (GLOVE) ×3 IMPLANT
GOWN STRL REUS W/ TWL LRG LVL3 (GOWN DISPOSABLE) ×6 IMPLANT
GOWN STRL REUS W/TWL LRG LVL3 (GOWN DISPOSABLE) ×4
KIT BASIN OR (CUSTOM PROCEDURE TRAY) ×3 IMPLANT
KIT TURNOVER KIT B (KITS) ×3 IMPLANT
NDL SPNL 25GX3.5 QUINCKE BL (NEEDLE) ×2 IMPLANT
NEEDLE SPNL 25GX3.5 QUINCKE BL (NEEDLE) ×2 IMPLANT
NS IRRIG 1000ML POUR BTL (IV SOLUTION) ×3 IMPLANT
PAD ARMBOARD 7.5X6 YLW CONV (MISCELLANEOUS) ×6 IMPLANT
PATTIES SURGICAL .5 X1 (DISPOSABLE) ×1 IMPLANT
PENCIL FOOT CONTROL (ELECTRODE) ×2 IMPLANT
SOL ANTI FOG 6CC (MISCELLANEOUS) ×3 IMPLANT
SOLUTION ANTI FOG 6CC (MISCELLANEOUS) ×2
SPECIMEN JAR SMALL (MISCELLANEOUS) ×3 IMPLANT
SYR TB 1ML LUER SLIP (SYRINGE) ×1 IMPLANT
TOWEL GREEN STERILE FF (TOWEL DISPOSABLE) ×3 IMPLANT
TRAY ENT MC OR (CUSTOM PROCEDURE TRAY) ×3 IMPLANT
WATER STERILE IRR 1000ML POUR (IV SOLUTION) ×3 IMPLANT

## 2022-01-18 NOTE — Interval H&P Note (Signed)
History and Physical Interval Note:  01/18/2022 1:39 PM  Stephanie Neal  has presented today for surgery, with the diagnosis of Reinke's edema of vocal folds; Vocal cord mass; Stridor.  The various methods of treatment have been discussed with the patient and family. After consideration of risks, benefits and other options for treatment, the patient has consented to  Procedure(s): MICROLARYNGOSCOPY WITH EXCISION OF POLYPS (N/A) POSSIBLE TRACHEOSTOMY (N/A) as a surgical intervention.  The patient's history has been reviewed, patient examined, no change in status, stable for surgery.  I have reviewed the patient's chart and labs.  Questions were answered to the patient's satisfaction.     Izora Gala

## 2022-01-18 NOTE — Anesthesia Preprocedure Evaluation (Signed)
Anesthesia Evaluation  Patient identified by MRN, date of birth, ID band Patient awake    Reviewed: Allergy & Precautions, NPO status , Patient's Chart, lab work & pertinent test results  History of Anesthesia Complications Negative for: history of anesthetic complications  Airway Mallampati: II  TM Distance: >3 FB Neck ROM: Full    Dental  (+) Dental Advisory Given   Pulmonary Current Smoker and Patient abstained from smoking.,    Pulmonary exam normal        Cardiovascular hypertension, Normal cardiovascular exam     Neuro/Psych negative neurological ROS     GI/Hepatic negative GI ROS, Neg liver ROS,   Endo/Other  diabetes, Type 2  Renal/GU negative Renal ROS  negative genitourinary   Musculoskeletal negative musculoskeletal ROS (+)   Abdominal   Peds  Hematology negative hematology ROS (+)   Anesthesia Other Findings Vocal cord polyps  Reproductive/Obstetrics                            Anesthesia Physical Anesthesia Plan  ASA: 2  Anesthesia Plan: General   Post-op Pain Management: Minimal or no pain anticipated   Induction: Intravenous  PONV Risk Score and Plan: 2 and Ondansetron, Dexamethasone, Treatment may vary due to age or medical condition and Midazolam  Airway Management Planned: Oral ETT  Additional Equipment: None  Intra-op Plan:   Post-operative Plan: Extubation in OR  Informed Consent: I have reviewed the patients History and Physical, chart, labs and discussed the procedure including the risks, benefits and alternatives for the proposed anesthesia with the patient or authorized representative who has indicated his/her understanding and acceptance.     Dental advisory given  Plan Discussed with:   Anesthesia Plan Comments: (Airway discussed with surgeon Dr. Constance Holster. He does not feel there will be any difficulty intubating with a glidescope, but he will be  present during induction in case of need for emergency tracheostomy. )        Anesthesia Quick Evaluation

## 2022-01-18 NOTE — Anesthesia Procedure Notes (Signed)
Procedure Name: Intubation Date/Time: 01/18/2022 2:19 PM  Performed by: Dorann Lodge, CRNAPre-anesthesia Checklist: Patient identified, Emergency Drugs available, Suction available and Patient being monitored Patient Re-evaluated:Patient Re-evaluated prior to induction Oxygen Delivery Method: Circle System Utilized Preoxygenation: Pre-oxygenation with 100% oxygen Induction Type: IV induction Ventilation: Mask ventilation without difficulty and Oral airway inserted - appropriate to patient size Laryngoscope Size: Glidescope and 3 Grade View: Grade I Tube type: Oral Tube size: 6.0 mm Number of attempts: 1 Airway Equipment and Method: Oral airway, Video-laryngoscopy and Rigid stylet Placement Confirmation: ETT inserted through vocal cords under direct vision, positive ETCO2 and breath sounds checked- equal and bilateral Secured at: 24 cm Tube secured with: Tape Dental Injury: Teeth and Oropharynx as per pre-operative assessment  Comments: Placed by Encarnacion Chu, SRNA

## 2022-01-18 NOTE — Op Note (Signed)
OPERATIVE REPORT  DATE OF SURGERY: 01/18/2022  PATIENT:  Stephanie Neal,  66 y.o. female  PRE-OPERATIVE DIAGNOSIS:  Reinke's edema of vocal folds; Vocal cord mass; Stridor  POST-OPERATIVE DIAGNOSIS:  Reinke's edema of vocal folds; Vocal cord mass; Stridor  PROCEDURE:  Procedure(s): MICROLARYNGOSCOPY WITH EXCISION OF laryngeal mass  SURGEON:  Beckie Salts, MD  ASSISTANTS: none  ANESTHESIA:   General   EBL: Less than 20 ml  DRAINS: none  LOCAL MEDICATIONS USED: 1% Xylocaine with epinephrine  SPECIMEN: Right laryngeal mass  COUNTS:  Correct  PROCEDURE DETAILS: The patient was taken to the operating room and placed on the operating table in the supine position. Following induction of general endotracheal anesthesia, the table was turned 90 and the patient was draped in a standard fashion.  A Jako laryngoscope was entered into the oral cavity used to visualize the larynx and attached to the Mayo stand with the suspension apparatus.  The operating microscope was brought into view.  A telescopic rod was used to take photographs of the lesion.  There is a firm bulbous lesion, pedunculated, arising off the right floor of the ventricle.  Bilateral cords contain Rekhi space edema without organized polyps.  No other masses were identified.  The base of the lesion was infiltrated with local anesthetic using 25-gauge spinal needle.  Using microlaryngoscopy scissors and forceps the lesion was dissected out completely and sent for pathologic evaluation.  The cord mucosa was preserved on both sides.  There were no additional masses seen on the left.  The airway was significantly improved following removal of this lesion.  Topical adrenaline was applied on pledgets for hemostasis.  Scope was removed.  The patient was awakened extubated and transferred to recovery in stable condition.    PATIENT DISPOSITION:  To PACU, stable

## 2022-01-18 NOTE — Transfer of Care (Signed)
Immediate Anesthesia Transfer of Care Note  Patient: Stephanie Neal  Procedure(s) Performed: MICROLARYNGOSCOPY WITH EXCISION OF POLYPS (Throat)  Patient Location: PACU  Anesthesia Type:General  Level of Consciousness: awake and alert   Airway & Oxygen Therapy: Patient Spontanous Breathing  Post-op Assessment: Report given to RN and Post -op Vital signs reviewed and stable  Post vital signs: Reviewed and stable  Last Vitals:  Vitals Value Taken Time  BP 115/59 01/18/22 1502  Temp    Pulse 96 01/18/22 1506  Resp 16 01/18/22 1506  SpO2 92 % 01/18/22 1506  Vitals shown include unvalidated device data.  Last Pain:  Vitals:   01/18/22 1248  TempSrc:   PainSc: 0-No pain         Complications: No notable events documented.

## 2022-01-18 NOTE — Anesthesia Postprocedure Evaluation (Signed)
Anesthesia Post Note  Patient: Musician  Procedure(s) Performed: MICROLARYNGOSCOPY WITH EXCISION OF POLYPS (Throat)     Patient location during evaluation: PACU Anesthesia Type: General Level of consciousness: awake and alert Pain management: pain level controlled Vital Signs Assessment: post-procedure vital signs reviewed and stable Respiratory status: spontaneous breathing, nonlabored ventilation and respiratory function stable Cardiovascular status: blood pressure returned to baseline and stable Postop Assessment: no apparent nausea or vomiting Anesthetic complications: no   No notable events documented.  Last Vitals:  Vitals:   01/18/22 1600 01/18/22 1615  BP: 127/76   Pulse: 88   Resp: 15   Temp:  36.7 C  SpO2: 93%     Last Pain:  Vitals:   01/18/22 1600  TempSrc:   PainSc: 0-No pain                 Lidia Collum

## 2022-01-19 ENCOUNTER — Encounter (HOSPITAL_COMMUNITY): Payer: Self-pay | Admitting: Otolaryngology

## 2022-01-23 LAB — SURGICAL PATHOLOGY

## 2022-03-13 ENCOUNTER — Other Ambulatory Visit: Payer: Self-pay | Admitting: Internal Medicine

## 2022-03-13 DIAGNOSIS — E1169 Type 2 diabetes mellitus with other specified complication: Secondary | ICD-10-CM

## 2022-04-13 ENCOUNTER — Encounter: Payer: Self-pay | Admitting: Internal Medicine

## 2022-04-13 ENCOUNTER — Telehealth: Payer: Self-pay | Admitting: Internal Medicine

## 2022-04-13 ENCOUNTER — Ambulatory Visit: Payer: Medicare Other | Attending: Physician Assistant | Admitting: Internal Medicine

## 2022-04-13 VITALS — BP 134/70 | HR 91 | Temp 97.8°F | Ht 72.0 in | Wt 182.0 lb

## 2022-04-13 DIAGNOSIS — Z23 Encounter for immunization: Secondary | ICD-10-CM

## 2022-04-13 DIAGNOSIS — Z1211 Encounter for screening for malignant neoplasm of colon: Secondary | ICD-10-CM

## 2022-04-13 DIAGNOSIS — E1159 Type 2 diabetes mellitus with other circulatory complications: Secondary | ICD-10-CM

## 2022-04-13 DIAGNOSIS — Z87891 Personal history of nicotine dependence: Secondary | ICD-10-CM

## 2022-04-13 DIAGNOSIS — M533 Sacrococcygeal disorders, not elsewhere classified: Secondary | ICD-10-CM | POA: Diagnosis not present

## 2022-04-13 DIAGNOSIS — E785 Hyperlipidemia, unspecified: Secondary | ICD-10-CM

## 2022-04-13 DIAGNOSIS — I152 Hypertension secondary to endocrine disorders: Secondary | ICD-10-CM

## 2022-04-13 DIAGNOSIS — E1169 Type 2 diabetes mellitus with other specified complication: Secondary | ICD-10-CM | POA: Diagnosis not present

## 2022-04-13 DIAGNOSIS — M545 Low back pain, unspecified: Secondary | ICD-10-CM

## 2022-04-13 DIAGNOSIS — Z86010 Personal history of colonic polyps: Secondary | ICD-10-CM | POA: Diagnosis not present

## 2022-04-13 LAB — POCT GLYCOSYLATED HEMOGLOBIN (HGB A1C): HbA1c, POC (controlled diabetic range): 7.4 % — AB (ref 0.0–7.0)

## 2022-04-13 LAB — GLUCOSE, POCT (MANUAL RESULT ENTRY): POC Glucose: 296 mg/dl — AB (ref 70–99)

## 2022-04-13 MED ORDER — LIDOCAINE 5 % EX PTCH: 1.0000 | MEDICATED_PATCH | CUTANEOUS | 0 refills | Status: DC

## 2022-04-13 NOTE — Progress Notes (Signed)
Patient ID: Stephanie Neal, female    DOB: [birth year]  MRN: [id number]  CC: Diabetes (Dm f/u. Med refill /Incident where pt missed the chair & fell onto the ground. Back pain, Requesting referral to [facility] on [address]Yes to flu vax.)   Subjective: Stephanie Neal is a 66 y.o. female who presents for chronic ds management Her concerns today include:  Hx of DM, HTN, tob, obesity, HL.   Hoarseness: Since last visit with me in January of this year, she finally saw ENT for chronic hoarseness.  Found to have vocal cord lesion that was removed 01/18/2022 by Dr. Constance Holster.  Pathology was not cancerous.  Consistent with vocal cord polyp.  She feels the hoarseness has improved. Had f/u with ENT already  DM  Results for orders placed or performed in visit on 04/13/22  POCT glucose (manual entry)  Result Value Ref Range   POC Glucose 296 (A) 70 - 99 mg/dl  POCT glycosylated hemoglobin (Hb A1C)  Result Value Ref Range   Hemoglobin A1C     HbA1c POC (<> result, manual entry)     HbA1c, POC (prediabetic range)     HbA1c, POC (controlled diabetic range) 7.4 (A) 0.0 - 7.0 %  Compliant with Metformin 1 gram BID and Amaryl 4 mg in a.m/2 mg p.m Does not check BS.  Too busy taking care of grand-baby Doing better with eating habits: snack on nuts, apples and banana.  Drnks sugar free drinks and water.  Eating more veggies Walks around her complex 2x/wk three lapses Wgh up 7 lbs since last visit 11/2021 with our PA  HYPERTENSION Currently taking: see medication list.  She is on amlodipine 10 mg daily, lisinopril 30 mg daily and took already for this morning Med Adherence: _0  Yes    _1  No Medication side effects: _2  Yes    _3  No Adherence with salt restriction: _4  Yes    _5  No Home Monitoring?: _6  Yes    _7  No but has device Monitoring Frequency:  Home BP results range:  SOB? _8  Yes    _9  No Chest Pain?: _10  Yes    _11  No Leg swelling?: _12  Yes    _13  No Headaches?: _14  Yes    _15   No Dizziness? _16  Yes    _17  No Comments:   HL: taking Lipitor 40 mg daily.  Denies any muscle aches or cramps with the medication.  Tob:  quit since throat surgery in September of this year.  Concern about  pain across her lower back and tailbone post fall 3 months ago. Went to sit back in a chair that slip out from under her.  Landed on wood floor in in her kitchen Pain across lower lumbar region and in tailbone Pain is intermittent.  Worse with walking up and down stairs No radiation down legs.  No numbness in the legs.  She sometimes gets tingling in the toes.. -No incontinence of urine or bowel.  She is requesting referral for some physical therapy.  She has been using ibuprofen as needed.  HM: Due for Eye exam ([place]).  Agrees to getting Flu and RSV vaccines.  Due for PCV 20 but prefers to return in 1 wk to get this shot.  Due for repeat c-scope.  Last colonoscopy was done back in 2020.  She had 8 polyps removed. Patient Active Problem List   Diagnosis Date Noted   Aortic atherosclerosis ([place]) 05/05/2021   Tetanus, diphtheria, and acellular pertussis (Tdap)  vaccination declined 05/10/2020   Hyperlipidemia associated with type 2 diabetes mellitus ([place]) 11/18/2019   Chronic pain of right knee 11/18/2019   Mixed hyperlipidemia 06/25/2018   Over weight 06/25/2018   Stress due to family tension 11/13/2017   DM type 2 (diabetes mellitus, type 2) ([place]) 03/05/2014   HYPERCHOLESTEROLEMIA 03/12/2007   OBESITY 03/12/2007   Tobacco dependence 03/12/2007   HTN (hypertension) 03/12/2007   ALLERGIC RHINITIS, SEASONAL 03/12/2007     Current Outpatient Medications on File Prior to Visit  Medication Sig Dispense Refill   amLODipine (NORVASC) 10 MG tablet Take 1 tablet (10 mg total) by mouth daily. 90 tablet 1   atorvastatin (LIPITOR) 40 MG tablet Take 1 tablet (40 mg total) by mouth daily. 90 tablet 3   Blood Glucose Monitoring Suppl (ACCU-CHEK GUIDE ME) w/Device KIT Use as  directed 1 kit 0   glimepiride (AMARYL) 2 MG tablet Take 2 tabs (4 mg) in the morning and 1 tablet (2 mg) in the afternoon. 270 tablet 0   glucose blood (ACCU-CHEK GUIDE) test strip Use as instructed 100 each 12   Lancets Misc. (ACCU-CHEK FASTCLIX LANCET) KIT Use as directed 100 kit 6   lisinopril (ZESTRIL) 30 MG tablet Take 1 tablet (30 mg total) by mouth daily. 90 tablet 0   metFORMIN (GLUCOPHAGE) 1000 MG tablet TAKE 1 TABLET BY MOUTH 2 (TWO) TIMES DAILY WITH A MEAL. FOR DIABETES 180 tablet 1   albuterol (VENTOLIN HFA) 108 (90 Base) MCG/ACT inhaler Inhale 1-2 puffs into the lungs every 6 (six) hours as needed for wheezing or shortness of breath. (Patient not taking: Reported on 04/13/2022) 18 g 1   No current facility-administered medications on file prior to visit.    Allergies  Allergen Reactions   Penicillins Other (See Comments)    "blanked out" Has patient had a PCN reaction causing immediate rash, facial/tongue/throat swelling, SOB or lightheadedness with hypotension: No Has patient had a PCN reaction causing severe rash involving mucus membranes or skin necrosis: No Has patient had a PCN reaction that required hospitalization No Has patient had a PCN reaction occurring within the last 10 years: No If all of the above answers are "NO", then may proceed with Cephalosporin use.     Social History   Socioeconomic History   Marital status: Single    Spouse name: Not on file   Number of children: Not on file   Years of education: Not on file   Highest education level: Not on file  Occupational History   Not on file  Tobacco Use   Smoking status: Former   Smokeless tobacco: Never  Vaping Use   Vaping Use: Never used  Substance and Sexual Activity   Alcohol use: No   Drug use: No   Sexual activity: Not on file  Other Topics Concern   Not on file  Social History Narrative   Not on file   Social Determinants of Health   Financial Resource Strain: Not on file  Food  Insecurity: Not on file  Transportation Needs: Not on file  Physical Activity: Not on file  Stress: Not on file  Social Connections: Not on file  Intimate Partner Violence: Not on file    Family History  Problem Relation Age of Onset   Hypertension Mother    Diabetes Mother    COPD Father    Heart disease Father    Diabetes Father     Past Surgical History:  Procedure Laterality Date   FOOT SURGERY  MICROLARYNGOSCOPY N/A 01/18/2022   Procedure: MICROLARYNGOSCOPY WITH EXCISION OF POLYPS;  Surgeon: Izora Gala, MD;  Location: [facility];  Service: ENT;  Laterality: N/A;    ROS: Review of Systems Negative except as stated above  PHYSICAL EXAM: BP 134/70   Pulse 91   Temp 97.8 F (36.6 C) (Oral)   Ht 6' (1.829 m)   Wt 182 lb (82.6 kg)   SpO2 97%   BMI 24.68 kg/m   Wt Readings from Last 3 Encounters:  04/13/22 182 lb (82.6 kg)  01/18/22 175 lb (79.4 kg)  12/21/21 175 lb 12.8 oz (79.7 kg)    Physical Exam  General appearance - alert, well appearing, elderly African-American female and in no distress Mental status - normal mood, behavior, speech, dress, motor activity, and thought processes Neck - supple, no significant adenopathy Chest - clear to auscultation, no wheezes, rales or rhonchi, symmetric air entry Heart - normal rate, regular rhythm, normal S1, S2, no murmurs, rubs, clicks or gallops Neurological -gross sensation intact in both legs. Musculoskeletal -straight leg raise negative bilaterally.  Mild tenderness on palpation of the lower lumbar spine and over the coccyx bone. Extremities -no lower extremity edema.      Latest Ref Rng & Units 12/21/2021   11:05 AM 11/18/2021   10:13 AM 12/31/2020   11:38 AM  CMP  Glucose 70 - 99 mg/dL 131  125  84   BUN 8 - 27 mg/dL _0 Creatinine 0.57 - 1.00 mg/dL 0.74  0.77  0.79   Sodium 134 - 144 mmol/L 141  140  142   Potassium 3.5 - 5.2 mmol/L 4.1  4.1  4.0   Chloride 96 - 106 mmol/L 103  106  106   CO2 20 -  29 mmol/L _1 Calcium 8.7 - 10.3 mg/dL 9.3  9.0  9.5   Total Protein 6.0 - 8.5 g/dL 6.2   6.4   Total Bilirubin 0.0 - 1.2 mg/dL 0.3   0.2   Alkaline Phos 44 - 121 IU/L 58   59   AST 0 - 40 IU/L 14   14   ALT 0 - 32 IU/L 10   11    Lipid Panel     Component Value Date/Time   CHOL 159 12/21/2021 1105   TRIG 73 12/21/2021 1105   HDL 49 12/21/2021 1105   CHOLHDL 3.2 12/21/2021 1105   CHOLHDL 5.6 (H) 06/09/2015 1023   VLDL 22 06/09/2015 1023   LDLCALC 96 12/21/2021 1105    CBC    Component Value Date/Time   WBC 5.0 12/21/2021 1105   WBC 4.9 11/18/2021 1013   RBC 4.91 12/21/2021 1105   RBC 5.13 (H) 11/18/2021 1013   HGB 13.4 12/21/2021 1105   HCT 39.6 12/21/2021 1105   PLT 218 12/21/2021 1105   MCV 81 12/21/2021 1105   MCH 27.3 12/21/2021 1105   MCH 27.5 11/18/2021 1013   MCHC 33.8 12/21/2021 1105   MCHC 33.9 11/18/2021 1013   RDW 13.0 12/21/2021 1105   LYMPHSABS 1.2 12/21/2021 1105   MONOABS 0.5 11/18/2021 1013   EOSABS 0.1 12/21/2021 1105   BASOSABS 0.0 12/21/2021 1105    ASSESSMENT AND PLAN: 1. Type 2 diabetes mellitus with hyperlipidemia (HCC) A1c close to goal. She is not wanting additional medication at this time.  She will continue to work on her eating habits. Patient advised to eliminate sugary drinks from the diet, cut  back on portion sizes especially of white carbohydrates, eat more white lean meat like chicken [place] and seafood instead of beef or pork and incorporate fresh fruits and vegetables into the diet daily. Continue metformin 1 g twice a day and Amaryl 4 mg in the morning and 2 mg in the evening. - POCT glucose (manual entry) - POCT glycosylated hemoglobin (Hb A1C) - Ambulatory referral to Ophthalmology  2. Hypertension associated with diabetes ([place]) Blood pressure closer to goal.  Continue Norvasc 10 mg daily and lisinopril 30 mg daily.  3. Former smoker Commended her on quitting.  Encouraged her to remain tobacco free  4. Midline low  back pain without sciatica, unspecified chronicity We will check x-rays of the lumbar spine and coccyx bone. I would prefer that she not use NSAIDs long-term.  Will give a trial of lidocaine patches.  Went over with her how to use the patches.  She should leave on for 12 hours and remove after 12 hours each day - DG Lumbar Spine Complete; Future - lidocaine (LIDODERM) 5 %; Place 1 patch onto the skin daily. Remove & Discard patch within 12 hours or as directed by MD  Dispense: 30 patch; Refill: 0 - Ambulatory referral to Physical Therapy  5. Coccyx pain See #4 above - DG Sacrum/Coccyx; Future - lidocaine (LIDODERM) 5 %; Place 1 patch onto the skin daily. Remove & Discard patch within 12 hours or as directed by MD  Dispense: 30 patch; Refill: 0 - Ambulatory referral to Physical Therapy  6. Need for influenza vaccination Given today  8. Screening for colon cancer - Flu Vaccine QUAD High Dose(Fluad)  7. History of colon polyps - Ambulatory referral to Gastroenterology   Patient was given the opportunity to ask questions.  Patient verbalized understanding of the plan and was able to repeat key elements of the plan.   This documentation was completed using Radio producer.  Any transcriptional errors are unintentional.  Orders Placed This Encounter  Procedures   DG Lumbar Spine Complete   DG Sacrum/Coccyx   Flu Vaccine QUAD High Dose(Fluad)   Ambulatory referral to Ophthalmology   Ambulatory referral to Gastroenterology   Ambulatory referral to Physical Therapy   POCT glucose (manual entry)   POCT glycosylated hemoglobin (Hb A1C)     Requested Prescriptions   Signed Prescriptions Disp Refills   lidocaine (LIDODERM) 5 % 30 patch 0    Sig: Place 1 patch onto the skin daily. Remove & Discard patch within 12 hours or as directed by MD    Return in about 6 weeks (around 05/25/2022) for For Medicare Wellness Visit.  Give appt with Lurena Joiner in 1 wk for Pneumonia  Vaccine.  Karle Plumber, MD, FACP

## 2022-04-14 ENCOUNTER — Telehealth: Payer: Self-pay

## 2022-04-14 NOTE — Telephone Encounter (Signed)
Lidocaine patch prior auth approved until 05/01/2023

## 2022-04-14 NOTE — Telephone Encounter (Signed)
Patient appointment has been switched to an in person visit.

## 2022-04-25 ENCOUNTER — Telehealth: Payer: Self-pay | Admitting: Internal Medicine

## 2022-04-25 NOTE — Telephone Encounter (Signed)
Copied from CRM [id number]. Topic: General - Other >> Apr 25, 2022 10:31 AM Macon Large wrote: Reason for CRM: Pt stated her pharmacy informed her that a prior authorization is needed for the Rx for lidocaine (LIDODERM) 5 %. Cb# [phone] Fax# [phone]

## 2022-04-27 ENCOUNTER — Other Ambulatory Visit: Payer: Self-pay

## 2022-04-27 ENCOUNTER — Ambulatory Visit: Payer: Medicare Other | Admitting: Physician Assistant

## 2022-04-27 NOTE — Telephone Encounter (Signed)
Called & spoke to the patient. Verified name & DOB. Informed of message below. Patient expressed understanding & will be going to pick up prescription.

## 2022-04-28 NOTE — Therapy (Signed)
OUTPATIENT PHYSICAL THERAPY THORACOLUMBAR EVALUATION   Patient Name: Stephanie Neal MRN: [id number] DOB:11-18-1955, 66 y.o., female Today's Date: 05/02/2022  END OF SESSION:  PT End of Session - 05/02/22 1015     Visit Number 1    Number of Visits 17    Date for PT Re-Evaluation 06/27/22    Authorization Type [employer]    PT Start Time 1015    PT Stop Time 1059    PT Time Calculation (min) 44 min    Activity Tolerance Patient tolerated treatment well;No increased pain    Behavior During Therapy WFL for tasks assessed/performed             Past Medical History:  Diagnosis Date   Diabetes mellitus    Hyperlipidemia    Hypertension    Past Surgical History:  Procedure Laterality Date   FOOT SURGERY     MICROLARYNGOSCOPY N/A 01/18/2022   Procedure: MICROLARYNGOSCOPY WITH EXCISION OF POLYPS;  Surgeon: Serena Colonel, MD;  Location: [facility] OR;  Service: ENT;  Laterality: N/A;   Patient Active Problem List   Diagnosis Date Noted   Aortic atherosclerosis (HCC) 05/05/2021   Tetanus, diphtheria, and acellular pertussis (Tdap) vaccination declined 05/10/2020   Hyperlipidemia associated with type 2 diabetes mellitus (HCC) 11/18/2019   Chronic pain of right knee 11/18/2019   Mixed hyperlipidemia 06/25/2018   Over weight 06/25/2018   Stress due to family tension 11/13/2017   DM type 2 (diabetes mellitus, type 2) (HCC) 03/05/2014   HYPERCHOLESTEROLEMIA 03/12/2007   OBESITY 03/12/2007   Tobacco dependence 03/12/2007   HTN (hypertension) 03/12/2007   ALLERGIC RHINITIS, SEASONAL 03/12/2007    PCP: Marcine Matar, MD  REFERRING PROVIDER: Marcine Matar, MD  REFERRING DIAG: M54.50 (ICD-10-CM) - Midline low back pain without sciatica, unspecified chronicity M53.3 (ICD-10-CM) - Coccyx pain  Rationale for Evaluation and Treatment: Rehabilitation  THERAPY DIAG:  Other low back pain  Abnormal posture  Other abnormalities of gait and mobility  Muscle weakness  (generalized)  ONSET DATE: fall ~3 months  SUBJECTIVE:                                                                                                                                                                                           SUBJECTIVE STATEMENT: Pt was trying to sit down in her kitchen when she missed the chair and fell on her bottom. Pt states since initial injury her symptoms have improved somewhat, has been doing exercises on her own which seem helpful. Pt notes most difficulty w/ stair navigation, cleaning around the house, dishes, etc. Has required increased sitting breaks with activity. No imaging  performed yet although XR has been ordered. Pt describes extension based movements as being most painful although with repetition becomes less irritating. Denies LE referral, no N/T. Denies bowel/bladder issues, no saddle anesthesia. No fevers or night sweats.   PERTINENT HISTORY:  HTN, aortic atherosclerosis, DM2, tobacco use, throat surgery September 2023  PAIN:  Are you having pain: 4/10 Location: tailbone How would you describe your pain? sharp Best in past week: 4/10 Aggravating factors: stairs, moving the wrong way, standing Easing factors: movement   PRECAUTIONS: None  WEIGHT BEARING RESTRICTIONS: No  FALLS:  Has patient fallen in last 6 months? Yes. Number of falls 1 in kitchen as described above, denies balance issues   LIVING ENVIRONMENT: 12 steps with rail to bathroom/bedroom, no steps to enter home Lives alone, has friend support   OCCUPATION: not working   PLOF: Independent  PATIENT GOALS: reduce pain, get legs and back stronger   NEXT MD VISIT:   OBJECTIVE:   DIAGNOSTIC FINDINGS:  Sacrum/coccyx and lumbar XR ordered, pending  PATIENT SURVEYS:  FOTO 77 current, 81 predicted  SCREENING FOR RED FLAGS: Unremarkable red flag screening   COGNITION: Overall cognitive status: Within functional limits for tasks assessed     SENSATION: Light  touch intact B LE   POSTURE: increased lordosis, elevated UT B  PALPATION: Significant TTP sacrum, moderate tenderness distal QL bilaterally. No other midline tenderness or bony deformity noted, mild tightness B hips but no pain endorsed  LUMBAR ROM:   AROM eval  Flexion 100% p! To ankles  Extension 50% p!  Right lateral flexion   Left lateral flexion   Right rotation 75%  Left rotation 75%   (Blank rows = not tested) Comments: extension more painful than flexion, B rotation pt reports relief   LOWER EXTREMITY ROM:     Active  Right eval Left eval  Hip flexion    Hip extension    Hip internal rotation    Hip external rotation    (Blank rows = not tested) (Key: WFL = within functional limits not formally assessed, * = concordant pain, s = stiffness/stretching sensation, NT = not tested)  Comments:    LOWER EXTREMITY MMT:    MMT Right eval Left eval  Hip flexion 4 4  Hip abduction (modified sitting) 5 5  Hip internal rotation 3+ knee pain 4  Hip external rotation 3+ 3+  Knee flexion 4 4  Knee extension 4 4   (Blank rows = not tested) (Key: WFL = within functional limits not formally assessed, * = concordant pain, s = stiffness/stretching sensation, NT = not tested)  Comments: hip rotation weak but painless B    FUNCTIONAL TESTS:  5xSTS: 34sec standard chair, gentle UE support initially but weaned after 2nd rep. Increased velocity with fatigue although pt reports improvement in symptoms   GAIT: Distance walked: within clinic Assistive device utilized: None Level of assistance: Complete Independence Comments: partial step through pattern B, reduced trunk ROM and arm swing, reduced gait speed and cadence  TODAY'S TREATMENT:  [facility] Adult PT Treatment:                                                DATE: 05/02/22 Therapeutic Exercise: Adductor  iso seated x10 cues for HEP and form TA activation seated x10 cues for HEP and form Verbal review of program and education for home performance  PATIENT EDUCATION:  Education details: Pt education on PT impairments, prognosis, and POC. Informed consent. Rationale for interventions, safe/appropriate HEP performance Person educated: Patient Education method: Explanation, Demonstration, Tactile cues, Verbal cues, and Handouts Education comprehension: verbalized understanding, returned demonstration, verbal cues required, tactile cues required, and needs further education    HOME EXERCISE PROGRAM: Access Code: [facility] URL: [email or web address] Date: 05/02/2022 Prepared by: Fransisco Hertz  Exercises - Seated Transversus Abdominis Bracing  - 1 x daily - 7 x weekly - 3 sets - 10 reps - Seated Hip Adduction Isometrics with Ball  - 1 x daily - 7 x weekly - 3 sets - 10 reps  ASSESSMENT:  CLINICAL IMPRESSION: Patient is a 66 y.o. woman who was seen today for physical therapy evaluation and treatment for low back pain after a fall ~27months ago. Pt reports some improvement in symptoms with exercise but continues to have pain that is requiring her to modify daily activities and housework, increased pain with standing and extension based movements. On examination pt demonstrates limitations in lumbar extension and rotation, most pain with extension and report of mild relief w/ rotation. Pt also demonstrates general LE weakness but most notable in B hip rotators. Tolerates session well without increase in resting pain, reports good relief with HEP. No adverse events. Recommend skilled PT to address aforementioned deficits to maximize functional tolerance and return to PLOF. Pt departs today's session in no acute distress, all voiced questions/concerns addressed appropriately from PT perspective.    OBJECTIVE IMPAIRMENTS: Abnormal gait, decreased activity tolerance, decreased mobility,  difficulty walking, decreased ROM, decreased strength, hypomobility, increased muscle spasms, improper body mechanics, and pain.   ACTIVITY LIMITATIONS: carrying, lifting, bending, standing, squatting, stairs, transfers, and locomotion level  PARTICIPATION LIMITATIONS: meal prep, cleaning, laundry, and community activity  PERSONAL FACTORS: Age, Time since onset of injury/illness/exacerbation, and 1-2 comorbidities: HTN, DM2  are also affecting patient's functional outcome.   REHAB POTENTIAL: Good  CLINICAL DECISION MAKING: Stable/uncomplicated  EVALUATION COMPLEXITY: Low   GOALS: Goals reviewed with patient? No  SHORT TERM GOALS: Target date: 05/30/2022    Pt will demonstrate appropriate understanding and performance of initially prescribed HEP in order to facilitate improved independence with management of symptoms.  Baseline: HEP provided on eval Goal status: INITIAL   2. Pt will score greater than or equal to 79% on FOTO in order to demonstrate improved perception of function due to symptoms.  Baseline: 77%  Goal status: INITIAL    LONG TERM GOALS: Target date: 06/27/2022    Pt will score 81% on FOTO in order to demonstrate improved perception of functional status due to symptoms.  Baseline: 77% Goal status: INITIAL  2.  Pt will demonstrate full lumbar extension AROM with less than 3/10 pain on NPS in order to demonstrate improved tolerance to functional movement patterns.  Baseline: see ROM chart, 4/10 pain Goal status: INITIAL  3.  Pt will demonstrate hip rotational MMT of at least 4/5 bilaterally in order to demonstrate improved strength for functional movements.  Baseline: see MMT chart above Goal status: INITIAL  4. Pt will perform 5xSTS in <14 sec in order to demonstrate reduced fall risk and improved functional independence. (MCID of 2.3sec)  Baseline: 34sec standard chair weaning UE support after 2nd rep  Goal status: INITIAL   5. Pt will report ability to  stand/walk for up to 45 minutes with less than 2 pt increase in resting pain in order to facilitate improved tolerance to household activities and ADLs.   Baseline: 4/10 pain at best, requires frequent sitting breaks  Goal status INITIAL  PLAN:  PT FREQUENCY: 2x/week  PT DURATION: 8 weeks  PLANNED INTERVENTIONS: Therapeutic exercises, Therapeutic activity, Neuromuscular re-education, Balance training, Gait training, Patient/Family education, Self Care, Joint mobilization, Stair training, DME instructions, Aquatic Therapy, Dry Needling, Electrical stimulation, Spinal mobilization, Cryotherapy, Moist heat, Taping, Manual therapy, and Re-evaluation.  PLAN FOR NEXT SESSION: Progress ROM/strengthening exercises as able/appropriate, review HEP.   Ashley Murrain PT, DPT 05/02/2022 12:42 PM

## 2022-05-02 ENCOUNTER — Ambulatory Visit: Payer: 59 | Attending: Internal Medicine | Admitting: Physical Therapy

## 2022-05-02 ENCOUNTER — Other Ambulatory Visit: Payer: Self-pay

## 2022-05-02 ENCOUNTER — Encounter: Payer: Self-pay | Admitting: Physical Therapy

## 2022-05-02 DIAGNOSIS — M6281 Muscle weakness (generalized): Secondary | ICD-10-CM | POA: Diagnosis not present

## 2022-05-02 DIAGNOSIS — R2689 Other abnormalities of gait and mobility: Secondary | ICD-10-CM | POA: Insufficient documentation

## 2022-05-02 DIAGNOSIS — M5459 Other low back pain: Secondary | ICD-10-CM | POA: Insufficient documentation

## 2022-05-02 DIAGNOSIS — M545 Low back pain, unspecified: Secondary | ICD-10-CM | POA: Diagnosis not present

## 2022-05-02 DIAGNOSIS — R293 Abnormal posture: Secondary | ICD-10-CM | POA: Diagnosis not present

## 2022-05-02 DIAGNOSIS — M533 Sacrococcygeal disorders, not elsewhere classified: Secondary | ICD-10-CM | POA: Diagnosis not present

## 2022-05-08 NOTE — Therapy (Signed)
OUTPATIENT PHYSICAL THERAPY TREATMENT NOTE   Patient Name: Stephanie Neal MRN: [id number] DOB:02-27-1956, 67 y.o., female Today's Date: 05/09/2022  PCP: Marcine Matar, MD   REFERRING PROVIDER: Marcine Matar, MD  END OF SESSION:   PT End of Session - 05/09/22 0920     Visit Number 2    Number of Visits 17    Date for PT Re-Evaluation 06/27/22    Authorization Type U[employer]    PT Start Time 4098    PT Stop Time 1003    PT Time Calculation (min) 39 min    Activity Tolerance Patient tolerated treatment well;No increased pain    Behavior During Therapy WFL for tasks assessed/performed             Past Medical History:  Diagnosis Date   Diabetes mellitus    Hyperlipidemia    Hypertension    Past Surgical History:  Procedure Laterality Date   FOOT SURGERY     MICROLARYNGOSCOPY N/A 01/18/2022   Procedure: MICROLARYNGOSCOPY WITH EXCISION OF POLYPS;  Surgeon: Serena Colonel, MD;  Location: [facility] OR;  Service: ENT;  Laterality: N/A;   Patient Active Problem List   Diagnosis Date Noted   Aortic atherosclerosis (HCC) 05/05/2021   Tetanus, diphtheria, and acellular pertussis (Tdap) vaccination declined 05/10/2020   Hyperlipidemia associated with type 2 diabetes mellitus (HCC) 11/18/2019   Chronic pain of right knee 11/18/2019   Mixed hyperlipidemia 06/25/2018   Over weight 06/25/2018   Stress due to family tension 11/13/2017   DM type 2 (diabetes mellitus, type 2) (HCC) 03/05/2014   HYPERCHOLESTEROLEMIA 03/12/2007   OBESITY 03/12/2007   Tobacco dependence 03/12/2007   HTN (hypertension) 03/12/2007   ALLERGIC RHINITIS, SEASONAL 03/12/2007    REFERRING DIAG: M54.50 (ICD-10-CM) - Midline low back pain without sciatica, unspecified chronicity M53.3 (ICD-10-CM) - Coccyx pain  THERAPY DIAG:  Other low back pain  Abnormal posture  Other abnormalities of gait and mobility  Muscle weakness (generalized)  Rationale for Evaluation and Treatment  Rehabilitation  PERTINENT HISTORY: HTN, aortic atherosclerosis, DM2, tobacco use, throat surgery September 2023   PRECAUTIONS: none  PATIENT GOALS: reduce pain, get legs and back stronger    SUBJECTIVE:                                                                                                                                                                                      SUBJECTIVE STATEMENT:   05/09/2022 Pt states she has been doing HEP and has been helpful, no extra pain or soreness. Mild low back pain today, 3/10 pain. No other new updates    Eval - Pt was trying to sit down  in her kitchen when she missed the chair and fell on her bottom. Pt states since initial injury her symptoms have improved somewhat, has been doing exercises on her own which seem helpful. Pt notes most difficulty w/ stair navigation, cleaning around the house, dishes, etc. Has required increased sitting breaks with activity. No imaging performed yet although XR has been ordered. Pt describes extension based movements as being most painful although with repetition becomes less irritating. Denies LE referral, no N/T. Denies bowel/bladder issues, no saddle anesthesia. No fevers or night sweats.   PAIN:  Are you having pain 05/09/2022 : 3/10 Location: tailbone How would you describe your pain? sharp Best in past week: 4/10 Aggravating factors: stairs, moving the wrong way, standing Easing factors: movement    OBJECTIVE: (objective measures completed at initial evaluation unless otherwise dated)   DIAGNOSTIC FINDINGS:  Sacrum/coccyx and lumbar XR ordered, pending   PATIENT SURVEYS:  FOTO 77 current, 81 predicted   SCREENING FOR RED FLAGS: Unremarkable red flag screening    COGNITION: Overall cognitive status: Within functional limits for tasks assessed                          SENSATION: Light touch intact B LE     POSTURE: increased lordosis, elevated UT B   PALPATION: Significant TTP sacrum,  moderate tenderness distal QL bilaterally. No other midline tenderness or bony deformity noted, mild tightness B hips but no pain endorsed   LUMBAR ROM:    AROM eval  Flexion 100% p! To ankles  Extension 50% p!  Right lateral flexion    Left lateral flexion    Right rotation 75%  Left rotation 75%   (Blank rows = not tested) Comments: extension more painful than flexion, B rotation pt reports relief    LOWER EXTREMITY ROM:      Active  Right eval Left eval  Hip flexion      Hip extension      Hip internal rotation      Hip external rotation      (Blank rows = not tested) (Key: WFL = within functional limits not formally assessed, * = concordant pain, s = stiffness/stretching sensation, NT = not tested)  Comments:     LOWER EXTREMITY MMT:     MMT Right eval Left eval  Hip flexion 4 4  Hip abduction (modified sitting) 5 5  Hip internal rotation 3+ knee pain 4  Hip external rotation 3+ 3+  Knee flexion 4 4  Knee extension 4 4    (Blank rows = not tested) (Key: WFL = within functional limits not formally assessed, * = concordant pain, s = stiffness/stretching sensation, NT = not tested)  Comments: hip rotation weak but painless B      FUNCTIONAL TESTS:  5xSTS: 34sec standard chair, gentle UE support initially but weaned after 2nd rep. Increased velocity with fatigue although pt reports improvement in symptoms    GAIT: Distance walked: within clinic Assistive device utilized: None Level of assistance: Complete Independence Comments: partial step through pattern B, reduced trunk ROM and arm swing, reduced gait speed and cadence   TODAY'S TREATMENT:  [facility] Adult PT Treatment:                                                DATE: 05/09/22 Therapeutic Exercise: Adductor iso, seated, 3x10 cues for pacing (minimal rest breaks required) Seated TA activation 3x10 cues for form, breath  control, and posture  Seated thoracolumbar extension 2x10 cues for form and appropriate ROM Seated RTB hip abduction 3x10 cues for form and posture STS 2x5 lowest mat, cues for form and pacing, increased fwd trunk lean, appropriate BOS Swiss ball press down, standing, 2x12 cues for form and breath control HEP update/review - education w/ handout, provision of RTB                           [facility] Adult PT Treatment:                                                DATE: 05/02/22 Therapeutic Exercise: Adductor iso seated x10 cues for HEP and form TA activation seated x10 cues for HEP and form Verbal review of program and education for home performance   PATIENT EDUCATION:  Education details: rationale for interventions, HEP review/update, provided w/ red theraband Person educated: Patient Education method: Explanation, Demonstration, Tactile cues, Verbal cues, and Handouts Education comprehension: verbalized understanding, returned demonstration, verbal cues required, tactile cues required, and needs further education     HOME EXERCISE PROGRAM: Access Code: [facility] URL: [email or web address] Date: 05/09/2022 Prepared by: Enis Slipper  Exercises - Seated Transversus Abdominis Bracing  - 1 x daily - 7 x weekly - 3 sets - 10 reps - Seated Hip Adduction Isometrics with Ball  - 1 x daily - 7 x weekly - 3 sets - 10 reps - Seated Thoracic Lumbar Extension  - 1 x daily - 7 x weekly - 2 sets - 10 reps - Seated Hip Abduction with Resistance  - 1 x daily - 7 x weekly - 2 sets - 10 reps   ASSESSMENT:   CLINICAL IMPRESSION: 05/09/2022 Pt arrives w/ 3/10 pain in low back, states that she has been doing well with HEP and exercises seem to be helping.  Session initiated w/ HEP review, pt requires some cues for TA activation performance but overall does well with familiar exercise. Followed w/ exercises emphasizing comfortable core/hip activation. Addition of banded hip abduction and  thoracolumbar ext to HEP as above. No adverse events, pt tolerates session well with steady improvement in pain, rated at 0/10 on departure. Recommend continued skilled PT to address core stability/endurance to maximize functional tolerance. Pt departs today's session in no acute distress, all voiced questions/concerns addressed appropriately from PT perspective.     Eval - Patient is a 67 y.o. woman who was seen today for physical therapy evaluation and treatment for low back pain after a fall ~32months ago. Pt reports some improvement in symptoms with exercise but continues to have pain that is requiring her to modify daily activities and housework, increased pain with standing and extension based movements. On examination pt demonstrates limitations in lumbar extension and rotation, most pain with extension and report of mild relief w/ rotation. Pt also demonstrates general LE weakness but most notable  in B hip rotators. Tolerates session well without increase in resting pain, reports good relief with HEP. No adverse events. Recommend skilled PT to address aforementioned deficits to maximize functional tolerance and return to PLOF. Pt departs today's session in no acute distress, all voiced questions/concerns addressed appropriately from PT perspective.     OBJECTIVE IMPAIRMENTS: Abnormal gait, decreased activity tolerance, decreased mobility, difficulty walking, decreased ROM, decreased strength, hypomobility, increased muscle spasms, improper body mechanics, and pain.    ACTIVITY LIMITATIONS: carrying, lifting, bending, standing, squatting, stairs, transfers, and locomotion level   PARTICIPATION LIMITATIONS: meal prep, cleaning, laundry, and community activity   PERSONAL FACTORS: Age, Time since onset of injury/illness/exacerbation, and 1-2 comorbidities: HTN, DM2  are also affecting patient's functional outcome.    REHAB POTENTIAL: Good   CLINICAL DECISION MAKING: Stable/uncomplicated    EVALUATION COMPLEXITY: Low     GOALS: Goals reviewed with patient? No   SHORT TERM GOALS: Target date: 05/30/2022     Pt will demonstrate appropriate understanding and performance of initially prescribed HEP in order to facilitate improved independence with management of symptoms.  Baseline: HEP provided on eval Goal status: INITIAL    2. Pt will score greater than or equal to 79% on FOTO in order to demonstrate improved perception of function due to symptoms.            Baseline: 77%            Goal status: INITIAL     LONG TERM GOALS: Target date: 06/27/2022              Pt will score 81% on FOTO in order to demonstrate improved perception of functional status due to symptoms.  Baseline: 77% Goal status: INITIAL   2.  Pt will demonstrate full lumbar extension AROM with less than 3/10 pain on NPS in order to demonstrate improved tolerance to functional movement patterns.  Baseline: see ROM chart, 4/10 pain Goal status: INITIAL   3.  Pt will demonstrate hip rotational MMT of at least 4/5 bilaterally in order to demonstrate improved strength for functional movements.  Baseline: see MMT chart above Goal status: INITIAL   4. Pt will perform 5xSTS in <14 sec in order to demonstrate reduced fall risk and improved functional independence. (MCID of 2.3sec)            Baseline: 34sec standard chair weaning UE support after 2nd rep            Goal status: INITIAL    5. Pt will report ability to stand/walk for up to 45 minutes with less than 2 pt increase in resting pain in order to facilitate improved tolerance to household activities and ADLs.             Baseline: 4/10 pain at best, requires frequent sitting breaks            Goal status INITIAL   PLAN:   PT FREQUENCY: 2x/week   PT DURATION: 8 weeks   PLANNED INTERVENTIONS: Therapeutic exercises, Therapeutic activity, Neuromuscular re-education, Balance training, Gait training, Patient/Family education, Self Care, Joint  mobilization, Stair training, DME instructions, Aquatic Therapy, Dry Needling, Electrical stimulation, Spinal mobilization, Cryotherapy, Moist heat, Taping, Manual therapy, and Re-evaluation.   PLAN FOR NEXT SESSION: Progress ROM/strengthening exercises as able/appropriate, review HEP.    Ashley Murrain PT, DPT 05/09/2022 10:04 AM

## 2022-05-09 ENCOUNTER — Ambulatory Visit: Payer: 59 | Admitting: Physical Therapy

## 2022-05-09 ENCOUNTER — Encounter: Payer: Self-pay | Admitting: Physical Therapy

## 2022-05-09 DIAGNOSIS — M6281 Muscle weakness (generalized): Secondary | ICD-10-CM

## 2022-05-09 DIAGNOSIS — M533 Sacrococcygeal disorders, not elsewhere classified: Secondary | ICD-10-CM | POA: Diagnosis not present

## 2022-05-09 DIAGNOSIS — M545 Low back pain, unspecified: Secondary | ICD-10-CM | POA: Diagnosis not present

## 2022-05-09 DIAGNOSIS — R2689 Other abnormalities of gait and mobility: Secondary | ICD-10-CM | POA: Diagnosis not present

## 2022-05-09 DIAGNOSIS — R293 Abnormal posture: Secondary | ICD-10-CM | POA: Diagnosis not present

## 2022-05-09 DIAGNOSIS — M5459 Other low back pain: Secondary | ICD-10-CM | POA: Diagnosis not present

## 2022-05-10 ENCOUNTER — Ambulatory Visit
Admission: RE | Admit: 2022-05-10 | Discharge: 2022-05-10 | Disposition: A | Discharge status: Home / Self Care | Payer: Medicare Other | Source: Ambulatory Visit | Attending: Internal Medicine | Admitting: Internal Medicine

## 2022-05-10 DIAGNOSIS — M545 Low back pain, unspecified: Secondary | ICD-10-CM

## 2022-05-10 DIAGNOSIS — M533 Sacrococcygeal disorders, not elsewhere classified: Secondary | ICD-10-CM | POA: Diagnosis not present

## 2022-05-10 NOTE — Therapy (Signed)
OUTPATIENT PHYSICAL THERAPY TREATMENT NOTE   Patient Name: Stephanie Neal MRN: [id number] DOB:Dec 16, 1955, 67 y.o., female Today's Date: 05/11/2022  PCP: Ladell Pier, MD   REFERRING PROVIDER: Ladell Pier, MD  END OF SESSION:   PT End of Session - 05/11/22 1015     Visit Number 3    Number of Visits 17    Date for PT Re-Evaluation 06/27/22    Authorization Type UHC    PT Start Time 3536    PT Stop Time 1101    PT Time Calculation (min) 46 min    Activity Tolerance Patient tolerated treatment well;No increased pain    Behavior During Therapy WFL for tasks assessed/performed              Past Medical History:  Diagnosis Date   Diabetes mellitus    Hyperlipidemia    Hypertension    Past Surgical History:  Procedure Laterality Date   FOOT SURGERY     MICROLARYNGOSCOPY N/A 01/18/2022   Procedure: MICROLARYNGOSCOPY WITH EXCISION OF POLYPS;  Surgeon: Izora Gala, MD;  Location: [facility] OR;  Service: ENT;  Laterality: N/A;   Patient Active Problem List   Diagnosis Date Noted   Aortic atherosclerosis ([place]) 05/05/2021   Tetanus, diphtheria, and acellular pertussis (Tdap) vaccination declined 05/10/2020   Hyperlipidemia associated with type 2 diabetes mellitus ([place]) 11/18/2019   Chronic pain of right knee 11/18/2019   Mixed hyperlipidemia 06/25/2018   Over weight 06/25/2018   Stress due to family tension 11/13/2017   DM type 2 (diabetes mellitus, type 2) ([place]) 03/05/2014   HYPERCHOLESTEROLEMIA 03/12/2007   OBESITY 03/12/2007   Tobacco dependence 03/12/2007   HTN (hypertension) 03/12/2007   ALLERGIC RHINITIS, SEASONAL 03/12/2007    REFERRING DIAG: M54.50 (ICD-10-CM) - Midline low back pain without sciatica, unspecified chronicity M53.3 (ICD-10-CM) - Coccyx pain  THERAPY DIAG:  Other low back pain  Abnormal posture  Other abnormalities of gait and mobility  Muscle weakness (generalized)  Rationale for Evaluation and Treatment  Rehabilitation  PERTINENT HISTORY: HTN, aortic atherosclerosis, DM2, tobacco use, throat surgery September 2023   PRECAUTIONS: none  PATIENT GOALS: reduce pain, get legs and back stronger    SUBJECTIVE:                                                                                                                                                                                      SUBJECTIVE STATEMENT:   05/11/2022 States she is doing well today, denies any overt pain, describes instead as soreness. Excellent relief after last session, notes she is now able to stand more upright . Notes extension exercises have been very helpful for  her. Good HEP adherence.  Got XR yesterday, results pending   Eval - Pt was trying to sit down in her kitchen when she missed the chair and fell on her bottom. Pt states since initial injury her symptoms have improved somewhat, has been doing exercises on her own which seem helpful. Pt notes most difficulty w/ stair navigation, cleaning around the house, dishes, etc. Has required increased sitting breaks with activity. No imaging performed yet although XR has been ordered. Pt describes extension based movements as being most painful although with repetition becomes less irritating. Denies LE referral, no N/T. Denies bowel/bladder issues, no saddle anesthesia. No fevers or night sweats.   PAIN:  Are you having pain 05/11/2022 : 0/10 Location: tailbone How would you describe your pain? sharp Best in past week: 4/10 Aggravating factors: stairs, moving the wrong way, standing Easing factors: movement    OBJECTIVE: (objective measures completed at initial evaluation unless otherwise dated)   DIAGNOSTIC FINDINGS:  Sacrum/coccyx and lumbar XR ordered, pending   PATIENT SURVEYS:  FOTO 77 current, 81 predicted   SCREENING FOR RED FLAGS: Unremarkable red flag screening    COGNITION: Overall cognitive status: Within functional limits for tasks assessed                           SENSATION: Light touch intact B LE     POSTURE: increased lordosis, elevated UT B   PALPATION: Significant TTP sacrum, moderate tenderness distal QL bilaterally. No other midline tenderness or bony deformity noted, mild tightness B hips but no pain endorsed   LUMBAR ROM:    AROM eval  Flexion 100% p! To ankles  Extension 50% p!  Right lateral flexion    Left lateral flexion    Right rotation 75%  Left rotation 75%   (Blank rows = not tested) Comments: extension more painful than flexion, B rotation pt reports relief    LOWER EXTREMITY ROM:      Active  Right eval Left eval  Hip flexion      Hip extension      Hip internal rotation      Hip external rotation      (Blank rows = not tested) (Key: WFL = within functional limits not formally assessed, * = concordant pain, s = stiffness/stretching sensation, NT = not tested)  Comments:     LOWER EXTREMITY MMT:     MMT Right eval Left eval  Hip flexion 4 4  Hip abduction (modified sitting) 5 5  Hip internal rotation 3+ knee pain 4  Hip external rotation 3+ 3+  Knee flexion 4 4  Knee extension 4 4    (Blank rows = not tested) (Key: WFL = within functional limits not formally assessed, * = concordant pain, s = stiffness/stretching sensation, NT = not tested)  Comments: hip rotation weak but painless B      FUNCTIONAL TESTS:  5xSTS: 34sec standard chair, gentle UE support initially but weaned after 2nd rep. Increased velocity with fatigue although pt reports improvement in symptoms    GAIT: Distance walked: within clinic Assistive device utilized: None Level of assistance: Complete Independence Comments: partial step through pattern B, reduced trunk ROM and arm swing, reduced gait speed and cadence   TODAY'S TREATMENT:  [facility] Adult PT Treatment:                                                DATE:  05/11/22 Therapeutic Exercise: Bridge + ball squeeze 2x10 cues for form and breath control  Swiss ball press down 2x12 cues for form and breath control  Seated RTB abduction 2x15 cues for setup Seated RTB IR (ball between knees) 2x8 cues for setup and form  STS from lowest mat + 1 airex, x8, cues for form and trunk lean Seated thoracolumbar ext x12 cues for form and appropriate ROM     [facility] Adult PT Treatment:                                                DATE: 05/09/22 Therapeutic Exercise: Adductor iso, seated, 3x10 cues for pacing (minimal rest breaks required) Seated TA activation 3x10 cues for form, breath control, and posture  Seated thoracolumbar extension 2x10 cues for form and appropriate ROM Seated RTB hip abduction 3x10 cues for form and posture STS 2x5 lowest mat, cues for form and pacing, increased fwd trunk lean, appropriate BOS Swiss ball press down, standing, 2x12 cues for form and breath control HEP update/review - education w/ handout, provision of RTB                           [facility] Adult PT Treatment:                                                DATE: 05/02/22 Therapeutic Exercise: Adductor iso seated x10 cues for HEP and form TA activation seated x10 cues for HEP and form Verbal review of program and education for home performance   PATIENT EDUCATION:  Education details: rationale for interventions, HEP review/update, provided w/ red theraband Person educated: Patient Education method: Explanation, Demonstration, Tactile cues, Verbal cues, and Handouts Education comprehension: verbalized understanding, returned demonstration, verbal cues required, tactile cues required, and needs further education     HOME EXERCISE PROGRAM: Access Code: [facility] URL: [email or web address] Date: 05/09/2022 Prepared by: Enis Slipper  Exercises - Seated Transversus Abdominis Bracing  - 1 x daily - 7 x weekly - 3 sets - 10 reps - Seated Hip Adduction Isometrics  with Ball  - 1 x daily - 7 x weekly - 3 sets - 10 reps - Seated Thoracic Lumbar Extension  - 1 x daily - 7 x weekly - 2 sets - 10 reps - Seated Hip Abduction with Resistance  - 1 x daily - 7 x weekly - 2 sets - 10 reps   ASSESSMENT:   CLINICAL IMPRESSION: 05/11/2022 Pt arrives w/o pain, reports excellent relief after last session, greatest benefit from extension based exercises at home. Given positive response thus far, continued with familiar program, progressed as appropriate for increased complexity and intensity of exercise. Pt tolerates session quite well without any pain, reports improvement in soreness as session goes on. Reinforced HEP. No adverse events. Pt departs today's session in no acute distress, all voiced questions/concerns addressed appropriately from PT  perspective.    Eval - Patient is a 67 y.o. woman who was seen today for physical therapy evaluation and treatment for low back pain after a fall ~87months ago. Pt reports some improvement in symptoms with exercise but continues to have pain that is requiring her to modify daily activities and housework, increased pain with standing and extension based movements. On examination pt demonstrates limitations in lumbar extension and rotation, most pain with extension and report of mild relief w/ rotation. Pt also demonstrates general LE weakness but most notable in B hip rotators. Tolerates session well without increase in resting pain, reports good relief with HEP. No adverse events. Recommend skilled PT to address aforementioned deficits to maximize functional tolerance and return to PLOF. Pt departs today's session in no acute distress, all voiced questions/concerns addressed appropriately from PT perspective.     OBJECTIVE IMPAIRMENTS: Abnormal gait, decreased activity tolerance, decreased mobility, difficulty walking, decreased ROM, decreased strength, hypomobility, increased muscle spasms, improper body mechanics, and pain.     ACTIVITY LIMITATIONS: carrying, lifting, bending, standing, squatting, stairs, transfers, and locomotion level   PARTICIPATION LIMITATIONS: meal prep, cleaning, laundry, and community activity   PERSONAL FACTORS: Age, Time since onset of injury/illness/exacerbation, and 1-2 comorbidities: HTN, DM2  are also affecting patient's functional outcome.    REHAB POTENTIAL: Good   CLINICAL DECISION MAKING: Stable/uncomplicated   EVALUATION COMPLEXITY: Low     GOALS: Goals reviewed with patient? No   SHORT TERM GOALS: Target date: 05/30/2022     Pt will demonstrate appropriate understanding and performance of initially prescribed HEP in order to facilitate improved independence with management of symptoms.  Baseline: HEP provided on eval Goal status: INITIAL    2. Pt will score greater than or equal to 79% on FOTO in order to demonstrate improved perception of function due to symptoms.            Baseline: 77%            Goal status: INITIAL     LONG TERM GOALS: Target date: 06/27/2022              Pt will score 81% on FOTO in order to demonstrate improved perception of functional status due to symptoms.  Baseline: 77% Goal status: INITIAL   2.  Pt will demonstrate full lumbar extension AROM with less than 3/10 pain on NPS in order to demonstrate improved tolerance to functional movement patterns.  Baseline: see ROM chart, 4/10 pain Goal status: INITIAL   3.  Pt will demonstrate hip rotational MMT of at least 4/5 bilaterally in order to demonstrate improved strength for functional movements.  Baseline: see MMT chart above Goal status: INITIAL   4. Pt will perform 5xSTS in <14 sec in order to demonstrate reduced fall risk and improved functional independence. (MCID of 2.3sec)            Baseline: 34sec standard chair weaning UE support after 2nd rep            Goal status: INITIAL    5. Pt will report ability to stand/walk for up to 45 minutes with less than 2 pt increase in  resting pain in order to facilitate improved tolerance to household activities and ADLs.             Baseline: 4/10 pain at best, requires frequent sitting breaks            Goal status INITIAL   PLAN:   PT FREQUENCY: 2x/week  PT DURATION: 8 weeks   PLANNED INTERVENTIONS: Therapeutic exercises, Therapeutic activity, Neuromuscular re-education, Balance training, Gait training, Patient/Family education, Self Care, Joint mobilization, Stair training, DME instructions, Aquatic Therapy, Dry Needling, Electrical stimulation, Spinal mobilization, Cryotherapy, Moist heat, Taping, Manual therapy, and Re-evaluation.   PLAN FOR NEXT SESSION:  Progress ROM/strengthening exercises as able/appropriate, review HEP.    Ashley Murrain PT, DPT 05/11/2022 11:09 AM

## 2022-05-11 ENCOUNTER — Encounter: Payer: Self-pay | Admitting: Physical Therapy

## 2022-05-11 ENCOUNTER — Ambulatory Visit: Payer: 59 | Admitting: Physical Therapy

## 2022-05-11 DIAGNOSIS — M533 Sacrococcygeal disorders, not elsewhere classified: Secondary | ICD-10-CM | POA: Diagnosis not present

## 2022-05-11 DIAGNOSIS — M6281 Muscle weakness (generalized): Secondary | ICD-10-CM

## 2022-05-11 DIAGNOSIS — R2689 Other abnormalities of gait and mobility: Secondary | ICD-10-CM | POA: Diagnosis not present

## 2022-05-11 DIAGNOSIS — M5459 Other low back pain: Secondary | ICD-10-CM

## 2022-05-11 DIAGNOSIS — M545 Low back pain, unspecified: Secondary | ICD-10-CM | POA: Diagnosis not present

## 2022-05-11 DIAGNOSIS — R293 Abnormal posture: Secondary | ICD-10-CM | POA: Diagnosis not present

## 2022-05-12 NOTE — Therapy (Signed)
OUTPATIENT PHYSICAL THERAPY TREATMENT NOTE   Patient Name: Stephanie Neal MRN: [id number] DOB:1956/03/25, 67 y.o., female Today's Date: 05/15/2022  PCP: Ladell Pier, MD   REFERRING PROVIDER: Ladell Pier, MD  END OF SESSION:   PT End of Session - 05/15/22 1015     Visit Number 4    Number of Visits 17    Date for PT Re-Evaluation 06/27/22    Authorization Type [employer]    PT Start Time 8295    PT Stop Time 1054    PT Time Calculation (min) 39 min    Activity Tolerance Patient tolerated treatment well;No increased pain    Behavior During Therapy WFL for tasks assessed/performed               Past Medical History:  Diagnosis Date   Diabetes mellitus    Hyperlipidemia    Hypertension    Past Surgical History:  Procedure Laterality Date   FOOT SURGERY     MICROLARYNGOSCOPY N/A 01/18/2022   Procedure: MICROLARYNGOSCOPY WITH EXCISION OF POLYPS;  Surgeon: Izora Gala, MD;  Location: [facility] OR;  Service: ENT;  Laterality: N/A;   Patient Active Problem List   Diagnosis Date Noted   Aortic atherosclerosis ([place]) 05/05/2021   Tetanus, diphtheria, and acellular pertussis (Tdap) vaccination declined 05/10/2020   Hyperlipidemia associated with type 2 diabetes mellitus ([place]) 11/18/2019   Chronic pain of right knee 11/18/2019   Mixed hyperlipidemia 06/25/2018   Over weight 06/25/2018   Stress due to family tension 11/13/2017   DM type 2 (diabetes mellitus, type 2) ([place]) 03/05/2014   HYPERCHOLESTEROLEMIA 03/12/2007   OBESITY 03/12/2007   Tobacco dependence 03/12/2007   HTN (hypertension) 03/12/2007   ALLERGIC RHINITIS, SEASONAL 03/12/2007    REFERRING DIAG: M54.50 (ICD-10-CM) - Midline low back pain without sciatica, unspecified chronicity M53.3 (ICD-10-CM) - Coccyx pain  THERAPY DIAG:  Other low back pain  Abnormal posture  Other abnormalities of gait and mobility  Muscle weakness (generalized)  Rationale for Evaluation and Treatment  Rehabilitation  PERTINENT HISTORY: HTN, aortic atherosclerosis, DM2, tobacco use, throat surgery September 2023   PRECAUTIONS: none  PATIENT GOALS: reduce pain, get legs and back stronger    SUBJECTIVE:                                                                                                                                                                                      SUBJECTIVE STATEMENT:   05/15/2022 Pt arrives w/o pain at present, denies any pain since last session. HEP going well   Eval - Pt was trying to sit down in her kitchen when she missed the chair and fell on  her bottom. Pt states since initial injury her symptoms have improved somewhat, has been doing exercises on her own which seem helpful. Pt notes most difficulty w/ stair navigation, cleaning around the house, dishes, etc. Has required increased sitting breaks with activity. No imaging performed yet although XR has been ordered. Pt describes extension based movements as being most painful although with repetition becomes less irritating. Denies LE referral, no N/T. Denies bowel/bladder issues, no saddle anesthesia. No fevers or night sweats.   PAIN:  Are you having pain 05/15/2022 : 0/10 Location: tailbone How would you describe your pain? sharp Best in past week: 0/10 (4/10 on eval) Worst in past week: 4/10  Aggravating factors: stairs, moving the wrong way, standing Easing factors: movement    OBJECTIVE: (objective measures completed at initial evaluation unless otherwise dated)   DIAGNOSTIC FINDINGS:  Sacrum/coccyx and lumbar XR ordered, pending 05/15/22: No fracture per XR   PATIENT SURVEYS:  FOTO 77 current, 81 predicted   SCREENING FOR RED FLAGS: Unremarkable red flag screening    COGNITION: Overall cognitive status: Within functional limits for tasks assessed                          SENSATION: Light touch intact B LE     POSTURE: increased lordosis, elevated UT B    PALPATION: Significant TTP sacrum, moderate tenderness distal QL bilaterally. No other midline tenderness or bony deformity noted, mild tightness B hips but no pain endorsed   LUMBAR ROM:    AROM eval 05/15/22  Flexion 100% p! To ankles 100% no pain, touches top of feet  Extension 50% p! 50% mild pain  Right lateral flexion     Left lateral flexion     Right rotation 75% 100%  Left rotation 75% 100%   (Blank rows = not tested) Comments: extension more painful than flexion, B rotation pt reports relief    LOWER EXTREMITY ROM:      Active  Right eval Left eval  Hip flexion      Hip extension      Hip internal rotation      Hip external rotation      (Blank rows = not tested) (Key: WFL = within functional limits not formally assessed, * = concordant pain, s = stiffness/stretching sensation, NT = not tested)  Comments:     LOWER EXTREMITY MMT:     MMT Right eval Left eval  Hip flexion 4 4  Hip abduction (modified sitting) 5 5  Hip internal rotation 3+ knee pain 4  Hip external rotation 3+ 3+  Knee flexion 4 4  Knee extension 4 4    (Blank rows = not tested) (Key: WFL = within functional limits not formally assessed, * = concordant pain, s = stiffness/stretching sensation, NT = not tested)  Comments: hip rotation weak but painless B      FUNCTIONAL TESTS:  5xSTS: 34sec standard chair, gentle UE support initially but weaned after 2nd rep. Increased velocity with fatigue although pt reports improvement in symptoms    GAIT: Distance walked: within clinic Assistive device utilized: None Level of assistance: Complete Independence Comments: partial step through pattern B, reduced trunk ROM and arm swing, reduced gait speed and cadence   TODAY'S TREATMENT:  [facility] Adult PT Treatment:                                                DATE: 05/15/22 Therapeutic Exercise: Bridge + ball  squeeze 3x10 cues for setup and sequencing seated marches 2x10 each LE cues for form and breath control, core contraction Seated GTB IR with ball between knees 2x8 cues for form  STS raised mat w 5# KB 2x8 cues for BOS and trunk mechanics RTB paloff press x8 each way cues for posture  Standing thoracolumbar extension against swiss ball at wall x12 cues for form  HEP update/review + handout   [facility] Adult PT Treatment:                                                DATE: 05/11/22 Therapeutic Exercise: Bridge + ball squeeze 2x10 cues for form and breath control  Swiss ball press down 2x12 cues for form and breath control  Seated RTB abduction 2x15 cues for setup Seated RTB IR (ball between knees) 2x8 cues for setup and form  STS from lowest mat + 1 airex, x8, cues for form and trunk lean Seated thoracolumbar ext x12 cues for form and appropriate ROM     [facility] Adult PT Treatment:                                                DATE: 05/09/22 Therapeutic Exercise: Adductor iso, seated, 3x10 cues for pacing (minimal rest breaks required) Seated TA activation 3x10 cues for form, breath control, and posture  Seated thoracolumbar extension 2x10 cues for form and appropriate ROM Seated RTB hip abduction 3x10 cues for form and posture STS 2x5 lowest mat, cues for form and pacing, increased fwd trunk lean, appropriate BOS Swiss ball press down, standing, 2x12 cues for form and breath control HEP update/review - education w/ handout, provision of RTB                      PATIENT EDUCATION:  Education details: rationale for interventions, HEP review Person educated: Patient Education method: Explanation, Demonstration, Tactile cues, Verbal cues, and Handouts Education comprehension: verbalized understanding, returned demonstration, verbal cues required, tactile cues required, and needs further education     HOME EXERCISE PROGRAM: Access Code: [facility] URL:  [email or web address] Date: 05/15/2022 Prepared by: Enis Slipper  Exercises - Seated Hip Adduction Isometrics with Diona Foley  - 1 x daily - 7 x weekly - 3 sets - 10 reps - Seated Thoracic Lumbar Extension  - 1 x daily - 7 x weekly - 2 sets - 10 reps - Seated Hip Abduction with Resistance  - 1 x daily - 7 x weekly - 2 sets - 10 reps - Supine March  - 1 x daily - 7 x weekly - 2 sets - 10 reps   ASSESSMENT:   CLINICAL IMPRESSION: 05/15/2022 Pt arrives w/o pain, denies any pain since last session, reports good adherence w/ HEP. Improvements in lumbar AROM as noted above, significantly less pain overall compared to initial eval. Continued w/ familiar program with progression  for volume and increased extension ROM, lumbar strengthening as able/appropriate. Pt tolerates session well without pain, primary report of muscle fatigue. Reports greatest benefit from paloff press and swiss ball extension at wall. No adverse events. Updated HEP as above. Pt departs today's session in no acute distress, all voiced questions/concerns addressed appropriately from PT perspective.       Eval - Patient is a 67 y.o. woman who was seen today for physical therapy evaluation and treatment for low back pain after a fall ~29months ago. Pt reports some improvement in symptoms with exercise but continues to have pain that is requiring her to modify daily activities and housework, increased pain with standing and extension based movements. On examination pt demonstrates limitations in lumbar extension and rotation, most pain with extension and report of mild relief w/ rotation. Pt also demonstrates general LE weakness but most notable in B hip rotators. Tolerates session well without increase in resting pain, reports good relief with HEP. No adverse events. Recommend skilled PT to address aforementioned deficits to maximize functional tolerance and return to PLOF. Pt departs today's session in no acute distress, all  voiced questions/concerns addressed appropriately from PT perspective.     OBJECTIVE IMPAIRMENTS: Abnormal gait, decreased activity tolerance, decreased mobility, difficulty walking, decreased ROM, decreased strength, hypomobility, increased muscle spasms, improper body mechanics, and pain.    ACTIVITY LIMITATIONS: carrying, lifting, bending, standing, squatting, stairs, transfers, and locomotion level   PARTICIPATION LIMITATIONS: meal prep, cleaning, laundry, and community activity   PERSONAL FACTORS: Age, Time since onset of injury/illness/exacerbation, and 1-2 comorbidities: HTN, DM2  are also affecting patient's functional outcome.    REHAB POTENTIAL: Good   CLINICAL DECISION MAKING: Stable/uncomplicated   EVALUATION COMPLEXITY: Low     GOALS: Goals reviewed with patient? No   SHORT TERM GOALS: Target date: 05/30/2022     Pt will demonstrate appropriate understanding and performance of initially prescribed HEP in order to facilitate improved independence with management of symptoms.  Baseline: HEP provided on eval Goal status: INITIAL    2. Pt will score greater than or equal to 79% on FOTO in order to demonstrate improved perception of function due to symptoms.            Baseline: 77%            Goal status: INITIAL     LONG TERM GOALS: Target date: 06/27/2022              Pt will score 81% on FOTO in order to demonstrate improved perception of functional status due to symptoms.  Baseline: 77% Goal status: INITIAL   2.  Pt will demonstrate full lumbar extension AROM with less than 3/10 pain on NPS in order to demonstrate improved tolerance to functional movement patterns.  Baseline: see ROM chart, 4/10 pain Goal status: INITIAL   3.  Pt will demonstrate hip rotational MMT of at least 4/5 bilaterally in order to demonstrate improved strength for functional movements.  Baseline: see MMT chart above Goal status: INITIAL   4. Pt will perform 5xSTS in <14 sec in order to  demonstrate reduced fall risk and improved functional independence. (MCID of 2.3sec)            Baseline: 34sec standard chair weaning UE support after 2nd rep            Goal status: INITIAL    5. Pt will report ability to stand/walk for up to 45 minutes with less than 2 pt increase in  resting pain in order to facilitate improved tolerance to household activities and ADLs.             Baseline: 4/10 pain at best, requires frequent sitting breaks            Goal status INITIAL   PLAN:   PT FREQUENCY: 2x/week   PT DURATION: 8 weeks   PLANNED INTERVENTIONS: Therapeutic exercises, Therapeutic activity, Neuromuscular re-education, Balance training, Gait training, Patient/Family education, Self Care, Joint mobilization, Stair training, DME instructions, Aquatic Therapy, Dry Needling, Electrical stimulation, Spinal mobilization, Cryotherapy, Moist heat, Taping, Manual therapy, and Re-evaluation.   PLAN FOR NEXT SESSION:  Progress ROM/strengthening exercises as able/appropriate, review HEP. Consider addition of banded paloff press to HEP as pt reports excellent response in 05/15/22 session   Ashley Murrain PT, DPT 05/15/2022 10:56 AM

## 2022-05-15 ENCOUNTER — Ambulatory Visit: Payer: 59 | Admitting: Physical Therapy

## 2022-05-15 ENCOUNTER — Encounter: Payer: Self-pay | Admitting: Physical Therapy

## 2022-05-15 DIAGNOSIS — M5459 Other low back pain: Secondary | ICD-10-CM

## 2022-05-15 DIAGNOSIS — M533 Sacrococcygeal disorders, not elsewhere classified: Secondary | ICD-10-CM | POA: Diagnosis not present

## 2022-05-15 DIAGNOSIS — M6281 Muscle weakness (generalized): Secondary | ICD-10-CM | POA: Diagnosis not present

## 2022-05-15 DIAGNOSIS — M545 Low back pain, unspecified: Secondary | ICD-10-CM | POA: Diagnosis not present

## 2022-05-15 DIAGNOSIS — R293 Abnormal posture: Secondary | ICD-10-CM | POA: Diagnosis not present

## 2022-05-15 DIAGNOSIS — R2689 Other abnormalities of gait and mobility: Secondary | ICD-10-CM | POA: Diagnosis not present

## 2022-05-18 ENCOUNTER — Encounter: Payer: Self-pay | Admitting: Physical Therapy

## 2022-05-18 ENCOUNTER — Other Ambulatory Visit: Payer: Self-pay | Admitting: Internal Medicine

## 2022-05-18 ENCOUNTER — Ambulatory Visit: Payer: 59 | Admitting: Physical Therapy

## 2022-05-18 DIAGNOSIS — M5459 Other low back pain: Secondary | ICD-10-CM

## 2022-05-18 DIAGNOSIS — M6281 Muscle weakness (generalized): Secondary | ICD-10-CM

## 2022-05-18 DIAGNOSIS — I152 Hypertension secondary to endocrine disorders: Secondary | ICD-10-CM

## 2022-05-18 DIAGNOSIS — R293 Abnormal posture: Secondary | ICD-10-CM | POA: Diagnosis not present

## 2022-05-18 DIAGNOSIS — R2689 Other abnormalities of gait and mobility: Secondary | ICD-10-CM | POA: Diagnosis not present

## 2022-05-18 DIAGNOSIS — M545 Low back pain, unspecified: Secondary | ICD-10-CM | POA: Diagnosis not present

## 2022-05-18 DIAGNOSIS — M533 Sacrococcygeal disorders, not elsewhere classified: Secondary | ICD-10-CM | POA: Diagnosis not present

## 2022-05-18 NOTE — Therapy (Signed)
OUTPATIENT PHYSICAL THERAPY TREATMENT NOTE   Patient Name: Stephanie Neal MRN: [id number] DOB:[birth year], 67 y.o., female Today's Date: 05/18/2022  PCP: Ladell Pier, MD   REFERRING PROVIDER: Ladell Pier, MD  END OF SESSION:   PT End of Session - 05/18/22 1020     Visit Number 5    Number of Visits 17    Date for PT Re-Evaluation 06/27/22    Authorization Type [employer]    PT Start Time 1020    PT Stop Time 1100    PT Time Calculation (min) 40 min               Past Medical History:  Diagnosis Date   Diabetes mellitus    Hyperlipidemia    Hypertension    Past Surgical History:  Procedure Laterality Date   FOOT SURGERY     MICROLARYNGOSCOPY N/A 01/18/2022   Procedure: MICROLARYNGOSCOPY WITH EXCISION OF POLYPS;  Surgeon: Izora Gala, MD;  Location: [facility];  Service: ENT;  Laterality: N/A;   Patient Active Problem List   Diagnosis Date Noted   Aortic atherosclerosis ([place]) 05/05/2021   Tetanus, diphtheria, and acellular pertussis (Tdap) vaccination declined 05/10/2020   Hyperlipidemia associated with type 2 diabetes mellitus ([place]) 11/18/2019   Chronic pain of right knee 11/18/2019   Mixed hyperlipidemia 06/25/2018   Over weight 06/25/2018   Stress due to family tension 11/13/2017   DM type 2 (diabetes mellitus, type 2) ([place]) 03/05/2014   HYPERCHOLESTEROLEMIA 03/12/2007   OBESITY 03/12/2007   Tobacco dependence 03/12/2007   HTN (hypertension) 03/12/2007   ALLERGIC RHINITIS, SEASONAL 03/12/2007    REFERRING DIAG: M54.50 (ICD-10-CM) - Midline low back pain without sciatica, unspecified chronicity M53.3 (ICD-10-CM) - Coccyx pain  THERAPY DIAG:  Other low back pain  Abnormal posture  Muscle weakness (generalized)  Other abnormalities of gait and mobility  Rationale for Evaluation and Treatment Rehabilitation  PERTINENT HISTORY: HTN, aortic atherosclerosis, DM2, tobacco use, throat surgery September 2023   PRECAUTIONS: none  PATIENT  GOALS: reduce pain, get legs and back stronger    SUBJECTIVE:                                                                                                                                                                                      SUBJECTIVE STATEMENT:   05/18/2022 Pt arrives w/o pain at present, denies any pain since last session. HEP going well   Eval - Pt was trying to sit down in her kitchen when she missed the chair and fell on her bottom. Pt states since initial injury her symptoms have improved somewhat, has been doing exercises on her own which seem  helpful. Pt notes most difficulty w/ stair navigation, cleaning around the house, dishes, etc. Has required increased sitting breaks with activity. No imaging performed yet although XR has been ordered. Pt describes extension based movements as being most painful although with repetition becomes less irritating. Denies LE referral, no N/T. Denies bowel/bladder issues, no saddle anesthesia. No fevers or night sweats.   PAIN:  Are you having pain 05/18/2022 : 0/10 Location: tailbone How would you describe your pain? sharp Best in past week: 0/10 (4/10 on eval) Worst in past week: 0/10  Aggravating factors: stairs, moving the wrong way, standing Easing factors: movement    OBJECTIVE: (objective measures completed at initial evaluation unless otherwise dated)   DIAGNOSTIC FINDINGS:  Sacrum/coccyx and lumbar XR ordered, pending 05/15/22: No fracture per XR   PATIENT SURVEYS:  FOTO 77 current, 81 predicted   SCREENING FOR RED FLAGS: Unremarkable red flag screening    COGNITION: Overall cognitive status: Within functional limits for tasks assessed                          SENSATION: Light touch intact B LE     POSTURE: increased lordosis, elevated UT B   PALPATION: Significant TTP sacrum, moderate tenderness distal QL bilaterally. No other midline tenderness or bony deformity noted, mild tightness B hips but no pain  endorsed   LUMBAR ROM:    AROM eval 05/15/22  Flexion 100% p! To ankles 100% no pain, touches top of feet  Extension 50% p! 50% mild pain  Right lateral flexion     Left lateral flexion     Right rotation 75% 100%  Left rotation 75% 100%   (Blank rows = not tested) Comments: extension more painful than flexion, B rotation pt reports relief    LOWER EXTREMITY ROM:      Active  Right eval Left eval  Hip flexion      Hip extension      Hip internal rotation      Hip external rotation      (Blank rows = not tested) (Key: WFL = within functional limits not formally assessed, * = concordant pain, s = stiffness/stretching sensation, NT = not tested)  Comments:     LOWER EXTREMITY MMT:     MMT Right eval Left eval  Hip flexion 4 4  Hip abduction (modified sitting) 5 5  Hip internal rotation 3+ knee pain 4  Hip external rotation 3+ 3+  Knee flexion 4 4  Knee extension 4 4    (Blank rows = not tested) (Key: WFL = within functional limits not formally assessed, * = concordant pain, s = stiffness/stretching sensation, NT = not tested)  Comments: hip rotation weak but painless B      FUNCTIONAL TESTS:  5xSTS: 34sec standard chair, gentle UE support initially but weaned after 2nd rep. Increased velocity with fatigue although pt reports improvement in symptoms  05/18/22: 24.3 sec , no UE, c/o right knee pain.    GAIT: Distance walked: within clinic Assistive device utilized: None Level of assistance: Complete Independence Comments: partial step through pattern B, reduced trunk ROM and arm swing, reduced gait speed and cadence   TODAY'S TREATMENT:  [facility] Adult PT Treatment:                                                DATE: 05/18/22 Therapeutic Exercise: STS 5 x 2  Palloff press single green 10 x 2 Standing Row green x 15  Standing thoracolumbar extension against swiss ball at wall  x12 cues for form , added OH reach BTB supine clam 10 x 2  BTB banded bridge 10 x 2  BTB supine marching 10 x 1 alternating  - cues for abdominal draw in  Side hip abduction x 10 each Side clam x 10 each  Instruction in palloff press for HEP   [facility] Adult PT Treatment:                                                DATE: 05/15/22 Therapeutic Exercise: Bridge + ball squeeze 3x10 cues for setup and sequencing seated marches 2x10 each LE cues for form and breath control, core contraction Seated GTB IR with ball between knees 2x8 cues for form  STS raised mat w 5# KB 2x8 cues for BOS and trunk mechanics RTB paloff press x8 each way cues for posture  Standing thoracolumbar extension against swiss ball at wall x12 cues for form  HEP update/review + handout   [facility] Adult PT Treatment:                                                DATE: 05/11/22 Therapeutic Exercise: Bridge + ball squeeze 2x10 cues for form and breath control  Swiss ball press down 2x12 cues for form and breath control  Seated RTB abduction 2x15 cues for setup Seated RTB IR (ball between knees) 2x8 cues for setup and form  STS from lowest mat + 1 airex, x8, cues for form and trunk lean Seated thoracolumbar ext x12 cues for form and appropriate ROM     [facility] Adult PT Treatment:                                                DATE: 05/09/22 Therapeutic Exercise: Adductor iso, seated, 3x10 cues for pacing (minimal rest breaks required) Seated TA activation 3x10 cues for form, breath control, and posture  Seated thoracolumbar extension 2x10 cues for form and appropriate ROM Seated RTB hip abduction 3x10 cues for form and posture STS 2x5 lowest mat, cues for form and pacing, increased fwd trunk lean, appropriate BOS Swiss ball press down, standing, 2x12 cues for form and breath control HEP update/review - education w/ handout, provision of RTB                      PATIENT EDUCATION:  Education details: rationale for  interventions, HEP review Person educated: Patient Education method: Explanation, Demonstration, Tactile cues, Verbal cues, and Handouts Education comprehension: verbalized understanding, returned demonstration, verbal cues required, tactile cues required, and needs further education     HOME EXERCISE PROGRAM:  Access Code: [facility] URL: [email or web address] Date: 05/15/2022 Prepared by: Enis Slipper  Exercises - Seated Hip Adduction Isometrics with Diona Foley  - 1 x daily - 7 x weekly - 3 sets - 10 reps - Seated Thoracic Lumbar Extension  - 1 x daily - 7 x weekly - 2 sets - 10 reps - Seated Hip Abduction with Resistance  - 1 x daily - 7 x weekly - 2 sets - 10 reps - Supine March  - 1 x daily - 7 x weekly - 2 sets - 10 reps Added 05/18/22 Palloff press green band 10 x 2 each side daily     ASSESSMENT:   CLINICAL IMPRESSION: 05/18/2022 Pt arrives w/o pain, denies any pain since last session, reports good adherence w/ HEP. Continued working toward [place] for improved trunk extension and hip strength. She tolerated therex without adverse effects. She reported decreased right knee pain after session. Updated HEP as recommended in PT POC.    Eval - Patient is a 68 y.o. woman who was seen today for physical therapy evaluation and treatment for low back pain after a fall ~6months ago. Pt reports some improvement in symptoms with exercise but continues to have pain that is requiring her to modify daily activities and housework, increased pain with standing and extension based movements. On examination pt demonstrates limitations in lumbar extension and rotation, most pain with extension and report of mild relief w/ rotation. Pt also demonstrates general LE weakness but most notable in B hip rotators. Tolerates session well without increase in resting pain, reports good relief with HEP. No adverse events. Recommend skilled PT to address aforementioned deficits to maximize functional tolerance  and return to PLOF. Pt departs today's session in no acute distress, all voiced questions/concerns addressed appropriately from PT perspective.     OBJECTIVE IMPAIRMENTS: Abnormal gait, decreased activity tolerance, decreased mobility, difficulty walking, decreased ROM, decreased strength, hypomobility, increased muscle spasms, improper body mechanics, and pain.    ACTIVITY LIMITATIONS: carrying, lifting, bending, standing, squatting, stairs, transfers, and locomotion level   PARTICIPATION LIMITATIONS: meal prep, cleaning, laundry, and community activity   PERSONAL FACTORS: Age, Time since onset of injury/illness/exacerbation, and 1-2 comorbidities: HTN, DM2  are also affecting patient's functional outcome.    REHAB POTENTIAL: Good   CLINICAL DECISION MAKING: Stable/uncomplicated   EVALUATION COMPLEXITY: Low     GOALS: Goals reviewed with patient? No   SHORT TERM GOALS: Target date: 05/30/2022     Pt will demonstrate appropriate understanding and performance of initially prescribed HEP in order to facilitate improved independence with management of symptoms.  Baseline: HEP provided on eval Goal status: INITIAL    2. Pt will score greater than or equal to 79% on FOTO in order to demonstrate improved perception of function due to symptoms.            Baseline: 77%            Goal status: INITIAL     LONG TERM GOALS: Target date: 06/27/2022              Pt will score 81% on FOTO in order to demonstrate improved perception of functional status due to symptoms.  Baseline: 77% Goal status: INITIAL   2.  Pt will demonstrate full lumbar extension AROM with less than 3/10 pain on NPS in order to demonstrate improved tolerance to functional movement patterns.  Baseline: see ROM chart, 4/10 pain Goal status: INITIAL   3.  Pt will demonstrate hip rotational MMT of  at least 4/5 bilaterally in order to demonstrate improved strength for functional movements.  Baseline: see MMT chart  above Goal status: INITIAL   4. Pt will perform 5xSTS in <14 sec in order to demonstrate reduced fall risk and improved functional independence. (MCID of 2.3sec)            Baseline: 34sec standard chair weaning UE support after 2nd rep            Goal status: INITIAL    5. Pt will report ability to stand/walk for up to 45 minutes with less than 2 pt increase in resting pain in order to facilitate improved tolerance to household activities and ADLs.             Baseline: 4/10 pain at best, requires frequent sitting breaks            Goal status INITIAL   PLAN:   PT FREQUENCY: 2x/week   PT DURATION: 8 weeks   PLANNED INTERVENTIONS: Therapeutic exercises, Therapeutic activity, Neuromuscular re-education, Balance training, Gait training, Patient/Family education, Self Care, Joint mobilization, Stair training, DME instructions, Aquatic Therapy, Dry Needling, Electrical stimulation, Spinal mobilization, Cryotherapy, Moist heat, Taping, Manual therapy, and Re-evaluation.   PLAN FOR NEXT SESSION: FOTO status:  Progress ROM/strengthening exercises as able/appropriate, review HEP. Assess response to addition of paloff press to HEP and consider addition of lateral hip strength to HEP as pt reported positive outcome after this session with reduced knee pain.    Hessie Diener, PTA 05/18/22 1:04 PM Phone: [phone] Fax: [phone]

## 2022-05-23 ENCOUNTER — Ambulatory Visit: Payer: 59 | Attending: Internal Medicine | Admitting: Pharmacist

## 2022-05-23 DIAGNOSIS — Z23 Encounter for immunization: Secondary | ICD-10-CM | POA: Diagnosis not present

## 2022-05-23 NOTE — Progress Notes (Signed)
Patient presents for vaccination against streptococcus pneumoniae per orders of Dr. Wynetta Emery. Consent given. Counseling provided. No contraindications exists. Vaccine administered without incident.   Stephanie Neal, PharmD, [place], [place] [phone]

## 2022-05-23 NOTE — Therapy (Signed)
OUTPATIENT PHYSICAL THERAPY TREATMENT NOTE   Patient Name: Stephanie Neal MRN: [id number] DOB:1955/07/02, 67 y.o., female Today's Date: 05/24/2022  PCP: Ladell Pier, MD   REFERRING PROVIDER: Ladell Pier, MD  END OF SESSION:   PT End of Session - 05/24/22 1016     Visit Number 6    Number of Visits 17    Date for PT Re-Evaluation 06/27/22    Authorization Type [employer]    PT Start Time 1016    PT Stop Time 1055    PT Time Calculation (min) 39 min    Activity Tolerance Patient tolerated treatment well;No increased pain    Behavior During Therapy WFL for tasks assessed/performed                Past Medical History:  Diagnosis Date   Diabetes mellitus    Hyperlipidemia    Hypertension    Past Surgical History:  Procedure Laterality Date   FOOT SURGERY     MICROLARYNGOSCOPY N/A 01/18/2022   Procedure: MICROLARYNGOSCOPY WITH EXCISION OF POLYPS;  Surgeon: Izora Gala, MD;  Location: [facility] OR;  Service: ENT;  Laterality: N/A;   Patient Active Problem List   Diagnosis Date Noted   Aortic atherosclerosis ([place]) 05/05/2021   Tetanus, diphtheria, and acellular pertussis (Tdap) vaccination declined 05/10/2020   Hyperlipidemia associated with type 2 diabetes mellitus ([place]) 11/18/2019   Chronic pain of right knee 11/18/2019   Mixed hyperlipidemia 06/25/2018   Over weight 06/25/2018   Stress due to family tension 11/13/2017   DM type 2 (diabetes mellitus, type 2) ([place]) 03/05/2014   HYPERCHOLESTEROLEMIA 03/12/2007   OBESITY 03/12/2007   Tobacco dependence 03/12/2007   HTN (hypertension) 03/12/2007   ALLERGIC RHINITIS, SEASONAL 03/12/2007    REFERRING DIAG: M54.50 (ICD-10-CM) - Midline low back pain without sciatica, unspecified chronicity M53.3 (ICD-10-CM) - Coccyx pain  THERAPY DIAG:  Other low back pain  Abnormal posture  Muscle weakness (generalized)  Other abnormalities of gait and mobility  Rationale for Evaluation and Treatment  Rehabilitation  PERTINENT HISTORY: HTN, aortic atherosclerosis, DM2, tobacco use, throat surgery September 2023   PRECAUTIONS: none  PATIENT GOALS: reduce pain, get legs and back stronger    SUBJECTIVE:                                                                                                                                                                                      SUBJECTIVE STATEMENT:   05/24/2022 Pt arrives w/o pain, states she felt "great" after last session, HEP going well.    Eval - Pt was trying to sit down in her kitchen when she missed the chair and fell  on her bottom. Pt states since initial injury her symptoms have improved somewhat, has been doing exercises on her own which seem helpful. Pt notes most difficulty w/ stair navigation, cleaning around the house, dishes, etc. Has required increased sitting breaks with activity. No imaging performed yet although XR has been ordered. Pt describes extension based movements as being most painful although with repetition becomes less irritating. Denies LE referral, no N/T. Denies bowel/bladder issues, no saddle anesthesia. No fevers or night sweats.   PAIN:  Are you having pain 05/24/2022 : 0/10 Location: tailbone How would you describe your pain? sharp Best in past week: 0/10 (4/10 on eval) Worst in past week: 0/10  Aggravating factors: stairs, moving the wrong way, standing Easing factors: movement    OBJECTIVE: (objective measures completed at initial evaluation unless otherwise dated)   DIAGNOSTIC FINDINGS:  Sacrum/coccyx and lumbar XR ordered, pending 05/15/22: No fracture per XR   PATIENT SURVEYS:  FOTO 77 current, 81 predicted 05/24/22 80 FOTO    SCREENING FOR RED FLAGS: Unremarkable red flag screening    COGNITION: Overall cognitive status: Within functional limits for tasks assessed                          SENSATION: Light touch intact B LE     POSTURE: increased lordosis, elevated UT B    PALPATION: Significant TTP sacrum, moderate tenderness distal QL bilaterally. No other midline tenderness or bony deformity noted, mild tightness B hips but no pain endorsed   LUMBAR ROM:    AROM eval 05/15/22  Flexion 100% p! To ankles 100% no pain, touches top of feet  Extension 50% p! 50% mild pain  Right lateral flexion     Left lateral flexion     Right rotation 75% 100%  Left rotation 75% 100%   (Blank rows = not tested) Comments: extension more painful than flexion, B rotation pt reports relief    LOWER EXTREMITY ROM:      Active  Right eval Left eval  Hip flexion      Hip extension      Hip internal rotation      Hip external rotation      (Blank rows = not tested) (Key: WFL = within functional limits not formally assessed, * = concordant pain, s = stiffness/stretching sensation, NT = not tested)  Comments:     LOWER EXTREMITY MMT:     MMT Right eval Left eval  Hip flexion 4 4  Hip abduction (modified sitting) 5 5  Hip internal rotation 3+ knee pain 4  Hip external rotation 3+ 3+  Knee flexion 4 4  Knee extension 4 4    (Blank rows = not tested) (Key: WFL = within functional limits not formally assessed, * = concordant pain, s = stiffness/stretching sensation, NT = not tested)  Comments: hip rotation weak but painless B      FUNCTIONAL TESTS:  5xSTS: 34sec standard chair, gentle UE support initially but weaned after 2nd rep. Increased velocity with fatigue although pt reports improvement in symptoms  05/18/22: 24.3 sec , no UE, c/o right knee pain.    GAIT: Distance walked: within clinic Assistive device utilized: None Level of assistance: Complete Independence Comments: partial step through pattern B, reduced trunk ROM and arm swing, reduced gait speed and cadence   TODAY'S TREATMENT:  [facility] Adult PT Treatment:                                                 DATE: 05/24/22 Therapeutic Exercise: Paloff press GTB 2x12 cues for form and posture Standing thoracolumbar ext swiss ball at wall + OH reach, 2x10 BTB banded bridge 2x15 Standing hip abd GTB x12 each LE cues for form and posture HEP review  Therapeutic Activity: STS from standard chair 3x5 cues for BOS and mechanics Step up 6inch x8 each LE FOTO + education   [facility] Adult PT Treatment:                                                DATE: 05/18/22 Therapeutic Exercise: STS 5 x 2  Palloff press single green 10 x 2 Standing Row green x 15  Standing thoracolumbar extension against swiss ball at wall x12 cues for form , added OH reach BTB supine clam 10 x 2  BTB banded bridge 10 x 2  BTB supine marching 10 x 1 alternating  - cues for abdominal draw in  Side hip abduction x 10 each Side clam x 10 each  Instruction in palloff press for HEP   [facility] Adult PT Treatment:                                                DATE: 05/15/22 Therapeutic Exercise: Bridge + ball squeeze 3x10 cues for setup and sequencing seated marches 2x10 each LE cues for form and breath control, core contraction Seated GTB IR with ball between knees 2x8 cues for form  STS raised mat w 5# KB 2x8 cues for BOS and trunk mechanics RTB paloff press x8 each way cues for posture  Standing thoracolumbar extension against swiss ball at wall x12 cues for form  HEP update/review + handout                      PATIENT EDUCATION:  Education details: rationale for interventions, HEP review Person educated: Patient Education method: Explanation, Demonstration, Tactile cues, Verbal cues, and Handouts Education comprehension: verbalized understanding, returned demonstration, verbal cues required, tactile cues required, and needs further education     HOME EXERCISE PROGRAM: Access Code: [facility] URL: [email or web address] Date: 05/15/2022 Prepared by: Enis Slipper  Exercises - Seated Hip Adduction Isometrics  with Diona Foley  - 1 x daily - 7 x weekly - 3 sets - 10 reps - Seated Thoracic Lumbar Extension  - 1 x daily - 7 x weekly - 2 sets - 10 reps - Seated Hip Abduction with Resistance  - 1 x daily - 7 x weekly - 2 sets - 10 reps - Supine March  - 1 x daily - 7 x weekly - 2 sets - 10 reps Added 05/18/22 Palloff press green band 10 x 2 each side daily     ASSESSMENT:   CLINICAL IMPRESSION: 05/24/2022 Pt arrives w/o pain, states she continues to do quite well overall. Today progressed familiar program for volume and intensity as appropriate, pt tolerates quite well with intermittent rest breaks,  cues as above. FOTO improved to 80% which has nearly met predicted score. No adverse events, departs without pain. Pt departs today's session in no acute distress, all voiced questions/concerns addressed appropriately from PT perspective.     Eval - Patient is a 67 y.o. woman who was seen today for physical therapy evaluation and treatment for low back pain after a fall ~47months ago. Pt reports some improvement in symptoms with exercise but continues to have pain that is requiring her to modify daily activities and housework, increased pain with standing and extension based movements. On examination pt demonstrates limitations in lumbar extension and rotation, most pain with extension and report of mild relief w/ rotation. Pt also demonstrates general LE weakness but most notable in B hip rotators. Tolerates session well without increase in resting pain, reports good relief with HEP. No adverse events. Recommend skilled PT to address aforementioned deficits to maximize functional tolerance and return to PLOF. Pt departs today's session in no acute distress, all voiced questions/concerns addressed appropriately from PT perspective.     OBJECTIVE IMPAIRMENTS: Abnormal gait, decreased activity tolerance, decreased mobility, difficulty walking, decreased ROM, decreased strength, hypomobility, increased muscle spasms, improper  body mechanics, and pain.    ACTIVITY LIMITATIONS: carrying, lifting, bending, standing, squatting, stairs, transfers, and locomotion level   PARTICIPATION LIMITATIONS: meal prep, cleaning, laundry, and community activity   PERSONAL FACTORS: Age, Time since onset of injury/illness/exacerbation, and 1-2 comorbidities: HTN, DM2  are also affecting patient's functional outcome.    REHAB POTENTIAL: Good   CLINICAL DECISION MAKING: Stable/uncomplicated   EVALUATION COMPLEXITY: Low     GOALS: Goals reviewed with patient? No   SHORT TERM GOALS: Target date: 05/30/2022     Pt will demonstrate appropriate understanding and performance of initially prescribed HEP in order to facilitate improved independence with management of symptoms.  Baseline: HEP provided on eval Goal status: INITIAL    2. Pt will score greater than or equal to 79% on FOTO in order to demonstrate improved perception of function due to symptoms.            Baseline: 77%            Goal status: INITIAL     LONG TERM GOALS: Target date: 06/27/2022              Pt will score 81% on FOTO in order to demonstrate improved perception of functional status due to symptoms.  Baseline: 77% Goal status: INITIAL   2.  Pt will demonstrate full lumbar extension AROM with less than 3/10 pain on NPS in order to demonstrate improved tolerance to functional movement patterns.  Baseline: see ROM chart, 4/10 pain Goal status: INITIAL   3.  Pt will demonstrate hip rotational MMT of at least 4/5 bilaterally in order to demonstrate improved strength for functional movements.  Baseline: see MMT chart above Goal status: INITIAL   4. Pt will perform 5xSTS in <14 sec in order to demonstrate reduced fall risk and improved functional independence. (MCID of 2.3sec)            Baseline: 34sec standard chair weaning UE support after 2nd rep            Goal status: INITIAL    5. Pt will report ability to stand/walk for up to 45 minutes with less  than 2 pt increase in resting pain in order to facilitate improved tolerance to household activities and ADLs.  Baseline: 4/10 pain at best, requires frequent sitting breaks            Goal status INITIAL   PLAN:   PT FREQUENCY: 2x/week   PT DURATION: 8 weeks   PLANNED INTERVENTIONS: Therapeutic exercises, Therapeutic activity, Neuromuscular re-education, Balance training, Gait training, Patient/Family education, Self Care, Joint mobilization, Stair training, DME instructions, Aquatic Therapy, Dry Needling, Electrical stimulation, Spinal mobilization, Cryotherapy, Moist heat, Taping, Manual therapy, and Re-evaluation.   PLAN FOR NEXT SESSION: consider discharge w/ independent HEP if continues along current trajectory   Ashley Murrain PT, DPT 05/24/2022 10:56 AM

## 2022-05-24 ENCOUNTER — Encounter: Payer: Self-pay | Admitting: Physical Therapy

## 2022-05-24 ENCOUNTER — Ambulatory Visit: Payer: 59 | Admitting: Physical Therapy

## 2022-05-24 DIAGNOSIS — R293 Abnormal posture: Secondary | ICD-10-CM | POA: Diagnosis not present

## 2022-05-24 DIAGNOSIS — M6281 Muscle weakness (generalized): Secondary | ICD-10-CM | POA: Diagnosis not present

## 2022-05-24 DIAGNOSIS — M545 Low back pain, unspecified: Secondary | ICD-10-CM | POA: Diagnosis not present

## 2022-05-24 DIAGNOSIS — M5459 Other low back pain: Secondary | ICD-10-CM

## 2022-05-24 DIAGNOSIS — R2689 Other abnormalities of gait and mobility: Secondary | ICD-10-CM | POA: Diagnosis not present

## 2022-05-24 DIAGNOSIS — M533 Sacrococcygeal disorders, not elsewhere classified: Secondary | ICD-10-CM | POA: Diagnosis not present

## 2022-05-24 NOTE — Therapy (Signed)
OUTPATIENT PHYSICAL THERAPY TREATMENT NOTE + DISCHARGE SUMMARY   Patient Name: Stephanie Neal MRN: [id number] DOB:04-11-1956, 67 y.o., female Today's Date: 05/26/2022  PHYSICAL THERAPY DISCHARGE SUMMARY  Visits from Start of Care: 7  Current functional level related to goals / functional outcomes: Able to perform daily activities and functional mobility without back pain   Remaining deficits: Mild lumbar extension deficit, no pain   Education / Equipment: HEP, follow up with provider, discharge education   Patient agrees to discharge. Patient goals were mostly met. Patient is being discharged due to being pleased with the current functional level.    PCP: Marcine Matar, MD   REFERRING PROVIDER: Marcine Matar, MD  END OF SESSION:   PT End of Session - 05/26/22 1012     Visit Number 7    Number of Visits 17    Date for PT Re-Evaluation 06/27/22    Authorization Type [employer]    PT Start Time 1013    PT Stop Time 1052    PT Time Calculation (min) 39 min    Activity Tolerance Patient tolerated treatment well;No increased pain    Behavior During Therapy WFL for tasks assessed/performed                 Past Medical History:  Diagnosis Date   Diabetes mellitus    Hyperlipidemia    Hypertension    Past Surgical History:  Procedure Laterality Date   FOOT SURGERY     MICROLARYNGOSCOPY N/A 01/18/2022   Procedure: MICROLARYNGOSCOPY WITH EXCISION OF POLYPS;  Surgeon: Serena Colonel, MD;  Location: [facility] OR;  Service: ENT;  Laterality: N/A;   Patient Active Problem List   Diagnosis Date Noted   Aortic atherosclerosis (HCC) 05/05/2021   Tetanus, diphtheria, and acellular pertussis (Tdap) vaccination declined 05/10/2020   Hyperlipidemia associated with type 2 diabetes mellitus (HCC) 11/18/2019   Chronic pain of right knee 11/18/2019   Mixed hyperlipidemia 06/25/2018   Over weight 06/25/2018   Stress due to family tension 11/13/2017   DM type 2 (diabetes  mellitus, type 2) (HCC) 03/05/2014   HYPERCHOLESTEROLEMIA 03/12/2007   OBESITY 03/12/2007   Tobacco dependence 03/12/2007   HTN (hypertension) 03/12/2007   ALLERGIC RHINITIS, SEASONAL 03/12/2007    REFERRING DIAG: M54.50 (ICD-10-CM) - Midline low back pain without sciatica, unspecified chronicity M53.3 (ICD-10-CM) - Coccyx pain  THERAPY DIAG:  Other low back pain  Abnormal posture  Muscle weakness (generalized)  Other abnormalities of gait and mobility  Rationale for Evaluation and Treatment Rehabilitation  PERTINENT HISTORY: HTN, aortic atherosclerosis, DM2, tobacco use, throat surgery September 2023   PRECAUTIONS: none  PATIENT GOALS: reduce pain, get legs and back stronger    SUBJECTIVE:  SUBJECTIVE STATEMENT:   05/26/2022 Pt arrives without pain, states she has not had any back pain recently. States exercises have been going well. States she feels much better and would like to discharge today.   Eval - Pt was trying to sit down in her kitchen when she missed the chair and fell on her bottom. Pt states since initial injury her symptoms have improved somewhat, has been doing exercises on her own which seem helpful. Pt notes most difficulty w/ stair navigation, cleaning around the house, dishes, etc. Has required increased sitting breaks with activity. No imaging performed yet although XR has been ordered. Pt describes extension based movements as being most painful although with repetition becomes less irritating. Denies LE referral, no N/T. Denies bowel/bladder issues, no saddle anesthesia. No fevers or night sweats.   PAIN:  Are you having pain 05/26/2022 : 0/10 Location: tailbone How would you describe your pain? sharp Best in past week: 0/10 (4/10 on eval) Worst in past week: 0/10  Aggravating  factors: stairs, moving the wrong way, standing Easing factors: movement    OBJECTIVE: (objective measures completed at initial evaluation unless otherwise dated)   DIAGNOSTIC FINDINGS:  Sacrum/coccyx and lumbar XR ordered, pending 05/15/22: No fracture per XR   PATIENT SURVEYS:  FOTO 77 current, 81 predicted 05/24/22 80 FOTO    SCREENING FOR RED FLAGS: Unremarkable red flag screening    COGNITION: Overall cognitive status: Within functional limits for tasks assessed                          SENSATION: Light touch intact B LE     POSTURE: increased lordosis, elevated UT B   PALPATION: Significant TTP sacrum, moderate tenderness distal QL bilaterally. No other midline tenderness or bony deformity noted, mild tightness B hips but no pain endorsed   LUMBAR ROM:    AROM eval 05/15/22 05/26/22  Flexion 100% p! To ankles 100% no pain, touches top of feet 100% to feet  Extension 50% p! 50% mild pain 75% painless  Right lateral flexion      Left lateral flexion      Right rotation 75% 100% 100%  Left rotation 75% 100% 100%   (Blank rows = not tested) Comments: extension more painful than flexion, B rotation pt reports relief    LOWER EXTREMITY ROM:      Active  Right eval Left eval  Hip flexion      Hip extension      Hip internal rotation      Hip external rotation      (Blank rows = not tested) (Key: WFL = within functional limits not formally assessed, * = concordant pain, s = stiffness/stretching sensation, NT = not tested)  Comments:     LOWER EXTREMITY MMT:     MMT Right eval Left eval R/L 05/26/22  Hip flexion 4 4 5/5  Hip abduction (modified sitting) 5 5 5/5  Hip internal rotation 3+ knee pain 4 4+ painless/5  Hip external rotation 3+ 3+ 5/5  Knee flexion 4 4 4+/4+  Knee extension 4 4 4+/4+    (Blank rows = not tested) (Key: WFL = within functional limits not formally assessed, * = concordant pain, s = stiffness/stretching sensation, NT = not tested)   Comments: hip rotation weak but painless B      FUNCTIONAL TESTS:  5xSTS: 34sec standard chair, gentle UE support initially but weaned after 2nd rep. Increased velocity with fatigue  although pt reports improvement in symptoms  05/18/22: 24.3 sec , no UE, c/o right knee pain.  05/26/22: 13 sec no UE support, standard chair   GAIT: Distance walked: within clinic Assistive device utilized: None Level of assistance: Complete Independence Comments: partial step through pattern B, reduced trunk ROM and arm swing, reduced gait speed and cadence   TODAY'S TREATMENT:                                                                                                 [facility] Adult PT Treatment:                                                DATE: 05/26/22 Therapeutic Exercise: Seated adduction iso 2x10 cues for HEP Seated thoracolumbar extension 2x10  Seated ER/IR with ball and band x10 each, cues for form and setup Seated GTB hip abduction x10 cues for HEP GTB paloff press x10 each way Seated march 2x10 each LE HEP review + handout  Therapeutic Activity: 5xSTS + education MSK assessment + education Education on pacing of activities, monitoring symptoms and following up with provider as needed, safe exercise                PATIENT EDUCATION:  Education details: rationale for interventions, HEP review, discharge education Person educated: Patient Education method: Explanation, Demonstration, Tactile cues, Verbal cues, and Handouts Education comprehension: verbalized understanding, returned demonstration, verbal cues required, tactile cues required, and needs further education     HOME EXERCISE PROGRAM: Access Code: [facility] URL: [email or web address] Date: 05/26/2022 Prepared by: Enis Slipper  Exercises - Seated Hip Adduction Isometrics with Diona Foley  - 1 x daily - 7 x weekly - 3 sets - 10 reps - Seated Thoracic Lumbar Extension  - 1 x daily - 7 x weekly - 2 sets - 10 reps -  Seated Hip Abduction with Resistance  - 1 x daily - 7 x weekly - 2 sets - 10 reps - Seated March  - 1 x daily - 7 x weekly - 2 sets - 10 reps - Isometric Anti-Rotation Press  - 1 x daily - 7 x weekly - 2 sets - 10 reps    ASSESSMENT:   CLINICAL IMPRESSION: 05/26/2022 Pt arrives w/o pain, no issues since last session, states back is doing much better and she feels ready to discharge, denies limitations due to back. MSK impairments much improved compared to initial evaluation with increase in hip strength and lumbar mobility - mild reduction in lumbar extension remains but is painless. 5xSTS improved from 34sec on initial evaluation to 13sec today which meets LTG and indicates significantly reduced fall risk. HEP reviewed, pt requires min cues for home setup but overall performs well, no pain or adverse events. Discharge education provided, pt agreeable to discharge on this date, denies any questions/concerns on departure.     Eval - Patient is a 67 y.o. woman who was seen today for physical therapy evaluation and treatment  for low back pain after a fall ~70months ago. Pt reports some improvement in symptoms with exercise but continues to have pain that is requiring her to modify daily activities and housework, increased pain with standing and extension based movements. On examination pt demonstrates limitations in lumbar extension and rotation, most pain with extension and report of mild relief w/ rotation. Pt also demonstrates general LE weakness but most notable in B hip rotators. Tolerates session well without increase in resting pain, reports good relief with HEP. No adverse events. Recommend skilled PT to address aforementioned deficits to maximize functional tolerance and return to PLOF. Pt departs today's session in no acute distress, all voiced questions/concerns addressed appropriately from PT perspective.     OBJECTIVE IMPAIRMENTS:  Decreased mobility, decreased ROM, decreased strength,  hypomobility   ACTIVITY LIMITATIONS: denies limitations due to back   PARTICIPATION LIMITATIONS: denies limitations due to back   PERSONAL FACTORS: Age, Time since onset of injury/illness/exacerbation, and 1-2 comorbidities: HTN, DM2  are also affecting patient's functional outcome.    REHAB POTENTIAL: Good   CLINICAL DECISION MAKING: Stable/uncomplicated   EVALUATION COMPLEXITY: Low     GOALS: Goals reviewed with patient? No   SHORT TERM GOALS: Target date: 05/30/2022     Pt will demonstrate appropriate understanding and performance of initially prescribed HEP in order to facilitate improved independence with management of symptoms.  Baseline: HEP provided on eval Goal status: MET   2. Pt will score greater than or equal to 79% on FOTO in order to demonstrate improved perception of function due to symptoms.            Baseline: 77%            Goal status: MET   LONG TERM GOALS: Target date: 06/27/2022              Pt will score 81% on FOTO in order to demonstrate improved perception of functional status due to symptoms.  Baseline: 77% 05/24/22: 80%  Goal status: PARTIALLY MET   2.  Pt will demonstrate full lumbar extension AROM with less than 3/10 pain on NPS in order to demonstrate improved tolerance to functional movement patterns.  Baseline: see ROM chart, 4/10 pain 05/26/22: 75% painless Goal status: PARTIALLY MET   3.  Pt will demonstrate hip rotational MMT of at least 4/5 bilaterally in order to demonstrate improved strength for functional movements.  Baseline: see MMT chart above 05/26/22: 4+ to 5 B hip ER/IR Goal status: MET   4. Pt will perform 5xSTS in <14 sec in order to demonstrate reduced fall risk and improved functional independence. (MCID of 2.3sec)            Baseline: 34sec standard chair weaning UE support after 2nd rep  05/26/22: 13sec no UE support            Goal status: MET    5. Pt will report ability to stand/walk for up to 45 minutes with less  than 2 pt increase in resting pain in order to facilitate improved tolerance to household activities and ADLs.             Baseline: 4/10 pain at best, requires frequent sitting breaks  05/26/22: No back pain in past week with functional mobility            Goal status MET   PLAN: discharge   PT FREQUENCY: n/a   PT DURATION: n/a   PLANNED INTERVENTIONS: Therapeutic exercises, Therapeutic activity, Neuromuscular re-education, Balance  training, Gait training, Patient/Family education, Self Care, Joint mobilization, Stair training, DME instructions, Aquatic Therapy, Dry Needling, Electrical stimulation, Spinal mobilization, Cryotherapy, Moist heat, Taping, Manual therapy, and Re-evaluation.   PLAN FOR NEXT SESSION: n/a; discharge   Leeroy Cha PT, DPT 05/26/2022 12:03 PM

## 2022-05-26 ENCOUNTER — Ambulatory Visit: Payer: Medicare Other | Admitting: Internal Medicine

## 2022-05-26 ENCOUNTER — Ambulatory Visit: Payer: 59 | Attending: Internal Medicine

## 2022-05-26 ENCOUNTER — Encounter: Payer: Self-pay | Admitting: Physical Therapy

## 2022-05-26 ENCOUNTER — Ambulatory Visit: Payer: 59 | Admitting: Physical Therapy

## 2022-05-26 DIAGNOSIS — M6281 Muscle weakness (generalized): Secondary | ICD-10-CM

## 2022-05-26 DIAGNOSIS — R2689 Other abnormalities of gait and mobility: Secondary | ICD-10-CM | POA: Diagnosis not present

## 2022-05-26 DIAGNOSIS — R293 Abnormal posture: Secondary | ICD-10-CM | POA: Diagnosis not present

## 2022-05-26 DIAGNOSIS — Z0000 Encounter for general adult medical examination without abnormal findings: Secondary | ICD-10-CM

## 2022-05-26 DIAGNOSIS — M5459 Other low back pain: Secondary | ICD-10-CM | POA: Diagnosis not present

## 2022-05-26 DIAGNOSIS — M545 Low back pain, unspecified: Secondary | ICD-10-CM | POA: Diagnosis not present

## 2022-05-26 DIAGNOSIS — M533 Sacrococcygeal disorders, not elsewhere classified: Secondary | ICD-10-CM | POA: Diagnosis not present

## 2022-05-26 NOTE — Progress Notes (Signed)
Subjective:   Stephanie Neal is a 67 y.o. female who presents for Medicare Annual (Subsequent) preventive examination.  Review of Systems    connected with  Stephanie Neal on  05/26/21 at 2 pm  by telephone and verified that I am speaking with the correct person using two identifiers. I discussed the limitations, risks, security and privacy concerns of performing an evaluation and management service by telephone and the availability of in person appointments. I also discussed with the patient that there may be a patient responsible charge related to this service. The patient expressed understanding and agreed to proceed.  Patient location:  home  My Location: [facility]  Persons on the telephone call:   myself(Rudra Hobbins )and Ms.Tsang        Objective:    There were no vitals filed for this visit. There is no height or weight on file to calculate BMI.     05/26/2022    2:20 PM 05/26/2022    2:19 PM 05/02/2022   10:18 AM 01/18/2022   12:49 PM 06/29/2020   10:10 AM 11/24/2016    2:58 PM 07/26/2016   10:48 AM  Advanced Directives  Does Patient Have a Medical Advance Directive? No Yes No No No No No  Would patient like information on creating a medical advance directive?   No - Patient declined No - Patient declined No - Patient declined No - Patient declined No - Patient declined    Current Medications (verified) Outpatient Encounter Medications as of 05/26/2022  Medication Sig   amLODipine (NORVASC) 10 MG tablet Take 1 tablet (10 mg total) by mouth daily.   atorvastatin (LIPITOR) 40 MG tablet Take 1 tablet (40 mg total) by mouth daily.   Blood Glucose Monitoring Suppl (ACCU-CHEK GUIDE ME) w/Device KIT Use as directed   glimepiride (AMARYL) 2 MG tablet Take 2 tabs (4 mg) in the morning and 1 tablet (2 mg) in the afternoon.   glucose blood (ACCU-CHEK GUIDE) test strip Use as instructed   Lancets Misc. (ACCU-CHEK FASTCLIX LANCET) KIT Use as directed    lidocaine (LIDODERM) 5 % Place 1 patch onto the skin daily. Remove & Discard patch within 12 hours or as directed by MD   lisinopril (ZESTRIL) 30 MG tablet Take 1 tablet (30 mg total) by mouth daily.   metFORMIN (GLUCOPHAGE) 1000 MG tablet TAKE 1 TABLET BY MOUTH 2 (TWO) TIMES DAILY WITH A MEAL. FOR DIABETES   albuterol (VENTOLIN HFA) 108 (90 Base) MCG/ACT inhaler Inhale 1-2 puffs into the lungs every 6 (six) hours as needed for wheezing or shortness of breath. (Patient not taking: Reported on 04/13/2022)   No facility-administered encounter medications on file as of 05/26/2022.    Allergies (verified) Penicillins   History: Past Medical History:  Diagnosis Date   Diabetes mellitus    Hyperlipidemia    Hypertension    Past Surgical History:  Procedure Laterality Date   FOOT SURGERY     MICROLARYNGOSCOPY N/A 01/18/2022   Procedure: MICROLARYNGOSCOPY WITH EXCISION OF POLYPS;  Surgeon: Serena Colonel, MD;  [facility];  Service: ENT;  Laterality: N/A;   Family History  Problem Relation Age of Onset   Hypertension Mother    Diabetes Mother    COPD Father    Heart disease Father    Diabetes Father    Social History   Socioeconomic History   Marital status: Single    Spouse name: Not on file   Number of children: Not on  file   Years of education: Not on file   Highest education level: Not on file  Occupational History   Not on file  Tobacco Use   Smoking status: Former   Smokeless tobacco: Never  Vaping Use   Vaping Use: Never used  Substance and Sexual Activity   Alcohol use: No   Drug use: No   Sexual activity: Not on file  Other Topics Concern   Not on file  Social History Narrative   Not on file   Social Determinants of Health   Financial Resource Strain: Low Risk  (05/26/2022)   Overall Financial Resource Strain (CARDIA)    Difficulty of Paying Living Expenses: Not hard at all  Food Insecurity: No Food Insecurity (05/26/2022)   Hunger Vital Sign    Worried  About Running Out of Food in the Last Year: Never true    Ran Out of Food in the Last Year: Never true  Transportation Needs: No Transportation Needs (05/26/2022)   PRAPARE - Administrator, Civil Service (Medical): No    Lack of Transportation (Non-Medical): No  Physical Activity: Insufficiently Active (05/26/2022)   Exercise Vital Sign    Days of Exercise per Week: 2 days    Minutes of Exercise per Session: 40 min  Stress: No Stress Concern Present (05/26/2022)   [employer] of Occupational Health - Occupational Stress Questionnaire    Feeling of Stress : Not at all  Social Connections: Moderately Integrated (05/26/2022)   Social Connection and Isolation Panel [NHANES]    Frequency of Communication with Friends and Family: Twice a week    Frequency of Social Gatherings with Friends and Family: Once a week    Attends Religious Services: 1 to 4 times per year    Active Member of [employer] or Organizations: Yes    Attends Banker Meetings: 1 to 4 times per year    Marital Status: Widowed    Tobacco Counseling Counseling given: Not Answered   Clinical Intake:     Pain : No/denies pain     Diabetes: Yes CBG done?: No     Diabetic?yes          Activities of Daily Living    05/26/2022    2:20 PM 01/18/2022   12:51 PM  In your present state of health, do you have any difficulty performing the following activities:  Hearing? 0   Vision? 0   Difficulty concentrating or making decisions? 0   Walking or climbing stairs? 1   Dressing or bathing? 0   Doing errands, shopping? 0 0  Preparing Food and eating ? N   Using the Toilet? N   In the past six months, have you accidently leaked urine? N   Do you have problems with loss of bowel control? N   Managing your Medications? N   Managing your Finances? N   Housekeeping or managing your Housekeeping? N     Patient Care Team: Marcine Matar, MD as PCP - General (Internal  Medicine)  Indicate any recent Medical Services you may have received from other than [facility] providers in the past year (date may be approximate).     Assessment:   This is a routine wellness examination for Sol.  Hearing/Vision screen No results found.  Dietary issues and exercise activities discussed:     Goals Addressed   None   Depression Screen    04/13/2022   10:39 AM 12/21/2021   10:41 AM 05/05/2021  10:57 AM 12/31/2020   11:00 AM 06/29/2020   10:08 AM 05/10/2020    9:43 AM 11/18/2019   11:06 AM  PHQ 2/9 Scores  PHQ - 2 Score 0 0 0 0 0 0 0  PHQ- 9 Score 0          Fall Risk    05/26/2022    2:20 PM 04/13/2022   10:32 AM 12/21/2021   10:41 AM 05/05/2021   10:57 AM 12/31/2020   11:00 AM  Fall Risk   Falls in the past year? 1 1 0 0 0  Number falls in past yr: 0 0 0 0 0  Injury with Fall? 0 0 0 0 0  Risk for fall due to : Other (Comment) No Fall Risks No Fall Risks No Fall Risks No Fall Risks    FALL RISK PREVENTION PERTAINING TO THE HOME:  Any stairs in or around the home? Yes  If so, are there any without handrails? Yes  Home free of loose throw rugs in walkways, pet beds, electrical cords, etc? No  Adequate lighting in your home to reduce risk of falls? Yes   ASSISTIVE DEVICES UTILIZED TO PREVENT FALLS:  Life alert? No  Use of a cane, walker or w/c? No  Grab bars in the bathroom? No  Shower chair or bench in shower? No  Elevated toilet seat or a handicapped toilet? No   TIMED UP AND GO:  Was the test performed? Yes .  Length of time to ambulate 10 feet: 5 sec.   Gait slow and steady without use of assistive device  Cognitive Function:    05/26/2022    2:22 PM 06/29/2020   10:10 AM  MMSE - Mini Mental State Exam  Orientation to time 5 5  Orientation to Place 5 5  Registration 3 3  Attention/ Calculation 5 5  Recall 1 0  Language- name 2 objects 2 2  Language- repeat 1 1  Language- follow 3 step command 3 3  Language- read & follow direction 1 1   Write a sentence 1 1  Copy design  1  Total score  27        05/26/2022    2:24 PM  6CIT Screen  What Year? 0 points  What month? 0 points  What time? 0 points  Count back from 20 0 points  Months in reverse 0 points  Repeat phrase 0 points  Total Score 0 points    Immunizations Immunization History  Administered Date(s) Administered   Fluad Quad(high Dose 65+) 04/13/2022   Influenza,inj,Quad PF,6+ Mos 03/05/2014, 05/04/2016, 01/29/2017, 03/18/2018, 01/07/2019, 02/05/2020, 01/25/2021   PFIZER(Purple Top)SARS-COV-2 Vaccination 10/20/2019, 11/17/2019, 06/22/2020   PNEUMOCOCCAL CONJUGATE-20 05/23/2022   Pneumococcal Conjugate-13 06/25/2014   Pneumococcal Polysaccharide-23 07/26/2016   Tdap 05/01/2010, 06/29/2020    TDAP status: Up to date  Flu Vaccine status: Up to date  Pneumococcal vaccine status: Up to date  Covid-19 vaccine status: Information provided on how to obtain vaccines.   Qualifies for Shingles Vaccine? Yes   Zostavax completed Yes   Shingrix Completed?: No.    Education has been provided regarding the importance of this vaccine. Patient has been advised to call insurance company to determine out of pocket expense if they have not yet received this vaccine. Advised may also receive vaccine at local pharmacy or Health Dept. Verbalized acceptance and understanding. Patient decline as of now   Screening Tests Health Maintenance  Topic Date Due   Zoster Vaccines- Shingrix (1  of 2) Never done   OPHTHALMOLOGY EXAM  07/14/2015   DEXA SCAN  Never done   FOOT EXAM  06/29/2021   COVID-19 Vaccine (4 - 2023-24 season) 12/30/2021   Diabetic kidney evaluation - Urine ACR  12/31/2021   COLONOSCOPY (Pts 45-31yrs Insurance coverage will need to be confirmed)  02/12/2022   HEMOGLOBIN A1C  10/13/2022   Diabetic kidney evaluation - eGFR measurement  12/22/2022   Medicare Annual Wellness (AWV)  05/27/2023   MAMMOGRAM  08/17/2023   DTaP/Tdap/Td (3 - Td or Tdap)  06/30/2030   Pneumonia Vaccine 62+ Years old  Completed   INFLUENZA VACCINE  Completed   Hepatitis C Screening  Completed   HPV VACCINES  Aged Out    Health Maintenance  Health Maintenance Due  Topic Date Due   Zoster Vaccines- Shingrix (1 of 2) Never done   OPHTHALMOLOGY EXAM  07/14/2015   DEXA SCAN  Never done   FOOT EXAM  06/29/2021   COVID-19 Vaccine (4 - 2023-24 season) 12/30/2021   Diabetic kidney evaluation - Urine ACR  12/31/2021   COLONOSCOPY (Pts 45-27yrs Insurance coverage will need to be confirmed)  02/12/2022    Colorectal cancer screening: Type of screening: Colonoscopy. Completed 51015/2020. Repeat every 5 years  Patient stated that colonoscopy place call and stated she is due every 5 yes   Mammogram status: Completed 08/16/21. Repeat every year  Bone Density status: Ordered 05/26/21. Pt provided with contact info and advised to call to schedule appt.  Lung Cancer Screening: (Low Dose CT Chest recommended if Age 9-80 years, 30 pack-year currently smoking OR have quit w/in 15years.) does not qualify.   Lung Cancer Screening Referral:   Additional Screening:  Hepatitis C Screening: does qualify; Completed 04/27/14  Vision Screening: Recommended annual ophthalmology exams for early detection of glaucoma and other disorders of the eye. Is the patient up to date with their annual eye exam?  Yes  Who is the provider or what is the name of the office in which the patient attends annual eye exams? Ordered  If pt is not established with a provider, would they like to be referred to a provider to establish care?    .   Dental Screening: Recommended annual dental exams for proper oral hygiene  Community Resource Referral / Chronic Care Management: CRR required this visit?  No   CCM required this visit?  No      Plan:     I have personally reviewed and noted the following in the patient's chart:   Medical and social history Use of alcohol, tobacco or illicit  drugs  Current medications and supplements including opioid prescriptions. Patient is not currently taking opioid prescriptions. Functional ability and status Nutritional status Physical activity Advanced directives List of other physicians Hospitalizations, surgeries, and ER visits in previous 12 months Vitals Screenings to include cognitive, depression, and falls Referrals and appointments  In addition, I have reviewed and discussed with patient certain preventive protocols, quality metrics, and best practice recommendations. A written personalized care plan for preventive services as well as general preventive health recommendations were provided to patient.     Lillie Columbia, CMA   05/26/2022   Nurse Notes:

## 2022-05-29 ENCOUNTER — Other Ambulatory Visit: Payer: Self-pay | Admitting: Internal Medicine

## 2022-05-29 DIAGNOSIS — M545 Low back pain, unspecified: Secondary | ICD-10-CM

## 2022-05-29 DIAGNOSIS — M533 Sacrococcygeal disorders, not elsewhere classified: Secondary | ICD-10-CM

## 2022-05-30 ENCOUNTER — Ambulatory Visit: Payer: 59 | Admitting: Physical Therapy

## 2022-05-30 NOTE — Telephone Encounter (Signed)
Requested Prescriptions  Pending Prescriptions Disp Refills   lidocaine (LIDODERM) 5 % [Pharmacy Med Name: LIDOCAINE 5% PATCH] 30 patch 0    Sig: PLACE 1 PATCH ONTO THE SKIN DAILY. REMOVE & DISCARD PATCH WITHIN 12 HOURS OR AS DIRECTED BY MD     Analgesics:  Topicals Failed - 05/29/2022 10:34 AM      Failed - Manual Review: Labs are only required if the patient has taken medication for more than 8 weeks.      Passed - PLT in normal range and within 360 days    Platelets  Date Value Ref Range Status  12/21/2021 218 150 - 450 x10E3/uL Final         Passed - HGB in normal range and within 360 days    Hemoglobin  Date Value Ref Range Status  12/21/2021 13.4 11.1 - 15.9 g/dL Final         Passed - HCT in normal range and within 360 days    Hematocrit  Date Value Ref Range Status  12/21/2021 39.6 34.0 - 46.6 % Final         Passed - Cr in normal range and within 360 days    Creat  Date Value Ref Range Status  04/04/2016 1.11 (H) 0.50 - 0.99 mg/dL Final    Comment:      For patients > or = 68 years of age: The upper reference limit for Creatinine is approximately 13% higher for people identified as African-American.      Creatinine, Ser  Date Value Ref Range Status  12/21/2021 0.74 0.57 - 1.00 mg/dL Final   Creatinine, Urine  Date Value Ref Range Status  04/04/2016 43 20 - 320 mg/dL Final         Passed - eGFR is 30 or above and within 360 days    GFR, Est African American  Date Value Ref Range Status  04/04/2016 62 >=60 mL/min Final   GFR calc Af Amer  Date Value Ref Range Status  05/19/2019 91 >59 mL/min/1.73 Final   GFR, Est Non African American  Date Value Ref Range Status  04/04/2016 54 (L) >=60 mL/min Final   GFR, Estimated  Date Value Ref Range Status  11/18/2021 >60 >60 mL/min Final    Comment:    (NOTE) Calculated using the CKD-EPI Creatinine Equation (2021)    eGFR  Date Value Ref Range Status  12/21/2021 89 >59 mL/min/1.73 Final          Passed - Patient is not pregnant      Passed - Valid encounter within last 12 months    Recent Outpatient Visits           1 week ago Need for vaccination for Strep pneumoniae   [place], [place] L, RPH-CPP   1 month ago Type 2 diabetes mellitus with hyperlipidemia [facility])   [place] Karle Plumber B, MD   5 months ago Type 2 diabetes mellitus with hyperlipidemia [facility])   [place], [place], [place]   1 year ago Type 2 diabetes mellitus with hyperlipidemia [facility])   [place] Karle Plumber B, MD   1 year ago Type 2 diabetes mellitus with hyperlipidemia [facility])   [place] Ladell Pier, MD

## 2022-06-01 ENCOUNTER — Ambulatory Visit: Payer: 59 | Admitting: Physical Therapy

## 2022-07-03 ENCOUNTER — Encounter: Payer: Self-pay | Admitting: Pharmacist

## 2022-07-03 ENCOUNTER — Ambulatory Visit: Payer: 59 | Attending: Internal Medicine | Admitting: Pharmacist

## 2022-07-03 VITALS — BP 126/69 | HR 86

## 2022-07-03 DIAGNOSIS — I10 Essential (primary) hypertension: Secondary | ICD-10-CM | POA: Diagnosis not present

## 2022-07-03 NOTE — Progress Notes (Signed)
S:    Chief Complaint  Patient presents with   Medication Management    hypertension   67 y.o. female who presents for hypertension evaluation, education, and management. PMH is significant for HTN, T2DM, aortic atherosclerosis, HLD, and former smoker (quit 4 weeks ago). Patient was referred and last seen by Primary Care Provider, Dr. Wynetta Emery, on 12/14/20243 where BP was slightly elevated at 134/70 mmHg.   Today, patient arrives in good spirits and presents without assistance. Denies dizziness, headache, blurred vision, swelling. States she has a wrist BP monitor at home, but does not check regularly. Denies personal or family history CKD, ASCVD, or heart failure. Her son died of an aneurysm in his 28s. Former smoker, quit 4 months ago.   Patient reports hypertension was diagnosed a 'long time ago'.   Family/Social history:  CKD, MI/Stroke, no HF, lost son to brain aneurysm  Quit smoking 4 months ago   Medication adherence reported. Patient has taken BP medications today.   Current antihypertensives include: amlodipine 10 mg daily, lisinopril 30 mg daily   Antihypertensives tried in the past include: amlodipine 10 mg daily, lisinopril 30 mg daily  Reported home BP readings: not checking regularly   Patient reported dietary habits: tries to limit salt intake  Patient-reported exercise habits: walk arounds neighborhood  O:  Vitals:   07/03/22 0903 07/03/22 0904  BP: 137/74 126/69  Pulse: 86     Last 3 Office BP readings: BP Readings from Last 3 Encounters:  07/03/22 126/69  04/13/22 134/70  01/18/22 127/76    BMET    Component Value Date/Time   NA 141 12/21/2021 1105   K 4.1 12/21/2021 1105   CL 103 12/21/2021 1105   CO2 25 12/21/2021 1105   GLUCOSE 131 (H) 12/21/2021 1105   GLUCOSE 125 (H) 11/18/2021 1013   BUN 9 12/21/2021 1105   CREATININE 0.74 12/21/2021 1105   CREATININE 1.11 (H) 04/04/2016 1113   CALCIUM 9.3 12/21/2021 1105   GFRNONAA >60 11/18/2021 1013    GFRNONAA 54 (L) 04/04/2016 1113   GFRAA 91 05/19/2019 1023   GFRAA 62 04/04/2016 1113    Renal function: CrCl cannot be calculated (Patient's most recent lab result is older than the maximum 21 days allowed.).  Clinical ASCVD: No  The 10-year ASCVD risk score (Arnett DK, et al., 2019) is: 34.8%   Values used to calculate the score:     Age: 58 years     Sex: Female     Is Non-Hispanic African American: Yes     Diabetic: Yes     Tobacco smoker: Yes     Systolic Blood Pressure: 123XX123 mmHg     Is BP treated: Yes     HDL Cholesterol: 49 mg/dL     Total Cholesterol: 159 mg/dL   A/P: Hypertension diagnosed currently at goal on current medications. BP goal < 130/80 mmHg. Medication adherence appears reported. BP in clinic 137/74 mmHg, on repeat 126/69 mmHg.  -Continued amlodipine 10 mg daily and lisinopril 30 mg daily.  -Patient educated on purpose, proper use, and potential adverse effects of amlodipine and lisinopril.  -Counseled on lifestyle modifications for blood pressure control including reduced dietary sodium, increased exercise, adequate sleep. -Encouraged patient to check BP at home and bring log of readings to next visit. Counseled on proper use of home BP cuff.   Results reviewed and written information provided.    Written patient instructions provided. Patient verbalized understanding of treatment plan.  Total time in face  to face counseling 20 minutes.    Follow-up:  Pharmacist PRN. PCP clinic visit in May 2024.   Joseph Art, Pharm.D. PGY-2 Ambulatory Care Pharmacy Resident

## 2022-07-13 ENCOUNTER — Telehealth: Payer: Self-pay | Admitting: Internal Medicine

## 2022-07-13 NOTE — Telephone Encounter (Signed)
Caryl Pina calling from [facility] is calling to report that she reached out to the patient at least 3 times. Unable to leave a VM for the pt as her VM is full. [facility] [place], [place], [place] [address]  [phone]

## 2022-07-13 NOTE — Telephone Encounter (Signed)
Patient was called to give information to schedule eye appointment, patient states that she [facility] with [facility] and will reach out to them in the morning to schedule appointment.

## 2022-07-18 ENCOUNTER — Other Ambulatory Visit: Payer: Self-pay | Admitting: Internal Medicine

## 2022-07-18 DIAGNOSIS — Z1231 Encounter for screening mammogram for malignant neoplasm of breast: Secondary | ICD-10-CM

## 2022-08-28 ENCOUNTER — Other Ambulatory Visit: Payer: Self-pay | Admitting: Internal Medicine

## 2022-08-28 DIAGNOSIS — I10 Essential (primary) hypertension: Secondary | ICD-10-CM

## 2022-09-05 ENCOUNTER — Ambulatory Visit
Admission: RE | Admit: 2022-09-05 | Discharge: 2022-09-05 | Disposition: A | Discharge status: Home / Self Care | Payer: 59 | Source: Ambulatory Visit | Attending: Internal Medicine | Admitting: Internal Medicine

## 2022-09-05 DIAGNOSIS — Z1231 Encounter for screening mammogram for malignant neoplasm of breast: Secondary | ICD-10-CM

## 2022-09-11 ENCOUNTER — Other Ambulatory Visit: Payer: Self-pay | Admitting: Internal Medicine

## 2022-09-11 DIAGNOSIS — I10 Essential (primary) hypertension: Secondary | ICD-10-CM

## 2022-09-11 NOTE — Telephone Encounter (Signed)
Requested Prescriptions  Pending Prescriptions Disp Refills   amLODipine (NORVASC) 10 MG tablet [Pharmacy Med Name: AMLODIPINE BESYLATE 10 MG TAB] 90 tablet 1    Sig: TAKE 1 TABLET BY MOUTH EVERY DAY     Cardiovascular: Calcium Channel Blockers 2 Passed - 09/11/2022 12:13 AM      Passed - Last BP in normal range    BP Readings from Last 1 Encounters:  07/03/22 126/69         Passed - Last Heart Rate in normal range    Pulse Readings from Last 1 Encounters:  07/03/22 86         Passed - Valid encounter within last 6 months    Recent Outpatient Visits           2 months ago Essential hypertension   [place] [facility] [place], Cornelius Moras, RPH-CPP   3 months ago Need for vaccination for Strep pneumoniae   [place] [facility] [place], [place] L, RPH-CPP   5 months ago Type 2 diabetes mellitus with hyperlipidemia [facility])   [place] [facility] Jonah Blue B, MD   8 months ago Type 2 diabetes mellitus with hyperlipidemia [facility])   [place] [facility] [place], [place], [place]   1 year ago Type 2 diabetes mellitus with hyperlipidemia [facility])   [place] [facility] Marcine Matar, MD       Future Appointments             In 2 weeks Marcine Matar, MD [facility]

## 2022-09-13 ENCOUNTER — Encounter: Payer: Self-pay | Admitting: *Deleted

## 2022-09-13 NOTE — Progress Notes (Signed)
[facility] Quality Team Note  Name: Stephanie Neal Date of Birth: Mar 11, 1956 MRN: [id number] Date: 09/13/2022  [facility] Quality Team has reviewed this patient's chart, please see recommendations below:  [facility] Quality Other; Pt has open gaps for A1C and eye screening.  Called pt and she declined eye event 12/22/22 at the practice.  Do you think your provider could address A1C and re-offer eye event at her ov on 09/29/22 if it's not too much trouble?  Thanks so much!

## 2022-09-22 ENCOUNTER — Other Ambulatory Visit: Payer: Self-pay | Admitting: Internal Medicine

## 2022-09-22 DIAGNOSIS — E1169 Type 2 diabetes mellitus with other specified complication: Secondary | ICD-10-CM

## 2022-09-22 NOTE — Telephone Encounter (Signed)
Requested Prescriptions  Pending Prescriptions Disp Refills   glimepiride (AMARYL) 2 MG tablet [Pharmacy Med Name: GLIMEPIRIDE 2 MG TABLET] 270 tablet 0    Sig: TAKE 2 TABS (4 MG) IN THE MORNING AND 1 TABLET (2 MG) IN THE AFTERNOON.     Endocrinology:  Diabetes - Sulfonylureas Passed - 09/22/2022 12:10 AM      Passed - HBA1C is between 0 and 7.9 and within 180 days    HbA1c, POC (controlled diabetic range)  Date Value Ref Range Status  04/13/2022 7.4 (A) 0.0 - 7.0 % Final         Passed - Cr in normal range and within 360 days    Creat  Date Value Ref Range Status  04/04/2016 1.11 (H) 0.50 - 0.99 mg/dL Final    Comment:      For patients > or = 68 years of age: The upper reference limit for Creatinine is approximately 13% higher for people identified as African-American.      Creatinine, Ser  Date Value Ref Range Status  12/21/2021 0.74 0.57 - 1.00 mg/dL Final   Creatinine, Urine  Date Value Ref Range Status  04/04/2016 43 20 - 320 mg/dL Final         Passed - Valid encounter within last 6 months    Recent Outpatient Visits           2 months ago Essential hypertension   [place] [facility] [place], Cornelius Moras, RPH-CPP   4 months ago Need for vaccination for Strep pneumoniae   [facility] [place], [place] L, RPH-CPP   5 months ago Type 2 diabetes mellitus with hyperlipidemia [facility])   [place] [facility] Jonah Blue B, MD   9 months ago Type 2 diabetes mellitus with hyperlipidemia [facility])   [place] [facility] [place], [place], [place]   1 year ago Type 2 diabetes mellitus with hyperlipidemia [facility])    [facility] Marcine Matar, MD       Future Appointments             In 1 week Marcine Matar, MD [facility]

## 2022-09-29 ENCOUNTER — Ambulatory Visit: Payer: 59 | Attending: Internal Medicine | Admitting: Internal Medicine

## 2022-09-29 ENCOUNTER — Encounter: Payer: Self-pay | Admitting: Internal Medicine

## 2022-09-29 VITALS — BP 116/70 | HR 91 | Ht 72.0 in | Wt 172.2 lb

## 2022-09-29 DIAGNOSIS — E785 Hyperlipidemia, unspecified: Secondary | ICD-10-CM | POA: Diagnosis not present

## 2022-09-29 DIAGNOSIS — Z7984 Long term (current) use of oral hypoglycemic drugs: Secondary | ICD-10-CM | POA: Diagnosis not present

## 2022-09-29 DIAGNOSIS — E1159 Type 2 diabetes mellitus with other circulatory complications: Secondary | ICD-10-CM | POA: Diagnosis not present

## 2022-09-29 DIAGNOSIS — I152 Hypertension secondary to endocrine disorders: Secondary | ICD-10-CM

## 2022-09-29 DIAGNOSIS — E1169 Type 2 diabetes mellitus with other specified complication: Secondary | ICD-10-CM

## 2022-09-29 LAB — POCT GLYCOSYLATED HEMOGLOBIN (HGB A1C): Hemoglobin A1C: 7.6 % — AB (ref 4.0–5.6)

## 2022-09-29 LAB — GLUCOSE, POCT (MANUAL RESULT ENTRY): POC Glucose: 80 mg/dl (ref 70–99)

## 2022-09-29 MED ORDER — GLIMEPIRIDE 2 MG PO TABS: ORAL_TABLET | ORAL | 1 refills | Status: DC

## 2022-09-29 MED ORDER — AMLODIPINE BESYLATE 10 MG PO TABS: 10.0000 mg | ORAL_TABLET | Freq: Every day | ORAL | 1 refills | Status: DC

## 2022-09-29 MED ORDER — ATORVASTATIN CALCIUM 40 MG PO TABS: 40.0000 mg | ORAL_TABLET | Freq: Every day | ORAL | 3 refills | Status: DC

## 2022-09-29 MED ORDER — LISINOPRIL 30 MG PO TABS: 30.0000 mg | ORAL_TABLET | Freq: Every day | ORAL | 1 refills | Status: DC

## 2022-09-29 MED ORDER — METFORMIN HCL 1000 MG PO TABS: ORAL_TABLET | ORAL | 1 refills | Status: DC

## 2022-09-29 NOTE — Patient Instructions (Signed)

## 2022-09-29 NOTE — Progress Notes (Signed)
No concerns. 

## 2022-09-29 NOTE — Progress Notes (Signed)
Patient ID: Eua Lunt, female    DOB: Jan 04, 1956  MRN: [id number]  CC: Diabetes and Hypertension   Subjective: Stephanie Neal is a 67 y.o. female who presents for chronic ds management Her concerns today include:  Hx of DM, HTN, former smoker, obesity, HL, vocal polyp removed 12/2021.   She completed PT on her back and found it very helpful.  No further falls.  DM: Results for orders placed or performed in visit on 09/29/22  POCT glucose (manual entry)  Result Value Ref Range   POC Glucose 80 70 - 99 mg/dl  W0J 7.6 Reports compliance with meds Metformin 1 gram BID and Amaryl 4mg  a.m and 2 mg p.m -Feels she does well with eating habits.  Eating more fruits and salads.  Uses creamer in her coffee and drinks about 3 cups a day.  Drinks sodas occasionally. Walks up and down the stairs in her house and walks up and down the block on her street several days a wk  HTN Currently taking: see medication list.  She is on amlodipine 10 mg daily, lisinopril 30 mg daily and took already for today Med Adherence: [x]  Yes    []  No Medication side effects: []  Yes    [x]  No Adherence with salt restriction: [x]  Yes    []  No Home Monitoring?: []  Yes    [x]  No but has device Monitoring Frequency:  Home BP results range:  SOB? []  Yes    [x]  No Chest Pain?: []  Yes    [x]  No Leg swelling?: []  Yes    [x]  No Headaches?: []  Yes    [x]  No Dizziness? []  Yes    [x]  No Comments:   HL: Taking and tolerating atorvastatin 40 mg daily.  She remains free of cigarette.  However her significant other smokes which makes it hard for her in terms of craving.  HM:  has appt with [facility] 10/10/2022.  Referred to Dr. Renaldo Reel for c-scope.  They told pt that she is not due again until 01/2024.  Declines Shingles vaccine.  Patient Active Problem List   Diagnosis Date Noted   Aortic atherosclerosis (HCC) 05/05/2021   Tetanus, diphtheria, and acellular pertussis (Tdap) vaccination declined  05/10/2020   Hyperlipidemia associated with type 2 diabetes mellitus (HCC) 11/18/2019   Chronic pain of right knee 11/18/2019   Mixed hyperlipidemia 06/25/2018   Over weight 06/25/2018   Stress due to family tension 11/13/2017   DM type 2 (diabetes mellitus, type 2) (HCC) 03/05/2014   HYPERCHOLESTEROLEMIA 03/12/2007   OBESITY 03/12/2007   Tobacco dependence 03/12/2007   HTN (hypertension) 03/12/2007   ALLERGIC RHINITIS, SEASONAL 03/12/2007     Current Outpatient Medications on File Prior to Visit  Medication Sig Dispense Refill   amLODipine (NORVASC) 10 MG tablet TAKE 1 TABLET BY MOUTH EVERY DAY 90 tablet 1   atorvastatin (LIPITOR) 40 MG tablet Take 1 tablet (40 mg total) by mouth daily. 90 tablet 3   glimepiride (AMARYL) 2 MG tablet TAKE 2 TABS (4 MG) IN THE MORNING AND 1 TABLET (2 MG) IN THE AFTERNOON. 270 tablet 0   lidocaine (LIDODERM) 5 % PLACE 1 PATCH ONTO THE SKIN DAILY. REMOVE & DISCARD PATCH WITHIN 12 HOURS OR AS DIRECTED BY MD 30 patch 0   lisinopril (ZESTRIL) 30 MG tablet TAKE 1 TABLET BY MOUTH EVERY DAY 90 tablet 0   metFORMIN (GLUCOPHAGE) 1000 MG tablet TAKE 1 TABLET BY MOUTH 2 (TWO) TIMES DAILY WITH A MEAL. FOR  DIABETES 180 tablet 1   albuterol (VENTOLIN HFA) 108 (90 Base) MCG/ACT inhaler Inhale 1-2 puffs into the lungs every 6 (six) hours as needed for wheezing or shortness of breath. (Patient not taking: Reported on 04/13/2022) 18 g 1   Blood Glucose Monitoring Suppl (ACCU-CHEK GUIDE ME) w/Device KIT Use as directed (Patient not taking: Reported on 09/29/2022) 1 kit 0   glucose blood (ACCU-CHEK GUIDE) test strip Use as instructed (Patient not taking: Reported on 09/29/2022) 100 each 12   Lancets Misc. (ACCU-CHEK FASTCLIX LANCET) KIT Use as directed (Patient not taking: Reported on 09/29/2022) 100 kit 6   No current facility-administered medications on file prior to visit.    Allergies  Allergen Reactions   Penicillins Other (See Comments)    "blanked out"  Has patient  had a PCN reaction causing immediate rash, facial/tongue/throat swelling, SOB or lightheadedness with hypotension: No  Has patient had a PCN reaction causing severe rash involving mucus membranes or skin necrosis: No  Has patient had a PCN reaction that required hospitalization No  Has patient had a PCN reaction occurring within the last 10 years: No  If all of the above answers are "NO", then may proceed with Cephalosporin use.  Other Reaction(s): Dizziness    Social History   Socioeconomic History   Marital status: Single    Spouse name: Not on file   Number of children: Not on file   Years of education: Not on file   Highest education level: Not on file  Occupational History   Not on file  Tobacco Use   Smoking status: Former   Smokeless tobacco: Never  Vaping Use   Vaping Use: Never used  Substance and Sexual Activity   Alcohol use: No   Drug use: No   Sexual activity: Not on file  Other Topics Concern   Not on file  Social History Narrative   Not on file   Social Determinants of Health   Financial Resource Strain: Low Risk  (05/26/2022)   Overall Financial Resource Strain (CARDIA)    Difficulty of Paying Living Expenses: Not hard at all  Food Insecurity: No Food Insecurity (05/26/2022)   Hunger Vital Sign    Worried About Running Out of Food in the Last Year: Never true    Ran Out of Food in the Last Year: Never true  Transportation Needs: No Transportation Needs (05/26/2022)   PRAPARE - Administrator, Civil Service (Medical): No    Lack of Transportation (Non-Medical): No  Physical Activity: Insufficiently Active (05/26/2022)   Exercise Vital Sign    Days of Exercise per Week: 2 days    Minutes of Exercise per Session: 40 min  Stress: No Stress Concern Present (05/26/2022)   [employer] of Occupational Health - Occupational Stress Questionnaire    Feeling of Stress : Not at all  Social Connections: Moderately Integrated (05/26/2022)   Social  Connection and Isolation Panel [NHANES]    Frequency of Communication with Friends and Family: Twice a week    Frequency of Social Gatherings with Friends and Family: Once a week    Attends Religious Services: 1 to 4 times per year    Active Member of [employer] or Organizations: Yes    Attends Banker Meetings: 1 to 4 times per year    Marital Status: Widowed  Intimate Partner Violence: Not on file    Family History  Problem Relation Age of Onset   Hypertension Mother    Diabetes  Mother    COPD Father    Heart disease Father    Diabetes Father     Past Surgical History:  Procedure Laterality Date   FOOT SURGERY     MICROLARYNGOSCOPY N/A 01/18/2022   Procedure: MICROLARYNGOSCOPY WITH EXCISION OF POLYPS;  Surgeon: Serena Colonel, MD;  Location: [facility] OR;  Service: ENT;  Laterality: N/A;    ROS: Review of Systems Negative except as stated above  PHYSICAL EXAM: BP 116/70 (BP Location: Left Arm, Patient Position: Sitting, Cuff Size: Normal)   Pulse 91   Ht 6' (1.829 m)   Wt 172 lb 3.2 oz (78.1 kg)   SpO2 98%   BMI 23.35 kg/m   Physical Exam  General appearance - alert, well appearing, older African-American female and in no distress.  She has some audible hoarseness Mental status - normal mood, behavior, speech, dress, motor activity, and thought processes Neck - supple, no significant adenopathy Chest - clear to auscultation, no wheezes, rales or rhonchi, symmetric air entry Heart - normal rate, regular rhythm, normal S1, S2, no murmurs, rubs, clicks or gallops Extremities - peripheral pulses normal, no pedal edema, no clubbing or cyanosis Diabetic Foot Exam - Simple   Simple Foot Form Diabetic Foot exam was performed with the following findings: Yes 09/29/2022  3:29 PM  Visual Inspection See comments: Yes Sensation Testing Intact to touch and monofilament testing bilaterally: Yes Pulse Check Posterior Tibialis and Dorsalis pulse intact bilaterally:  Yes Comments Non-inflam bunions on both big toes, mild flat footed         Latest Ref Rng & Units 12/21/2021   11:05 AM 11/18/2021   10:13 AM 12/31/2020   11:38 AM  CMP  Glucose 70 - 99 mg/dL 696  295  84   BUN 8 - 27 mg/dL 9  10  10    Creatinine 0.57 - 1.00 mg/dL 2.84  1.32  4.40   Sodium 134 - 144 mmol/L 141  140  142   Potassium 3.5 - 5.2 mmol/L 4.1  4.1  4.0   Chloride 96 - 106 mmol/L 103  106  106   CO2 20 - 29 mmol/L 25  25  22    Calcium 8.7 - 10.3 mg/dL 9.3  9.0  9.5   Total Protein 6.0 - 8.5 g/dL 6.2   6.4   Total Bilirubin 0.0 - 1.2 mg/dL 0.3   0.2   Alkaline Phos 44 - 121 IU/L 58   59   AST 0 - 40 IU/L 14   14   ALT 0 - 32 IU/L 10   11    Lipid Panel     Component Value Date/Time   CHOL 159 12/21/2021 1105   TRIG 73 12/21/2021 1105   HDL 49 12/21/2021 1105   CHOLHDL 3.2 12/21/2021 1105   CHOLHDL 5.6 (H) 06/09/2015 1023   VLDL 22 06/09/2015 1023   LDLCALC 96 12/21/2021 1105    CBC    Component Value Date/Time   WBC 5.0 12/21/2021 1105   WBC 4.9 11/18/2021 1013   RBC 4.91 12/21/2021 1105   RBC 5.13 (H) 11/18/2021 1013   HGB 13.4 12/21/2021 1105   HCT 39.6 12/21/2021 1105   PLT 218 12/21/2021 1105   MCV 81 12/21/2021 1105   MCH 27.3 12/21/2021 1105   MCH 27.5 11/18/2021 1013   MCHC 33.8 12/21/2021 1105   MCHC 33.9 11/18/2021 1013   RDW 13.0 12/21/2021 1105   LYMPHSABS 1.2 12/21/2021 1105   MONOABS 0.5 11/18/2021 1013  EOSABS 0.1 12/21/2021 1105   BASOSABS 0.0 12/21/2021 1105    ASSESSMENT AND PLAN:  1. Type 2 diabetes mellitus with hyperlipidemia (HCC) Not at goal.  Advised that her goal is to get her A1c less than 7.  Patient is not interested in increased dose of Amaryl or adding another medication.  She would like to continue current dose of Amaryl and metformin.  Discussed and encourage healthy eating habits.  Advised to cut back on the sugary creamer in her coffee. - POCT glycosylated hemoglobin (Hb A1C) - POCT glucose (manual entry) -  Microalbumin / creatinine urine ratio - glimepiride (AMARYL) 2 MG tablet; TAKE 2 TABS (4 MG) IN THE MORNING AND 1 TABLET (2 MG) IN THE AFTERNOON.  Dispense: 270 tablet; Refill: 1 - metFORMIN (GLUCOPHAGE) 1000 MG tablet; TAKE 1 TABLET BY MOUTH 2 (TWO) TIMES DAILY WITH A MEAL. FOR DIABETES  Dispense: 180 tablet; Refill: 1 - atorvastatin (LIPITOR) 40 MG tablet; Take 1 tablet (40 mg total) by mouth daily.  Dispense: 90 tablet; Refill: 3  2. Hypertension associated with diabetes (HCC) At goal.  Continue amlodipine and lisinopril. - amLODipine (NORVASC) 10 MG tablet; Take 1 tablet (10 mg total) by mouth daily.  Dispense: 90 tablet; Refill: 1 - lisinopril (ZESTRIL) 30 MG tablet; Take 1 tablet (30 mg total) by mouth daily.  Dispense: 90 tablet; Refill: 1   Patient was given the opportunity to ask questions.  Patient verbalized understanding of the plan and was able to repeat key elements of the plan.   This documentation was completed using Paediatric nurse.  Any transcriptional errors are unintentional.  Orders Placed This Encounter  Procedures   POCT glycosylated hemoglobin (Hb A1C)   POCT glucose (manual entry)     Requested Prescriptions    No prescriptions requested or ordered in this encounter    No follow-ups on file.  Jonah Blue, MD, FACP

## 2022-09-30 LAB — MICROALBUMIN / CREATININE URINE RATIO
Creatinine, Urine: 28.4 mg/dL
Microalb/Creat Ratio: 21 mg/g creat (ref 0–29)
Microalbumin, Urine: 6 ug/mL

## 2022-11-28 ENCOUNTER — Inpatient Hospital Stay: Admission: RE | Admit: 2022-11-28 | Payer: 59 | Source: Ambulatory Visit

## 2022-12-01 ENCOUNTER — Encounter (HOSPITAL_COMMUNITY): Payer: Self-pay | Admitting: Emergency Medicine

## 2022-12-01 ENCOUNTER — Other Ambulatory Visit: Payer: Self-pay

## 2022-12-01 ENCOUNTER — Ambulatory Visit (HOSPITAL_COMMUNITY)
Admission: EM | Admit: 2022-12-01 | Discharge: 2022-12-01 | Disposition: A | Discharge status: Home / Self Care | Payer: 59 | Attending: Family Medicine | Admitting: Family Medicine

## 2022-12-01 DIAGNOSIS — R490 Dysphonia: Secondary | ICD-10-CM | POA: Diagnosis not present

## 2022-12-01 DIAGNOSIS — J441 Chronic obstructive pulmonary disease with (acute) exacerbation: Secondary | ICD-10-CM

## 2022-12-01 DIAGNOSIS — J019 Acute sinusitis, unspecified: Secondary | ICD-10-CM

## 2022-12-01 MED ORDER — ALBUTEROL SULFATE HFA 108 (90 BASE) MCG/ACT IN AERS: 2.0000 | INHALATION_SPRAY | RESPIRATORY_TRACT | 0 refills | Status: DC | PRN

## 2022-12-01 MED ORDER — DOXYCYCLINE HYCLATE 100 MG PO CAPS: 100.0000 mg | ORAL_CAPSULE | Freq: Two times a day (BID) | ORAL | 0 refills | Status: AC

## 2022-12-01 MED ORDER — PREDNISONE 20 MG PO TABS: 40.0000 mg | ORAL_TABLET | Freq: Every day | ORAL | 0 refills | Status: AC

## 2022-12-01 NOTE — ED Provider Notes (Signed)
[facility]    CSN: [id number] Arrival date & time: 12/01/22  1521      History   Chief Complaint Chief Complaint  Patient presents with   Hoarse    HPI Stephanie Neal is a 67 y.o. female.   HPI Here for hoarseness, chest and postnasal congestion and cough.  Symptoms have been going on for about 3 weeks.  The hoarseness will get better at times but then will return.  She does feel like she has white mucus to clear out of her throat.  She also is having some sinus pressure and congestion.  No fever or chills.  No nausea vomiting or diarrhea.  Has used an inhaler in the past with good effect  She had a laryngeal surgery in about September 2023 that was at least some excision of polyps.  Past Medical History:  Diagnosis Date   Diabetes mellitus    Hyperlipidemia    Hypertension     Patient Active Problem List   Diagnosis Date Noted   Aortic atherosclerosis (HCC) 05/05/2021   Tetanus, diphtheria, and acellular pertussis (Tdap) vaccination declined 05/10/2020   Hyperlipidemia associated with type 2 diabetes mellitus (HCC) 11/18/2019   Chronic pain of right knee 11/18/2019   Mixed hyperlipidemia 06/25/2018   Over weight 06/25/2018   Stress due to family tension 11/13/2017   DM type 2 (diabetes mellitus, type 2) (HCC) 03/05/2014   HYPERCHOLESTEROLEMIA 03/12/2007   OBESITY 03/12/2007   Tobacco dependence 03/12/2007   HTN (hypertension) 03/12/2007   ALLERGIC RHINITIS, SEASONAL 03/12/2007    Past Surgical History:  Procedure Laterality Date   FOOT SURGERY     MICROLARYNGOSCOPY N/A 01/18/2022   Procedure: MICROLARYNGOSCOPY WITH EXCISION OF POLYPS;  Surgeon: Serena Colonel, MD;  Location: [facility] OR;  Service: ENT;  Laterality: N/A;    OB History   No obstetric history on file.      Home Medications    Prior to Admission medications   Medication Sig Start Date End Date Taking? Authorizing Provider  albuterol (VENTOLIN HFA) 108 (90 Base) MCG/ACT  inhaler Inhale 2 puffs into the lungs every 4 (four) hours as needed for wheezing or shortness of breath. 12/01/22  Yes Zenia Resides, MD  doxycycline (VIBRAMYCIN) 100 MG capsule Take 1 capsule (100 mg total) by mouth 2 (two) times daily for 7 days. 12/01/22 12/08/22 Yes Jasir Rother, Janace Aris, MD  predniSONE (DELTASONE) 20 MG tablet Take 2 tablets (40 mg total) by mouth daily with breakfast for 5 days. 12/01/22 12/06/22 Yes Zenia Resides, MD  amLODipine (NORVASC) 10 MG tablet Take 1 tablet (10 mg total) by mouth daily. 09/29/22   Marcine Matar, MD  atorvastatin (LIPITOR) 40 MG tablet Take 1 tablet (40 mg total) by mouth daily. 09/29/22   Marcine Matar, MD  Blood Glucose Monitoring Suppl (ACCU-CHEK GUIDE ME) w/Device KIT Use as directed Patient not taking: Reported on 09/29/2022 05/05/21   Marcine Matar, MD  glimepiride (AMARYL) 2 MG tablet TAKE 2 TABS (4 MG) IN THE MORNING AND 1 TABLET (2 MG) IN THE AFTERNOON. 09/29/22   Marcine Matar, MD  glucose blood (ACCU-CHEK GUIDE) test strip Use as instructed Patient not taking: Reported on 09/29/2022 05/05/21   Marcine Matar, MD  Lancets Misc. (ACCU-CHEK FASTCLIX LANCET) KIT Use as directed Patient not taking: Reported on 09/29/2022 05/05/21   Marcine Matar, MD  lidocaine (LIDODERM) 5 % PLACE 1 PATCH ONTO THE SKIN DAILY. REMOVE & DISCARD PATCH WITHIN 12  HOURS OR AS DIRECTED BY MD 05/30/22   Marcine Matar, MD  lisinopril (ZESTRIL) 30 MG tablet Take 1 tablet (30 mg total) by mouth daily. 09/29/22   Marcine Matar, MD  metFORMIN (GLUCOPHAGE) 1000 MG tablet TAKE 1 TABLET BY MOUTH 2 (TWO) TIMES DAILY WITH A MEAL. FOR DIABETES 09/29/22   Marcine Matar, MD    Family History Family History  Problem Relation Age of Onset   Hypertension Mother    Diabetes Mother    COPD Father    Heart disease Father    Diabetes Father     Social History Social History   Tobacco Use   Smoking status: Former   Smokeless tobacco: Never  Vaping  Use   Vaping status: Never Used  Substance Use Topics   Alcohol use: No   Drug use: No     Allergies   Penicillins   Review of Systems Review of Systems   Physical Exam Triage Vital Signs ED Triage Vitals  Encounter Vitals Group     BP 12/01/22 1539 (!) 146/75     Systolic BP Percentile --      Diastolic BP Percentile --      Pulse Rate 12/01/22 1539 (!) 105     Resp 12/01/22 1539 18     Temp 12/01/22 1539 98 F (36.7 C)     Temp Source 12/01/22 1539 Oral     SpO2 12/01/22 1539 95 %     Weight --      Height --      Head Circumference --      Peak Flow --      Pain Score 12/01/22 1541 0     Pain Loc --      Pain Education --      Exclude from Growth Chart --    No data found.  Updated Vital Signs BP (!) 146/75 (BP Location: Left Arm)   Pulse (!) 105   Temp 98 F (36.7 C) (Oral)   Resp 18   SpO2 95%   Visual Acuity Right Eye Distance:   Left Eye Distance:   Bilateral Distance:    Right Eye Near:   Left Eye Near:    Bilateral Near:     Physical Exam Vitals reviewed.  Constitutional:      General: She is not in acute distress.    Appearance: She is not toxic-appearing.  HENT:     Right Ear: Tympanic membrane and ear canal normal.     Left Ear: Tympanic membrane and ear canal normal.     Nose: Congestion present.     Mouth/Throat:     Mouth: Mucous membranes are moist.     Comments: There is white mucus draining in the oropharynx. Eyes:     Extraocular Movements: Extraocular movements intact.     Conjunctiva/sclera: Conjunctivae normal.     Pupils: Pupils are equal, round, and reactive to light.  Cardiovascular:     Rate and Rhythm: Normal rate and regular rhythm.     Heart sounds: No murmur heard. Pulmonary:     Effort: No respiratory distress.     Breath sounds: No stridor. No rhonchi or rales.  Chest:     Chest wall: No tenderness.  Musculoskeletal:     Cervical back: Neck supple.  Lymphadenopathy:     Cervical: No cervical  adenopathy.  Skin:    Capillary Refill: Capillary refill takes less than 2 seconds.     Coloration: Skin is  not jaundiced or pale.  Neurological:     General: No focal deficit present.     Mental Status: She is alert and oriented to person, place, and time.  Psychiatric:        Behavior: Behavior normal.      UC Treatments / Results  Labs (all labs ordered are listed, but only abnormal results are displayed) Labs Reviewed - No data to display  EKG   Radiology No results found.  Procedures Procedures (including critical care time)  Medications Ordered in UC Medications - No data to display  Initial Impression / Assessment and Plan / UC Course  I have reviewed the triage vital signs and the nursing notes.  Pertinent labs & imaging results that were available during my care of the patient were reviewed by me and considered in my medical decision making (see chart for details).        Doxycycline is sent in to treat possible sinusitis.  Albuterol and prednisone are sent in for possible asthma or COPD exacerbation.  I have asked her to follow-up with her ENT who did her surgery last year and with her primary care. Final Clinical Impressions(s) / UC Diagnoses   Final diagnoses:  Acute sinusitis, recurrence not specified, unspecified location  COPD exacerbation (HCC)  Hoarseness     Discharge Instructions      Albuterol inhaler--do 2 puffs every 4 hours as needed for shortness of breath or wheezing  Take doxycycline 100 mg --1 capsule 2 times daily for 7 days  Take prednisone 20 mg--2 daily for 5 days; this medication can make your sugars go higher.  Please drink lots of fluids while you are taking it and be as good as you can on your diet.  Please follow-up with your primary care  Also please follow-up with Dr. Brynda Peon, the ENT doctor who did your surgery last year     ED Prescriptions     Medication Sig Dispense Auth. Provider   albuterol  (VENTOLIN HFA) 108 (90 Base) MCG/ACT inhaler Inhale 2 puffs into the lungs every 4 (four) hours as needed for wheezing or shortness of breath. 1 each Zenia Resides, MD   predniSONE (DELTASONE) 20 MG tablet Take 2 tablets (40 mg total) by mouth daily with breakfast for 5 days. 10 tablet Zenia Resides, MD   doxycycline (VIBRAMYCIN) 100 MG capsule Take 1 capsule (100 mg total) by mouth 2 (two) times daily for 7 days. 14 capsule Marlinda Mike, Janace Aris, MD      PDMP not reviewed this encounter.   Zenia Resides, MD 12/01/22 [phone]

## 2022-12-01 NOTE — Discharge Instructions (Addendum)
Albuterol inhaler--do 2 puffs every 4 hours as needed for shortness of breath or wheezing  Take doxycycline 100 mg --1 capsule 2 times daily for 7 days  Take prednisone 20 mg--2 daily for 5 days; this medication can make your sugars go higher.  Please drink lots of fluids while you are taking it and be as good as you can on your diet.  Please follow-up with your primary care  Also please follow-up with Dr. Brynda Peon, the ENT doctor who did your surgery last year

## 2022-12-01 NOTE — ED Triage Notes (Addendum)
Patient presents to [facility] for evaluation of a build up of mucous in her throat that sprays out when she sneezes.  States its green.  Denies coughing or fever.  Hx of throat surgery in the past few months.  States her voice comes and goes, currently hoarse in room

## 2022-12-30 ENCOUNTER — Other Ambulatory Visit: Payer: Self-pay | Admitting: Internal Medicine

## 2022-12-30 DIAGNOSIS — E1169 Type 2 diabetes mellitus with other specified complication: Secondary | ICD-10-CM

## 2023-01-02 NOTE — Telephone Encounter (Signed)
Requested Prescriptions  Pending Prescriptions Disp Refills   metFORMIN (GLUCOPHAGE) 1000 MG tablet [Pharmacy Med Name: METFORMIN HCL 1,000 MG TABLET] 180 tablet 0    Sig: TAKE 1 TABLET BY MOUTH 2 (TWO) TIMES DAILY WITH A MEAL. FOR DIABETES     Endocrinology:  Diabetes - Biguanides Failed - 12/30/2022  7:42 PM      Failed - Cr in normal range and within 360 days    Creat  Date Value Ref Range Status  04/04/2016 1.11 (H) 0.50 - 0.99 mg/dL Final    Comment:      For patients > or = 68 years of age: The upper reference limit for Creatinine is approximately 13% higher for people identified as African-American.      Creatinine, Ser  Date Value Ref Range Status  12/21/2021 0.74 0.57 - 1.00 mg/dL Final   Creatinine, Urine  Date Value Ref Range Status  04/04/2016 43 20 - 320 mg/dL Final         Failed - eGFR in normal range and within 360 days    GFR, Est African American  Date Value Ref Range Status  04/04/2016 62 >=60 mL/min Final   GFR calc Af Amer  Date Value Ref Range Status  05/19/2019 91 >59 mL/min/1.73 Final   GFR, Est Non African American  Date Value Ref Range Status  04/04/2016 54 (L) >=60 mL/min Final   GFR, Estimated  Date Value Ref Range Status  11/18/2021 >60 >60 mL/min Final    Comment:    (NOTE) Calculated using the CKD-EPI Creatinine Equation (2021)    eGFR  Date Value Ref Range Status  12/21/2021 89 >59 mL/min/1.73 Final         Failed - B12 Level in normal range and within 720 days    No results found for: "VITAMINB12"       Failed - CBC within normal limits and completed in the last 12 months    WBC  Date Value Ref Range Status  12/21/2021 5.0 3.4 - 10.8 x10E3/uL Final  11/18/2021 4.9 4.0 - 10.5 K/uL Final   RBC  Date Value Ref Range Status  12/21/2021 4.91 3.77 - 5.28 x10E6/uL Final  11/18/2021 5.13 (H) 3.87 - 5.11 MIL/uL Final   Hemoglobin  Date Value Ref Range Status  12/21/2021 13.4 11.1 - 15.9 g/dL Final   Hematocrit  Date Value  Ref Range Status  12/21/2021 39.6 34.0 - 46.6 % Final   MCHC  Date Value Ref Range Status  12/21/2021 33.8 31.5 - 35.7 g/dL Final  53/66/4403 47.4 30.0 - 36.0 g/dL Final   [facility]  Date Value Ref Range Status  12/21/2021 27.3 26.6 - 33.0 pg Final  11/18/2021 27.5 26.0 - 34.0 pg Final   MCV  Date Value Ref Range Status  12/21/2021 81 79 - 97 fL Final   No results found for: "PLTCOUNTKUC", "LABPLAT", "POCPLA" RDW  Date Value Ref Range Status  12/21/2021 13.0 11.7 - 15.4 % Final         Passed - HBA1C is between 0 and 7.9 and within 180 days    Hemoglobin A1C  Date Value Ref Range Status  09/29/2022 7.6 (A) 4.0 - 5.6 % Final   HbA1c, POC (controlled diabetic range)  Date Value Ref Range Status  04/13/2022 7.4 (A) 0.0 - 7.0 % Final         Passed - Valid encounter within last 6 months    Recent Outpatient Visits  3 months ago Type 2 diabetes mellitus with hyperlipidemia [facility])   [place] [facility] Marcine Matar, MD   6 months ago Essential hypertension   [place] [facility] [place], Cornelius Moras, RPH-CPP   7 months ago Need for vaccination for Strep pneumoniae   [facility] [place], Jeannett Senior L, RPH-CPP   8 months ago Type 2 diabetes mellitus with hyperlipidemia [facility])   [place] [facility] Marcine Matar, MD   1 year ago Type 2 diabetes mellitus with hyperlipidemia [facility])   [place] [facility] [place], Marzella Schlein, [place]       Future Appointments             In 1 month Laural Benes, Binnie Rail, MD [facility]

## 2023-02-08 ENCOUNTER — Encounter: Payer: Self-pay | Admitting: Internal Medicine

## 2023-02-08 ENCOUNTER — Ambulatory Visit: Payer: 59 | Attending: Internal Medicine | Admitting: Internal Medicine

## 2023-02-08 VITALS — BP 137/77 | HR 99 | Temp 98.2°F | Ht 72.0 in | Wt 182.0 lb

## 2023-02-08 DIAGNOSIS — I700 Atherosclerosis of aorta: Secondary | ICD-10-CM

## 2023-02-08 DIAGNOSIS — R490 Dysphonia: Secondary | ICD-10-CM

## 2023-02-08 DIAGNOSIS — Z23 Encounter for immunization: Secondary | ICD-10-CM

## 2023-02-08 DIAGNOSIS — E119 Type 2 diabetes mellitus without complications: Secondary | ICD-10-CM

## 2023-02-08 DIAGNOSIS — E785 Hyperlipidemia, unspecified: Secondary | ICD-10-CM | POA: Diagnosis not present

## 2023-02-08 DIAGNOSIS — Z7984 Long term (current) use of oral hypoglycemic drugs: Secondary | ICD-10-CM

## 2023-02-08 DIAGNOSIS — I152 Hypertension secondary to endocrine disorders: Secondary | ICD-10-CM

## 2023-02-08 DIAGNOSIS — Z87891 Personal history of nicotine dependence: Secondary | ICD-10-CM

## 2023-02-08 DIAGNOSIS — E1159 Type 2 diabetes mellitus with other circulatory complications: Secondary | ICD-10-CM | POA: Diagnosis not present

## 2023-02-08 DIAGNOSIS — F4320 Adjustment disorder, unspecified: Secondary | ICD-10-CM

## 2023-02-08 DIAGNOSIS — Z77120 Contact with and (suspected) exposure to mold (toxic): Secondary | ICD-10-CM | POA: Diagnosis not present

## 2023-02-08 DIAGNOSIS — F4321 Adjustment disorder with depressed mood: Secondary | ICD-10-CM

## 2023-02-08 DIAGNOSIS — J301 Allergic rhinitis due to pollen: Secondary | ICD-10-CM

## 2023-02-08 DIAGNOSIS — J40 Bronchitis, not specified as acute or chronic: Secondary | ICD-10-CM | POA: Diagnosis not present

## 2023-02-08 DIAGNOSIS — E1169 Type 2 diabetes mellitus with other specified complication: Secondary | ICD-10-CM

## 2023-02-08 LAB — POCT GLYCOSYLATED HEMOGLOBIN (HGB A1C): HbA1c, POC (controlled diabetic range): 7.8 % — AB (ref 0.0–7.0)

## 2023-02-08 LAB — GLUCOSE, POCT (MANUAL RESULT ENTRY): POC Glucose: 276 mg/dL — AB (ref 70–99)

## 2023-02-08 MED ORDER — GLIMEPIRIDE 4 MG PO TABS: ORAL_TABLET | ORAL | 1 refills | Status: DC

## 2023-02-08 MED ORDER — DOXYCYCLINE HYCLATE 100 MG PO TABS: 100.0000 mg | ORAL_TABLET | Freq: Two times a day (BID) | ORAL | 0 refills | Status: DC

## 2023-02-08 MED ORDER — FLUTICASONE PROPIONATE 50 MCG/ACT NA SUSP: 1.0000 | Freq: Every day | NASAL | 6 refills | Status: DC

## 2023-02-08 NOTE — Patient Instructions (Signed)
Please return number to keep the appointment with the ear nose and throat specialist next month. I have referred you to an allergist for your allergy symptoms and mold exposure. I have prescribed Flonase nasal spray for you to use daily.  Continue the Zyrtec which can be purchased over-the-counter. We have increased the glimepiride to 4 mg twice a day.

## 2023-02-08 NOTE — Progress Notes (Signed)
Patient ID: Stephanie Neal, female    DOB: Oct 15, 1955  MRN: [id number]  CC: Diabetes (DM & HTN f/u. Leeanne Rio, sneezing, postnasal drainage X8-9 weeks - requesting to restart meds given at UC (doxycycline & prednisone)/Yes to flu vax. No to shingles vax.)   Subjective: Stephanie Neal is a 67 y.o. female who presents for chronic ds management. Her concerns today include:  Hx of DM, HTN, former smoker, obesity, HL, vocal polyp removed 12/2021.    Seen UC 11/2022 with hoarseness, sneezing and rhinorrhea; had a lot of coughing due to mucous at back of throat.  Dx with acute sinusitis and COPD.  Given Albuterol inhaler, Doxycycline and Prednisone. Did well on the medications but symptoms return about 3 wks after. Currently: sneezing, rhinorrhea, cough productive of green mucous.  + hoarseness that comes in and out.  No SOB, no drainage at back of throat.  Has made f/u appt with ENT for next mth due to the hoarseness.  Patient has history of vocal polyp removed about a year ago.  Hoarseness had improved some postprocedure.   Lives in a two-level apartment.  She states that the bedroom window upstairs that she sleeps in has mold around it.  She feels fine when she is on the downstairs level but feels her symptoms worsen when she goes upstairs.  She has reported to management but they have failed to do anything about it.  She is in the process of seeking out another apartment. -taking Zyrtec, Mucinex BID PRN and using Neti Pot  Recently loss her brother; they were very close.  2 wks later her first counsin pass. Her boyfriend collapsed at home and had to be revived by EMS. coded    Quit smoking last yr after surgery on vocal cord.  However when she recently attended repassed at the 2 funerals, she smoked a little but nothing since then.  Started smoking in her 30's.  Never pack a day smoker.  Most she would smoking 1 days for cigarettes.  DM: Results for orders placed or performed in  visit on 02/08/23  POCT glycosylated hemoglobin (Hb A1C)  Result Value Ref Range   Hemoglobin A1C     HbA1c POC (<> result, manual entry)     HbA1c, POC (prediabetic range)     HbA1c, POC (controlled diabetic range) 7.8 (A) 0.0 - 7.0 %  POCT glucose (manual entry)  Result Value Ref Range   POC Glucose 276 (A) 70 - 99 mg/dl  W0J has increased more since last visit.  Reports compliance with meds Metformin 1 gram BID and Amaryl 4mg  a.m and 2 mg p.m  Feels she does well with eating habits. Not much meat, more veggie and fruits -not checking BS but does not checking.  HTN:   She is on amlodipine 10 mg daily, lisinopril 30 mg daily and took already for today.  No device to check blood pressure.  She limits salt in the foods.  No chest pains or lower extremity edema.  HL/aortic atherosclerosis:  Taking and tolerating atorvastatin 40 mg daily.   HM:  see to flu vaccine.  Will get shingles vaccine but does not want to receive both today.  Agrees to come back for nurse only visit. Patient Active Problem List   Diagnosis Date Noted   Aortic atherosclerosis (HCC) 05/05/2021   Tetanus, diphtheria, and acellular pertussis (Tdap) vaccination declined 05/10/2020   Hyperlipidemia associated with type 2 diabetes mellitus (HCC) 11/18/2019   Chronic pain of right  knee 11/18/2019   Mixed hyperlipidemia 06/25/2018   Over weight 06/25/2018   Stress due to family tension 11/13/2017   DM type 2 (diabetes mellitus, type 2) (HCC) 03/05/2014   HYPERCHOLESTEROLEMIA 03/12/2007   OBESITY 03/12/2007   Tobacco dependence 03/12/2007   HTN (hypertension) 03/12/2007   ALLERGIC RHINITIS, SEASONAL 03/12/2007     Current Outpatient Medications on File Prior to Visit  Medication Sig Dispense Refill   albuterol (VENTOLIN HFA) 108 (90 Base) MCG/ACT inhaler Inhale 2 puffs into the lungs every 4 (four) hours as needed for wheezing or shortness of breath. 1 each 0   amLODipine (NORVASC) 10 MG tablet Take 1 tablet (10 mg  total) by mouth daily. 90 tablet 1   atorvastatin (LIPITOR) 40 MG tablet Take 1 tablet (40 mg total) by mouth daily. 90 tablet 3   Blood Glucose Monitoring Suppl (ACCU-CHEK GUIDE ME) w/Device KIT Use as directed 1 kit 0   glucose blood (ACCU-CHEK GUIDE) test strip Use as instructed 100 each 12   Lancets Misc. (ACCU-CHEK FASTCLIX LANCET) KIT Use as directed 100 kit 6   lidocaine (LIDODERM) 5 % PLACE 1 PATCH ONTO THE SKIN DAILY. REMOVE & DISCARD PATCH WITHIN 12 HOURS OR AS DIRECTED BY MD 30 patch 0   lisinopril (ZESTRIL) 30 MG tablet Take 1 tablet (30 mg total) by mouth daily. 90 tablet 1   metFORMIN (GLUCOPHAGE) 1000 MG tablet TAKE 1 TABLET BY MOUTH 2 (TWO) TIMES DAILY WITH A MEAL. FOR DIABETES 180 tablet 0   No current facility-administered medications on file prior to visit.    Allergies  Allergen Reactions   Penicillins Other (See Comments)    "blanked out"  Has patient had a PCN reaction causing immediate rash, facial/tongue/throat swelling, SOB or lightheadedness with hypotension: No  Has patient had a PCN reaction causing severe rash involving mucus membranes or skin necrosis: No  Has patient had a PCN reaction that required hospitalization No  Has patient had a PCN reaction occurring within the last 10 years: No  If all of the above answers are "NO", then may proceed with Cephalosporin use.  Other Reaction(s): Dizziness    Social History   Socioeconomic History   Marital status: Single    Spouse name: Not on file   Number of children: Not on file   Years of education: Not on file   Highest education level: Not on file  Occupational History   Not on file  Tobacco Use   Smoking status: Former   Smokeless tobacco: Never  Vaping Use   Vaping status: Never Used  Substance and Sexual Activity   Alcohol use: No   Drug use: No   Sexual activity: Not on file  Other Topics Concern   Not on file  Social History Narrative   Not on file   Social Determinants of Health    Financial Resource Strain: Low Risk  (02/08/2023)   Overall Financial Resource Strain (CARDIA)    Difficulty of Paying Living Expenses: Not hard at all  Food Insecurity: No Food Insecurity (02/08/2023)   Hunger Vital Sign    Worried About Running Out of Food in the Last Year: Never true    Ran Out of Food in the Last Year: Never true  Transportation Needs: No Transportation Needs (02/08/2023)   PRAPARE - Administrator, Civil Service (Medical): No    Lack of Transportation (Non-Medical): No  Physical Activity: Insufficiently Active (02/08/2023)   Exercise Vital Sign    Days  of Exercise per Week: 2 days    Minutes of Exercise per Session: 30 min  Stress: No Stress Concern Present (02/08/2023)   [employer] of Occupational Health - Occupational Stress Questionnaire    Feeling of Stress : Not at all  Social Connections: Moderately Integrated (05/26/2022)   Social Connection and Isolation Panel [NHANES]    Frequency of Communication with Friends and Family: Twice a week    Frequency of Social Gatherings with Friends and Family: Once a week    Attends Religious Services: 1 to 4 times per year    Active Member of [employer] or Organizations: Yes    Attends Banker Meetings: 1 to 4 times per year    Marital Status: Widowed  Intimate Partner Violence: Not At Risk (02/08/2023)   Humiliation, Afraid, Rape, and Kick questionnaire    Fear of Current or Ex-Partner: No    Emotionally Abused: No    Physically Abused: No    Sexually Abused: No    Family History  Problem Relation Age of Onset   Hypertension Mother    Diabetes Mother    COPD Father    Heart disease Father    Diabetes Father     Past Surgical History:  Procedure Laterality Date   FOOT SURGERY     MICROLARYNGOSCOPY N/A 01/18/2022   Procedure: MICROLARYNGOSCOPY WITH EXCISION OF POLYPS;  Surgeon: Serena Colonel, MD;  Location: [facility] OR;  Service: ENT;  Laterality: N/A;    ROS: Review of  Systems Negative except as stated above  PHYSICAL EXAM: BP 137/77 (BP Location: Left Arm, Patient Position: Sitting, Cuff Size: Normal)   Pulse 99   Temp 98.2 F (36.8 C) (Oral)   Ht 6' (1.829 m)   Wt 182 lb (82.6 kg)   SpO2 97%   BMI 24.68 kg/m   Physical Exam Repeat blood pressure 140/80 General appearance - alert, well appearing, and in no distress.  Patient with moderate hoarseness and voice straining Mental status - tearful at times when talking about her brother Nose -moderate enlargement of nasal turbinates. Mouth - mucous membranes moist, pharynx normal without lesions Neck - supple, no significant adenopathy Chest - clear to auscultation, no wheezes, rales or rhonchi, symmetric air entry Heart - normal rate, regular rhythm, normal S1, S2, no murmurs, rubs, clicks or gallops Extremities - peripheral pulses normal, no pedal edema, no clubbing or cyanosis     02/08/2023   10:53 AM 09/29/2022    2:05 PM 04/13/2022   10:39 AM  Depression screen PHQ 2/9  Decreased Interest 0 0 0  Down, Depressed, Hopeless 0 0 0  PHQ - 2 Score 0 0 0  Altered sleeping 0 0 0  Tired, decreased energy 0 0 0  Change in appetite 0 0 0  Feeling bad or failure about yourself  0 0 0  Trouble concentrating 0 0 0  Moving slowly or fidgety/restless 0 0 0  Suicidal thoughts 0 0 0  PHQ-9 Score 0 0 0  Difficult doing work/chores Not difficult at all        02/08/2023   10:54 AM 09/29/2022    2:05 PM 04/13/2022   10:39 AM 12/21/2021   10:41 AM  GAD 7 : Generalized Anxiety Score  Nervous, Anxious, on Edge 0 0 0 0  Control/stop worrying 0 0 0 0  Worry too much - different things 0 0 0 0  Trouble relaxing 0 0 0 0  Restless 0 0 0 0  Easily annoyed  or irritable 0 0 0 0  Afraid - awful might happen 0 0 0 0  Total GAD 7 Score 0 0 0 0  Anxiety Difficulty Not difficult at all           Latest Ref Rng & Units 12/21/2021   11:05 AM 11/18/2021   10:13 AM 12/31/2020   11:38 AM  CMP  Glucose 70 - 99  mg/dL 409  811  84   BUN 8 - 27 mg/dL 9  10  10    Creatinine 0.57 - 1.00 mg/dL 9.14  7.82  9.56   Sodium 134 - 144 mmol/L 141  140  142   Potassium 3.5 - 5.2 mmol/L 4.1  4.1  4.0   Chloride 96 - 106 mmol/L 103  106  106   CO2 20 - 29 mmol/L 25  25  22    Calcium 8.7 - 10.3 mg/dL 9.3  9.0  9.5   Total Protein 6.0 - 8.5 g/dL 6.2   6.4   Total Bilirubin 0.0 - 1.2 mg/dL 0.3   0.2   Alkaline Phos 44 - 121 IU/L 58   59   AST 0 - 40 IU/L 14   14   ALT 0 - 32 IU/L 10   11    Lipid Panel     Component Value Date/Time   CHOL 159 12/21/2021 1105   TRIG 73 12/21/2021 1105   HDL 49 12/21/2021 1105   CHOLHDL 3.2 12/21/2021 1105   CHOLHDL 5.6 (H) 06/09/2015 1023   VLDL 22 06/09/2015 1023   LDLCALC 96 12/21/2021 1105    CBC    Component Value Date/Time   WBC 5.0 12/21/2021 1105   WBC 4.9 11/18/2021 1013   RBC 4.91 12/21/2021 1105   RBC 5.13 (H) 11/18/2021 1013   HGB 13.4 12/21/2021 1105   HCT 39.6 12/21/2021 1105   PLT 218 12/21/2021 1105   MCV 81 12/21/2021 1105   MCH 27.3 12/21/2021 1105   MCH 27.5 11/18/2021 1013   MCHC 33.8 12/21/2021 1105   MCHC 33.9 11/18/2021 1013   RDW 13.0 12/21/2021 1105   LYMPHSABS 1.2 12/21/2021 1105   MONOABS 0.5 11/18/2021 1013   EOSABS 0.1 12/21/2021 1105   BASOSABS 0.0 12/21/2021 1105    ASSESSMENT AND PLAN: 1. Type 2 diabetes mellitus with hyperlipidemia (HCC) Not at goal.  Encouraged her to continue healthy eating habits.  Increase Amaryl to 4 mg twice a day.  She currently has the 2 mg tablets.  I told her to start taking 2 in the morning and 2 in the evening until she completes her current bottle.  After that the new prescription will have the 4 mg tablet so she would just have to take 1 tablet twice a day.  She expressed understanding and was able to repeat back instructions.  Continue metformin - POCT glycosylated hemoglobin (Hb A1C) - POCT glucose (manual entry) - CBC - Comprehensive metabolic panel - Lipid panel - glimepiride (AMARYL) 4  MG tablet; 1 tab PO twice a day  Dispense: 180 tablet; Refill: 1  2. Diabetes mellitus treated with oral medication (HCC) See #1 above  3. Hypertension associated with diabetes (HCC) Not at goal.  I recommend increasing lisinopril to 40 mg daily or add another medication to the lisinopril and Norvasc.  Patient declined stating that her blood pressure is elevated today because she was upset and talking about her brother.  She is agreeable to coming back to see the clinical pharmacist in  2 weeks for recheck.  I will have him give her the first Shingrix vaccine at that time.  4. Aortic atherosclerosis (HCC) Continue atorvastatin 40 mg daily.  5. Hoarseness of voice Agree with her getting back in with the ear nose and throat specialist for reevaluation.  She had a vocal polyp removed last year  6. Allergic rhinitis due to pollen, unspecified seasonality She can continue Zyrtec.  We will add Flonase nasal spray.  Mold exposure may be playing a role.  Will get her in with an allergist for testing.  She is currently seeking new housing. - fluticasone (FLONASE) 50 MCG/ACT nasal spray; Place 1 spray into both nostrils daily.  Dispense: 16 g; Refill: 6 - Ambulatory referral to Allergy  7. Bronchitis - doxycycline (VIBRA-TABS) 100 MG tablet; Take 1 tablet (100 mg total) by mouth 2 (two) times daily.  Dispense: 14 tablet; Refill: 0  8. Grief reaction Patient declines medication or counseling at this time.  States that she meditates and prayer.  9. Mold exposure - Ambulatory referral to Allergy  10. Former smoker Encouraged her to remain tobacco free.  Based on the number of years on the amount that she is smoke during that time, she does not meet criteria for lung cancer screening.  11. Encounter for immunization - Flu Vaccine Trivalent High Dose (Fluad)     Patient was given the opportunity to ask questions.  Patient verbalized understanding of the plan and was able to repeat key elements  of the plan.   This documentation was completed using Paediatric nurse.  Any transcriptional errors are unintentional.  Orders Placed This Encounter  Procedures   Flu Vaccine Trivalent High Dose (Fluad)   CBC   Comprehensive metabolic panel   Lipid panel   Ambulatory referral to Allergy   POCT glycosylated hemoglobin (Hb A1C)   POCT glucose (manual entry)     Requested Prescriptions   Signed Prescriptions Disp Refills   fluticasone (FLONASE) 50 MCG/ACT nasal spray 16 g 6    Sig: Place 1 spray into both nostrils daily.   glimepiride (AMARYL) 4 MG tablet 180 tablet 1    Sig: 1 tab PO twice a day   doxycycline (VIBRA-TABS) 100 MG tablet 14 tablet 0    Sig: Take 1 tablet (100 mg total) by mouth 2 (two) times daily.    Return in about 4 months (around 06/11/2023) for Appt with [facility] in 2 wks for BP check and shingles vaccine.  Jonah Blue, MD, FACP

## 2023-02-09 LAB — COMPREHENSIVE METABOLIC PANEL
ALT: 9 [IU]/L (ref 0–32)
AST: 15 [IU]/L (ref 0–40)
Albumin: 4.1 g/dL (ref 3.9–4.9)
Alkaline Phosphatase: 77 [IU]/L (ref 44–121)
BUN/Creatinine Ratio: 10 — ABNORMAL LOW (ref 12–28)
BUN: 8 mg/dL (ref 8–27)
Bilirubin Total: <0.2 mg/dL (ref 0.0–1.2)
CO2: 24 mmol/L (ref 20–29)
Calcium: 9.3 mg/dL (ref 8.7–10.3)
Chloride: 103 mmol/L (ref 96–106)
Creatinine, Ser: 0.78 mg/dL (ref 0.57–1.00)
Globulin, Total: 2.3 g/dL (ref 1.5–4.5)
Glucose: 173 mg/dL — ABNORMAL HIGH (ref 70–99)
Potassium: 4.1 mmol/L (ref 3.5–5.2)
Sodium: 142 mmol/L (ref 134–144)
Total Protein: 6.4 g/dL (ref 6.0–8.5)
eGFR: 83 mL/min/{1.73_m2} (ref >59)

## 2023-02-09 LAB — LIPID PANEL
Chol/HDL Ratio: 3.4 {ratio} (ref 0.0–4.4)
Cholesterol, Total: 171 mg/dL (ref 100–199)
HDL: 50 mg/dL (ref >39)
LDL Chol Calc (NIH): 101 mg/dL — ABNORMAL HIGH (ref 0–99)
Triglycerides: 113 mg/dL (ref 0–149)
VLDL Cholesterol Cal: 20 mg/dL (ref 5–40)

## 2023-02-09 LAB — CBC
Hematocrit: 40.9 % (ref 34.0–46.6)
Hemoglobin: 13.6 g/dL (ref 11.1–15.9)
MCH: 27.4 pg (ref 26.6–33.0)
MCHC: 33.3 g/dL (ref 31.5–35.7)
MCV: 82 fL (ref 79–97)
Platelets: 240 10*3/uL (ref 150–450)
RBC: 4.97 x10E6/uL (ref 3.77–5.28)
RDW: 14.4 % (ref 11.7–15.4)
WBC: 4.2 10*3/uL (ref 3.4–10.8)

## 2023-02-14 ENCOUNTER — Other Ambulatory Visit: Payer: Self-pay | Admitting: Internal Medicine

## 2023-02-14 DIAGNOSIS — E1169 Type 2 diabetes mellitus with other specified complication: Secondary | ICD-10-CM

## 2023-02-14 MED ORDER — ATORVASTATIN CALCIUM 40 MG PO TABS: 60.0000 mg | ORAL_TABLET | Freq: Every day | ORAL | 3 refills | Status: DC

## 2023-02-15 ENCOUNTER — Other Ambulatory Visit: Payer: Self-pay | Admitting: Internal Medicine

## 2023-02-15 ENCOUNTER — Telehealth: Payer: Self-pay | Admitting: Pharmacist

## 2023-02-15 DIAGNOSIS — E1169 Type 2 diabetes mellitus with other specified complication: Secondary | ICD-10-CM

## 2023-02-15 MED ORDER — ATORVASTATIN CALCIUM 80 MG PO TABS: 80.0000 mg | ORAL_TABLET | Freq: Every day | ORAL | 1 refills | Status: DC

## 2023-02-15 NOTE — Telephone Encounter (Signed)
Received information that pt's insurance will not cover 60 mg dose (1.5 tablets of the 40mg ). If appropriate, can we change to the 80mg  dose?

## 2023-02-15 NOTE — Addendum Note (Signed)
Addended by: Jonah Blue B on: 02/15/2023 01:47 PM   Modules accepted: Orders

## 2023-02-15 NOTE — Telephone Encounter (Signed)
Pharmacy comment: Alternative Requested:PRIOR AUTH PLEASE ADVISE.

## 2023-02-16 NOTE — Telephone Encounter (Signed)
Noted! Thank you

## 2023-02-16 NOTE — Telephone Encounter (Signed)
Spoke with patient know that her insurance will not pay for [place] to change the atorvastatin to 1-1/2 tablets daily.  We would have to increase it from 40 mg daily to 80 mg daily.  An updated prescription has been sent to her pharmacy. Patient voiced understanding of all discussed .

## 2023-03-07 NOTE — Progress Notes (Signed)
S:     No chief complaint on file.  67 y.o. female who presents for hypertension evaluation, education, and management. PMH is significant for HTN, T2DM, aortic atherosclerosis, HLD, former smoker (quit around February 2024). Patient was referred and last seen by Primary Care Provider, Dr. Laural Benes, on 02/08/23 where BP was elevated at 137/77. Patient was last seen by pharmacist Butch Penny, on 07/03/2022.   At last visit, Dr. Laural Benes recommended increasing lisinopril to 40 mg or adding an additional medication however, patient refused stating that she was upset which is why her BP was elevated.   Today, patient arrives in good spirits and presents without any assistance. Denies dizziness, headache, blurred vision, swelling.   During last visit with Franky Macho patient reported hypertension was diagnosed 'long time ago'.   Family/Social history:  CKD, MI/Stroke, no HF, lost son to brain aneurysm Quit smoking earlier this year   Medication adherence: Denies missed doses. Patient reports she took BP medications today.   Current antihypertensives include: Amlodipine 10 mg daily, Lisinopril 30 mg daily  Antihypertensives tried in the past include: Lisinopril-hydrochlorothiazide (stopped due to increased urination)  Reported home BP readings: Patient reports she does not have a BP cuff at home and would like one.   Patient reported dietary habits: Eats 2 meals/day Breakfast: grits, egg, turkey/bacon Dinner: vegetables (uses mrs.dash), no canned food Snacks: fruits (apples, and grapes) Drinks: water, tea  Patient-reported exercise habits: walks in the neighborhood (3 laps)   O:  Vitals:   03/08/23 1051  BP: 111/63  Pulse: 96     Last 3 Office BP readings: BP Readings from Last 3 Encounters:  02/08/23 137/77  12/01/22 (!) 146/75  09/29/22 116/70    BMET    Component Value Date/Time   NA 142 02/08/2023 1159   K 4.1 02/08/2023 1159   CL 103 02/08/2023 1159   CO2 24  02/08/2023 1159   GLUCOSE 173 (H) 02/08/2023 1159   GLUCOSE 125 (H) 11/18/2021 1013   BUN 8 02/08/2023 1159   CREATININE 0.78 02/08/2023 1159   CREATININE 1.11 (H) 04/04/2016 1113   CALCIUM 9.3 02/08/2023 1159   GFRNONAA >60 11/18/2021 1013   GFRNONAA 54 (L) 04/04/2016 1113   GFRAA 91 05/19/2019 1023   GFRAA 62 04/04/2016 1113    Renal function: CrCl cannot be calculated (Patient's most recent lab result is older than the maximum 21 days allowed.).  Clinical ASCVD: No  The 10-year ASCVD risk score (Arnett DK, et al., 2019) is: 42.1%   Values used to calculate the score:     Age: 78 years     Sex: Female     Is Non-Hispanic African American: Yes     Diabetic: Yes     Tobacco smoker: Yes     Systolic Blood Pressure: 137 mmHg     Is BP treated: Yes     HDL Cholesterol: 50 mg/dL     Total Cholesterol: 171 mg/dL  Patient is participating in a [employer]   A/P: Hypertension diagnosed currently at goal on current medications. BP goal < 130/80  mmHg. Medication adherence appears optimal. BP in clinic 111/63 mmHg. -Continued amlodipine 10 mg daily and lisinopril 30 mg daily.  -Prescription for BP cuff was sent to pharmacy.  -Patient educated on purpose, proper use, and potential adverse effects.  -Counseled on lifestyle modifications for blood pressure control including reduced dietary sodium, increased exercise, adequate sleep. -Encouraged patient to check BP at home and bring log of  readings to next visit. Counseled on proper use of home BP cuff.   Results reviewed and written information provided.    Written patient instructions provided. Patient verbalized understanding of treatment plan.  Total time in face to face counseling 30 minutes.    Follow-up:  PCP clinic visit in 06/13/2022.   Patient seen with:  Erasmo Leventhal, PharmD Candidate  [employer] of 2025 [employer] [facility] SOP  Butch Penny, PharmD, [place], CPP Clinical Pharmacist [facility] &  [facility] [phone]

## 2023-03-08 ENCOUNTER — Encounter: Payer: Self-pay | Admitting: Pharmacist

## 2023-03-08 ENCOUNTER — Ambulatory Visit: Payer: 59 | Attending: Internal Medicine | Admitting: Pharmacist

## 2023-03-08 VITALS — BP 111/63 | HR 96

## 2023-03-08 DIAGNOSIS — Z7984 Long term (current) use of oral hypoglycemic drugs: Secondary | ICD-10-CM | POA: Diagnosis not present

## 2023-03-08 DIAGNOSIS — E1159 Type 2 diabetes mellitus with other circulatory complications: Secondary | ICD-10-CM

## 2023-03-08 DIAGNOSIS — I152 Hypertension secondary to endocrine disorders: Secondary | ICD-10-CM

## 2023-03-08 MED ORDER — BLOOD PRESSURE CUFF MISC: 0 refills | Status: AC

## 2023-03-13 ENCOUNTER — Ambulatory Visit: Payer: 59 | Attending: Internal Medicine

## 2023-03-13 DIAGNOSIS — Z23 Encounter for immunization: Secondary | ICD-10-CM | POA: Diagnosis not present

## 2023-03-13 DIAGNOSIS — Z7984 Long term (current) use of oral hypoglycemic drugs: Secondary | ICD-10-CM | POA: Diagnosis not present

## 2023-03-13 DIAGNOSIS — E1169 Type 2 diabetes mellitus with other specified complication: Secondary | ICD-10-CM | POA: Diagnosis not present

## 2023-03-13 NOTE — Addendum Note (Signed)
Addended by: Elsie Lincoln F on: 03/13/2023 03:02 PM   Modules accepted: Level of Service

## 2023-03-13 NOTE — Progress Notes (Cosign Needed Addendum)
Shingles vaccine administered in right deltoid per protocols.  Information sheet given. Patient denies and pain or discomfort at injection site. Tolerated injection well no reaction.

## 2023-03-15 DIAGNOSIS — J029 Acute pharyngitis, unspecified: Secondary | ICD-10-CM | POA: Diagnosis not present

## 2023-03-15 DIAGNOSIS — R0981 Nasal congestion: Secondary | ICD-10-CM | POA: Diagnosis not present

## 2023-03-15 DIAGNOSIS — Z9889 Other specified postprocedural states: Secondary | ICD-10-CM | POA: Diagnosis not present

## 2023-03-15 DIAGNOSIS — E1165 Type 2 diabetes mellitus with hyperglycemia: Secondary | ICD-10-CM | POA: Diagnosis not present

## 2023-03-15 DIAGNOSIS — R0602 Shortness of breath: Secondary | ICD-10-CM | POA: Diagnosis not present

## 2023-03-15 DIAGNOSIS — Z20822 Contact with and (suspected) exposure to covid-19: Secondary | ICD-10-CM | POA: Diagnosis not present

## 2023-03-15 DIAGNOSIS — R052 Subacute cough: Secondary | ICD-10-CM | POA: Diagnosis not present

## 2023-03-15 DIAGNOSIS — R490 Dysphonia: Secondary | ICD-10-CM | POA: Diagnosis not present

## 2023-03-15 DIAGNOSIS — F17210 Nicotine dependence, cigarettes, uncomplicated: Secondary | ICD-10-CM | POA: Diagnosis not present

## 2023-04-20 DIAGNOSIS — R053 Chronic cough: Secondary | ICD-10-CM | POA: Diagnosis not present

## 2023-04-20 DIAGNOSIS — R490 Dysphonia: Secondary | ICD-10-CM | POA: Diagnosis not present

## 2023-04-20 DIAGNOSIS — J381 Polyp of vocal cord and larynx: Secondary | ICD-10-CM | POA: Diagnosis not present

## 2023-05-16 ENCOUNTER — Other Ambulatory Visit: Payer: Self-pay | Admitting: Internal Medicine

## 2023-05-16 DIAGNOSIS — I152 Hypertension secondary to endocrine disorders: Secondary | ICD-10-CM

## 2023-05-26 ENCOUNTER — Ambulatory Visit (HOSPITAL_COMMUNITY)
Admission: EM | Admit: 2023-05-26 | Discharge: 2023-05-26 | Disposition: A | Discharge status: Home / Self Care | Payer: 59 | Attending: Emergency Medicine | Admitting: Emergency Medicine

## 2023-05-26 ENCOUNTER — Encounter (HOSPITAL_COMMUNITY): Payer: Self-pay | Admitting: Emergency Medicine

## 2023-05-26 DIAGNOSIS — M25561 Pain in right knee: Secondary | ICD-10-CM | POA: Diagnosis not present

## 2023-05-26 NOTE — ED Provider Notes (Signed)
[facility]    CSN: [id number] Arrival date & time: 05/26/23  1242      History   Chief Complaint Chief Complaint  Patient presents with   Knee Pain    HPI Stephanie Neal is a 68 y.o. female.  Right knee pain for about a week Feels it has been worse with the cold weather Denies injury, trauma, fall.  No prior injury known. Feels worse when she tries to go up and down the stairs She tried topical IcyHot and Tiger balm   In chart review, history of chronic right knee pain, intermittent flares  Past Medical History:  Diagnosis Date   Diabetes mellitus    Hyperlipidemia    Hypertension     Patient Active Problem List   Diagnosis Date Noted   Aortic atherosclerosis (HCC) 05/05/2021   Tetanus, diphtheria, and acellular pertussis (Tdap) vaccination declined 05/10/2020   Hyperlipidemia associated with type 2 diabetes mellitus (HCC) 11/18/2019   Chronic pain of right knee 11/18/2019   Mixed hyperlipidemia 06/25/2018   Over weight 06/25/2018   Stress due to family tension 11/13/2017   DM type 2 (diabetes mellitus, type 2) (HCC) 03/05/2014   HYPERCHOLESTEROLEMIA 03/12/2007   OBESITY 03/12/2007   Tobacco dependence 03/12/2007   HTN (hypertension) 03/12/2007   ALLERGIC RHINITIS, SEASONAL 03/12/2007    Past Surgical History:  Procedure Laterality Date   FOOT SURGERY     MICROLARYNGOSCOPY N/A 01/18/2022   Procedure: MICROLARYNGOSCOPY WITH EXCISION OF POLYPS;  Surgeon: Serena Colonel, MD;  Location: [facility] OR;  Service: ENT;  Laterality: N/A;    OB History   No obstetric history on file.      Home Medications    Prior to Admission medications   Medication Sig Start Date End Date Taking? Authorizing Provider  albuterol (VENTOLIN HFA) 108 (90 Base) MCG/ACT inhaler Inhale 2 puffs into the lungs every 4 (four) hours as needed for wheezing or shortness of breath. 12/01/22   Zenia Resides, MD  amLODipine (NORVASC) 10 MG tablet Take 1 tablet (10 mg  total) by mouth daily. 09/29/22   Marcine Matar, MD  atorvastatin (LIPITOR) 80 MG tablet Take 1 tablet (80 mg total) by mouth daily. 02/15/23   Marcine Matar, MD  Blood Glucose Monitoring Suppl (ACCU-CHEK GUIDE ME) w/Device KIT Use as directed 05/05/21   Marcine Matar, MD  Blood Pressure Monitoring (BLOOD PRESSURE CUFF) MISC Use to check blood pressure once daily. 03/08/23   Marcine Matar, MD  doxycycline (VIBRA-TABS) 100 MG tablet Take 1 tablet (100 mg total) by mouth 2 (two) times daily. 02/08/23   Marcine Matar, MD  fluticasone (FLONASE) 50 MCG/ACT nasal spray Place 1 spray into both nostrils daily. 02/08/23   Marcine Matar, MD  glimepiride (AMARYL) 4 MG tablet 1 tab PO twice a day 02/08/23   Marcine Matar, MD  glucose blood (ACCU-CHEK GUIDE) test strip Use as instructed 05/05/21   Marcine Matar, MD  Lancets Misc. (ACCU-CHEK FASTCLIX LANCET) KIT Use as directed 05/05/21   Marcine Matar, MD  lidocaine (LIDODERM) 5 % PLACE 1 PATCH ONTO THE SKIN DAILY. REMOVE & DISCARD PATCH WITHIN 12 HOURS OR AS DIRECTED BY MD 05/30/22   Marcine Matar, MD  lisinopril (ZESTRIL) 30 MG tablet TAKE 1 TABLET BY MOUTH EVERY DAY 05/16/23   Marcine Matar, MD  metFORMIN (GLUCOPHAGE) 1000 MG tablet TAKE 1 TABLET BY MOUTH 2 (TWO) TIMES DAILY WITH A MEAL. FOR DIABETES 01/02/23  Marcine Matar, MD    Family History Family History  Problem Relation Age of Onset   Hypertension Mother    Diabetes Mother    COPD Father    Heart disease Father    Diabetes Father     Social History Social History   Tobacco Use   Smoking status: Former   Smokeless tobacco: Never  Vaping Use   Vaping status: Never Used  Substance Use Topics   Alcohol use: No   Drug use: No     Allergies   Penicillins   Review of Systems Review of Systems Per HPI  Physical Exam Triage Vital Signs ED Triage Vitals  Encounter Vitals Group     BP 05/26/23 1359 (!) 145/79     Systolic BP  Percentile --      Diastolic BP Percentile --      Pulse Rate 05/26/23 1359 95     Resp 05/26/23 1359 20     Temp 05/26/23 1359 (!) 97.5 F (36.4 C)     Temp Source 05/26/23 1359 Oral     SpO2 05/26/23 1359 99 %     Weight --      Height --      Head Circumference --      Peak Flow --      Pain Score 05/26/23 1358 4     Pain Loc --      Pain Education --      Exclude from Growth Chart --    No data found.  Updated Vital Signs BP (!) 145/79 (BP Location: Left Arm)   Pulse 95   Temp (!) 97.5 F (36.4 C) (Oral)   Resp 20   SpO2 99%   Physical Exam Vitals and nursing note reviewed.  Constitutional:      General: She is not in acute distress. Cardiovascular:     Rate and Rhythm: Normal rate and regular rhythm.     Pulses: Normal pulses.     Heart sounds: Normal heart sounds.  Pulmonary:     Effort: Pulmonary effort is normal.     Breath sounds: Normal breath sounds.  Musculoskeletal:        General: Deformity (arthritic) present. No swelling, tenderness or signs of injury. Normal range of motion.     Cervical back: Normal range of motion.     Right lower leg: No edema.     Comments: Distal sensation intact. Strong DP pulse. Good ROM of knee. No bony tenderness. There may be some infrapatellar bursitis  Skin:    General: Skin is warm and dry.  Neurological:     Mental Status: She is alert and oriented to person, place, and time.     UC Treatments / Results  Labs (all labs ordered are listed, but only abnormal results are displayed) Labs Reviewed - No data to display  EKG   Radiology No results found.  Procedures Procedures (including critical care time)  Medications Ordered in UC Medications - No data to display  Initial Impression / Assessment and Plan / UC Course  I have reviewed the triage vital signs and the nursing notes.  Pertinent labs & imaging results that were available during my care of the patient were reviewed by me and considered in my  medical decision making (see chart for details).  Offered x-ray, discussed likely arthritic and degenerative changes would be noted.  Likely also has soft tissue changes, potentially meniscus. Patient would like to defer x-ray today.  Also  offered IM Toradol; patient reports she does not do well with shots.  Discussed oral and topical pain control at home, RICE therapy.  Knee brace provided.  Recommend ice and elevate.  Two orthopedic clinics provided for follow-up. Patient agrees to plan, all questions answered   Final Clinical Impressions(s) / UC Diagnoses   Final diagnoses:  Acute pain of right knee     Discharge Instructions      Rest - try to avoid heavy lifting and high impact activity Ice - apply for 20 minutes a few times daily Compression - use knee brace for support Elevation - prop up on a pillow  You can go to [facility] on Monday -- they have walk-in hours Otherwise call Delbert Harness to make an appointment  Continue topical treatments if helpful. You can also use tylenol or ibuprofen      ED Prescriptions   None    PDMP not reviewed this encounter.   Verdelle Valtierra, Lurena Joiner, [place] 05/26/23 1507

## 2023-05-26 NOTE — Discharge Instructions (Signed)
Rest - try to avoid heavy lifting and high impact activity Ice - apply for 20 minutes a few times daily Compression - use knee brace for support Elevation - prop up on a pillow  You can go to [facility] on Monday -- they have walk-in hours Otherwise call Delbert Harness to make an appointment  Continue topical treatments if helpful. You can also use tylenol or ibuprofen

## 2023-05-26 NOTE — ED Triage Notes (Signed)
Pt reports last week right knee started hurting when having to go up her stairs. Reports was swollen and trying to keep elevated. Reports sometimes can feel it pop. Denies injury.

## 2023-06-10 ENCOUNTER — Other Ambulatory Visit: Payer: Self-pay | Admitting: Internal Medicine

## 2023-06-10 DIAGNOSIS — E1169 Type 2 diabetes mellitus with other specified complication: Secondary | ICD-10-CM

## 2023-06-11 NOTE — Telephone Encounter (Signed)
 Request too soon, last ordered 02/08/23 #270, 1 refill.  Requested Prescriptions  Pending Prescriptions Disp Refills   glimepiride  (AMARYL ) 2 MG tablet [Pharmacy Med Name: GLIMEPIRIDE  2 MG TABLET] 270 tablet 1    Sig: TAKE 2 TABS (4 MG) IN THE MORNING AND 1 TABLET (2 MG) IN THE AFTERNOON.     Endocrinology:  Diabetes - Sulfonylureas Passed - 06/11/2023  1:54 PM      Passed - HBA1C is between 0 and 7.9 and within 180 days    HbA1c, POC (controlled diabetic range)  Date Value Ref Range Status  02/08/2023 7.8 (A) 0.0 - 7.0 % Final         Passed - Cr in normal range and within 360 days    Creat  Date Value Ref Range Status  04/04/2016 1.11 (H) 0.50 - 0.99 mg/dL Final    Comment:      For patients > or = 69 years of age: The upper reference limit for Creatinine is approximately 13% higher for people identified as African-American.      Creatinine, Ser  Date Value Ref Range Status  02/08/2023 0.78 0.57 - 1.00 mg/dL Final   Creatinine, Urine  Date Value Ref Range Status  04/04/2016 43 20 - 320 mg/dL Final         Passed - Valid encounter within last 6 months    Recent Outpatient Visits           4 months ago Type 2 diabetes mellitus with hyperlipidemia (HCC)   [facility] [facility] Dept Of [place]. [facility] Concetta Dee B, MD   8 months ago Type 2 diabetes mellitus with hyperlipidemia [facility])   [place] [facility] Grout - A Dept Of [place]. [facility] Lawrance Presume, MD   11 months ago Essential hypertension   [place] [facility] [place] - A Dept Of [place]. [facility] Valente Gaskin, RPH-CPP   1 year ago Need for vaccination for Strep pneumoniae   [place] [facility] - A Dept Of [place]. [facility] Verdel Gitelman L, RPH-CPP   1 year ago Type 2 diabetes mellitus with hyperlipidemia [facility])   [place] [facility] - A Dept Of [place]. [facility]  Lawrance Presume, MD       Future Appointments             In 3 days Lawrance Presume, MD [facility] [place] - A Dept Of [place]ag[place]. [facility]

## 2023-06-12 ENCOUNTER — Other Ambulatory Visit: Payer: Self-pay | Admitting: Internal Medicine

## 2023-06-12 DIAGNOSIS — E1169 Type 2 diabetes mellitus with other specified complication: Secondary | ICD-10-CM

## 2023-06-12 NOTE — Telephone Encounter (Signed)
Requested by interface surescripts. Last labs 02/08/23 . Future visit in 2 days.  Requested Prescriptions  Pending Prescriptions Disp Refills   atorvastatin (LIPITOR) 80 MG tablet [Pharmacy Med Name: ATORVASTATIN 80 MG TABLET] 90 tablet 1    Sig: TAKE 1 TABLET BY MOUTH EVERY DAY     Cardiovascular:  Antilipid - Statins Failed - 06/12/2023 10:35 AM      Failed - Lipid Panel in normal range within the last 12 months    Cholesterol, Total  Date Value Ref Range Status  02/08/2023 171 100 - 199 mg/dL Final   LDL Chol Calc (NIH)  Date Value Ref Range Status  02/08/2023 101 (H) 0 - 99 mg/dL Final   HDL  Date Value Ref Range Status  02/08/2023 50 >39 mg/dL Final   Triglycerides  Date Value Ref Range Status  02/08/2023 113 0 - 149 mg/dL Final         Passed - Patient is not pregnant      Passed - Valid encounter within last 12 months    Recent Outpatient Visits           4 months ago Type 2 diabetes mellitus with hyperlipidemia (HCC)   [place] [facility] - [facility] Dept Of [place]. [facility] Jonah Blue B, MD   8 months ago Type 2 diabetes mellitus with hyperlipidemia [facility])   [place] [facility] - A Dept Of [place]. [facility] Marcine Matar, MD   11 months ago Essential hypertension   [place] [facility] [place] - A Dept Of [place]. [facility] Drucilla Chalet, RPH-CPP   1 year ago Need for vaccination for Strep pneumoniae   [place] [facility] - A Dept Of [place]. [facility] Yehuda Savannah L, RPH-CPP   1 year ago Type 2 diabetes mellitus with hyperlipidemia [facility])   [place] [facility] - A Dept Of [place]. [facility] Marcine Matar, MD       Future Appointments             In 2 days Marcine Matar, MD [facility] [place] - A Dept Of Eligha Bridegroom. [facility]

## 2023-06-14 ENCOUNTER — Encounter: Payer: Self-pay | Admitting: Internal Medicine

## 2023-06-14 ENCOUNTER — Ambulatory Visit: Payer: 59 | Attending: Internal Medicine | Admitting: Internal Medicine

## 2023-06-14 VITALS — BP 129/73 | HR 86 | Temp 97.8°F | Ht 72.0 in | Wt 181.0 lb

## 2023-06-14 DIAGNOSIS — Z23 Encounter for immunization: Secondary | ICD-10-CM

## 2023-06-14 DIAGNOSIS — Z7984 Long term (current) use of oral hypoglycemic drugs: Secondary | ICD-10-CM | POA: Diagnosis not present

## 2023-06-14 DIAGNOSIS — Z87891 Personal history of nicotine dependence: Secondary | ICD-10-CM | POA: Diagnosis not present

## 2023-06-14 DIAGNOSIS — R490 Dysphonia: Secondary | ICD-10-CM

## 2023-06-14 DIAGNOSIS — I10 Essential (primary) hypertension: Secondary | ICD-10-CM

## 2023-06-14 DIAGNOSIS — E1169 Type 2 diabetes mellitus with other specified complication: Secondary | ICD-10-CM | POA: Diagnosis not present

## 2023-06-14 DIAGNOSIS — E119 Type 2 diabetes mellitus without complications: Secondary | ICD-10-CM

## 2023-06-14 DIAGNOSIS — E785 Hyperlipidemia, unspecified: Secondary | ICD-10-CM | POA: Diagnosis not present

## 2023-06-14 DIAGNOSIS — I152 Hypertension secondary to endocrine disorders: Secondary | ICD-10-CM

## 2023-06-14 LAB — POCT GLYCOSYLATED HEMOGLOBIN (HGB A1C): HbA1c, POC (controlled diabetic range): 7.9 % — AB (ref 0.0–7.0)

## 2023-06-14 LAB — GLUCOSE, POCT (MANUAL RESULT ENTRY): POC Glucose: 176 mg/dL — AB (ref 70–99)

## 2023-06-14 MED ORDER — ACCU-CHEK FASTCLIX LANCET KIT: PACK | 6 refills | Status: DC

## 2023-06-14 MED ORDER — EMPAGLIFLOZIN 10 MG PO TABS: 10.0000 mg | ORAL_TABLET | Freq: Every day | ORAL | 5 refills | Status: DC

## 2023-06-14 MED ORDER — ZOSTER VAC RECOMB ADJUVANTED 50 MCG/0.5ML IM SUSR: 0.5000 mL | Freq: Once | INTRAMUSCULAR | 0 refills | Status: AC

## 2023-06-14 MED ORDER — ACCU-CHEK GUIDE ME W/DEVICE KIT: PACK | 0 refills | Status: AC

## 2023-06-14 NOTE — Progress Notes (Signed)
Patient ID: Stephanie Neal, female    DOB: [birth year]  MRN: [id number]  CC: Diabetes (DM & HTN f/u. [facility] visit - pt instructed that throat is ok. Pt reports that throat is slowly feeling better/Discuss shingles vax)   Subjective: Stephanie Neal is a 68 y.o. female who presents for chronic ds management. Her concerns today include:  Hx of DM, HTN, former smoker, obesity, HL, vocal polyp removed 12/2021.   Discussed the use of AI scribe software for clinical note transcription with the patient, who gave verbal consent to proceed.  History of Present Illness   The patient, with a history of smoking, presents with hoarseness. She sought a second opinion at [facility], where she was told her throat was 'beautiful' but dry. Also seen were changes on the vocal cords due to smoking.  She was advised to increase hydration, use a humidifier, stay away from tobacco use, avoid caffeine and use throat lozenges. The patient has quit smoking, which has been challenging, especially as her partner still smokes.  The patient also has hypertension and is taking amlodipine 10 and lisinopril 30 mg, which are well-tolerated. She also has hyperlipidemia and is taking and tolerating atorvastatin.   DM: Results for orders placed or performed in visit on 06/14/23  POCT glucose (manual entry)   Collection Time: 06/14/23 12:05 PM  Result Value Ref Range   POC Glucose 176 (A) 70 - 99 mg/dl  POCT glycosylated hemoglobin (Hb A1C)   Collection Time: 06/14/23 12:13 PM  Result Value Ref Range   Hemoglobin A1C     HbA1c POC (<> result, manual entry)     HbA1c, POC (prediabetic range)     HbA1c, POC (controlled diabetic range) 7.9 (A) 0.0 - 7.0 %  The patient has diabetes, with an A1c of 7.9, which has increased from 7.8. She is taking glimepiride 4mg  twice daily and metformin 1000mg  twice daily. The patient is not currently checking blood sugars due to a non-functioning machine. She reports good dietary  habits, including increased fruit and vegetable intake and decreased meat consumption.     Patient Active Problem List   Diagnosis Date Noted   Aortic atherosclerosis (HCC) 05/05/2021   Tetanus, diphtheria, and acellular pertussis (Tdap) vaccination declined 05/10/2020   Hyperlipidemia associated with type 2 diabetes mellitus (HCC) 11/18/2019   Chronic pain of right knee 11/18/2019   Mixed hyperlipidemia 06/25/2018   Over weight 06/25/2018   Stress due to family tension 11/13/2017   DM type 2 (diabetes mellitus, type 2) (HCC) 03/05/2014   HYPERCHOLESTEROLEMIA 03/12/2007   OBESITY 03/12/2007   Tobacco dependence 03/12/2007   HTN (hypertension) 03/12/2007   ALLERGIC RHINITIS, SEASONAL 03/12/2007     Current Outpatient Medications on File Prior to Visit  Medication Sig Dispense Refill   albuterol (VENTOLIN HFA) 108 (90 Base) MCG/ACT inhaler Inhale 2 puffs into the lungs every 4 (four) hours as needed for wheezing or shortness of breath. 1 each 0   amLODipine (NORVASC) 10 MG tablet Take 1 tablet (10 mg total) by mouth daily. 90 tablet 1   atorvastatin (LIPITOR) 80 MG tablet TAKE 1 TABLET BY MOUTH EVERY DAY 90 tablet 1   Blood Pressure Monitoring (BLOOD PRESSURE CUFF) MISC Use to check blood pressure once daily. 1 each 0   fluticasone (FLONASE) 50 MCG/ACT nasal spray Place 1 spray into both nostrils daily. 16 g 6   glimepiride (AMARYL) 4 MG tablet 1 tab PO twice a day 180 tablet 1   lisinopril (  ZESTRIL) 30 MG tablet TAKE 1 TABLET BY MOUTH EVERY DAY 90 tablet 0   metFORMIN (GLUCOPHAGE) 1000 MG tablet TAKE 1 TABLET BY MOUTH 2 (TWO) TIMES DAILY WITH A MEAL. FOR DIABETES 180 tablet 0   glucose blood (ACCU-CHEK GUIDE) test strip Use as instructed (Patient not taking: Reported on 06/14/2023) 100 each 12   lidocaine (LIDODERM) 5 % PLACE 1 PATCH ONTO THE SKIN DAILY. REMOVE & DISCARD PATCH WITHIN 12 HOURS OR AS DIRECTED BY MD (Patient not taking: Reported on 06/14/2023) 30 patch 0   No current  facility-administered medications on file prior to visit.    Allergies  Allergen Reactions   Penicillins Other (See Comments)    "blanked out"  Has patient had a PCN reaction causing immediate rash, facial/tongue/throat swelling, SOB or lightheadedness with hypotension: No  Has patient had a PCN reaction causing severe rash involving mucus membranes or skin necrosis: No  Has patient had a PCN reaction that required hospitalization No  Has patient had a PCN reaction occurring within the last 10 years: No  If all of the above answers are "NO", then may proceed with Cephalosporin use.  Other Reaction(s): Dizziness    Social History   Socioeconomic History   Marital status: Single    Spouse name: Not on file   Number of children: Not on file   Years of education: Not on file   Highest education level: Not on file  Occupational History   Not on file  Tobacco Use   Smoking status: Former   Smokeless tobacco: Never  Vaping Use   Vaping status: Never Used  Substance and Sexual Activity   Alcohol use: No   Drug use: No   Sexual activity: Not on file  Other Topics Concern   Not on file  Social History Narrative   Not on file   Social Drivers of Health   Financial Resource Strain: Low Risk  (02/08/2023)   Overall Financial Resource Strain (CARDIA)    Difficulty of Paying Living Expenses: Not hard at all  Food Insecurity: No Food Insecurity (02/08/2023)   Hunger Vital Sign    Worried About Running Out of Food in the Last Year: Never true    Ran Out of Food in the Last Year: Never true  Transportation Needs: No Transportation Needs (02/08/2023)   PRAPARE - Administrator, Civil Service (Medical): No    Lack of Transportation (Non-Medical): No  Physical Activity: Insufficiently Active (02/08/2023)   Exercise Vital Sign    Days of Exercise per Week: 2 days    Minutes of Exercise per Session: 30 min  Stress: No Stress Concern Present (02/08/2023)   [employer] of Occupational Health - Occupational Stress Questionnaire    Feeling of Stress : Not at all  Social Connections: Moderately Integrated (05/26/2022)   Social Connection and Isolation Panel [NHANES]    Frequency of Communication with Friends and Family: Twice a week    Frequency of Social Gatherings with Friends and Family: Once a week    Attends Religious Services: 1 to 4 times per year    Active Member of [employer] or Organizations: Yes    Attends Banker Meetings: 1 to 4 times per year    Marital Status: Widowed  Intimate Partner Violence: Not At Risk (02/08/2023)   Humiliation, Afraid, Rape, and Kick questionnaire    Fear of Current or Ex-Partner: No    Emotionally Abused: No    Physically Abused: No  Sexually Abused: No    Family History  Problem Relation Age of Onset   Hypertension Mother    Diabetes Mother    COPD Father    Heart disease Father    Diabetes Father     Past Surgical History:  Procedure Laterality Date   FOOT SURGERY     MICROLARYNGOSCOPY N/A 01/18/2022   Procedure: MICROLARYNGOSCOPY WITH EXCISION OF POLYPS;  Surgeon: Serena Colonel, MD;  Location: [facility] OR;  Service: ENT;  Laterality: N/A;    ROS: Review of Systems Negative except as stated above  PHYSICAL EXAM: BP 129/73 (BP Location: Left Arm, Patient Position: Sitting, Cuff Size: Normal)   Pulse 86   Temp 97.8 F (36.6 C) (Oral)   Ht 6' (1.829 m)   Wt 181 lb (82.1 kg)   SpO2 96%   BMI 24.55 kg/m   Physical Exam  General appearance - alert, well appearing, and in no distress.  Patient's voice remains raspy and hoarse. Neck - supple, no significant adenopathy Chest - clear to auscultation, no wheezes, rales or rhonchi, symmetric air entry Heart - normal rate, regular rhythm, normal S1, S2, no murmurs, rubs, clicks or gallops Extremities - peripheral pulses normal, no pedal edema, no clubbing or cyanosis   Results for orders placed or performed in visit on 06/14/23  POCT  glucose (manual entry)   Collection Time: 06/14/23 12:05 PM  Result Value Ref Range   POC Glucose 176 (A) 70 - 99 mg/dl  POCT glycosylated hemoglobin (Hb A1C)   Collection Time: 06/14/23 12:13 PM  Result Value Ref Range   Hemoglobin A1C     HbA1c POC (<> result, manual entry)     HbA1c, POC (prediabetic range)     HbA1c, POC (controlled diabetic range) 7.9 (A) 0.0 - 7.0 %       Latest Ref Rng & Units 02/08/2023   11:59 AM 12/21/2021   11:05 AM 11/18/2021   10:13 AM  CMP  Glucose 70 - 99 mg/dL 865  784  696   BUN 8 - 27 mg/dL 8  9  10    Creatinine 0.57 - 1.00 mg/dL 2.95  2.84  1.32   Sodium 134 - 144 mmol/L 142  141  140   Potassium 3.5 - 5.2 mmol/L 4.1  4.1  4.1   Chloride 96 - 106 mmol/L 103  103  106   CO2 20 - 29 mmol/L 24  25  25    Calcium 8.7 - 10.3 mg/dL 9.3  9.3  9.0   Total Protein 6.0 - 8.5 g/dL 6.4  6.2    Total Bilirubin 0.0 - 1.2 mg/dL <4.4  0.3    Alkaline Phos 44 - 121 IU/L 77  58    AST 0 - 40 IU/L 15  14    ALT 0 - 32 IU/L 9  10     Lipid Panel     Component Value Date/Time   CHOL 171 02/08/2023 1159   TRIG 113 02/08/2023 1159   HDL 50 02/08/2023 1159   CHOLHDL 3.4 02/08/2023 1159   CHOLHDL 5.6 (H) 06/09/2015 1023   VLDL 22 06/09/2015 1023   LDLCALC 101 (H) 02/08/2023 1159    CBC    Component Value Date/Time   WBC 4.2 02/08/2023 1159   WBC 4.9 11/18/2021 1013   RBC 4.97 02/08/2023 1159   RBC 5.13 (H) 11/18/2021 1013   HGB 13.6 02/08/2023 1159   HCT 40.9 02/08/2023 1159   PLT 240 02/08/2023 1159  MCV 82 02/08/2023 1159   MCH 27.4 02/08/2023 1159   MCH 27.5 11/18/2021 1013   MCHC 33.3 02/08/2023 1159   MCHC 33.9 11/18/2021 1013   RDW 14.4 02/08/2023 1159   LYMPHSABS 1.2 12/21/2021 1105   MONOABS 0.5 11/18/2021 1013   EOSABS 0.1 12/21/2021 1105   BASOSABS 0.0 12/21/2021 1105    ASSESSMENT AND PLAN: 1. Type 2 diabetes mellitus without complication, without long-term current use of insulin (HCC) (Primary) Not at goal.  Continue healthy  eating habits and trying to move as much as she can. Add Jardiance 10 mg once daily, with instructions to ensure adequate hydration while on this medication and to expect more frequent urination.  Advised to hold the medication if ever she is dealing with any acute gastrointestinal issues that causes vomiting or diarrhea until it resolves.  Advised that the medicine can sometimes cause frequent UTIs/vaginal yeast infection.  If she start experiencing this she should let me know as the medication will need to be discontinued.   -[facility]end prescription for [facility]Check[facility] test [facility] [address] -Advise patient to check blood sugars once daily, ideally in the morning before breakfast. - POCT glycosylated hemoglobin (Hb A1C) - POCT glucose (manual entry) - Lancets Misc. (ACCU-CHEK FASTCLIX LANCET) KIT; Use as directed to check blood sugars once a day  Dispense: 100 kit; Refill: 6 - Blood Glucose Monitoring Suppl (ACCU-CHEK GUIDE ME) w/Device KIT; Use as directed  Dispense: 1 kit; Refill: 0 - empagliflozin (JARDIANCE) 10 MG TABS tablet; Take 1 tablet (10 mg total) by mouth daily before breakfast.  Dispense: 30 tablet; Refill: 5 - Ambulatory referral to Ophthalmology  2. Hypertension associated with diabetes (HCC) At goal.  Continue amlodipine 10 mg daily and lisinopril 30 mg daily.  3. Hyperlipidemia associated with type 2 diabetes mellitus (HCC) Continue atorvastatin 80 mg daily  4. Hoarseness of voice She is seen ENT at [facility].  Encouraged her to follow their recommendation  5. Former smoker Encouraged her to remain tobacco free  6. Need for shingles vaccine - Zoster Vaccine Adjuvanted [facility]) injection; Inject 0.5 mLs into the muscle once for 1 dose.  Dispense: 0.5 mL; Refill: 0  General Health Maintenance -Reschedule diabetic eye exam with [facility]. -Provide prescription for second shingles vaccine to be filled and administered at patient's preferred  pharmacy. -Schedule Medicare wellness visit to be conducted over the phone.       Patient was given the opportunity to ask questions.  Patient verbalized understanding of the plan and was able to repeat key elements of the plan.   This documentation was completed using Paediatric nurse.  Any transcriptional errors are unintentional.  Orders Placed This Encounter  Procedures   Ambulatory referral to Ophthalmology   POCT glycosylated hemoglobin (Hb A1C)   POCT glucose (manual entry)     Requested Prescriptions   Signed Prescriptions Disp Refills   Lancets Misc. (ACCU-CHEK FASTCLIX LANCET) KIT 100 kit 6    Sig: Use as directed to check blood sugars once a day   Blood Glucose Monitoring Suppl (ACCU-CHEK GUIDE ME) w/Device KIT 1 kit 0    Sig: Use as directed   empagliflozin (JARDIANCE) 10 MG TABS tablet 30 tablet 5    Sig: Take 1 tablet (10 mg total) by mouth daily before breakfast.   Zoster Vaccine Adjuvanted (SHINGRIX) injection 0.5 mL 0    Sig: Inject 0.5 mLs into the muscle once for 1 dose.    Return in  about 4 months (around 10/12/2023) for Medicare Wellness Visit in  1-2  wks with CMA.  Jonah Blue, MD, FACP

## 2023-06-14 NOTE — Patient Instructions (Signed)
Your diabetes is not at goal.  We have added another diabetic medication called Jardiance 10 mg daily.  This medication will cause you to urinate more frequently so make sure that you are drinking at least 4 to 8 glasses of water daily.  Please let me know if you develop any frequent vaginal yeast infection or urinary tract infection.

## 2023-06-19 ENCOUNTER — Other Ambulatory Visit: Payer: Self-pay | Admitting: Internal Medicine

## 2023-06-19 DIAGNOSIS — E119 Type 2 diabetes mellitus without complications: Secondary | ICD-10-CM

## 2023-06-19 NOTE — Telephone Encounter (Signed)
Request too soon last ordered 06/14/23 with refills. Requested Prescriptions  Pending Prescriptions Disp Refills   Lancets Misc. (ACCU-CHEK FASTCLIX LANCET) KIT 100 kit 6    Sig: Use as directed to check blood sugars once a day     Endocrinology: Diabetes - Testing Supplies Passed - 06/19/2023  2:52 PM      Passed - Valid encounter within last 12 months    Recent Outpatient Visits           5 days ago Type 2 diabetes mellitus without complication, without long-term current use of insulin (HCC)   [facility] Dept [facility] [place]. [facility] Jonah Blue B, MD   4 months ago Type 2 diabetes mellitus with hyperlipidemia [facility])   [facility] [place] [facility] - A Dept Of [place]. [facility] Jonah Blue B, MD   8 months ago Type 2 diabetes mellitus with hyperlipidemia [facility])   [facility] [facility] - A Dept Of [place]. [facility] Marcine Matar, MD   11 months ago Essential hypertension   [facility] [place] - A Dept Of [place]. [facility] Drucilla Chalet, RPH-CPP   1 year ago Need for vaccination for Strep pneumoniae   [facility] - A Dept Of [place]. [facility] Drucilla Chalet, RPH-CPP

## 2023-06-19 NOTE — Telephone Encounter (Signed)
Copied from CRM [id number]. Topic: Clinical - Medication Refill >> Jun 19, 2023 11:18 AM Patsy Lager T wrote: Most Recent Primary Care Visit:  Provider: Jonah Blue B  Department: [facility]  Visit Type: OFFICE VISIT  Date: 06/14/2023  Medication: Lancets Misc. (ACCU-CHEK FASTCLIX LANCET) KIT and glucose blood (ACCU-CHEK GUIDE) test strip   Has the patient contacted their pharmacy? No  Is this the correct pharmacy for this prescription? Yes  This is the patient's preferred pharmacy:  [id number] [place] [place] - [address] AT [facility] [address] [place] [address] Phone: [phone] Fax: [phone]   Has the prescription been filled recently? Yes  Is the patient out of the medication? Yes  Has the patient been seen for an appointment in the last year OR does the patient have an upcoming appointment? Yes  Can we respond through MyChart? Yes  Agent: Please be advised that Rx refills may take up to 3 business days. We ask that you follow-up with your pharmacy.  Patient last time checking sugar was yesterday

## 2023-06-20 ENCOUNTER — Telehealth: Payer: Self-pay | Admitting: Internal Medicine

## 2023-06-20 ENCOUNTER — Other Ambulatory Visit: Payer: Self-pay | Admitting: Internal Medicine

## 2023-06-20 DIAGNOSIS — E119 Type 2 diabetes mellitus without complications: Secondary | ICD-10-CM

## 2023-06-20 DIAGNOSIS — E1169 Type 2 diabetes mellitus with other specified complication: Secondary | ICD-10-CM

## 2023-06-20 MED ORDER — ACCU-CHEK GUIDE TEST VI STRP: ORAL_STRIP | 12 refills | Status: DC

## 2023-06-20 MED ORDER — ACCU-CHEK SOFTCLIX LANCETS MISC: 12 refills | Status: DC

## 2023-06-20 NOTE — Telephone Encounter (Signed)
Call placed to pt.   Pt tells me that she picked up rxn for Accu-chek Guide Me but the Accu-Chek FastClix Lancet does not match the glucometer.  Told by pharmacist that she needs the one that says Soft Lancet.   Also request rxn be sent for the test stripes as one was not sent. I apologized that the Lancet did not match the kit.  Will have our clinical pharmacist try to figure out which one is needed. [facility]  I request that she check to see if the fast click lancets is or is not compatible with the Accu-Chek guide me.  She states that the fast quick and the soft click should be compatible.

## 2023-06-20 NOTE — Telephone Encounter (Signed)
Copied from CRM [id number]. Topic: Clinical - Medication Refill >> Jun 20, 2023  5:07 PM Thomes Dinning wrote: Most Recent Primary Care Visit:  Provider: Jonah Blue B  Department[facility]  Visit Type: OFFICE VISIT  Date: 06/14/2023  Medication:glucose blood (ACCU-CHEK GUIDE) test strip   Has the patient contacted their pharmacy? Yes (Agent: If no, request that the patient contact the pharmacy for the refill. If patient does not wish to contact the pharmacy document the reason why and proceed with request.) (Agent: If yes, when and what did the pharmacy advise?)  Is this the correct pharmacy for this prescription? Yes If no, delete pharmacy and type the correct one.  This is the patient's preferred pharmacy:  [id number] [place], [place] - [address] AT [facility] [address] [place] [address] Phone: [phone] Fax: [phone]   Has the prescription been filled recently? No  Is the patient out of the medication? Yes  Has the patient been seen for an appointment in the last year OR does the patient have an upcoming appointment? Yes  Can we respond through MyChart? Yes  Agent: Please be advised that Rx refills may take up to 3 business days. We ask that you follow-up with your pharmacy.

## 2023-06-20 NOTE — Telephone Encounter (Signed)
[facility] with glucose blood (ACCU-CHEK GUIDE) test strips.Unable to reach anyone VM left .

## 2023-06-20 NOTE — Telephone Encounter (Signed)
Copied from CRM [id number]. Topic: Clinical - Prescription Issue >> Jun 20, 2023  5:17 PM Alvino Blood C wrote: Reason for CRM: Patient states the incorrect medication was sent to the pharmacy. Patient states she needs the glucose blood (ACCU-CHEK GUIDE) test strips instead.

## 2023-06-20 NOTE — Addendum Note (Signed)
Addended by: Jonah Blue B on: 06/20/2023 06:33 PM   Modules accepted: Orders

## 2023-06-21 ENCOUNTER — Other Ambulatory Visit: Payer: Self-pay | Admitting: Pharmacist

## 2023-06-21 ENCOUNTER — Other Ambulatory Visit: Payer: Self-pay | Admitting: Internal Medicine

## 2023-06-21 DIAGNOSIS — E119 Type 2 diabetes mellitus without complications: Secondary | ICD-10-CM

## 2023-06-21 MED ORDER — ACCU-CHEK SOFTCLIX LANCETS MISC: 6 refills | Status: AC

## 2023-06-21 MED ORDER — ACCU-CHEK GUIDE TEST VI STRP: ORAL_STRIP | 6 refills | Status: AC

## 2023-06-21 NOTE — Telephone Encounter (Signed)
The fastclix and the softclix lancets are different. Unfortunately, [facility] was [facility]. Fastclix were covered several years ago, but now the softclix are the covered lancet to use with this patient's particular Accu Chek device. Just in case [facility] did not [facility] the [facility], I sent in an updated rxn for Accu Chek softclix lancets.   Additionally, the Epic order for accu chek guide test strips changed a couple of months ago. Same strip, just different order in Epic. I sent in an updated rxn for the test strips this morning as well. Just in case the pharmacy did not receive your order yesterday.

## 2023-06-26 ENCOUNTER — Other Ambulatory Visit: Payer: Self-pay | Admitting: Internal Medicine

## 2023-06-26 ENCOUNTER — Encounter (HOSPITAL_COMMUNITY): Payer: Self-pay

## 2023-06-26 ENCOUNTER — Ambulatory Visit (HOSPITAL_COMMUNITY)
Admission: EM | Admit: 2023-06-26 | Discharge: 2023-06-26 | Disposition: A | Discharge status: Home / Self Care | Payer: 59 | Attending: Emergency Medicine | Admitting: Emergency Medicine

## 2023-06-26 DIAGNOSIS — E785 Hyperlipidemia, unspecified: Secondary | ICD-10-CM

## 2023-06-26 DIAGNOSIS — E1165 Type 2 diabetes mellitus with hyperglycemia: Secondary | ICD-10-CM | POA: Diagnosis not present

## 2023-06-26 DIAGNOSIS — I152 Hypertension secondary to endocrine disorders: Secondary | ICD-10-CM

## 2023-06-26 LAB — POCT FASTING CBG KUC MANUAL ENTRY: POCT Glucose (KUC): 149 mg/dL — AB (ref 70–99)

## 2023-06-26 NOTE — Discharge Instructions (Addendum)
 Please log/track blood sugar readings to review with PCP May need diabetic education/counseling, referral to endocrinology Take home meds as prescribed, well balanced meals Drink plenty of water Go to ER or call 911 if you have new or worsening issues(high blood sugar, low blood sugar, vomiting, chest pain,palpitations, etc)

## 2023-06-26 NOTE — ED Provider Notes (Signed)
 [facility]    CSN: [id number] Arrival date & time: 06/26/23  1719      History   Chief Complaint Chief Complaint  Patient presents with   Hyperglycemia    HPI Lynzee Nandana Krolikowski is a 68 y.o. female.   68 year old female, Leonie Amacher, presents to urgent care for evaluation of fluctuating blood sugar that has been running from 40's to 200's.  Patient states she only takes metformin for her blood sugar and is followed by PCP. Pt denies any chest pain,palpations, SOB, nausea,vomiting  The history is provided by the patient. No language interpreter was used.    Past Medical History:  Diagnosis Date   Diabetes mellitus    Hyperlipidemia    Hypertension     Patient Active Problem List   Diagnosis Date Noted   Aortic atherosclerosis (HCC) 05/05/2021   Tetanus, diphtheria, and acellular pertussis (Tdap) vaccination declined 05/10/2020   Hyperlipidemia associated with type 2 diabetes mellitus (HCC) 11/18/2019   Chronic pain of right knee 11/18/2019   Mixed hyperlipidemia 06/25/2018   Over weight 06/25/2018   Stress due to family tension 11/13/2017   DM type 2 (diabetes mellitus, type 2) (HCC) 03/05/2014   HYPERCHOLESTEROLEMIA 03/12/2007   OBESITY 03/12/2007   Tobacco dependence 03/12/2007   HTN (hypertension) 03/12/2007   ALLERGIC RHINITIS, SEASONAL 03/12/2007    Past Surgical History:  Procedure Laterality Date   FOOT SURGERY     MICROLARYNGOSCOPY N/A 01/18/2022   Procedure: MICROLARYNGOSCOPY WITH EXCISION OF POLYPS;  Surgeon: Serena Colonel, MD;  Location: [facility];  Service: ENT;  Laterality: N/A;    OB History   No obstetric history on file.      Home Medications    Prior to Admission medications   Medication Sig Start Date End Date Taking? Authorizing Provider  Accu-Chek Softclix Lancets lancets Use as instructed to check blood sugar once a day. E11.9 06/21/23   Marcine Matar, MD  albuterol (VENTOLIN HFA) 108 (90 Base) MCG/ACT inhaler  Inhale 2 puffs into the lungs every 4 (four) hours as needed for wheezing or shortness of breath. 12/01/22   Zenia Resides, MD  amLODipine (NORVASC) 10 MG tablet Take 1 tablet (10 mg total) by mouth daily. 09/29/22   Marcine Matar, MD  atorvastatin (LIPITOR) 80 MG tablet TAKE 1 TABLET BY MOUTH EVERY DAY 06/12/23   Marcine Matar, MD  Blood Glucose Monitoring Suppl (ACCU-CHEK GUIDE ME) w/Device KIT Use as directed 06/14/23   Marcine Matar, MD  Blood Pressure Monitoring (BLOOD PRESSURE CUFF) MISC Use to check blood pressure once daily. 03/08/23   Marcine Matar, MD  empagliflozin (JARDIANCE) 10 MG TABS tablet Take 1 tablet (10 mg total) by mouth daily before breakfast. 06/14/23   Marcine Matar, MD  fluticasone [facility]) 50 MCG/ACT nasal spray Place 1 spray into both nostrils daily. 02/08/23   Marcine Matar, MD  glimepiride (AMARYL) 4 MG tablet 1 tab PO twice a day 02/08/23   Marcine Matar, MD  glucose blood (ACCU-CHEK GUIDE TEST) test strip Use as instructed to check blood sugar once a day. E11.9 06/21/23   Marcine Matar, MD  lisinopril (ZESTRIL) 30 MG tablet TAKE 1 TABLET BY MOUTH EVERY DAY 05/16/23   Marcine Matar, MD  metFORMIN (GLUCOPHAGE) 1000 MG tablet TAKE 1 TABLET BY MOUTH 2 (TWO) TIMES DAILY WITH A MEAL. FOR DIABETES 06/20/23   Marcine Matar, MD    Family History Family History  Problem Relation  Age of Onset   Hypertension Mother    Diabetes Mother    COPD Father    Heart disease Father    Diabetes Father     Social History Social History   Tobacco Use   Smoking status: Former   Smokeless tobacco: Never  Advertising account planner   Vaping status: Never Used  Substance Use Topics   Alcohol use: No   Drug use: No     Allergies   Penicillins   Review of Systems Review of Systems  Constitutional:  Negative for fatigue and fever.  Cardiovascular:  Negative for chest pain and palpitations.  Gastrointestinal:  Negative for nausea and vomiting.   All other systems reviewed and are negative.    Physical Exam Triage Vital Signs ED Triage Vitals  Encounter Vitals Group     BP 06/26/23 1833 110/74     Systolic BP Percentile --      Diastolic BP Percentile --      Pulse Rate 06/26/23 1833 (!) 104     Resp 06/26/23 1833 16     Temp 06/26/23 1833 98.3 F (36.8 C)     Temp Source 06/26/23 1833 Oral     SpO2 06/26/23 1833 97 %     Weight 06/26/23 1835 187 lb (84.8 kg)     Height 06/26/23 1835 6' (1.829 m)     Head Circumference --      Peak Flow --      Pain Score 06/26/23 1836 0     Pain Loc --      Pain Education --      Exclude from Growth Chart --    No data found.  Updated Vital Signs BP 110/74 (BP Location: Right Arm)   Pulse (!) 104   Temp 98.3 F (36.8 C) (Oral)   Resp 16   Ht 6' (1.829 m)   Wt 187 lb (84.8 kg)   SpO2 97%   BMI 25.36 kg/m   Visual Acuity Right Eye Distance:   Left Eye Distance:   Bilateral Distance:    Right Eye Near:   Left Eye Near:    Bilateral Near:     Physical Exam Vitals and nursing note reviewed.  Constitutional:      General: She is not in acute distress.    Appearance: She is well-developed and well-groomed.  HENT:     Head: Normocephalic and atraumatic.  Eyes:     Conjunctiva/sclera: Conjunctivae normal.  Cardiovascular:     Rate and Rhythm: Normal rate.     Heart sounds: No murmur heard. Pulmonary:     Effort: Pulmonary effort is normal. No respiratory distress.  Abdominal:     Palpations: Abdomen is soft.     Tenderness: There is no abdominal tenderness.  Musculoskeletal:        General: No swelling.     Cervical back: Neck supple.  Skin:    General: Skin is warm and dry.     Capillary Refill: Capillary refill takes less than 2 seconds.  Neurological:     General: No focal deficit present.     Mental Status: She is alert and oriented to person, place, and time.     GCS: GCS eye subscore is 4. GCS verbal subscore is 5. GCS motor subscore is 6.   Psychiatric:        Attention and Perception: Attention normal.        Mood and Affect: Mood normal.  Speech: Speech normal.        Behavior: Behavior normal. Behavior is cooperative.      UC Treatments / Results  Labs (all labs ordered are listed, but only abnormal results are displayed) Labs Reviewed  POCT FASTING CBG [facility] MANUAL ENTRY - Abnormal; Notable for the following components:      Result Value   POCT Glucose ([facility]) 149 (*)    All other components within normal limits    EKG   Radiology No results found.  Procedures Procedures (including critical care time)  Medications Ordered in UC Medications - No data to display  Initial Impression / Assessment and Plan / UC Course  I have reviewed the triage vital signs and the nursing notes.  Pertinent labs & imaging results that were available during my care of the patient were reviewed by me and considered in my medical decision making (see chart for details).  Clinical Course as of 06/26/23 2000  Tue Jun 26, 2023  1949 Bs 149 in office [JD]    Clinical Course User Index [JD] Camiya Vinal, Para March, NP   Discussed exam findings and plan of care with patient, strict go to ER precautions given.   Patient verbalized understanding to this provider.  Ddx: Hyperglycemia, hypoglycemia, viral illness, noncompliance with medication regimen Final Clinical Impressions(s) / UC Diagnoses   Final diagnoses:  Type 2 diabetes mellitus with hyperglycemia, without long-term current use of insulin [facility])     Discharge Instructions      Please log/track blood sugar readings to review with PCP May need diabetic education/counseling, referral to endocrinology Take home meds as prescribed, well balanced meals Drink plenty of water Go to ER or call 911 if you have new or worsening issues(high blood sugar, low blood sugar, vomiting, chest pain,palpitations, etc)    ED Prescriptions   None    PDMP not reviewed this  encounter.   Clancy Gourd, NP 06/26/23 2000

## 2023-06-26 NOTE — ED Triage Notes (Signed)
 Patient here today with c/o her glucose going up and down. Patient has been monitoring her glucose and has noticed that at time she would see that her glucose was in the 100s and 200s but then she will test it other times and it would be in the 40s.

## 2023-06-27 NOTE — Telephone Encounter (Signed)
 Requested Prescriptions  Pending Prescriptions Disp Refills   glimepiride (AMARYL) 2 MG tablet [Pharmacy Med Name: GLIMEPIRIDE 2 MG TABLET] 270 tablet 1    Sig: TAKE 2 TABS (4 MG) IN THE MORNING AND 1 TABLET (2 MG) IN THE AFTERNOON.     Endocrinology:  Diabetes - Sulfonylureas Passed - 06/27/2023 11:04 AM      Passed - HBA1C is between 0 and 7.9 and within 180 days    HbA1c, POC (controlled diabetic range)  Date Value Ref Range Status  06/14/2023 7.9 (A) 0.0 - 7.0 % Final         Passed - Cr in normal range and within 360 days    Creat  Date Value Ref Range Status  04/04/2016 1.11 (H) 0.50 - 0.99 mg/dL Final    Comment:      For patients > or = 69 years of age: The upper reference limit for Creatinine is approximately 13% higher for people identified as African-American.      Creatinine, Ser  Date Value Ref Range Status  02/08/2023 0.78 0.57 - 1.00 mg/dL Final   Creatinine, Urine  Date Value Ref Range Status  04/04/2016 43 20 - 320 mg/dL Final         Passed - Valid encounter within last 6 months    Recent Outpatient Visits           1 week ago Type 2 diabetes mellitus without complication, without long-term current use of insulin (HCC)   [facility] - [facility] Dept Of [place]. [facility] Jonah Blue B, MD   4 months ago Type 2 diabetes mellitus with hyperlipidemia [facility])   [facility] - A Dept Of [place]. [facility] Jonah Blue B, MD   9 months ago Type 2 diabetes mellitus with hyperlipidemia [facility])   [facility] - A Dept Of [place]. [facility] Marcine Matar, MD   11 months ago Essential hypertension   [facility] [facility] [place] - A Dept Of [place]. [facility] Drucilla Chalet, RPH-CPP   1 year ago Need for vaccination for Strep pneumoniae   [facility] - A Dept Of [place]. [facility] Lois Huxley, Stephen L, RPH-CPP                lisinopril (ZESTRIL) 30 MG tablet [Pharmacy Med Name: LISINOPRIL 30 MG TABLET] 90 tablet 0    Sig: TAKE 1 TABLET BY MOUTH EVERY DAY     Cardiovascular:  ACE Inhibitors Passed - 06/27/2023 11:04 AM      Passed - Cr in normal range and within 180 days    Creat  Date Value Ref Range Status  04/04/2016 1.11 (H) 0.50 - 0.99 mg/dL Final    Comment:      For patients > or = 69 years of age: The upper reference limit for Creatinine is approximately 13% higher for people identified as African-American.      Creatinine, Ser  Date Value Ref Range Status  02/08/2023 0.78 0.57 - 1.00 mg/dL Final   Creatinine, Urine  Date Value Ref Range Status  04/04/2016 43 20 - 320 mg/dL Final         Passed - K in normal range and within 180 days    Potassium  Date Value Ref Range Status  02/08/2023 4.1 3.5 - 5.2 mmol/L Final  Passed - Patient is not pregnant      Passed - Last BP in normal range    BP Readings from Last 1 Encounters:  06/26/23 110/74         Passed - Valid encounter within last 6 months    Recent Outpatient Visits           1 week ago Type 2 diabetes mellitus without complication, without long-term current use of insulin (HCC)   [facility] [place] [facility] - A Dept Of [place]. [facility] Jonah Blue B, MD   4 months ago Type 2 diabetes mellitus with hyperlipidemia [facility])   [place] [facility] - A Dept Of [place]. [facility] Jonah Blue B, MD   9 months ago Type 2 diabetes mellitus with hyperlipidemia [facility])   [facility] [place] [facility] - A Dept Of [place]. [facility] Marcine Matar, MD   11 months ago Essential hypertension   [facility] [place] - A Dept Of [place]. [facility] Drucilla Chalet, RPH-CPP   1 year ago Need for vaccination for Strep pneumoniae   [facility] - A Dept Of [place]. [facility] Lois Huxley,  Stephen L, RPH-CPP               amLODipine (NORVASC) 10 MG tablet [Pharmacy Med Name: AMLODIPINE BESYLATE 10 MG TAB] 90 tablet 1    Sig: TAKE 1 TABLET BY MOUTH EVERY DAY     Cardiovascular: Calcium Channel Blockers 2 Passed - 06/27/2023 11:04 AM      Passed - Last BP in normal range    BP Readings from Last 1 Encounters:  06/26/23 110/74         Passed - Last Heart Rate in normal range    Pulse Readings from Last 1 Encounters:  06/26/23 (!) 104         Passed - Valid encounter within last 6 months    Recent Outpatient Visits           1 week ago Type 2 diabetes mellitus without complication, without long-term current use of insulin (HCC)   [facility] [place] - A Dept Of [place]. [facility] Jonah Blue B, MD   4 months ago Type 2 diabetes mellitus with hyperlipidemia [facility])   [place] [facility] - A Dept Of [place]. [facility] Jonah Blue B, MD   9 months ago Type 2 diabetes mellitus with hyperlipidemia [facility])   [facility] - A Dept Of [place]. [facility] Marcine Matar, MD   11 months ago Essential hypertension   [place] [facility] [place] - A Dept Of [place]. [facility] Drucilla Chalet, RPH-CPP   1 year ago Need for vaccination for Strep pneumoniae   [facility] [facility] - A Dept Of [place]. [facility] Lois Huxley, Stephen L, RPH-CPP               glimepiride (AMARYL) 4 MG tablet [Pharmacy Med Name: GLIMEPIRIDE 4 MG TABLET] 180 tablet 1    Sig: TAKE 1 TABLET BY MOUTH TWICE A DAY     Endocrinology:  Diabetes - Sulfonylureas Passed - 06/27/2023 11:04 AM      Passed - HBA1C is between 0 and 7.9 and within 180 days    HbA1c, POC (  controlled diabetic range)  Date Value Ref Range Status  06/14/2023 7.9 (A) 0.0 - 7.0 % Final         Passed - Cr in normal range and within 360 days    Creat  Date Value Ref Range Status  04/04/2016  1.11 (H) 0.50 - 0.99 mg/dL Final    Comment:      For patients > or = 69 years of age: The upper reference limit for Creatinine is approximately 13% higher for people identified as African-American.      Creatinine, Ser  Date Value Ref Range Status  02/08/2023 0.78 0.57 - 1.00 mg/dL Final   Creatinine, Urine  Date Value Ref Range Status  04/04/2016 43 20 - 320 mg/dL Final         Passed - Valid encounter within last 6 months    Recent Outpatient Visits           1 week ago Type 2 diabetes mellitus without complication, without long-term current use of insulin (HCC)   [facility] [facility] - A Dept Of [place]. [facility] Jonah Blue B, MD   4 months ago Type 2 diabetes mellitus with hyperlipidemia [facility])   [place] [facility] - A Dept Of [place]. [facility] Jonah Blue B, MD   9 months ago Type 2 diabetes mellitus with hyperlipidemia [facility])   [place] [facility] - A Dept Of [place]. [facility] Marcine Matar, MD   11 months ago Essential hypertension   [place] [facility] [place] - A Dept Of [place]. [facility] Drucilla Chalet, RPH-CPP   1 year ago Need for vaccination for Strep pneumoniae   [place] [facility] - A Dept Of [place]. [facility] Drucilla Chalet, RPH-CPP

## 2023-06-27 NOTE — Telephone Encounter (Signed)
 Glimepiride D/C 02/08/23. Lisinopril refilled 05/16/23. Requested Prescriptions  Signed Prescriptions Disp Refills   amLODipine (NORVASC) 10 MG tablet 90 tablet 1    Sig: TAKE 1 TABLET BY MOUTH EVERY DAY     Cardiovascular: Calcium Channel Blockers 2 Passed - 06/27/2023 11:10 AM      Passed - Last BP in normal range    BP Readings from Last 1 Encounters:  06/26/23 110/74         Passed - Last Heart Rate in normal range    Pulse Readings from Last 1 Encounters:  06/26/23 (!) 104         Passed - Valid encounter within last 6 months    Recent Outpatient Visits           1 week ago Type 2 diabetes mellitus without complication, without long-term current use of insulin (HCC)   [place] [facility] [place] - A Dept Of [place]. [facility] Jonah Blue B, MD   4 months ago Type 2 diabetes mellitus with hyperlipidemia [facility])   [facility] [facility] - A Dept Of [place]. [facility] Jonah Blue B, MD   9 months ago Type 2 diabetes mellitus with hyperlipidemia [facility])   [place] [facility] - A Dept Of [place]. [facility] Marcine Matar, MD   11 months ago Essential hypertension   [place] [facility] [place] [facility] [place] - A Dept Of [place]. [facility] Drucilla Chalet, RPH-CPP   1 year ago Need for vaccination for Strep pneumoniae   [facility] - A Dept Of [place]. [facility] Lois Huxley, Stephen L, RPH-CPP               glimepiride (AMARYL) 4 MG tablet 180 tablet 1    Sig: TAKE 1 TABLET BY MOUTH TWICE A DAY     Endocrinology:  Diabetes - Sulfonylureas Passed - 06/27/2023 11:10 AM      Passed - HBA1C is between 0 and 7.9 and within 180 days    HbA1c, POC (controlled diabetic range)  Date Value Ref Range Status  06/14/2023 7.9 (A) 0.0 - 7.0 % Final         Passed - Cr in normal range and within 360 days    Creat  Date Value Ref Range Status  04/04/2016 1.11 (H) 0.50 -  0.99 mg/dL Final    Comment:      For patients > or = 69 years of age: The upper reference limit for Creatinine is approximately 13% higher for people identified as African-American.      Creatinine, Ser  Date Value Ref Range Status  02/08/2023 0.78 0.57 - 1.00 mg/dL Final   Creatinine, Urine  Date Value Ref Range Status  04/04/2016 43 20 - 320 mg/dL Final         Passed - Valid encounter within last 6 months    Recent Outpatient Visits           1 week ago Type 2 diabetes mellitus without complication, without long-term current use of insulin (HCC)   [place] [facility] - A Dept Of [place]. [facility] Jonah Blue B, MD   4 months ago Type 2 diabetes mellitus with hyperlipidemia [facility])   [place] [facility] - A Dept Of [place]. [facility] Jonah Blue B, MD   9 months ago Type 2 diabetes mellitus  with hyperlipidemia [facility])   [place] [facility] - A Dept Of [place]. [facility] Marcine Matar, MD   11 months ago Essential hypertension   [place] [facility] [place] - A Dept Of [place]. [facility] Drucilla Chalet, RPH-CPP   1 year ago Need for vaccination for Strep pneumoniae   [facility] [facility] - A Dept Of [place]. [facility] Lois Huxley, Cornelius Moras, RPH-CPP              Refused Prescriptions Disp Refills   glimepiride (AMARYL) 2 MG tablet [Pharmacy Med Name: GLIMEPIRIDE 2 MG TABLET] 270 tablet 1    Sig: TAKE 2 TABS (4 MG) IN THE MORNING AND 1 TABLET (2 MG) IN THE AFTERNOON.     Endocrinology:  Diabetes - Sulfonylureas Passed - 06/27/2023 11:10 AM      Passed - HBA1C is between 0 and 7.9 and within 180 days    HbA1c, POC (controlled diabetic range)  Date Value Ref Range Status  06/14/2023 7.9 (A) 0.0 - 7.0 % Final         Passed - Cr in normal range and within 360 days    Creat  Date Value Ref Range Status  04/04/2016 1.11 (H) 0.50 - 0.99  mg/dL Final    Comment:      For patients > or = 69 years of age: The upper reference limit for Creatinine is approximately 13% higher for people identified as African-American.      Creatinine, Ser  Date Value Ref Range Status  02/08/2023 0.78 0.57 - 1.00 mg/dL Final   Creatinine, Urine  Date Value Ref Range Status  04/04/2016 43 20 - 320 mg/dL Final         Passed - Valid encounter within last 6 months    Recent Outpatient Visits           1 week ago Type 2 diabetes mellitus without complication, without long-term current use of insulin (HCC)   [facility] [facility] - A Dept Of [place]. [facility] Jonah Blue B, MD   4 months ago Type 2 diabetes mellitus with hyperlipidemia [facility])   [place] [facility] - A Dept Of [place]. [facility] Jonah Blue B, MD   9 months ago Type 2 diabetes mellitus with hyperlipidemia [facility])   [place] [facility] - A Dept Of [place]. [facility] Marcine Matar, MD   11 months ago Essential hypertension   [place] [facility] [place] - A Dept Of [place]. [facility] Drucilla Chalet, RPH-CPP   1 year ago Need for vaccination for Strep pneumoniae   [facility] [place] [facility] - A Dept Of . [facility] Lois Huxley, Stephen L, RPH-CPP               lisinopril (ZESTRIL) 30 MG tablet [Pharmacy Med Name: LISINOPRIL 30 MG TABLET] 90 tablet 0    Sig: TAKE 1 TABLET BY MOUTH EVERY DAY     Cardiovascular:  ACE Inhibitors Passed - 06/27/2023 11:10 AM      Passed - Cr in normal range and within 180 days    Creat  Date Value Ref Range Status  04/04/2016 1.11 (H) 0.50 - 0.99 mg/dL Final    Comment:      For patients > or = 69 years of age: The upper  reference limit for Creatinine is approximately 13% higher for people identified as African-American.      Creatinine, Ser  Date Value Ref Range Status  02/08/2023 0.78 0.57 - 1.00  mg/dL Final   Creatinine, Urine  Date Value Ref Range Status  04/04/2016 43 20 - 320 mg/dL Final         Passed - K in normal range and within 180 days    Potassium  Date Value Ref Range Status  02/08/2023 4.1 3.5 - 5.2 mmol/L Final         Passed - Patient is not pregnant      Passed - Last BP in normal range    BP Readings from Last 1 Encounters:  06/26/23 110/74         Passed - Valid encounter within last 6 months    Recent Outpatient Visits           1 week ago Type 2 diabetes mellitus without complication, without long-term current use of insulin (HCC)   [facility] [place] [facility] [place] - A Dept Of [place]. [facility] Jonah Blue B, MD   4 months ago Type 2 diabetes mellitus with hyperlipidemia [facility])   [facility] - A Dept Of [place]. [facility] Jonah Blue B, MD   9 months ago Type 2 diabetes mellitus with hyperlipidemia [facility])   [place] [facility] - A Dept Of [place]. [facility] Marcine Matar, MD   11 months ago Essential hypertension   [place] [facility] [place] - A Dept Of [place]. [facility] Drucilla Chalet, RPH-CPP   1 year ago Need for vaccination for Strep pneumoniae   [facility] [facility] - A Dept Of [place]. [facility] Drucilla Chalet, RPH-CPP

## 2023-08-14 ENCOUNTER — Other Ambulatory Visit: Payer: Self-pay | Admitting: Internal Medicine

## 2023-08-14 DIAGNOSIS — E1169 Type 2 diabetes mellitus with other specified complication: Secondary | ICD-10-CM

## 2023-08-14 DIAGNOSIS — E1159 Type 2 diabetes mellitus with other circulatory complications: Secondary | ICD-10-CM

## 2023-09-15 ENCOUNTER — Other Ambulatory Visit: Payer: Self-pay | Admitting: Internal Medicine

## 2023-09-15 DIAGNOSIS — E1169 Type 2 diabetes mellitus with other specified complication: Secondary | ICD-10-CM

## 2023-09-15 DIAGNOSIS — E1159 Type 2 diabetes mellitus with other circulatory complications: Secondary | ICD-10-CM

## 2023-10-12 ENCOUNTER — Ambulatory Visit: Payer: 59 | Admitting: Internal Medicine

## 2023-10-24 ENCOUNTER — Other Ambulatory Visit: Payer: Self-pay | Admitting: Internal Medicine

## 2023-10-24 DIAGNOSIS — Z1231 Encounter for screening mammogram for malignant neoplasm of breast: Secondary | ICD-10-CM

## 2023-10-29 ENCOUNTER — Ambulatory Visit: Attending: Internal Medicine | Admitting: Internal Medicine

## 2023-10-29 ENCOUNTER — Encounter: Payer: Self-pay | Admitting: Internal Medicine

## 2023-10-29 VITALS — BP 127/74 | HR 88 | Temp 98.0°F | Wt 179.0 lb

## 2023-10-29 DIAGNOSIS — E119 Type 2 diabetes mellitus without complications: Secondary | ICD-10-CM | POA: Diagnosis not present

## 2023-10-29 DIAGNOSIS — Z7984 Long term (current) use of oral hypoglycemic drugs: Secondary | ICD-10-CM | POA: Diagnosis not present

## 2023-10-29 DIAGNOSIS — E785 Hyperlipidemia, unspecified: Secondary | ICD-10-CM | POA: Diagnosis not present

## 2023-10-29 DIAGNOSIS — E1159 Type 2 diabetes mellitus with other circulatory complications: Secondary | ICD-10-CM

## 2023-10-29 DIAGNOSIS — E1169 Type 2 diabetes mellitus with other specified complication: Secondary | ICD-10-CM | POA: Diagnosis not present

## 2023-10-29 DIAGNOSIS — I152 Hypertension secondary to endocrine disorders: Secondary | ICD-10-CM | POA: Diagnosis not present

## 2023-10-29 DIAGNOSIS — Z5320 Procedure and treatment not carried out because of patient's decision for unspecified reasons: Secondary | ICD-10-CM | POA: Diagnosis not present

## 2023-10-29 LAB — POCT GLYCOSYLATED HEMOGLOBIN (HGB A1C): HbA1c, POC (controlled diabetic range): 7.8 % — AB (ref 0.0–7.0)

## 2023-10-29 LAB — GLUCOSE, POCT (MANUAL RESULT ENTRY): POC Glucose: 137 mg/dL — AB (ref 70–99)

## 2023-10-29 MED ORDER — ATORVASTATIN CALCIUM 80 MG PO TABS: 80.0000 mg | ORAL_TABLET | Freq: Every day | ORAL | 1 refills | Status: DC

## 2023-10-29 MED ORDER — LISINOPRIL 30 MG PO TABS: 30.0000 mg | ORAL_TABLET | Freq: Every day | ORAL | 1 refills | Status: DC

## 2023-10-29 MED ORDER — EMPAGLIFLOZIN 10 MG PO TABS: 10.0000 mg | ORAL_TABLET | Freq: Every day | ORAL | 5 refills | Status: DC

## 2023-10-29 MED ORDER — METFORMIN HCL 1000 MG PO TABS: ORAL_TABLET | ORAL | 1 refills | Status: DC

## 2023-10-29 MED ORDER — ALBUTEROL SULFATE HFA 108 (90 BASE) MCG/ACT IN AERS: 2.0000 | INHALATION_SPRAY | RESPIRATORY_TRACT | 0 refills | Status: DC | PRN

## 2023-10-29 MED ORDER — GLIMEPIRIDE 4 MG PO TABS: 4.0000 mg | ORAL_TABLET | Freq: Two times a day (BID) | ORAL | 1 refills | Status: DC

## 2023-10-29 NOTE — Patient Instructions (Addendum)
 Please call [facility]  to schedule your eye appointment [address]   [place], [place] [address] PH# [phone] Fax  [phone]  Please pick up prescription for the Jardiance  10 mg and take one tablet daily.  This is the new medication being added for your diabetes

## 2023-10-29 NOTE — Progress Notes (Signed)
 Patient ID: Stephanie Neal, female    DOB: April 03, 1956  MRN: [id number]  CC: Diabetes (DM f/u. Med refill./Reports that pharmacy is giving pt the 40 mg & 80 mg bottles - pt only taking 80 mg rx/On-going congestion & sneezing due to seasonal allergies )   Subjective: Stephanie Neal is a 68 y.o. female who presents for chronic ds management. Her concerns today include:  Hx of DM, HTN, former smoker, obesity, HL, vocal polyp removed 12/2021.     Discussed the use of AI scribe software for clinical note transcription with the patient, who gave verbal consent to proceed.  History of Present Illness Stephanie Neal is a 68 year old female with diabetes, hypertension, and hyperlipidemia who presents for follow-up.  DM: Results for orders placed or performed in visit on 10/29/23  POCT glucose (manual entry)   Collection Time: 10/29/23 11:02 AM  Result Value Ref Range   POC Glucose 137 (A) 70 - 99 mg/dl  POCT glycosylated hemoglobin (Hb A1C)   Collection Time: 10/29/23 11:27 AM  Result Value Ref Range   Hemoglobin A1C     HbA1c POC (<> result, manual entry)     HbA1c, POC (prediabetic range)     HbA1c, POC (controlled diabetic range) 7.8 (A) 0.0 - 7.0 %  She manages her diabetes with glipizide 4 mg twice daily and metformin  1000 mg twice daily but has not started Jardiance  10 mg daily, prescribed in February.  Reports that the pharmacy did not give it to her.  She has not been checking her blood sugars recently but recalls normal readings when she did. Her diet includes avoiding sugary snacks and drinks, with occasional soft drinks during family gatherings. She maintains hydration with water and consumes fruits and vegetables. She exercises at home, performing sit-ups and leg workouts about three times a week.  HTN: on Amlodipine  10 mg daily and lisinopril  30 mg daily. She experiences difficulty with stairs in her residence, which she has been climbing for almost eight  years. She wants to move due to the physical strain.  Her hyperlipidemia is managed with atorvastatin  80 mg daily. There was a pharmacy issue with dispensing both 80 mg and 40 mg doses, but she is only taking the 80 mg dose. She uses Mrs. Dash to limit salt intake in her diet.  HM: She is due for a diabetic eye exam. referred to [facility] but they were unable to reach her. She plans to reschedule with [facility], which accepts her insurance. She declines DEXA scan for osteoporosis screening.  Due for Medicare wellness visit.    Patient Active Problem List   Diagnosis Date Noted   Aortic atherosclerosis (HCC) 05/05/2021   Tetanus, diphtheria, and acellular pertussis (Tdap) vaccination declined 05/10/2020   Hyperlipidemia associated with type 2 diabetes mellitus (HCC) 11/18/2019   Chronic pain of right knee 11/18/2019   Mixed hyperlipidemia 06/25/2018   Over weight 06/25/2018   Stress due to family tension 11/13/2017   DM type 2 (diabetes mellitus, type 2) (HCC) 03/05/2014   HYPERCHOLESTEROLEMIA 03/12/2007   OBESITY 03/12/2007   Tobacco dependence 03/12/2007   HTN (hypertension) 03/12/2007   ALLERGIC RHINITIS, SEASONAL 03/12/2007     Current Outpatient Medications on File Prior to Visit  Medication Sig Dispense Refill   Accu-Chek Softclix Lancets lancets Use as instructed to check blood sugar once a day. E11.9 100 each 6   amLODipine  (NORVASC ) 10 MG tablet TAKE 1 TABLET BY MOUTH EVERY  DAY 90 tablet 1   Blood Glucose Monitoring Suppl (ACCU-CHEK GUIDE ME) w/Device KIT Use as directed 1 kit 0   Blood Pressure Monitoring (BLOOD PRESSURE CUFF) MISC Use to check blood pressure once daily. 1 each 0   fluticasone  (FLONASE ) 50 MCG/ACT nasal spray Place 1 spray into both nostrils daily. 16 g 6   glucose blood (ACCU-CHEK GUIDE TEST) test strip Use as instructed to check blood sugar once a day. E11.9 100 each 6   No current facility-administered medications on file prior to visit.     Allergies  Allergen Reactions   Penicillins Other (See Comments)    blanked out  Has patient had a PCN reaction causing immediate rash, facial/tongue/throat swelling, SOB or lightheadedness with hypotension: No  Has patient had a PCN reaction causing severe rash involving mucus membranes or skin necrosis: No  Has patient had a PCN reaction that required hospitalization No  Has patient had a PCN reaction occurring within the last 10 years: No  If all of the above answers are NO, then may proceed with Cephalosporin use.  Other Reaction(s): Dizziness    Social History   Socioeconomic History   Marital status: Single    Spouse name: Not on file   Number of children: Not on file   Years of education: Not on file   Highest education level: 12th grade  Occupational History   Not on file  Tobacco Use   Smoking status: Former   Smokeless tobacco: Never  Vaping Use   Vaping status: Never Used  Substance and Sexual Activity   Alcohol use: No   Drug use: No   Sexual activity: Not on file  Other Topics Concern   Not on file  Social History Narrative   Not on file   Social Drivers of Health   Financial Resource Strain: Low Risk  (10/10/2023)   Overall Financial Resource Strain (CARDIA)    Difficulty of Paying Living Expenses: Not hard at all  Food Insecurity: No Food Insecurity (10/10/2023)   Hunger Vital Sign    Worried About Running Out of Food in the Last Year: Never true    Ran Out of Food in the Last Year: Never true  Transportation Needs: No Transportation Needs (10/10/2023)   PRAPARE - Administrator, Civil Service (Medical): No    Lack of Transportation (Non-Medical): No  Physical Activity: Insufficiently Active (10/10/2023)   Exercise Vital Sign    Days of Exercise per Week: 2 days    Minutes of Exercise per Session: 30 min  Stress: No Stress Concern Present (10/10/2023)   [employer] of Occupational Health - Occupational Stress  Questionnaire    Feeling of Stress : Not at all  Social Connections: Unknown (10/10/2023)   Social Connection and Isolation Panel    Frequency of Communication with Friends and Family: Three times a week    Frequency of Social Gatherings with Friends and Family: Three times a week    Attends Religious Services: Patient declined    Active Member of Clubs or Organizations: No    Attends Banker Meetings: Not on file    Marital Status: Divorced  Intimate Partner Violence: Not At Risk (02/08/2023)   Humiliation, Afraid, Rape, and Kick questionnaire    Fear of Current or Ex-Partner: No    Emotionally Abused: No    Physically Abused: No    Sexually Abused: No    Family History  Problem Relation Age of Onset   Hypertension  Mother    Diabetes Mother    COPD Father    Heart disease Father    Diabetes Father     Past Surgical History:  Procedure Laterality Date   FOOT SURGERY     MICROLARYNGOSCOPY N/A 01/18/2022   Procedure: MICROLARYNGOSCOPY WITH EXCISION OF POLYPS;  Surgeon: Jesus Oliphant, MD;  Location: [facility] OR;  Service: ENT;  Laterality: N/A;    ROS: Review of Systems Negative except as stated above  PHYSICAL EXAM: BP 127/74 (BP Location: Left Arm, Patient Position: Sitting, Cuff Size: Normal)   Pulse 88   Temp 98 F (36.7 C) (Oral)   Wt 179 lb (81.2 kg)   SpO2 93%   BMI 24.28 kg/m   Physical Exam  General appearance - alert, well appearing, and in no distress Mental status - normal mood, behavior, speech, dress, motor activity, and thought processes Neck - supple, no significant adenopathy Chest - clear to auscultation, no wheezes, rales or rhonchi, symmetric air entry Heart - normal rate, regular rhythm, normal S1, S2, no murmurs, rubs, clicks or gallops Extremities - peripheral pulses normal, no pedal edema, no clubbing or cyanosis Diabetic Foot Exam - Simple   Simple Foot Form Diabetic Foot exam was performed with the following findings: Yes 10/29/2023  11:44 AM  Visual Inspection See comments: Yes Sensation Testing Intact to touch and monofilament testing bilaterally: Yes Pulse Check Posterior Tibialis and Dorsalis pulse intact bilaterally: Yes Comments Noninflam bunions both big toes, nails thick.         Latest Ref Rng & Units 02/08/2023   11:59 AM 12/21/2021   11:05 AM 11/18/2021   10:13 AM  CMP  Glucose 70 - 99 mg/dL 826  868  874   BUN 8 - 27 mg/dL 8  9  10    Creatinine 0.57 - 1.00 mg/dL 9.21  9.25  9.22   Sodium 134 - 144 mmol/L 142  141  140   Potassium 3.5 - 5.2 mmol/L 4.1  4.1  4.1   Chloride 96 - 106 mmol/L 103  103  106   CO2 20 - 29 mmol/L 24  25  25    Calcium  8.7 - 10.3 mg/dL 9.3  9.3  9.0   Total Protein 6.0 - 8.5 g/dL 6.4  6.2    Total Bilirubin 0.0 - 1.2 mg/dL <9.7  0.3    Alkaline Phos 44 - 121 IU/L 77  58    AST 0 - 40 IU/L 15  14    ALT 0 - 32 IU/L 9  10     Lipid Panel     Component Value Date/Time   CHOL 171 02/08/2023 1159   TRIG 113 02/08/2023 1159   HDL 50 02/08/2023 1159   CHOLHDL 3.4 02/08/2023 1159   CHOLHDL 5.6 (H) 06/09/2015 1023   VLDL 22 06/09/2015 1023   LDLCALC 101 (H) 02/08/2023 1159    CBC    Component Value Date/Time   WBC 4.2 02/08/2023 1159   WBC 4.9 11/18/2021 1013   RBC 4.97 02/08/2023 1159   RBC 5.13 (H) 11/18/2021 1013   HGB 13.6 02/08/2023 1159   HCT 40.9 02/08/2023 1159   PLT 240 02/08/2023 1159   MCV 82 02/08/2023 1159   MCH 27.4 02/08/2023 1159   MCH 27.5 11/18/2021 1013   MCHC 33.3 02/08/2023 1159   MCHC 33.9 11/18/2021 1013   RDW 14.4 02/08/2023 1159   LYMPHSABS 1.2 12/21/2021 1105   MONOABS 0.5 11/18/2021 1013   EOSABS 0.1 12/21/2021 1105  BASOSABS 0.0 12/21/2021 1105    ASSESSMENT AND PLAN: 1. Type 2 diabetes mellitus with other specified complication, without long-term current use of insulin  (HCC) (Primary) Not at goal. Encouraged to continue healthy eating habits and trying to move as much as she can. Continue metformin  1 g twice a day and  Amaryl  4 mg twice a day.  I have resent prescription to her pharmacy for Jardiance  that we had added on last visit but she never picked up.  Encouraged her to pick up the medication.  Advised the medicine may cause more frequent urination so encouraged her to stay hydrated.  Also advised to let me know if she experiences any vaginal yeast or urinary tract infection while on the medicine. - POCT glycosylated hemoglobin (Hb A1C) - POCT glucose (manual entry) - metFORMIN  (GLUCOPHAGE ) 1000 MG tablet; TAKE 1 TABLET BY MOUTH 2 (TWO) TIMES DAILY WITH A MEAL. FOR DIABETES  Dispense: 180 tablet; Refill: 1 - glimepiride  (AMARYL ) 4 MG tablet; Take 1 tablet (4 mg total) by mouth 2 (two) times daily.  Dispense: 180 tablet; Refill: 1 - empagliflozin  (JARDIANCE ) 10 MG TABS tablet; Take 1 tablet (10 mg total) by mouth daily before breakfast.  Dispense: 30 tablet; Refill: 5 - Ambulatory referral to Ophthalmology  2. Diabetes mellitus treated with oral medication (HCC) See #1 above.  3. Type 2 diabetes mellitus with hyperlipidemia (HCC) Continue atorvastatin  80 mg.  Message sent to my CMA to call her pharmacy and have them cancel the 40 mg tablet. - atorvastatin  (LIPITOR) 80 MG tablet; Take 1 tablet (80 mg total) by mouth daily. Discontinue the 40 mg Atorvastatin   Dispense: 90 tablet; Refill: 1  4. Hypertension associated with diabetes (HCC) At goal.  Continue amlodipine  10 mg and lisinopril  30 mg daily - lisinopril  (ZESTRIL ) 30 MG tablet; Take 1 tablet (30 mg total) by mouth daily.  Dispense: 90 tablet; Refill: 1  5. Osteoporosis screening declined Recommended for DEXA scan.  Patient declines at this time.  Patient was given the opportunity to ask questions.  Patient verbalized understanding of the plan and was able to repeat key elements of the plan.   This documentation was completed using Paediatric nurse.  Any transcriptional errors are unintentional.  Orders Placed This Encounter   Procedures   Ambulatory referral to Ophthalmology   POCT glycosylated hemoglobin (Hb A1C)   POCT glucose (manual entry)     Requested Prescriptions   Signed Prescriptions Disp Refills   albuterol  (VENTOLIN  HFA) 108 (90 Base) MCG/ACT inhaler 1 each 0    Sig: Inhale 2 puffs into the lungs every 4 (four) hours as needed for wheezing or shortness of breath.   atorvastatin  (LIPITOR) 80 MG tablet 90 tablet 1    Sig: Take 1 tablet (80 mg total) by mouth daily. Discontinue the 40 mg Atorvastatin    metFORMIN  (GLUCOPHAGE ) 1000 MG tablet 180 tablet 1    Sig: TAKE 1 TABLET BY MOUTH 2 (TWO) TIMES DAILY WITH A MEAL. FOR DIABETES   glimepiride  (AMARYL ) 4 MG tablet 180 tablet 1    Sig: Take 1 tablet (4 mg total) by mouth 2 (two) times daily.   lisinopril  (ZESTRIL ) 30 MG tablet 90 tablet 1    Sig: Take 1 tablet (30 mg total) by mouth daily.   empagliflozin  (JARDIANCE ) 10 MG TABS tablet 30 tablet 5    Sig: Take 1 tablet (10 mg total) by mouth daily before breakfast.    Return in about 4 months (around 02/28/2024) for Medicare  Wellness Visit in 1-6   wks with CMA/LPN .  Barnie Louder, MD, FACP

## 2023-11-09 ENCOUNTER — Encounter

## 2023-11-16 ENCOUNTER — Ambulatory Visit
Admission: RE | Admit: 2023-11-16 | Discharge: 2023-11-16 | Disposition: A | Discharge status: Home / Self Care | Source: Ambulatory Visit | Attending: Internal Medicine | Admitting: Internal Medicine

## 2023-11-16 DIAGNOSIS — Z1231 Encounter for screening mammogram for malignant neoplasm of breast: Secondary | ICD-10-CM | POA: Diagnosis not present

## 2023-11-17 ENCOUNTER — Other Ambulatory Visit: Payer: Self-pay | Admitting: Internal Medicine

## 2023-11-17 DIAGNOSIS — E1159 Type 2 diabetes mellitus with other circulatory complications: Secondary | ICD-10-CM

## 2023-11-17 DIAGNOSIS — E1169 Type 2 diabetes mellitus with other specified complication: Secondary | ICD-10-CM

## 2023-11-17 DIAGNOSIS — E782 Mixed hyperlipidemia: Secondary | ICD-10-CM

## 2023-11-17 DIAGNOSIS — I152 Hypertension secondary to endocrine disorders: Secondary | ICD-10-CM

## 2023-11-19 MED ORDER — GLIMEPIRIDE 4 MG PO TABS: 4.0000 mg | ORAL_TABLET | Freq: Two times a day (BID) | ORAL | 1 refills | Status: DC

## 2023-11-19 MED ORDER — ATORVASTATIN CALCIUM 80 MG PO TABS: 80.0000 mg | ORAL_TABLET | Freq: Every day | ORAL | 1 refills | Status: DC

## 2023-11-21 ENCOUNTER — Ambulatory Visit: Payer: Self-pay | Admitting: Internal Medicine

## 2023-11-21 ENCOUNTER — Other Ambulatory Visit: Payer: Self-pay | Admitting: Internal Medicine

## 2023-11-21 DIAGNOSIS — R928 Other abnormal and inconclusive findings on diagnostic imaging of breast: Secondary | ICD-10-CM

## 2023-12-04 ENCOUNTER — Other Ambulatory Visit: Payer: Self-pay | Admitting: Internal Medicine

## 2023-12-04 ENCOUNTER — Ambulatory Visit
Admission: RE | Admit: 2023-12-04 | Discharge: 2023-12-04 | Disposition: A | Discharge status: Home / Self Care | Source: Ambulatory Visit | Attending: Internal Medicine | Admitting: Internal Medicine

## 2023-12-04 DIAGNOSIS — N6322 Unspecified lump in the left breast, upper inner quadrant: Secondary | ICD-10-CM | POA: Diagnosis not present

## 2023-12-04 DIAGNOSIS — R928 Other abnormal and inconclusive findings on diagnostic imaging of breast: Secondary | ICD-10-CM | POA: Diagnosis not present

## 2023-12-04 DIAGNOSIS — R92332 Mammographic heterogeneous density, left breast: Secondary | ICD-10-CM | POA: Diagnosis not present

## 2023-12-04 DIAGNOSIS — N6320 Unspecified lump in the left breast, unspecified quadrant: Secondary | ICD-10-CM

## 2023-12-08 ENCOUNTER — Other Ambulatory Visit: Payer: Self-pay | Admitting: Internal Medicine

## 2023-12-08 DIAGNOSIS — E1169 Type 2 diabetes mellitus with other specified complication: Secondary | ICD-10-CM

## 2023-12-09 ENCOUNTER — Ambulatory Visit: Payer: Self-pay | Admitting: Internal Medicine

## 2023-12-09 NOTE — Progress Notes (Signed)
 The radiologist saw an area of concern in your left breast on mammogram.  A biopsy is recommended. The radiologist has already spoken with you about this and looks like a date has been set for next month to have this biopsy done.

## 2023-12-11 ENCOUNTER — Other Ambulatory Visit

## 2024-01-08 ENCOUNTER — Ambulatory Visit
Admission: RE | Admit: 2024-01-08 | Discharge: 2024-01-08 | Disposition: A | Discharge status: Home / Self Care | Source: Ambulatory Visit | Attending: Internal Medicine | Admitting: Internal Medicine

## 2024-01-08 ENCOUNTER — Other Ambulatory Visit: Payer: Self-pay | Admitting: Internal Medicine

## 2024-01-08 DIAGNOSIS — Z170 Estrogen receptor positive status [ER+]: Secondary | ICD-10-CM | POA: Diagnosis not present

## 2024-01-08 DIAGNOSIS — N6320 Unspecified lump in the left breast, unspecified quadrant: Secondary | ICD-10-CM

## 2024-01-08 DIAGNOSIS — C50212 Malignant neoplasm of upper-inner quadrant of left female breast: Secondary | ICD-10-CM | POA: Diagnosis not present

## 2024-01-08 DIAGNOSIS — Z0000 Encounter for general adult medical examination without abnormal findings: Secondary | ICD-10-CM

## 2024-01-08 DIAGNOSIS — N6322 Unspecified lump in the left breast, upper inner quadrant: Secondary | ICD-10-CM | POA: Diagnosis not present

## 2024-01-08 HISTORY — PX: BREAST BIOPSY: SHX20

## 2024-01-09 LAB — SURGICAL PATHOLOGY

## 2024-01-10 ENCOUNTER — Telehealth: Payer: Self-pay | Admitting: *Deleted

## 2024-01-10 NOTE — Telephone Encounter (Signed)
 Spoke to patient to confirm upcoming afternoon [facility] clinic appointment on 9/17, paperwork will be sent via mail  Gave location and time, also informed patient that the surgeon's office would be calling as well to get information from them similar to the packet that they will be receiving so make sure to do both.  Reminded patient that all providers will be coming to the clinic to see them HERE and if they had any questions to not hesitate to reach back out to myself or their navigators.

## 2024-01-11 ENCOUNTER — Encounter: Payer: Self-pay | Admitting: *Deleted

## 2024-01-14 ENCOUNTER — Encounter: Payer: Self-pay | Admitting: *Deleted

## 2024-01-14 DIAGNOSIS — C50212 Malignant neoplasm of upper-inner quadrant of left female breast: Secondary | ICD-10-CM | POA: Insufficient documentation

## 2024-01-15 NOTE — Progress Notes (Signed)
 Radiation Oncology         [phone] ________________________________  Initial Outpatient Consultation  Name: Stephanie Neal MRN: [id number]  Date: 01/16/2024  DOB: 04/14/1956  RR:Gnywdnw, Barnie NOVAK, MD  Ebbie Cough, MD   REFERRING PHYSICIAN: Ebbie Cough, MD  DIAGNOSIS: No diagnosis found.   Cancer Staging  No matching staging information was found for the patient.   Stage *** Left Breast UIQ (Two Masses), Invasive and in situ lobular carcinoma, ER+ / PR+ / Her2-, Grade 2  CHIEF COMPLAINT: Here to discuss management of left breast cancer  HISTORY OF PRESENT ILLNESS::Stephanie Neal is a 68 y.o. female who presented with a left breast abnormality on the following imaging: bilateral screening mammogram on the date of 11/16/23. No symptoms, if any, were reported at that time. She accordingly presented for a left breast diagnostic mammogram and left breast ultrasound on 12/04/23 that further revealed a highly suspicious 0.9 cm mass in the 9:30 o'clock left breast located 4 cmfn. No evidence of left axillary lymphadenopathy was demonstrated.   She accordingly presented for biopsies of the left breast on 12/04/23. On pre-procedure imaging that day (which consisted of a left breast ultrasound), an additional mass was identified in the 11 o'clock left breast, measuring 1.3 cm in the greatest extent, and located 2 cmfn. Both left breast masses were biopsied (pathology results detailed as followed).  -- Biopsy of the 9:30 o'clock left breast mass showed grade 2 invasive lobular carcinoma measuring 9 mm in the greatest linear extent of the sample with focal low-grade MCIS/LCIS and calcifications present. ER status: 100% positive and PR status 25% positive, both with strong staining intensity; Proliferation marker Ki67 at 10%; Her2 status negative; Grade 2. -- Biopsy of the 11 o'clock left breast mass also showed invasive lobular carcinoma measuring 9 mm in the  greatest linear extent of the sample with MCIS/LCIS. Pathology notes indicate that the 11 o'clock left breast mass has similar morphologic features to the 9:30 o'clock mass.  -- No lymph nodes were examined.   ***  PREVIOUS RADIATION THERAPY: No  PAST MEDICAL HISTORY:  has a past medical history of Diabetes mellitus, Hyperlipidemia, and Hypertension.    PAST SURGICAL HISTORY: Past Surgical History:  Procedure Laterality Date   BREAST BIOPSY Left 01/08/2024   US  LT BREAST BX W LOC DEV 1ST LESION IMG BX SPEC US  GUIDE 01/08/2024 [facility]   BREAST BIOPSY Left 01/08/2024   US  LT BREAST BX W LOC DEV EA ADD LESION IMG BX SPEC US  GUIDE 01/08/2024 [facility]   FOOT SURGERY     MICROLARYNGOSCOPY N/A 01/18/2022   Procedure: MICROLARYNGOSCOPY WITH EXCISION OF POLYPS;  Surgeon: Jesus Oliphant, MD;  Location: [facility] OR;  Service: ENT;  Laterality: N/A;    FAMILY HISTORY: family history includes COPD in her father; Diabetes in her father and mother; Heart disease in her father; Hypertension in her mother.  SOCIAL HISTORY:  reports that she has quit smoking. She has never used smokeless tobacco. She reports that she does not drink alcohol and does not use drugs.  ALLERGIES: Penicillins  MEDICATIONS:  Current Outpatient Medications  Medication Sig Dispense Refill   Accu-Chek Softclix Lancets lancets Use as instructed to check blood sugar once a day. E11.9 100 each 6   albuterol  (VENTOLIN  HFA) 108 (90 Base) MCG/ACT inhaler Inhale 2 puffs into the lungs every 4 (four) hours as needed for wheezing or shortness of breath. 1 each 0   amLODipine  (NORVASC ) 10 MG tablet TAKE  1 TABLET BY MOUTH EVERY DAY 90 tablet 1   atorvastatin  (LIPITOR) 80 MG tablet Take 1 tablet (80 mg total) by mouth daily. Discontinue the 40 mg Atorvastatin  90 tablet 1   Blood Glucose Monitoring Suppl (ACCU-CHEK GUIDE ME) w/Device KIT Use as directed 1 kit 0   Blood Pressure Monitoring (BLOOD PRESSURE CUFF) MISC Use to check blood  pressure once daily. 1 each 0   empagliflozin  (JARDIANCE ) 10 MG TABS tablet Take 1 tablet (10 mg total) by mouth daily before breakfast. 30 tablet 5   fluticasone  (FLONASE ) 50 MCG/ACT nasal spray Place 1 spray into both nostrils daily. 16 g 6   glimepiride  (AMARYL ) 4 MG tablet Take 1 tablet (4 mg total) by mouth 2 (two) times daily. 180 tablet 1   glucose blood (ACCU-CHEK GUIDE TEST) test strip Use as instructed to check blood sugar once a day. E11.9 100 each 6   lisinopril  (ZESTRIL ) 30 MG tablet Take 1 tablet (30 mg total) by mouth daily. 90 tablet 1   metFORMIN  (GLUCOPHAGE ) 1000 MG tablet TAKE 1 TABLET BY MOUTH 2 (TWO) TIMES DAILY WITH A MEAL. FOR DIABETES 180 tablet 1   No current facility-administered medications for this encounter.    REVIEW OF SYSTEMS: As above in HPI.   PHYSICAL EXAM:  vitals were not taken for this visit.   General: Alert and oriented, in no acute distress HEENT: Head is normocephalic. Extraocular movements are intact.  Heart: Regular in rate and rhythm with no murmurs, rubs, or gallops. Chest: Clear to auscultation bilaterally, with no rhonchi, wheezes, or rales. Abdomen: Soft, nontender, nondistended, with no rigidity or guarding. Extremities: No cyanosis or edema. Skin: No concerning lesions. Musculoskeletal: symmetric strength and muscle tone throughout. Neurologic: Cranial nerves II through XII are grossly intact. No obvious focalities. Speech is fluent. Coordination is intact. Psychiatric: Judgment and insight are intact. Affect is appropriate. Breasts: *** . No other palpable masses appreciated in the breasts or axillae *** .    ECOG = ***  0 - Asymptomatic (Fully active, able to carry on all predisease activities without restriction)  1 - Symptomatic but completely ambulatory (Restricted in physically strenuous activity but ambulatory and able to carry out work of a light or sedentary nature. For example, light housework, office work)  2 -  Symptomatic, <50% in bed during the day (Ambulatory and capable of all self care but unable to carry out any work activities. Up and about more than 50% of waking hours)  3 - Symptomatic, >50% in bed, but not bedbound (Capable of only limited self-care, confined to bed or chair 50% or more of waking hours)  4 - Bedbound (Completely disabled. Cannot carry on any self-care. Totally confined to bed or chair)  5 - Death   Raylene MM, Creech RH, Tormey DC, et al. [phone]). Toxicity and response criteria of the [facility]. Am. DOROTHA Bridges. Oncol. 5 (6): 649-55   LABORATORY DATA:   CBC    Component Value Date/Time   WBC 4.2 02/08/2023 1159   WBC 4.9 11/18/2021 1013   RBC 4.97 02/08/2023 1159   RBC 5.13 (H) 11/18/2021 1013   HGB 13.6 02/08/2023 1159   HCT 40.9 02/08/2023 1159   PLT 240 02/08/2023 1159   MCV 82 02/08/2023 1159   MCH 27.4 02/08/2023 1159   MCH 27.5 11/18/2021 1013   MCHC 33.3 02/08/2023 1159   MCHC 33.9 11/18/2021 1013   RDW 14.4 02/08/2023 1159   LYMPHSABS 1.2 12/21/2021 1105  MONOABS 0.5 11/18/2021 1013   EOSABS 0.1 12/21/2021 1105   BASOSABS 0.0 12/21/2021 1105    CMP     Component Value Date/Time   NA 142 02/08/2023 1159   K 4.1 02/08/2023 1159   CL 103 02/08/2023 1159   CO2 24 02/08/2023 1159   GLUCOSE 173 (H) 02/08/2023 1159   GLUCOSE 125 (H) 11/18/2021 1013   BUN 8 02/08/2023 1159   CREATININE 0.78 02/08/2023 1159   CREATININE 1.11 (H) 04/04/2016 1113   CALCIUM  9.3 02/08/2023 1159   PROT 6.4 02/08/2023 1159   ALBUMIN 4.1 02/08/2023 1159   AST 15 02/08/2023 1159   ALT 9 02/08/2023 1159   ALKPHOS 77 02/08/2023 1159   BILITOT <0.2 02/08/2023 1159   EGFR 83 02/08/2023 1159   GFRNONAA >60 11/18/2021 1013   GFRNONAA 54 (L) 04/04/2016 1113      RADIOGRAPHY: US  LT BREAST BX W LOC DEV 1ST LESION IMG BX SPEC US  GUIDE Addendum Date: 01/09/2024 ADDENDUM REPORT: 01/09/2024 13:12 ADDENDUM: PATHOLOGY revealed: 1. Breast, left, needle core  biopsy, 9:30 4 cmfn, ribbon clip - INVASIVE MAMMARY CARCINOMA, AN E-CADHERIN STAIN IS PENDING AND WILL BE REPORTED IN AN ADDENDUM - FOCAL MAMMARY CARCINOMA IN SITU, OVERALL GRADE: II/III - CANCER LENGTH: 9 MM IN GREATEST LINEAR DIMENSION ON FRAGMENTED CORES - CALCIFICATIONS: PRESENT. Pathology results are CONCORDANT with imaging findings, per Dr. Chyrl Phi. PATHOLOGY revealed: 2. Breast, left, needle core biopsy, 11:00 2 cmfn coil clip - INVASIVE MAMMARY CARCINOMA AND MAMMARY CARCINOMA IN SITU WITH SIMILAR MORPHOLOGIC FEATURES AS ABOVE - 9 MM IN GREATEST LINEAR DIMENSION - NOTE: GIVEN THE SIMILAR MORPHOLOGIC FEATURES, THE BREAST PROGNOSTIC PANEL IS PERFORMED ONLY ON PART 1; HOWEVER, IF CLINICALLY INDICATED IT CAN BE ADDED AT THE CLINICIANS DISCRETION. Pathology results are CONCORDANT with imaging findings, per Dr. Chyrl Phi. Pathology results and recommendations were discussed with patient via telephone on 01/09/2024 by Jacquline Cooter RN. Patient reported biopsy site doing well with no adverse symptoms, and slight tenderness at the site. Post biopsy care instructions were reviewed, questions were answered and my direct phone number was provided. Patient was instructed to call [facility] for any additional questions or concerns related to biopsy site. RECOMMENDATIONS: 1. Surgical and oncological consultation. Patient was referred to the [facility] at [facility] with appointment on 01/16/2024. Pathology results reported by Jacquline Cooter RN on 01/09/2024. Electronically Signed   By: Reyes Phi M.D.   On: 01/09/2024 13:12   Result Date: 01/09/2024 CLINICAL DATA:  68 year old female presents for ultrasound guided biopsy of 0.9 cm 9:30 position LEFT breast mass. On preprocedure imaging today, an additional 1.3 x 0.9 x 1.3 cm irregular hypoechoic mass at the 11 o'clock position of the LEFT breast 2 cm from the nipple was identified.  Sampling of this mass was also performed today. EXAM: ULTRASOUND GUIDED LEFT BREAST CORE NEEDLE BIOPSY X 2 COMPARISON:  Previous exam(s). PROCEDURE: I met with the patient and we discussed the procedure of ultrasound-guided biopsy, including benefits and alternatives. We discussed the high likelihood of a successful procedure. We discussed the risks of the procedure, including infection, bleeding, tissue injury, clip migration, and inadequate sampling. Informed written consent was given. The usual time-out protocol was performed immediately prior to the procedure. ULTRASOUND GUIDED LEFT BREAST CORE NEEDLE BIOPSY #1 (0.9 cm 9:30 position mass-RIBBON clip): Lesion quadrant: UPPER INNER LEFT breast Using sterile technique and 1% Lidocaine  with and without epinephrine  as local anesthetic,  under direct ultrasound visualization, a 12 gauge spring-loaded device was used to perform biopsy of the 0.9 cm 9:30 position LEFT breast mass using a MEDIAL approach. At the conclusion of the procedure a RIBBON shaped tissue marker clip was deployed into the biopsy cavity. Follow up 2 view mammogram was performed and dictated separately. ULTRASOUND GUIDED LEFT BREAST CORE NEEDLE BIOPSY #2 (1.3 cm 11 o'clock position mass-COIL clip): Lesion quadrant: UPPER INNER LEFT breast Using sterile technique and 1% Lidocaine  with and without epinephrine  as local anesthetic, under direct ultrasound visualization, a 12 gauge spring-loaded device was used to perform biopsy of the 1.3 cm 11 o'clock position LEFT breast mass using a MEDIAL approach. At the conclusion of the procedure a COIL shaped tissue marker clip was deployed into the biopsy cavity. Follow up 2 view mammogram was performed and dictated separately. IMPRESSION: Ultrasound guided biopsy of 0.9 cm 9:30 position LEFT breast mass (RIBBON clip). Ultrasound-guided biopsy of 1.3 cm 11 o'clock position LEFT breast mass (COIL clip). No apparent complications. Electronically Signed: By:  Reyes Phi M.D. On: 01/08/2024 11:28   US  LT BREAST BX W LOC DEV EA ADD LESION IMG BX SPEC US  GUIDE Addendum Date: 01/09/2024 ADDENDUM REPORT: 01/09/2024 13:12 ADDENDUM: PATHOLOGY revealed: 1. Breast, left, needle core biopsy, 9:30 4 cmfn, ribbon clip - INVASIVE MAMMARY CARCINOMA, AN E-CADHERIN STAIN IS PENDING AND WILL BE REPORTED IN AN ADDENDUM - FOCAL MAMMARY CARCINOMA IN SITU, OVERALL GRADE: II/III - CANCER LENGTH: 9 MM IN GREATEST LINEAR DIMENSION ON FRAGMENTED CORES - CALCIFICATIONS: PRESENT. Pathology results are CONCORDANT with imaging findings, per Dr. Chyrl Phi. PATHOLOGY revealed: 2. Breast, left, needle core biopsy, 11:00 2 cmfn coil clip - INVASIVE MAMMARY CARCINOMA AND MAMMARY CARCINOMA IN SITU WITH SIMILAR MORPHOLOGIC FEATURES AS ABOVE - 9 MM IN GREATEST LINEAR DIMENSION - NOTE: GIVEN THE SIMILAR MORPHOLOGIC FEATURES, THE BREAST PROGNOSTIC PANEL IS PERFORMED ONLY ON PART 1; HOWEVER, IF CLINICALLY INDICATED IT CAN BE ADDED AT THE CLINICIANS DISCRETION. Pathology results are CONCORDANT with imaging findings, per Dr. Chyrl Phi. Pathology results and recommendations were discussed with patient via telephone on 01/09/2024 by Jacquline Cooter RN. Patient reported biopsy site doing well with no adverse symptoms, and slight tenderness at the site. Post biopsy care instructions were reviewed, questions were answered and my direct phone number was provided. Patient was instructed to call [facility] for any additional questions or concerns related to biopsy site. RECOMMENDATIONS: 1. Surgical and oncological consultation. Patient was referred to the [facility] at [facility] with appointment on 01/16/2024. Pathology results reported by Jacquline Cooter RN on 01/09/2024. Electronically Signed   By: Reyes Phi M.D.   On: 01/09/2024 13:12   Result Date: 01/09/2024 CLINICAL DATA:  68 year old female presents for ultrasound guided  biopsy of 0.9 cm 9:30 position LEFT breast mass. On preprocedure imaging today, an additional 1.3 x 0.9 x 1.3 cm irregular hypoechoic mass at the 11 o'clock position of the LEFT breast 2 cm from the nipple was identified. Sampling of this mass was also performed today. EXAM: ULTRASOUND GUIDED LEFT BREAST CORE NEEDLE BIOPSY X 2 COMPARISON:  Previous exam(s). PROCEDURE: I met with the patient and we discussed the procedure of ultrasound-guided biopsy, including benefits and alternatives. We discussed the high likelihood of a successful procedure. We discussed the risks of the procedure, including infection, bleeding, tissue injury, clip migration, and inadequate sampling. Informed written consent was given. The usual time-out protocol was performed immediately  prior to the procedure. ULTRASOUND GUIDED LEFT BREAST CORE NEEDLE BIOPSY #1 (0.9 cm 9:30 position mass-RIBBON clip): Lesion quadrant: UPPER INNER LEFT breast Using sterile technique and 1% Lidocaine  with and without epinephrine  as local anesthetic, under direct ultrasound visualization, a 12 gauge spring-loaded device was used to perform biopsy of the 0.9 cm 9:30 position LEFT breast mass using a MEDIAL approach. At the conclusion of the procedure a RIBBON shaped tissue marker clip was deployed into the biopsy cavity. Follow up 2 view mammogram was performed and dictated separately. ULTRASOUND GUIDED LEFT BREAST CORE NEEDLE BIOPSY #2 (1.3 cm 11 o'clock position mass-COIL clip): Lesion quadrant: UPPER INNER LEFT breast Using sterile technique and 1% Lidocaine  with and without epinephrine  as local anesthetic, under direct ultrasound visualization, a 12 gauge spring-loaded device was used to perform biopsy of the 1.3 cm 11 o'clock position LEFT breast mass using a MEDIAL approach. At the conclusion of the procedure a COIL shaped tissue marker clip was deployed into the biopsy cavity. Follow up 2 view mammogram was performed and dictated separately. IMPRESSION:  Ultrasound guided biopsy of 0.9 cm 9:30 position LEFT breast mass (RIBBON clip). Ultrasound-guided biopsy of 1.3 cm 11 o'clock position LEFT breast mass (COIL clip). No apparent complications. Electronically Signed: By: Reyes Phi M.D. On: 01/08/2024 11:28   MM CLIP PLACEMENT LEFT Result Date: 01/08/2024 CLINICAL DATA:  Evaluate placement of biopsy clips following ultrasound-guided LEFT breast biopsies. EXAM: 3D DIAGNOSTIC LEFT MAMMOGRAM POST ULTRASOUND BIOPSY COMPARISON:  Previous exam(s). ACR Breast Density Category c: The breasts are heterogeneously dense, which may obscure small masses. FINDINGS: 3D Mammographic images were obtained following ultrasound guided biopsy of the 0.9 cm 9:30 position LEFT breast mass (RIBBON clip) and the 1.3 cm 11 o'clock position LEFT breast mass (COIL clip). The RIBBON biopsy marking clip is in expected position at the site of biopsy. The COIL biopsy marking clip is in expected position at the site of biopsy. The 2 clips are separated by a distance of 2.5 cm IMPRESSION: Appropriate positioning of the RIBBON shaped biopsy marking clip at the site of biopsy in the INNER LEFT breast. Appropriate positioning of the COIL shaped biopsy marking clip at the site of biopsy in the INNER LEFT breast. Final Assessment: Post Procedure Mammograms for Marker Placement Electronically Signed   By: Reyes Phi M.D.   On: 01/08/2024 11:43      IMPRESSION/PLAN: ***   It was a pleasure meeting the patient today. We discussed the risks, benefits, and side effects of radiotherapy. I recommend radiotherapy to the *** to reduce her risk of locoregional recurrence by 2/3.  We discussed that radiation would take approximately *** weeks to complete and that I would give the patient a few weeks to heal following surgery before starting treatment planning. *** If chemotherapy were to be given, this would precede radiotherapy. We spoke about acute effects including skin irritation and fatigue as well as  much less common late effects including internal organ injury or irritation. We spoke about the latest technology that is used to minimize the risk of late effects for patients undergoing radiotherapy to the breast or chest wall. No guarantees of treatment were given. The patient is enthusiastic about proceeding with treatment. I look forward to participating in the patient's care.  I will await her referral back to me for postoperative follow-up and eventual CT simulation/treatment planning.  On date of service, in total, I spent *** minutes on this encounter. Patient was seen in person.  __________________________________________   Lauraine Golden, MD  This document serves as a record of services personally performed by Lauraine Golden, MD. It was created on her behalf by Dorthy Fuse, a trained medical scribe. The creation of this record is based on the scribe's personal observations and the provider's statements to them. This document has been checked and approved by the attending provider.

## 2024-01-16 ENCOUNTER — Ambulatory Visit: Attending: General Surgery | Admitting: Physical Therapy

## 2024-01-16 ENCOUNTER — Ambulatory Visit
Admission: RE | Admit: 2024-01-16 | Discharge: 2024-01-16 | Disposition: A | Discharge status: Home / Self Care | Source: Ambulatory Visit | Attending: Radiation Oncology | Admitting: Radiation Oncology

## 2024-01-16 ENCOUNTER — Ambulatory Visit

## 2024-01-16 ENCOUNTER — Encounter: Payer: Self-pay | Admitting: General Practice

## 2024-01-16 ENCOUNTER — Encounter: Payer: Self-pay | Admitting: *Deleted

## 2024-01-16 ENCOUNTER — Ambulatory Visit: Payer: Self-pay | Admitting: Internal Medicine

## 2024-01-16 ENCOUNTER — Other Ambulatory Visit: Payer: Self-pay

## 2024-01-16 ENCOUNTER — Encounter: Payer: Self-pay | Admitting: Physical Therapy

## 2024-01-16 ENCOUNTER — Inpatient Hospital Stay: Admitting: Hematology and Oncology

## 2024-01-16 ENCOUNTER — Inpatient Hospital Stay: Attending: Hematology and Oncology

## 2024-01-16 VITALS — BP 136/60 | HR 101 | Temp 97.6°F | Resp 16 | Wt 174.7 lb

## 2024-01-16 DIAGNOSIS — Z170 Estrogen receptor positive status [ER+]: Secondary | ICD-10-CM

## 2024-01-16 DIAGNOSIS — C50212 Malignant neoplasm of upper-inner quadrant of left female breast: Secondary | ICD-10-CM

## 2024-01-16 DIAGNOSIS — Z87891 Personal history of nicotine dependence: Secondary | ICD-10-CM | POA: Insufficient documentation

## 2024-01-16 DIAGNOSIS — R293 Abnormal posture: Secondary | ICD-10-CM | POA: Insufficient documentation

## 2024-01-16 LAB — CMP (CANCER CENTER ONLY)
ALT: 9 U/L (ref 0–44)
AST: 10 U/L — ABNORMAL LOW (ref 15–41)
Albumin: 3.8 g/dL (ref 3.5–5.0)
Alkaline Phosphatase: 59 U/L (ref 38–126)
Anion gap: 5 (ref 5–15)
BUN: 9 mg/dL (ref 8–23)
CO2: 31 mmol/L (ref 22–32)
Calcium: 8.9 mg/dL (ref 8.9–10.3)
Chloride: 105 mmol/L (ref 98–111)
Creatinine: 0.88 mg/dL (ref 0.44–1.00)
GFR, Estimated: >60 mL/min (ref >60)
Glucose, Bld: 159 mg/dL — ABNORMAL HIGH (ref 70–99)
Potassium: 3.5 mmol/L (ref 3.5–5.1)
Sodium: 141 mmol/L (ref 135–145)
Total Bilirubin: 0.4 mg/dL (ref 0.0–1.2)
Total Protein: 6.7 g/dL (ref 6.5–8.1)

## 2024-01-16 LAB — CBC WITH DIFFERENTIAL (CANCER CENTER ONLY)
Abs Immature Granulocytes: 0.02 K/uL (ref 0.00–0.07)
Basophils Absolute: 0 K/uL (ref 0.0–0.1)
Basophils Relative: 0 %
Eosinophils Absolute: 0.1 K/uL (ref 0.0–0.5)
Eosinophils Relative: 1 %
HCT: 36.6 % (ref 36.0–46.0)
Hemoglobin: 12.7 g/dL (ref 12.0–15.0)
Immature Granulocytes: 0 %
Lymphocytes Relative: 22 %
Lymphs Abs: 1.3 K/uL (ref 0.7–4.0)
MCH: 27.5 pg (ref 26.0–34.0)
MCHC: 34.7 g/dL (ref 30.0–36.0)
MCV: 79.2 fL — ABNORMAL LOW (ref 80.0–100.0)
Monocytes Absolute: 0.5 K/uL (ref 0.1–1.0)
Monocytes Relative: 8 %
Neutro Abs: 4.3 K/uL (ref 1.7–7.7)
Neutrophils Relative %: 69 %
Platelet Count: 221 K/uL (ref 150–400)
RBC: 4.62 MIL/uL (ref 3.87–5.11)
RDW: 14.3 % (ref 11.5–15.5)
WBC Count: 6.2 K/uL (ref 4.0–10.5)
nRBC: 0 % (ref 0.0–0.2)

## 2024-01-16 LAB — GENETIC SCREENING ORDER

## 2024-01-16 NOTE — Research (Signed)
 [employer] 2021-05 - Specimen Collection Study to Evaluate Biomarkers in Subjects with Cancer     Patient Stephanie Neal was identified by Dr. [name] as a potential candidate for the above listed study.  This Clinical Research Coordinator met with Vinita Prentiss, [id number], on 01/16/24 in a manner and location that ensures patient privacy to discuss participation in the above listed research study.  Patient is Accompanied by 3 family members.  A copy of the informed consent document with embedded HIPAA language was provided to the patient.  Patient reads, speaks, and understands [place].   Patient was provided with the business card of this Coordinator and encouraged to contact the research team with any questions.  Approximately 10 minutes were spent with the patient reviewing the informed consent documents.  Patient was provided the option of taking informed consent documents home to review and was encouraged to review at their convenience with their support network, including other care providers. Patient is comfortable with making a decision regarding study participation today. Patient declined participation. Patient mentioned does not feel comfortable giving blood. Dr. Odean notified.  Laury Quale, MPH  Clinical Research Coordinator

## 2024-01-16 NOTE — Progress Notes (Unsigned)
 REFERRING PROVIDER: [name], MD [address] [place],  [place] [address]  PRIMARY PROVIDER:  [name], MD  PRIMARY REASON FOR VISIT:  No diagnosis found.   HISTORY OF PRESENT ILLNESS:   [name], a 68 y.o. female, was seen for a [place] cancer genetics consultation as a part of the [facility] at the request of Dr. Ebbie due to a personal history of breast cancer.  [name] presents to clinic today to discuss the possibility of a hereditary predisposition to cancer, genetic testing, and to further clarify her future cancer risks, as well as potential cancer risks for family members.   In ***, at the age of ***, [name] was diagnosed with {CA PATHOLOGY:63853} of the {right left (wildcard):15202} {CA NMHJW:36145}. The treatment plan ***.    *** [name] is a 68 y.o. female with no personal history of cancer.    CANCER HISTORY:  Oncology History  Malignant neoplasm of upper-inner quadrant of left breast in female, estrogen receptor positive (HCC)  01/08/2024 Initial Diagnosis   Screening mammogram detected left breast distortion LIQ, ultrasound detected mass at 9:30 position: 0.9 cm, second mass at the 11 o'clock position 1.3 cm, axilla negative, biopsy: Grade 2 ILC, no LVI, ER 100%, PR 25%, Ki67 10%, HER2 1+ negative   01/16/2024 Cancer Staging   Staging form: Breast, AJCC 8th Edition - Clinical: Stage IA (cT1c, cN0, cM0, G2, ER+, PR+, HER2-) - Signed by [name], MD on 01/16/2024 Stage prefix: Initial diagnosis Histologic grading system: 3 grade system    RISK FACTORS:  Menarche was at age ***.  First live birth at age ***.  OCP use for approximately {Numbers 1-12 multi-select:20307} years.  Ovaries intact: {Yes/No-Ex:120004}.  Hysterectomy: {Yes/No-Ex:120004}.  Menopausal status: {Menopause:31378}.  HRT use: {Numbers 1-12 multi-select:20307} years. Colonoscopy: {Yes/No-Ex:120004};  {normal/abnormal/not examined:14677}. Mammogram within the last year: {Yes/No-Ex:120004}. Number of breast biopsies: {Numbers 1-12 multi-select:20307}. Up to date with pelvic exams: {Yes/No-Ex:120004}. Any excessive radiation exposure in the past: {Yes/No-Ex:120004}  Past Medical History:  Diagnosis Date   Diabetes mellitus    Hyperlipidemia    Hypertension     Past Surgical History:  Procedure Laterality Date   BREAST BIOPSY Left 01/08/2024   US  LT BREAST BX W LOC DEV 1ST LESION IMG BX SPEC US  GUIDE 01/08/2024 [facility]   BREAST BIOPSY Left 01/08/2024   US  LT BREAST BX W LOC DEV EA ADD LESION IMG BX SPEC US  GUIDE 01/08/2024 [facility]   FOOT SURGERY     MICROLARYNGOSCOPY N/A 01/18/2022   Procedure: MICROLARYNGOSCOPY WITH EXCISION OF POLYPS;  Surgeon: Jesus Oliphant, MD;  Location: [facility] OR;  Service: ENT;  Laterality: N/A;    Social History   Socioeconomic History   Marital status: Single    Spouse name: Not on file   Number of children: Not on file   Years of education: Not on file   Highest education level: 12th grade  Occupational History   Not on file  Tobacco Use   Smoking status: Former   Smokeless tobacco: Never  Vaping Use   Vaping status: Never Used  Substance and Sexual Activity   Alcohol use: No   Drug use: No   Sexual activity: Not on file  Other Topics Concern   Not on file  Social History Narrative   Not on file   Social Drivers of Health   Financial Resource Strain: Low Risk  (11/08/2023)   Overall Financial Resource  Strain (CARDIA)    Difficulty of Paying Living Expenses: Not hard at all  Food Insecurity: No Food Insecurity (01/16/2024)   Hunger Vital Sign    Worried About Running Out of Food in the Last Year: Never true    Ran Out of Food in the Last Year: Never true  Transportation Needs: No Transportation Needs (01/16/2024)   PRAPARE - Administrator, Civil Service (Medical): No    Lack of Transportation (Non-Medical): No   Physical Activity: Insufficiently Active (11/08/2023)   Exercise Vital Sign    Days of Exercise per Week: 1 day    Minutes of Exercise per Session: 30 min  Stress: No Stress Concern Present (11/08/2023)   [employer] of Occupational Health - Occupational Stress Questionnaire    Feeling of Stress: Not at all  Social Connections: Moderately Isolated (11/08/2023)   Social Connection and Isolation Panel    Frequency of Communication with Friends and Family: Twice a week    Frequency of Social Gatherings with Friends and Family: Three times a week    Attends Religious Services: 1 to 4 times per year    Active Member of Clubs or Organizations: No    Attends Engineer, structural: Not on file    Marital Status: Divorced     FAMILY HISTORY:  We obtained a detailed, 4-generation family history.  Significant diagnoses are listed below: Family History  Problem Relation Age of Onset   Hypertension Mother    Diabetes Mother    COPD Father    Heart disease Father    Diabetes Father     Ms. Eilts is {aware/unaware} of previous family history of genetic testing for hereditary cancer risks. Patient's maternal ancestors are of *** descent, and paternal ancestors are of *** descent. There {IS NO:12509} reported Ashkenazi Jewish ancestry. There {IS NO:12509} known consanguinity.  GENETIC COUNSELING ASSESSMENT: [name] is a 68 y.o. female with a {Personal/family:20331} history of {cancer/polyps} which is somewhat suggestive of a {DISEASE} and predisposition to cancer given ***. We, therefore, discussed and recommended the following at today's visit.   DISCUSSION: We discussed that, in general, most cancer is not inherited in families, but instead is sporadic or familial. Sporadic cancers occur by chance and typically happen at older ages (>50 years) as this type of cancer is caused by genetic changes acquired during an individual's lifetime. Some families have more cancers than would be  expected by chance; however, the ages or types of cancer are not consistent with a known genetic mutation or known genetic mutations have been ruled out. This type of familial cancer is thought to be due to a combination of multiple genetic, environmental, hormonal, and lifestyle factors. While this combination of factors likely increases the risk of cancer, the exact source of this risk is not currently identifiable or testable.  We discussed that *** - ***% of *** is hereditary, with most cases associated with ***.  There are other genes that can be associated with hereditary *** cancer syndromes.  These include ***.  We discussed that testing is beneficial for several reasons including knowing how to follow individuals after completing their treatment, identifying whether potential treatment options such as PARP inhibitors would be beneficial, and understand if other family members could be at risk for cancer and allow them to undergo genetic testing.   We reviewed the characteristics, features and inheritance patterns of hereditary cancer syndromes. We also discussed genetic testing, including the appropriate family members to test, the  process of testing, insurance coverage and turn-around-time for results. We discussed the implications of a negative, positive, carrier and/or variant of uncertain significant result. [name]  was offered a common hereditary cancer panel (36+ genes) and an expanded pan-cancer panel (70+ genes). [name] was informed of the benefits and limitations of each panel, including that expanded pan-cancer panels contain genes that do not have clear management guidelines at this point in time.  We also discussed that as the number of genes included on a panel increases, the chances of variants of uncertain significance increases. [name] decided to pursue genetic testing for the *** gene panel.   Based on Ms. Seder's {Personal/family:20331} history of cancer, she meets  medical criteria for genetic testing. ***Though [name] is not personally affected, there are no affected family members that are willing/able/available to undergo hereditary cancer testing.  Therefore, [name] the most informative family member available.  Despite that she meets criteria, she may still have an out of pocket cost. We discussed that if her out of pocket cost for testing is over $100, the laboratory will call and confirm whether she wants to proceed with testing.  If the out of pocket cost of testing is less than $100 she will be billed by the genetic testing laboratory.   We discussed that some people do not want to undergo genetic testing due to fear of genetic discrimination.  The Genetic Information Nondiscrimination Act (GINA) was signed into federal law in 2008. GINA prohibits health insurers and most employers from discriminating against individuals based on genetic information (including the results of genetic tests and family history information). According to GINA, health insurance companies cannot consider genetic information to be a preexisting condition, nor can they use it to make decisions regarding coverage or rates. GINA also makes it illegal for most employers to use genetic information in making decisions about hiring, firing, promotion, or terms of employment. It is important to note that GINA does not offer protections for life insurance, disability insurance, or long-term care insurance. GINA does not apply to those in the [employer], those who work for companies with less than 15 employees, and new life insurance or long-term disability insurance policies.  Health status due to a cancer diagnosis is not protected under GINA. More information about GINA can be found by visiting [email or web address].  ***We reviewed the characteristics, features and inheritance patterns of hereditary cancer syndromes. We also discussed genetic testing, including the appropriate family  members to test, the process of testing, insurance coverage and turn-around-time for results. We discussed the implications of a negative, positive and/or variant of uncertain significant result. In order to get genetic test results in a timely manner so that Ms. Bicking can use these genetic test results for surgical decisions, we recommended [name] pursue genetic testing for the ***. Once complete, we recommend [name] pursue reflex genetic testing to the *** gene panel.   Based on Ms. Pendergraph's {Personal/family:20331} history of cancer, she meets medical criteria for genetic testing. Despite that she meets criteria, she may still have an out of pocket cost.   ***We discussed with [name] that the {Personal/family:20331} history does not meet insurance or NCCN criteria for genetic testing and, therefore, is not highly consistent with a familial hereditary cancer syndrome.  We feel she is at low risk to harbor  a gene mutation associated with such a condition. Thus, we did not recommend any genetic testing, at this time, and recommended [name] continue to  follow the cancer screening guidelines given by her primary healthcare provider.  ***In order to estimate her chance of having a {CA GENE:62345} mutation, we used statistical models ({GENMODELS:62370}) that consider her personal medical history, family history and ancestry.  Because each model is different, there can be a lot of variability in the risks they give.  Therefore, these numbers must be considered a rough range and not a precise risk of having a {CA GENE:62345} mutation.  These models estimate that she has approximately a ***-***% chance of having a mutation. Based on this assessment of her family and personal history, genetic testing {IS/ISNOT:34056} recommended.  ***The Tyrer-Cuzick model is one of multiple prediction models developed to estimate an individual's lifetime risk of developing breast cancer. The Tyrer-Cuzick model  is endorsed by the [employer] (NCCN). This model includes many risk factors such as family history, endogenous estrogen exposure, and benign breast disease. The calculation is highly-dependent on the accuracy of clinical data provided by the patient and can change over time. The Tyrer-Cuzick model may be repeated to reflect new information in her personal or family history in the future.   ***Based on the patient's {Personal/family:20331} history, a statistical model ({GENMODELS:62370}) was used to estimate her risk of developing {CA HX:54794}. This estimates her lifetime risk of developing {CA HX:54794} to be approximately ***%. This estimation does not consider any genetic testing results.  The patient's lifetime breast cancer risk is a preliminary estimate based on available information using one of several models endorsed by the American Cancer Society (ACS). The ACS recommends consideration of breast MRI screening as an adjunct to mammography for patients at high risk (defined as 20% or greater lifetime risk). Please note that a woman's breast cancer risk changes over time. It may increase or decrease based on age and any changes to the personal and/or family medical history. The risks and recommendations listed above apply to this patient at this point in time. In the future, she may or may not be eligible for the same medical management strategies and, in some cases, other medical management strategies may become available to her. If she is interested in an updated breast cancer risk assessment at a later date, she can contact us .  ***[name] has been determined to be at high risk for breast cancer.  her Tyrer-Cuzick risk score is ***%.  For women with a greater than 20% lifetime risk of breast cancer, the [employer] (NCCN) recommends the following:  1.      Clinical encounter every 6-12 months to begin when identified as being at increased risk,  but not before age 76  2.      Annual mammograms. Tomosynthesis is recommended starting 10 years earlier than the youngest breast cancer diagnosis in the family or at age 50 (whichever comes first), but not before age 46   54.      Annual breast MRI starting 10 years earlier than the youngest breast cancer diagnosis in the family or at age 83 (whichever comes first), but not before age 60.    PLAN: After considering the risks, benefits, and limitations, [name] provided informed consent to pursue genetic testing and the blood sample was sent to {Lab} Laboratories for analysis of the {test}. Results should be available within approximately {TAT TIME} weeks' time, at which point they will be disclosed by telephone to [name], as will any additional recommendations warranted by these results. [name] will receive a summary of her  genetic counseling visit and a copy of her results once available. This information will also be available in Epic.   *** Despite our recommendation, [name] did not wish to pursue genetic testing at today's visit. We understand this decision and remain available to coordinate genetic testing at any time in the future. We, therefore, recommend [name] continue to follow the cancer screening guidelines given by her primary healthcare provider.  ***Based on [name]'s family history, we recommended her ***, who was diagnosed with *** at age ***, have genetic counseling and testing. [name] will let us  know if we can be of any assistance in coordinating genetic counseling and/or testing for this family member.   Lastly, we encouraged [name] to remain in contact with cancer genetics annually so that we can continuously update the family history and inform her of any changes in cancer genetics and testing that may be of benefit for this family.   [name] questions were answered to her satisfaction today. Our contact information was provided should  additional questions or concerns arise. Thank you for the referral and allowing us  to share in the care of your patient.   Karen P. Perri, MS, CGC Licensed, Patent attorney [email or web address] phone: [phone]  In total, *** minutes were spent on the date of the encounter in service to the patient including preparation, face-to-face consultation, documentation and care coordination.  *** The patient was seen alone.  ***The patient brought ***. Drs. Lanny Stalls, and/or [name] were available for questions, if needed..    _______________________________________________________________________ For Office Staff:  Number of people involved in session: 4 Was an Intern/ student involved with case: no

## 2024-01-16 NOTE — Progress Notes (Signed)
 [facility] CONSULT NOTE  Patient Care Team: [name], MD as PCP - General (Internal Medicine) Gerome, Devere HERO, RN as Oncology Nurse Navigator Tyree Nanetta SAILOR, RN as Oncology Nurse Navigator Ebbie Cough, MD as Consulting Physician (General Surgery) [name], MD as Consulting Physician (Hematology and Oncology) Izell Domino, MD as Attending Physician (Radiation Oncology)  CHIEF COMPLAINTS/PURPOSE OF CONSULTATION:  Newly diagnosed breast cancer  HISTORY OF PRESENTING ILLNESS: Ms. Mcmeekin is a 68 year old who had a screening mammogram detected left breast distortion lower inner quadrant by ultrasound there was a mass measuring 0.9 cm at 9:30 position.  Incidentally second mass at 11 o'clock position 2 cm from nipple was detected measuring 1.3 cm.  Axilla was negative.  Biopsy of the mass revealed it was grade 2 invasive lobular cancer, no LVI, ER 100%, PR 25%, Ki67 10%, HER2 1+ negative.  She was better this morning at the [facility] and she is here today accompanied by her family to discuss treatment plan.   I reviewed her records extensively and collaborated the history with the patient.  SUMMARY OF ONCOLOGIC HISTORY: Oncology History  Malignant neoplasm of upper-inner quadrant of left breast in female, estrogen receptor positive (HCC)  01/08/2024 Initial Diagnosis   Screening mammogram detected left breast distortion LIQ, ultrasound detected mass at 9:30 position: 0.9 cm, second mass at the 11 o'clock position 1.3 cm, axilla negative, biopsy: Grade 2 ILC, no LVI, ER 100%, PR 25%, Ki67 10%, HER2 1+ negative   01/16/2024 Cancer Staging   Staging form: Breast, AJCC 8th Edition - Clinical: Stage IA (cT1c, cN0, cM0, G2, ER+, PR+, HER2-) - Signed by [name], MD on 01/16/2024 Stage prefix: Initial diagnosis Histologic grading system: 3 grade system      MEDICAL HISTORY:  Past Medical History:  Diagnosis Date   Diabetes mellitus     Hyperlipidemia    Hypertension     SURGICAL HISTORY: Past Surgical History:  Procedure Laterality Date   BREAST BIOPSY Left 01/08/2024   US  LT BREAST BX W LOC DEV 1ST LESION IMG BX SPEC US  GUIDE 01/08/2024 [facility]   BREAST BIOPSY Left 01/08/2024   US  LT BREAST BX W LOC DEV EA ADD LESION IMG BX SPEC US  GUIDE 01/08/2024 [facility]   FOOT SURGERY     MICROLARYNGOSCOPY N/A 01/18/2022   Procedure: MICROLARYNGOSCOPY WITH EXCISION OF POLYPS;  Surgeon: Jesus Oliphant, MD;  Location: [facility] OR;  Service: ENT;  Laterality: N/A;    SOCIAL HISTORY: Social History   Socioeconomic History   Marital status: Single    Spouse name: Not on file   Number of children: Not on file   Years of education: Not on file   Highest education level: 12th grade  Occupational History   Not on file  Tobacco Use   Smoking status: Former   Smokeless tobacco: Never  Vaping Use   Vaping status: Never Used  Substance and Sexual Activity   Alcohol use: No   Drug use: No   Sexual activity: Not on file  Other Topics Concern   Not on file  Social History Narrative   Not on file   Social Drivers of Health   Financial Resource Strain: Low Risk  (11/08/2023)   Overall Financial Resource Strain (CARDIA)    Difficulty of Paying Living Expenses: Not hard at all  Food Insecurity: No Food Insecurity (01/16/2024)   Hunger Vital Sign    Worried About Running Out of Food in the Last Year:  Never true    Ran Out of Food in the Last Year: Never true  Transportation Needs: No Transportation Needs (01/16/2024)   PRAPARE - Administrator, Civil Service (Medical): No    Lack of Transportation (Non-Medical): No  Physical Activity: Insufficiently Active (11/08/2023)   Exercise Vital Sign    Days of Exercise per Week: 1 day    Minutes of Exercise per Session: 30 min  Stress: No Stress Concern Present (11/08/2023)   [employer] of Occupational Health - Occupational Stress Questionnaire    Feeling of  Stress: Not at all  Social Connections: Moderately Isolated (11/08/2023)   Social Connection and Isolation Panel    Frequency of Communication with Friends and Family: Twice a week    Frequency of Social Gatherings with Friends and Family: Three times a week    Attends Religious Services: 1 to 4 times per year    Active Member of Clubs or Organizations: No    Attends Banker Meetings: Not on file    Marital Status: Divorced  Intimate Partner Violence: Not At Risk (01/16/2024)   Humiliation, Afraid, Rape, and Kick questionnaire    Fear of Current or Ex-Partner: No    Emotionally Abused: No    Physically Abused: No    Sexually Abused: No    FAMILY HISTORY: Family History  Problem Relation Age of Onset   Hypertension Mother    Diabetes Mother    COPD Father    Heart disease Father    Diabetes Father     ALLERGIES:  is allergic to penicillins.  MEDICATIONS:  Current Outpatient Medications  Medication Sig Dispense Refill   Accu-Chek Softclix Lancets lancets Use as instructed to check blood sugar once a day. E11.9 100 each 6   albuterol  (VENTOLIN  HFA) 108 (90 Base) MCG/ACT inhaler Inhale 2 puffs into the lungs every 4 (four) hours as needed for wheezing or shortness of breath. 1 each 0   amLODipine  (NORVASC ) 10 MG tablet TAKE 1 TABLET BY MOUTH EVERY DAY 90 tablet 1   atorvastatin  (LIPITOR) 80 MG tablet Take 1 tablet (80 mg total) by mouth daily. Discontinue the 40 mg Atorvastatin  90 tablet 1   Blood Glucose Monitoring Suppl (ACCU-CHEK GUIDE ME) w/Device KIT Use as directed 1 kit 0   Blood Pressure Monitoring (BLOOD PRESSURE CUFF) MISC Use to check blood pressure once daily. 1 each 0   empagliflozin  (JARDIANCE ) 10 MG TABS tablet Take 1 tablet (10 mg total) by mouth daily before breakfast. 30 tablet 5   fluticasone  (FLONASE ) 50 MCG/ACT nasal spray Place 1 spray into both nostrils daily. 16 g 6   glimepiride  (AMARYL ) 4 MG tablet Take 1 tablet (4 mg total) by mouth 2 (two)  times daily. 180 tablet 1   glucose blood (ACCU-CHEK GUIDE TEST) test strip Use as instructed to check blood sugar once a day. E11.9 100 each 6   lisinopril  (ZESTRIL ) 30 MG tablet Take 1 tablet (30 mg total) by mouth daily. 90 tablet 1   metFORMIN  (GLUCOPHAGE ) 1000 MG tablet TAKE 1 TABLET BY MOUTH 2 (TWO) TIMES DAILY WITH A MEAL. FOR DIABETES 180 tablet 1   No current facility-administered medications for this visit.    REVIEW OF SYSTEMS:   Constitutional: Denies fevers, chills or abnormal night sweats Breast:  Denies any palpable lumps or discharge All other systems were reviewed with the patient and are negative.  PHYSICAL EXAMINATION: ECOG PERFORMANCE STATUS: 1 - Symptomatic but completely ambulatory  Vitals:  01/16/24 1229  BP: 136/60  Pulse: (!) 101  Resp: 16  Temp: 97.6 F (36.4 C)  SpO2: 93%   Filed Weights   01/16/24 1229  Weight: 174 lb 11.2 oz (79.2 kg)    GENERAL:alert, no distress and comfortable BREAST: No palpable nodules in breast. No palpable axillary or supraclavicular lymphadenopathy (exam performed in the presence of a chaperone)   LABORATORY DATA:  I have reviewed the data as listed Lab Results  Component Value Date   WBC 6.2 01/16/2024   HGB 12.7 01/16/2024   HCT 36.6 01/16/2024   MCV 79.2 (L) 01/16/2024   PLT 221 01/16/2024   Lab Results  Component Value Date   NA 141 01/16/2024   K 3.5 01/16/2024   CL 105 01/16/2024   CO2 31 01/16/2024    RADIOGRAPHIC STUDIES: I have personally reviewed the radiological reports and agreed with the findings in the report.  ASSESSMENT AND PLAN:  Malignant neoplasm of upper-inner quadrant of left breast in female, estrogen receptor positive (HCC) 01/08/2024:Screening mammogram detected left breast distortion LIQ, ultrasound detected mass at 9:30 position: 0.9 cm, second mass at the 11 o'clock position 1.3 cm, axilla negative, biopsy: Grade 2 ILC, no LVI, ER 100%, PR 25%, Ki67 10%, HER2 1+  negative  Pathology and radiology counseling:Discussed with the patient, the details of pathology including the type of breast cancer,the clinical staging, the significance of ER, PR and HER-2/neu receptors and the implications for treatment. After reviewing the pathology in detail, we proceeded to discuss the different treatment options between surgery, radiation, chemotherapy, antiestrogen therapies.  Breast MRI will be ordered  Recommendations: 1. Breast conserving surgery followed by 2. Oncotype DX testing to determine if chemotherapy would be of any benefit followed by 3. Adjuvant radiation therapy followed by 4. Adjuvant antiestrogen therapy  Oncotype counseling: I discussed Oncotype DX test. I explained to the patient that this is a 21 gene panel to evaluate patient tumors DNA to calculate recurrence score. This would help determine whether patient has high risk or low risk breast cancer. She understands that if her tumor was found to be high risk, she would benefit from systemic chemotherapy. If low risk, no need of chemotherapy.  Return to clinic after surgery to discuss final pathology report and then determine if Oncotype DX testing will need to be sent.      All questions were answered. The patient knows to call the clinic with any problems, questions or concerns.    Viinay K Mandolin Falwell, MD 01/16/24

## 2024-01-16 NOTE — Progress Notes (Signed)
 [facility] Multidisciplinary Clinic Spiritual Care Note  Met with Syrina, son, daughter, and niece, in Breast Multidisciplinary Clinic to introduce Support Center team/resources.  Ms Padgett completed SDOH screening; results follow below.  SDOH Interventions    Flowsheet Row Office Visit from 02/08/2023 in [facility] [place] [facility] [place]. [facility]  SDOH Interventions   Food Insecurity Interventions Intervention Not Indicated  Housing Interventions Intervention Not Indicated  Transportation Interventions Intervention Not Indicated  Alcohol Usage Interventions Intervention Not Indicated (Score <7)  Financial Strain Interventions Intervention Not Indicated  Physical Activity Interventions Intervention Not Indicated  Stress Interventions Intervention Not Indicated  Health Literacy Interventions Intervention Not Indicated    SDOH Screenings   Food Insecurity: No Food Insecurity (01/16/2024)  Housing: Low Risk  (01/16/2024)  Transportation Needs: No Transportation Needs (01/16/2024)  Utilities: Not At Risk (01/16/2024)  Alcohol Screen: Low Risk  (02/08/2023)  Depression (PHQ2-9): Low Risk  (01/16/2024)  Financial Resource Strain: Low Risk  (11/08/2023)  Physical Activity: Insufficiently Active (11/08/2023)  Social Connections: Moderately Isolated (11/08/2023)  Stress: No Stress Concern Present (11/08/2023)  Tobacco Use: Medium Risk (01/16/2024)  Health Literacy: Adequate Health Literacy (02/08/2023)    Chaplain and patient discussed common feelings and emotions when being diagnosed with cancer, and the importance of support during treatment.  Chaplain informed patient of the support team and support services at [facility].  Chaplain provided contact information and encouraged patient to call with any questions or concerns.  Follow up needed: Yes.  Chaplain will place [facility] Guide referral per request and follow up by phone in ca 2 weeks for opportunity for continued  emotional processing and support.  [address] [place]lam [place], [place], [facility] Pager [phone] Voicemail [phone]

## 2024-01-16 NOTE — Therapy (Signed)
 OUTPATIENT PHYSICAL THERAPY BREAST CANCER BASELINE EVALUATION   Patient Name: Stephanie Neal MRN: [id number] DOB:[birth year], 68 y.o., female Today's Date: 01/16/2024  END OF SESSION:  PT End of Session - 01/16/24 1335     Visit Number 1    Number of Visits 2    Date for PT Re-Evaluation 03/12/24    PT Start Time 1409    PT Stop Time 1428   Also saw pt from [phone] for a total of 41 min   PT Time Calculation (min) 19 min    Activity Tolerance Patient tolerated treatment well    Behavior During Therapy [facility] for tasks assessed/performed          Past Medical History:  Diagnosis Date   Diabetes mellitus    Hyperlipidemia    Hypertension    Past Surgical History:  Procedure Laterality Date   BREAST BIOPSY Left 01/08/2024   US  LT BREAST BX W LOC DEV 1ST LESION IMG BX SPEC US  GUIDE 01/08/2024 [facility]   BREAST BIOPSY Left 01/08/2024   US  LT BREAST BX W LOC DEV EA ADD LESION IMG BX SPEC US  GUIDE 01/08/2024 [facility] MAMMO[facility]   FOOT SURGERY     MICROLARYNGOSCOPY N/A 01/18/2022   Procedure: MICROLARYNGOSCOPY WITH EXCISION OF POLYPS;  Surgeon: Jesus Oliphant, MD;  Location: [facility] OR;  Service: ENT;  Laterality: N/A;   Patient Active Problem List   Diagnosis Date Noted   Malignant neoplasm of upper-inner quadrant of left breast in female, estrogen receptor positive (HCC) 01/14/2024   Aortic atherosclerosis (HCC) 05/05/2021   Tetanus, diphtheria, and acellular pertussis (Tdap) vaccination declined 05/10/2020   Hyperlipidemia associated with type 2 diabetes mellitus (HCC) 11/18/2019   Chronic pain of right knee 11/18/2019   Mixed hyperlipidemia 06/25/2018   Over weight 06/25/2018   Stress due to family tension 11/13/2017   DM type 2 (diabetes mellitus, type 2) (HCC) 03/05/2014   HYPERCHOLESTEROLEMIA 03/12/2007   OBESITY 03/12/2007   Tobacco dependence 03/12/2007   HTN (hypertension) 03/12/2007   ALLERGIC RHINITIS, SEASONAL 03/12/2007    REFERRING PROVIDER: Dr. Donnice Bury  REFERRING DIAG: Left breast cancer  THERAPY DIAG:  Malignant neoplasm of upper-inner quadrant of left breast in female, estrogen receptor positive (HCC)  Abnormal posture  Rationale for Evaluation and Treatment: Rehabilitation  ONSET DATE: 11/16/2023  SUBJECTIVE:                                                                                                                                                                                           SUBJECTIVE STATEMENT: Patient reports she is here today to be seen by her medical team  for her newly diagnosed left breast cancer.   PERTINENT HISTORY:  Patient was diagnosed on 11/16/2023 with left grade 2 invasive lobular carcinoma breast cancer. It measures 1.3 cm and 9 mm and is located in the upper inner quadrant. It is ER/PR positive and HER2 negative with a Ki67 of 10%.   PATIENT GOALS:   reduce lymphedema risk and learn post op HEP.   PAIN:  Are you having pain? No  PRECAUTIONS: Active CA   RED FLAGS: None   HAND DOMINANCE: right  WEIGHT BEARING RESTRICTIONS: No  FALLS:  Has patient fallen in last 6 months? No  LIVING ENVIRONMENT: Patient lives with: her friend Lives in: House/apartment Has following equipment at home: None  OCCUPATION: Retired  LEISURE: She walks some  PRIOR LEVEL OF FUNCTION: Independent   OBJECTIVE: Note: Objective measures were completed at Evaluation unless otherwise noted.  COGNITION: Overall cognitive status: Within functional limits for tasks assessed    POSTURE:  Forward head and rounded shoulders posture  UPPER EXTREMITY AROM/PROM:  A/PROM RIGHT   eval   Shoulder extension 52  Shoulder flexion 151  Shoulder abduction 170  Shoulder internal rotation 70  Shoulder external rotation 82    (Blank rows = not tested)  A/PROM LEFT   eval  Shoulder extension 48  Shoulder flexion 151  Shoulder abduction 172  Shoulder internal rotation 75  Shoulder external  rotation 73    (Blank rows = not tested)  CERVICAL AROM: All within normal limits  UPPER EXTREMITY STRENGTH: WNL  LYMPHEDEMA ASSESSMENTS (in cm):   LANDMARK RIGHT   eval  10 cm proximal to olecranon process 28.3  Olecranon process 25.4  10 cm proximal to ulnar styloid process 20.9  Just proximal to ulnar styloid process 15.9  Across hand at thumb web space 19.8  At base of 2nd digit 6.1  (Blank rows = not tested)  LANDMARK LEFT   eval  10 cm proximal to olecranon process 28.7  Olecranon process 25.2  10 cm proximal to ulnar styloid process 19.9  Just proximal to ulnar styloid process 15.9  Across hand at thumb web space 19.8  At base of 2nd digit 6.2  (Blank rows = not tested)  L-DEX LYMPHEDEMA SCREENING:  The patient was assessed using the L-Dex machine today to produce a lymphedema index baseline score. The patient will be reassessed on a regular basis (typically every 3 months) to obtain new L-Dex scores. If the score is > 6.5 points away from his/her baseline score indicating onset of subclinical lymphedema, it will be recommended to wear a compression garment for 4 weeks, 12 hours per day and then be reassessed. If the score continues to be > 6.5 points from baseline at reassessment, we will initiate lymphedema treatment. Assessing in this manner has a 95% rate of preventing clinically significant lymphedema.   L-DEX FLOWSHEETS - 01/16/24 1500       L-DEX LYMPHEDEMA SCREENING   Measurement Type Unilateral    L-DEX MEASUREMENT EXTREMITY Upper Extremity    POSITION  Standing    DOMINANT SIDE Right    At Risk Side Left    BASELINE SCORE (UNILATERAL) 2.2          QUICK DASH SURVEY:  Junie Palin - 01/16/24 0001     Open a tight or new jar No difficulty    Do heavy household chores (wash walls, wash floors) No difficulty    Carry a shopping bag or briefcase No difficulty    Wash  your back No difficulty    Use a knife to cut food No difficulty    Recreational  activities in which you take some force or impact through your arm, shoulder, or hand (golf, hammering, tennis) No difficulty    During the past week, to what extent has your arm, shoulder or hand problem interfered with your normal social activities with family, friends, neighbors, or groups? Not at all    During the past week, to what extent has your arm, shoulder or hand problem limited your work or other regular daily activities Not at all    Arm, shoulder, or hand pain. None    Tingling (pins and needles) in your arm, shoulder, or hand None    Difficulty Sleeping No difficulty    DASH Score 0 %           PATIENT EDUCATION:  Education details: Time spent educating patient on aspects of self-care to maximize post op recovery. Patient was educated on where and how to get a post op compression bra to use to reduce post op edema. Patient was also educated on the use of SOZO screenings and surveillance principles for early identification of lymphedema onset. She was instructed to use the post op pillow in the axilla for pressure and pain relief. Patient educated on lymphedema risk reduction and post op shoulder/posture HEP. Person educated: Patient Education method: Explanation, Demonstration, Handout Education comprehension: Patient verbalized understanding and returned demonstration  HOME EXERCISE PROGRAM: Patient was instructed today in a home exercise program today for post op shoulder range of motion. These included active assist shoulder flexion in sitting, scapular retraction, wall walking with shoulder abduction, and hands behind head external rotation.  She was encouraged to do these twice a day, holding 3 seconds and repeating 5 times when permitted by her physician.   ASSESSMENT:  CLINICAL IMPRESSION: Patient was diagnosed on 11/16/2023 with left grade 2 invasive lobular carcinoma breast cancer. It measures 1.3 cm and 9 mm and is located in the upper inner quadrant. It is ER/PR  positive and HER2 negative with a Ki67 of 10%. Her multidisciplinary medical team met prior to her assessments to determine a recommended treatment plan. She is planning to have a left lumpectomy and sentinel node biopsy followed by Oncotype testing, radiation, and anti-estrogen therapy. She will benefit from a post op PT reassessment to determine needs and from L-Dex screens every 3 months for 2 years to detect subclinical lymphedema.  Pt will benefit from skilled therapeutic intervention to improve on the following deficits: Decreased knowledge of precautions, impaired UE functional use, pain, decreased ROM, postural dysfunction.   PT treatment/interventions: ADL/self-care home management, pt/family education, therapeutic exercise  REHAB POTENTIAL: Excellent  CLINICAL DECISION MAKING: Stable/uncomplicated  EVALUATION COMPLEXITY: Low   GOALS: Goals reviewed with patient? YES  LONG TERM GOALS: (STG=LTG)    Name Target Date Goal status  1 Pt will be able to verbalize understanding of pertinent lymphedema risk reduction practices relevant to her dx specifically related to skin care.  Baseline:  No knowledge 01/16/2024 Achieved at eval  2 Pt will be able to return demo and/or verbalize understanding of the post op HEP related to regaining shoulder ROM. Baseline:  No knowledge 01/16/2024 Achieved at eval  3 Pt will be able to verbalize understanding of the importance of viewing the post op After Breast CA Class video for further lymphedema risk reduction education and therapeutic exercise.  Baseline:  No knowledge 01/16/2024 Achieved at eval  4 Pt  will demo she has regained full shoulder ROM and function post operatively compared to baselines.  Baseline: See objective measurements taken today. 03/12/2024     PLAN:  PT FREQUENCY/DURATION: EVAL and 1 follow up appointment.   PLAN FOR NEXT SESSION: will reassess 3-4 weeks post op to determine needs.   Patient will follow up at outpatient  cancer rehab 3-4 weeks following surgery.  If the patient requires physical therapy at that time, a specific plan will be dictated and sent to the referring physician for approval. The patient was educated today on appropriate basic range of motion exercises to begin post operatively and the importance of viewing the After Breast Cancer class video following surgery.  Patient was educated today on lymphedema risk reduction practices as it pertains to recommendations that will benefit the patient immediately following surgery.  She verbalized good understanding.    Physical Therapy Information for After Breast Cancer Surgery/Treatment:  Lymphedema is a swelling condition that you may be at risk for in your arm if you have lymph nodes removed from the armpit area.  After a sentinel node biopsy, the risk is approximately 5-9% and is higher after an axillary node dissection.  There is treatment available for this condition and it is not life-threatening.  Contact your physician or physical therapist with concerns. You may begin the 4 shoulder/posture exercises (see additional sheet) when permitted by your physician (typically a week after surgery).  If you have drains, you may need to wait until those are removed before beginning range of motion exercises.  A general recommendation is to not lift your arms above shoulder height until drains are removed.  These exercises should be done to your tolerance and gently.  This is not a no pain/no gain type of recovery so listen to your body and stretch into the range of motion that you can tolerate, stopping if you have pain.  If you are having immediate reconstruction, ask your plastic surgeon about doing exercises as he or she may want you to wait. We encourage you to view the After Breast Cancer class video following surgery.  You will learn information related to lymphedema risk, prevention and treatment and additional exercises to regain mobility following  surgery.   While undergoing any medical procedure or treatment, try to avoid blood pressure being taken or needle sticks from occurring on the arm on the side of cancer.   This recommendation begins after surgery and continues for the rest of your life.  This may help reduce your risk of getting lymphedema (swelling in your arm). An excellent resource for those seeking information on lymphedema is the National Lymphedema Network's web site. It can be accessed at www.lymphnet.org If you notice swelling in your hand, arm or breast at any time following surgery (even if it is many years from now), please contact your doctor or physical therapist to discuss this.  Lymphedema can be treated at any time but it is easier for you if it is treated early on.  If you feel like your shoulder motion is not returning to normal in a reasonable amount of time, please contact your surgeon or physical therapist.  [facility] [phone]. [address], [address], [place] [address]  [facility] CLASS After Breast Cancer Class  After Breast Cancer Class is a specially designed exercise class video to assist you in a safe recover after having breast cancer surgery.  In this video you will learn how to get back  to full function whether your drains were just removed or if you had surgery a month ago. The video can be viewed on this page: [email or web address] or on YouTube here: https://[email or web address].  Class Goals  Understand specific stretches to improve the flexibility of you chest and shoulder. Learn ways to safely strengthen your upper body and improve your posture. Understand the warning signs of infection and why you may be at risk for an arm infection. Learn about Lymphedema and prevention.  ** You do not need to view this video until after surgery.  Drains should be removed to participate in the recommended  exercises on the video.  Patient was instructed today in a home exercise program today for post op shoulder range of motion. These included active assist shoulder flexion in sitting, scapular retraction, wall walking with shoulder abduction, and hands behind head external rotation.  She was encouraged to do these twice a day, holding 3 seconds and repeating 5 times when permitted by her physician.  Eward Wonda Sharps, [place] 01/16/24 3:44 PM

## 2024-01-16 NOTE — Assessment & Plan Note (Signed)
 01/08/2024:Screening mammogram detected left breast distortion LIQ, ultrasound detected mass at 9:30 position: 0.9 cm, second mass at the 11 o'clock position 1.3 cm, axilla negative, biopsy: Grade 2 ILC, no LVI, ER 100%, PR 25%, Ki67 10%, HER2 1+ negative  Pathology and radiology counseling:Discussed with the patient, the details of pathology including the type of breast cancer,the clinical staging, the significance of ER, PR and HER-2/neu receptors and the implications for treatment. After reviewing the pathology in detail, we proceeded to discuss the different treatment options between surgery, radiation, chemotherapy, antiestrogen therapies.  Breast MRI will be ordered  Recommendations: 1. Breast conserving surgery followed by 2. Oncotype DX testing to determine if chemotherapy would be of any benefit followed by 3. Adjuvant radiation therapy followed by 4. Adjuvant antiestrogen therapy  Oncotype counseling: I discussed Oncotype DX test. I explained to the patient that this is a 21 gene panel to evaluate patient tumors DNA to calculate recurrence score. This would help determine whether patient has high risk or low risk breast cancer. She understands that if her tumor was found to be high risk, she would benefit from systemic chemotherapy. If low risk, no need of chemotherapy.  Return to clinic after surgery to discuss final pathology report and then determine if Oncotype DX testing will need to be sent.

## 2024-01-17 ENCOUNTER — Encounter: Payer: Self-pay | Admitting: *Deleted

## 2024-01-22 ENCOUNTER — Encounter: Payer: Self-pay | Admitting: *Deleted

## 2024-01-22 ENCOUNTER — Telehealth: Payer: Self-pay | Admitting: *Deleted

## 2024-01-22 NOTE — Telephone Encounter (Signed)
 Called and spoke to patient regarding [facility]C last week. She had a few questions about her MRI in am that were answered. Aware not to eat in am prior to test. Discussed calling [employer] next week to make an appt for her post op bra once surgery date is set. Aware to call back for any needs/concerns.

## 2024-01-23 ENCOUNTER — Ambulatory Visit
Admission: RE | Admit: 2024-01-23 | Discharge: 2024-01-23 | Disposition: A | Discharge status: Home / Self Care | Source: Ambulatory Visit | Attending: Hematology and Oncology

## 2024-01-23 DIAGNOSIS — C50212 Malignant neoplasm of upper-inner quadrant of left female breast: Secondary | ICD-10-CM

## 2024-01-23 DIAGNOSIS — C50912 Malignant neoplasm of unspecified site of left female breast: Secondary | ICD-10-CM | POA: Diagnosis not present

## 2024-01-23 MED ORDER — GADOPICLENOL 0.5 MMOL/ML IV SOLN
8.0000 mL | Freq: Once | INTRAVENOUS | Status: AC | PRN
Start: 2024-01-23 — End: 2024-01-23
  Administered 2024-01-23: 8 mL via INTRAVENOUS

## 2024-01-24 ENCOUNTER — Encounter: Payer: Self-pay | Admitting: *Deleted

## 2024-01-25 ENCOUNTER — Other Ambulatory Visit: Payer: Self-pay | Admitting: General Surgery

## 2024-01-25 DIAGNOSIS — Z170 Estrogen receptor positive status [ER+]: Secondary | ICD-10-CM

## 2024-01-29 ENCOUNTER — Other Ambulatory Visit: Payer: Self-pay | Admitting: General Surgery

## 2024-01-29 DIAGNOSIS — Z170 Estrogen receptor positive status [ER+]: Secondary | ICD-10-CM

## 2024-01-29 DIAGNOSIS — Z1379 Encounter for other screening for genetic and chromosomal anomalies: Secondary | ICD-10-CM | POA: Insufficient documentation

## 2024-01-30 ENCOUNTER — Encounter (HOSPITAL_COMMUNITY): Payer: Self-pay

## 2024-01-30 NOTE — Progress Notes (Signed)
 Surgical Instructions    Your procedure is scheduled on Tuesday, 107/25.  Report to [facility] Main Entrance A at 5:30 A.M., then check in with the Admitting office.  Call this number if you have problems the morning of surgery:  [phone]   If you have any questions prior to your surgery date call [phone]: Open Monday-Friday 8am-4pm If you experience any cold or flu symptoms such as cough, fever, chills, shortness of breath, etc. between now and your scheduled surgery, please notify us  at the above number     Remember:  Do not eat after midnight the night before your surgery  You may drink clear liquids until 4:30 AM,  the morning of your surgery.   Clear liquids allowed are: Water, Non-Citrus Juices (without pulp), Carbonated Beverages, Clear Tea, Black Coffee ONLY (NO MILK, CREAM OR POWDERED CREAMER of any kind), and Gatorade    Take these medicines the morning of surgery with A SIP OF WATER:  amLODipine  (NORVASC )  atorvastatin  (LIPITOR)   IF NEEDED: fluticasone  (FLONASE ) Nasal Spray   ORAL DIABETIC MEDICATION INSTRUCTIONS (Glimepiride )   The night before your procedure-Monday:  Do not take evening dose of Glimepiride   The day of your procedure-Tuesday:  Do not take Metformin  and Glimepiride  on day of surgery.  We will check your blood sugar upon your arrival to Short Stay and will treat if needed.    As of today, STOP taking any Aspirin  (unless otherwise instructed by your surgeon) Aleve, Naproxen, Ibuprofen , Motrin , Advil , Goody's, BC's, all herbal medications, fish oil, and all vitamins.           Do not wear jewelry or makeup. Do not wear lotions, powders, perfumes or deodorant. Do not shave 48 hours prior to surgery.   Do not bring valuables to the hospital. Do not wear nail polish, gel polish, artificial nails, or any other type of covering on natural nails (fingers and toes) If you have artificial nails or gel coating that need to be removed by a nail  salon, please have this removed prior to surgery. Artificial nails or gel coating may interfere with anesthesia's ability to adequately monitor your vital signs.  [facility] is not responsible for any belongings or valuables.    Do NOT Smoke (Tobacco/Vaping)  24 hours prior to your procedure  If you use a CPAP at night, you may bring your mask for your overnight stay.   Contacts, glasses, hearing aids, dentures or partials may not be worn into surgery, please bring cases for these belongings     Patients discharged the day of surgery will not be allowed to drive home, and someone needs to stay with them for 24 hours.   SURGICAL WAITING ROOM VISITATION Patients having surgery or a procedure may have no more than 2 support people in the waiting area - these visitors may rotate.   Children under the age of 2 must have an adult with them who is not the patient. If the patient needs to stay at the hospital during part of their recovery, the visitor guidelines for inpatient rooms apply. Pre-op nurse will coordinate an appropriate time for 1 support person to accompany patient in pre-op.  This support person may not rotate.   Please refer to [email or web address] for the visitor guidelines for Inpatients (after your surgery is over and you are in a regular room).    Special instructions:    Oral Hygiene is also important to reduce your risk of infection.  Remember - BRUSH  YOUR TEETH THE MORNING OF SURGERY WITH YOUR REGULAR TOOTHPASTE   [facility]- Preparing For Surgery  Before surgery, you can play an important role. Because skin is not sterile, your skin needs to be as free of germs as possible. You can reduce the number of germs on your skin by washing with CHG (chlorahexidine gluconate) Soap before surgery.  CHG is an antiseptic cleaner which kills germs and bonds with the skin to continue killing germs even after washing.     Please do  not use if you have an allergy to CHG or antibacterial soaps. If your skin becomes reddened/irritated stop using the CHG.  Do not shave (including legs and underarms) for at least 48 hours prior to first CHG shower. It is OK to shave your face.  Please follow these instructions carefully.     Shower the [employer] [facility]-Mon and the MORNING OF SURGERY-Tues with CHG Soap.   If you chose to wash your hair, wash your hair first as usual with your normal shampoo. After you shampoo, rinse your hair and body thoroughly to remove the shampoo.  Then [employer] and genitals (private parts) with your normal soap and rinse thoroughly to remove soap.  After that Use CHG Soap as you would any other liquid soap. You can apply CHG directly to the skin and wash gently with a scrungie or a clean washcloth.   Apply the CHG Soap to your body ONLY FROM THE NECK DOWN.  Do not use on open wounds or open sores. Avoid contact with your eyes, ears, mouth and genitals (private parts). Wash Face and genitals (private parts)  with your normal soap.   Wash thoroughly, paying special attention to the area where your surgery will be performed.  Thoroughly rinse your body with warm water from the neck down.  DO NOT shower/wash with your normal soap after using and rinsing off the CHG Soap.  Pat yourself dry with a CLEAN TOWEL.  Wear CLEAN PAJAMAS to bed the night before surgery  Place CLEAN SHEETS on your bed the night before your surgery  DO NOT SLEEP WITH PETS.   Day of Surgery:  Take a shower with CHG soap. Wear Clean/Comfortable clothing the morning of surgery Do not apply any deodorants/lotions.   Remember to brush your teeth WITH YOUR REGULAR TOOTHPASTE.    If you received a COVID test during your pre-op visit, it is requested that you wear a mask when out in public, stay away from anyone that may not be feeling well, and notify your surgeon if you develop symptoms. If you have been in contact with  anyone that has tested positive in the last 10 days, please notify your surgeon.    Please read over the following fact sheets that you were given.

## 2024-01-31 ENCOUNTER — Encounter (HOSPITAL_COMMUNITY): Payer: Self-pay

## 2024-01-31 ENCOUNTER — Encounter: Payer: Self-pay | Admitting: *Deleted

## 2024-01-31 ENCOUNTER — Other Ambulatory Visit: Payer: Self-pay

## 2024-01-31 ENCOUNTER — Encounter (HOSPITAL_COMMUNITY)
Admission: RE | Admit: 2024-01-31 | Discharge: 2024-01-31 | Disposition: A | Discharge status: Home / Self Care | Source: Ambulatory Visit | Attending: General Surgery | Admitting: General Surgery

## 2024-01-31 VITALS — BP 153/69 | HR 96 | Temp 97.9°F | Resp 18 | Ht 71.0 in | Wt 173.9 lb

## 2024-01-31 DIAGNOSIS — R490 Dysphonia: Secondary | ICD-10-CM | POA: Insufficient documentation

## 2024-01-31 DIAGNOSIS — Z7984 Long term (current) use of oral hypoglycemic drugs: Secondary | ICD-10-CM | POA: Diagnosis not present

## 2024-01-31 DIAGNOSIS — Z01818 Encounter for other preprocedural examination: Secondary | ICD-10-CM | POA: Diagnosis not present

## 2024-01-31 DIAGNOSIS — Z87891 Personal history of nicotine dependence: Secondary | ICD-10-CM | POA: Diagnosis not present

## 2024-01-31 DIAGNOSIS — E785 Hyperlipidemia, unspecified: Secondary | ICD-10-CM | POA: Diagnosis not present

## 2024-01-31 DIAGNOSIS — I10 Essential (primary) hypertension: Secondary | ICD-10-CM | POA: Insufficient documentation

## 2024-01-31 DIAGNOSIS — C50912 Malignant neoplasm of unspecified site of left female breast: Secondary | ICD-10-CM | POA: Insufficient documentation

## 2024-01-31 DIAGNOSIS — E119 Type 2 diabetes mellitus without complications: Secondary | ICD-10-CM | POA: Diagnosis not present

## 2024-01-31 HISTORY — DX: Dysphonia: R49.0

## 2024-01-31 LAB — GLUCOSE, CAPILLARY: Glucose-Capillary: 177 mg/dL — ABNORMAL HIGH (ref 70–99)

## 2024-01-31 LAB — HEMOGLOBIN A1C
Hgb A1c MFr Bld: 7.3 % — ABNORMAL HIGH (ref 4.8–5.6)
Mean Plasma Glucose: 162.81 mg/dL

## 2024-01-31 NOTE — Progress Notes (Addendum)
 Surgical Instructions    Your procedure is scheduled on Tuesday, February 05, 2024.  Report to [facility] Main Entrance A at 5:30 A.M., then check in with the Admitting office.  Call this number if you have problems the morning of surgery:  [phone]   If you have any questions prior to your surgery date call [phone]: Open Monday-Friday 8am-4pm If you experience any cold or flu symptoms such as cough, fever, chills, shortness of breath, etc. between now and your scheduled surgery, please notify us  at the above number     Remember:  Do not eat after midnight the night before your surgery  You may drink clear liquids until 4:30 AM,  the morning of your surgery.   Clear liquids allowed are: Water, Non-Citrus Juices (without pulp), Carbonated Beverages, Clear Tea, Black Coffee ONLY (NO MILK, CREAM OR POWDERED CREAMER of any kind), and Gatorade  Patient Instructions  The night before surgery:  No food after midnight. ONLY clear liquids after midnight   The day of surgery (if you have diabetes): Drink ONE (1) 12 oz G2 given to you in your pre admission testing appointment by 4:30am the morning of surgery. Drink in one sitting. Do not sip.  This drink was given to you during your hospital  pre-op appointment visit.  Nothing else to drink after completing the  12 oz bottle of G2.         If you have questions, please contact your surgeon's office.     Take these medicines the morning of surgery with A SIP OF WATER:  amLODipine  (NORVASC )  atorvastatin  (LIPITOR)   IF NEEDED: fluticasone  (FLONASE ) Nasal Spray   As of today, STOP taking any Aspirin  (unless otherwise instructed by your surgeon) Aleve, Naproxen, Ibuprofen , Motrin , Advil , Goody's, BC's, all herbal medications, fish oil, and all vitamins.   ORAL DIABETIC MEDICATION INSTRUCTIONS (Glimepiride )   The night before your procedure-Monday:  Do not take evening dose of Glimepiride   The day of your  procedure-Tuesday:  Do not take Metformin  and Glimepiride  on day of surgery.  We will check your blood sugar upon your arrival to Short Stay and will treat if needed.   HOW TO MANAGE YOUR DIABETES BEFORE AND AFTER SURGERY  Why is it important to control my blood sugar before and after surgery? Improving blood sugar levels before and after surgery helps healing and can limit problems. A way of improving blood sugar control is eating a healthy diet by:  Eating less sugar and carbohydrates  Increasing activity/exercise  Talking with your doctor about reaching your blood sugar goals High blood sugars (greater than 180 mg/dL) can raise your risk of infections and slow your recovery, so you will need to focus on controlling your diabetes during the weeks before surgery. Make sure that the doctor who takes care of your diabetes knows about your planned surgery including the date and location.  How do I manage my blood sugar before surgery? Check your blood sugar at least 4 times a day, starting 2 days before surgery, to make sure that the level is not too high or low.  Check your blood sugar the morning of your surgery when you wake up and every 2 hours until you get to the Short Stay unit.  If your blood sugar is less than 70 mg/dL, you will need to treat for low blood sugar: Do not take insulin . Treat a low blood sugar (less than 70 mg/dL) with  cup of clear juice (cranberry or  apple), 4 glucose tablets, OR glucose gel. Recheck blood sugar in 15 minutes after treatment (to make sure it is greater than 70 mg/dL). If your blood sugar is not greater than 70 mg/dL on recheck, call [phone] for further instructions. Report your blood sugar to the short stay nurse when you get to Short Stay.  If you are admitted to the hospital after surgery: Your blood sugar will be checked by the staff and you will probably be given insulin  after surgery (instead of oral diabetes medicines) to make sure you  have good blood sugar levels. The goal for blood sugar control after surgery is 80-180 mg/dL.            Do not wear jewelry or makeup. Do not wear lotions, powders, perfumes or deodorant. Do not shave 48 hours prior to surgery.   Do not bring valuables to the hospital. Do not wear nail polish, gel polish, artificial nails, or any other type of covering on natural nails (fingers and toes) If you have artificial nails or gel coating that need to be removed by a nail salon, please have this removed prior to surgery. Artificial nails or gel coating may interfere with anesthesia's ability to adequately monitor your vital signs.  [facility] is not responsible for any belongings or valuables.    Do NOT Smoke (Tobacco/Vaping)  24 hours prior to your procedure  If you use a CPAP at night, you may bring your mask for your overnight stay.   Contacts, glasses, hearing aids, dentures or partials may not be worn into surgery, please bring cases for these belongings     Patients discharged the day of surgery will not be allowed to drive home, and someone needs to stay with them for 24 hours.   SURGICAL WAITING ROOM VISITATION Patients having surgery or a procedure may have no more than 2 support people in the waiting area - these visitors may rotate.   Children under the age of 33 must have an adult with them who is not the patient. If the patient needs to stay at the hospital during part of their recovery, the visitor guidelines for inpatient rooms apply. Pre-op nurse will coordinate an appropriate time for 1 support person to accompany patient in pre-op.  This support person may not rotate.   Please refer to [email or web address] for the visitor guidelines for Inpatients (after your surgery is over and you are in a regular room).    Special instructions:    Oral Hygiene is also important to reduce your risk of infection.  Remember - BRUSH YOUR  TEETH THE MORNING OF SURGERY WITH YOUR REGULAR TOOTHPASTE   [facility]- Preparing For Surgery  Before surgery, you can play an important role. Because skin is not sterile, your skin needs to be as free of germs as possible. You can reduce the number of germs on your skin by washing with CHG (chlorahexidine gluconate) Soap before surgery.  CHG is an antiseptic cleaner which kills germs and bonds with the skin to continue killing germs even after washing.     Please do not use if you have an allergy to CHG or antibacterial soaps. If your skin becomes reddened/irritated stop using the CHG.  Do not shave (including legs and underarms) for at least 48 hours prior to first CHG shower. It is OK to shave your face.  Please follow these instructions carefully.     Shower the [employer] [facility]-Mon and the MORNING OF SURGERY-Tues with CHG Soap.  If you chose to wash your hair, wash your hair first as usual with your normal shampoo. After you shampoo, rinse your hair and body thoroughly to remove the shampoo.  Then [employer] and genitals (private parts) with your normal soap and rinse thoroughly to remove soap.  After that Use CHG Soap as you would any other liquid soap. You can apply CHG directly to the skin and wash gently with a scrungie or a clean washcloth.   Apply the CHG Soap to your body ONLY FROM THE NECK DOWN.  Do not use on open wounds or open sores. Avoid contact with your eyes, ears, mouth and genitals (private parts). Wash Face and genitals (private parts)  with your normal soap.   Wash thoroughly, paying special attention to the area where your surgery will be performed.  Thoroughly rinse your body with warm water from the neck down.  DO NOT shower/wash with your normal soap after using and rinsing off the CHG Soap.  Pat yourself dry with a CLEAN TOWEL.  Wear CLEAN PAJAMAS to bed the night before surgery  Place CLEAN SHEETS on your bed the night before your surgery  DO NOT  SLEEP WITH PETS.   Day of Surgery:  Take a shower with CHG soap. Wear Clean/Comfortable clothing the morning of surgery Do not apply any deodorants/lotions.   Remember to brush your teeth WITH YOUR REGULAR TOOTHPASTE.    If you received a COVID test during your pre-op visit, it is requested that you wear a mask when out in public, stay away from anyone that may not be feeling well, and notify your surgeon if you develop symptoms. If you have been in contact with anyone that has tested positive in the last 10 days, please notify your surgeon.    Please read over the following fact sheets that you were given.

## 2024-01-31 NOTE — Anesthesia Preprocedure Evaluation (Addendum)
 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 on medications Normal cardiovascular exam Rhythm:Regular Rate:Normal     Neuro/Psych negative neurological ROS  negative psych ROS   GI/Hepatic negative GI ROS, Neg liver ROS,,,  Endo/Other  diabetes, Well Controlled, Type 2, Oral Hypoglycemic Agents    Renal/GU negative Renal ROS  negative genitourinary   Musculoskeletal  (+) Arthritis , Osteoarthritis,    Abdominal   Peds negative pediatric ROS (+)  Hematology negative hematology ROS (+)   Anesthesia Other Findings L breast ca   Reproductive/Obstetrics negative OB ROS                              Anesthesia Physical Anesthesia Plan  ASA: 3  Anesthesia Plan: General and Regional   Post-op Pain Management: Tylenol  PO (pre-op)* and Regional block*   Induction: Intravenous  PONV Risk Score and Plan: 3 and Ondansetron , Dexamethasone , Midazolam  and Treatment may vary due to age or medical condition  Airway Management Planned: LMA  Additional Equipment: None  Intra-op Plan:   Post-operative Plan: Extubation in OR  Informed Consent: I have reviewed the patients History and Physical, chart, labs and discussed the procedure including the risks, benefits and alternatives for the proposed anesthesia with the patient or authorized representative who has indicated  his/her understanding and acceptance.     Dental advisory given  Plan Discussed with: CRNA  Anesthesia Plan Comments: ( )         Anesthesia Quick Evaluation

## 2024-01-31 NOTE — Progress Notes (Signed)
 PCP - Dr. Barnie Louder Cardiologist - denies  PPM/ICD - n/a Device Orders -  Rep Notified -   Chest x-ray - 03/15/23 EKG - 01/31/24 Stress Test - denies ECHO - denies Cardiac Cath - denies  Sleep Study -  CPAP -   Fasting Blood Sugar - 130-170's Checks Blood Sugar daily--occasionally forgets to check  Last dose of GLP1 agonist-  n/a GLP1 instructions:   Blood Thinner Instructions: n/a Aspirin  Instructions:  ERAS Protcol - clears until 4:30 PRE-SURGERY Ensure or G2- G2 provided  COVID TEST- n/a   Anesthesia review: Seed Placement  Patient denies shortness of breath, fever, cough and chest pain at PAT appointment. Pt with hoarse voice, says her breathing and voice at baseline. Could audibly hear breathing during appointment--asked anesthesia APP to assess

## 2024-01-31 NOTE — Progress Notes (Signed)
 Anesthesia Chart Review:  Case: [id number] Date/Time: 02/05/24 0715   Procedure: BREAST LUMPECTOMY WITH RADIOACTIVE SEED AND SENTINEL LYMPH NODE BIOPSY (Left: Breast) - GEN w/PEC BLOCK LEFT BREAST RADIOACTIVE SEED LOCALIZED LUMPECTOMY x2 LEFT AXILLARY SENTINEL NODE BIOPSY   Anesthesia type: General   Pre-op diagnosis: LEFT BREAST CANCER   [facility] RO[facility] 01 / [facility]   Surgeons: Ebbie Cough, MD       DISCUSSION: Patient is a 68 year old female scheduled for the above procedure.   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   I evaluated patient at her PAT visit due to hoarse, breathy voice with prior microlaryngoscopy and polyp excision on 9/20/203. Case at that time was posted somewhat urgently after she presented with several month history increasing hoarseness and difficulty speaking with feeling of mucous getting caught in there throat. Laryngoscopy then showed Reinke's edema with an asymmetric right-sided ball valving mass off the vocal folds. Surgical pathology was benign and most consistent with a vocal cord polyp/singers nodule with ulceration and acute inflammation. At her 02/09/2022 follow-up she reported good results and felt her voice was back to normal. She reported known deep voice since childhood.   Last ENT evaluation was on 04/20/2023 by Gust Radford, MD ([facility]) for cough. Dysphonia recurred after sinus infection. She had quit smoking the year prior. No sore throat or dysphagia. She often felt mucous in her throat. Denied GERD, PND. He described her voice quality as gravelly, strained. No stridor. He performed a videostroboscopy. Results showed clear nasopharynx, no masses or abnormalities of the hypopharynx, no signs of reflux or postcricoid edema, larynx with vocal folds mobile for AB-duction and AD-duction., no  vibration of the vocal cords noted. 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 - He recommended increase hydration, avoidance of mint/menthol, minimize throat clearing. He explained she has benign smoking related polypoid changes to her vocal folds and to return if worsening voice or difficulty swallowing or breathing. He referred her to speech therapy, which she said she completed.   She reported her voice as heavy even as a child.  She said a deep raspy voice is her baseline.  She does report chronic sinus drainage.  She denies sick symptoms such as sore throat, cough, fever.  She she does tend to have exacerbations when there is heavy pollen or windy conditions.  She will take Mucinex  or other Delsym as needed.  She insist her breathing is at baseline and denies shortness of breath, chest pain, orthopnea, edema, syncope.  She normally sleeps on her right side but says she can lie flat without difficulty breathing. She walks around a trail near her home and at [facility]. She says she can walk for about a mile and can clean her own home including sweeping vacuums.  As above, she reports her dysphonia is chronic. She denied sick symptoms and does not report any limitation in her activity related to her breathing. Discussed with anesthesiologist Keneth Duncans, MD. For 01/18/2022 microlaryngoscopy, there was no difficulty with intubation, although a Glidescope and 3 were used, Grade 1 view. A 6 mm tube was used then. Anesthesia team to evaluate on the day of surgery.   RSL 02/01/2024 at 11:00 AM.   VS: BP (!) 153/69   Pulse 96   Temp 36.6 C   Resp 18   Ht 5' 11 (1.803 m)  Wt 78.9 kg   SpO2 95%   BMI 24.25 kg/m  She appears well and without conversational dyspnea. Her voice is hoarse/raspy but easily audible. Upper airway breath sounds more pronounced (transmitted breath sounds?), but otherwise clear with no wheezes, crackles. Heart RRR, no murmur noted. No carotid  bruits noted. No ankle edema. Mallampati III.    PROVIDERS: [name], MD is PCP  [name], MD is HEM-ONC Izell Domino, MD is RAD-ONC  LABS: She had labs on 01/16/2024 at [facility]. Results included: Lab Results  Component Value Date   WBC 6.2 01/16/2024   HGB 12.7 01/16/2024   HCT 36.6 01/16/2024   PLT 221 01/16/2024   GLUCOSE 159 (H) 01/16/2024   ALT 9 01/16/2024   AST 10 (L) 01/16/2024   NA 141 01/16/2024   K 3.5 01/16/2024   CL 105 01/16/2024   CREATININE 0.88 01/16/2024   BUN 9 01/16/2024   CO2 31 01/16/2024   HGBA1C 7.8 (A) 10/29/2023   CBG at PAT was 177.      IMAGES: CXR 03/15/2023 ([facility]): Findings/Impression:  1. Clear lungs.  2.  No pleural effusion or pneumothorax.  3.  Normal cardiac and mediastinal contours. Calcified plaque in the aortic  arch.   EKG: 02/10/2024: Normal sinus rhythm   CV:  Past Medical History:  Diagnosis Date   Allergy    Arthritis    Breast cancer (HCC) 01/08/2024   Diabetes mellitus    Hyperlipidemia    Hypertension     Past Surgical History:  Procedure Laterality Date   BREAST BIOPSY Left 01/08/2024   US  LT BREAST BX W LOC DEV 1ST LESION IMG BX SPEC US  GUIDE 01/08/2024 [facility]   BREAST BIOPSY Left 01/08/2024   US  LT BREAST BX W LOC DEV EA ADD LESION IMG BX SPEC US  GUIDE 01/08/2024 [facility]   COLONOSCOPY     x 2 - 2015, 2020   FOOT SURGERY     MICROLARYNGOSCOPY N/A 01/18/2022   Procedure: MICROLARYNGOSCOPY WITH EXCISION OF POLYPS;  Surgeon: Jesus Oliphant, MD;  Location: [facility] OR;  Service: ENT;  Laterality: N/A;   THROAT SURGERY      MEDICATIONS:  Accu-Chek Softclix Lancets lancets   albuterol  (VENTOLIN  HFA) 108 (90 Base) MCG/ACT inhaler   amLODipine  (NORVASC ) 10 MG tablet   atorvastatin  (LIPITOR) 80 MG tablet   Blood Glucose Monitoring Suppl (ACCU-CHEK GUIDE ME) w/Device KIT   Blood Pressure Monitoring (BLOOD PRESSURE CUFF) MISC   empagliflozin  (JARDIANCE ) 10 MG TABS tablet    fluticasone  (FLONASE ) 50 MCG/ACT nasal spray   glimepiride  (AMARYL ) 4 MG tablet   glucose blood (ACCU-CHEK GUIDE TEST) test strip   ibuprofen  (ADVIL ) 200 MG tablet   lisinopril  (ZESTRIL ) 30 MG tablet   metFORMIN  (GLUCOPHAGE ) 1000 MG tablet   No current facility-administered medications for this encounter.    Isaiah Ruder, PA-C Surgical Short Stay/Anesthesiology [facility] Phone [phone] [facility] Phone [phone] 01/31/2024 7:24 PM

## 2024-02-01 ENCOUNTER — Ambulatory Visit
Admission: RE | Admit: 2024-02-01 | Discharge: 2024-02-01 | Disposition: A | Discharge status: Home / Self Care | Source: Ambulatory Visit | Attending: General Surgery | Admitting: General Surgery

## 2024-02-01 DIAGNOSIS — Z170 Estrogen receptor positive status [ER+]: Secondary | ICD-10-CM

## 2024-02-01 DIAGNOSIS — C50912 Malignant neoplasm of unspecified site of left female breast: Secondary | ICD-10-CM | POA: Diagnosis not present

## 2024-02-01 HISTORY — PX: BREAST BIOPSY: SHX20

## 2024-02-04 NOTE — H&P (Signed)
 83 yof who had screening mm that shows a left breast distortion. 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 She has c density breast tissue. She has a 9x8x7 mm at 930, 1.3x1.3x0.9 cm mass at 11 oclock. Axillary us  is negative. Clips are 2.3 cm apart. Biopsy is a grade II ILC that is 100% er pos, 25% pr pos, her 2 negative and Ki is 10%. She has no nipple dc and notes no mass. She is retired from [employer], lives in [place]  Review of Systems: A complete review of systems was obtained from the patient. I have reviewed this information and discussed as appropriate with the patient. See HPI as well for other ROS.  Review of Systems  All other systems reviewed and are negative.  Medical History: Past Medical History:  Diagnosis Date  Diabetes mellitus without complication (CMS/HHS-HCC)  History of cancer  Hyperlipidemia  Hypertension   Patient Active Problem List  Diagnosis  Benign hypertension  Malignant neoplasm of upper-inner quadrant of left breast in female, estrogen receptor positive (CMS/HHS-HCC)  Hyperlipidemia associated with type 2 diabetes mellitus (CMS/HHS-HCC)  Type 2 diabetes mellitus without complications (CMS/HHS-HCC)   Past Surgical History:  Procedure Laterality Date  .Left Breast Biopsy Left 01/08/2024  UNLISTED PROCEDURE LARYNX   Allergies  Allergen Reactions  Penicillin Other (See Comments)  Patient reports she blacked out   Current Outpatient Medications on File Prior to Visit  Medication Sig Dispense Refill  albuterol  MDI, PROVENTIL , VENTOLIN , PROAIR , HFA 90 mcg/actuation inhaler Inhale 2 inhalations into the lungs every 4 (four) hours as needed for Wheezing or Shortness of Breath  amLODIPine  (NORVASC ) 10 MG tablet Take 10 mg by mouth once daily  glimepiride  (AMARYL ) 4 MG tablet Take 4 mg by mouth 2 (two) times daily  atorvastatin  (LIPITOR) 40 MG tablet Take 40 mg by mouth once daily  lisinopriL  (ZESTRIL ) 30 MG tablet Take 30 mg by mouth  metFORMIN  (GLUCOPHAGE ) 1000 MG tablet TAKE 1  TABLET BY MOUTH 2 (TWO) TIMES DAILY WITH A MEAL. FOR DIABETES   Family History  Problem Relation Age of Onset  High blood pressure (Hypertension) Mother  Diabetes Mother  Diabetes Father    Social History   Tobacco Use  Smoking Status Every Day  Types: Cigarettes  Smokeless Tobacco Never  Marital status: Single  Tobacco Use  Smoking status: Every Day  Types: Cigarettes  Smokeless tobacco: Never  Substance and Sexual Activity  Alcohol use: Not Currently  Drug use: Never   Objective:   Physical Exam Vitals reviewed.  Constitutional:  Appearance: Normal appearance.  Chest:  Breasts: Right: No inverted nipple, mass or nipple discharge.  Left: No inverted nipple, mass or nipple discharge.  Lymphadenopathy:  Upper Body:  Right upper body: No supraclavicular or axillary adenopathy.  Left upper body: No supraclavicular or axillary adenopathy.  Neurological:  Mental Status: She is alert.     Assessment and Plan:   Left breast lobular breast cancer Left breast lumpectomy. Ax sn biopsy  We discussed the staging and pathophysiology of breast cancer. We discussed all of the different options for treatment for breast cancer including surgery, chemotherapy, radiation therapy, and antiestrogen therapy.  We discussed a sentinel lymph node biopsy as she does not appear to having lymph node involvement right now. We discussed the performance of that with injection of magtrace We discussed that there is a chance of having a positive node with a sentinel lymph node biopsy and we will await the permanent pathology to make any other first further decisions  in terms of her treatment. We discussed up to a 5% risk lifetime of chronic shoulder pain as well as lymphedema associated with a sentinel lymph node biopsy.  We discussed the options for treatment of the breast cancer which included lumpectomy versus a mastectomy. We discussed the performance of the lumpectomy with radioactive seed  placement. We discussed a 5-10% chance of a positive margin requiring reexcision in the operating room. We also discussed that she will likely need radiation therapy if she undergoes lumpectomy. We discussed mastectomy and the postoperative care for that as well. Mastectomy can be followed by reconstruction. The decision for lumpectomy vs mastectomy has no impact on decision for chemotherapy. Most mastectomy patients will not need radiation therapy. We discussed that there is no difference in her survival whether she undergoes lumpectomy with radiation therapy or antiestrogen therapy versus a mastectomy. There is also no real difference between her recurrence in the breast. I do think right now this is amenable to seed bracketed lumpectomy  We discussed the risks of operation including bleeding, infection, possible reoperation. She understands her further therapy will be based on what her stages at the time of her operation.

## 2024-02-05 ENCOUNTER — Ambulatory Visit (HOSPITAL_COMMUNITY): Admitting: Certified Registered Nurse Anesthetist

## 2024-02-05 ENCOUNTER — Ambulatory Visit (HOSPITAL_COMMUNITY): Payer: Self-pay | Admitting: Vascular Surgery

## 2024-02-05 ENCOUNTER — Ambulatory Visit (HOSPITAL_COMMUNITY)
Admission: RE | Admit: 2024-02-05 | Discharge: 2024-02-05 | Disposition: A | Discharge status: Home / Self Care | Attending: General Surgery | Admitting: General Surgery

## 2024-02-05 ENCOUNTER — Encounter (HOSPITAL_COMMUNITY)
Admission: RE | Disposition: A | Discharge status: Home / Self Care | Payer: Self-pay | Source: Home / Self Care | Attending: General Surgery

## 2024-02-05 ENCOUNTER — Other Ambulatory Visit: Payer: Self-pay

## 2024-02-05 ENCOUNTER — Ambulatory Visit
Admission: RE | Admit: 2024-02-05 | Discharge: 2024-02-05 | Disposition: A | Source: Ambulatory Visit | Attending: General Surgery | Admitting: General Surgery

## 2024-02-05 ENCOUNTER — Ambulatory Visit
Admission: RE | Admit: 2024-02-05 | Discharge: 2024-02-05 | Disposition: A | Discharge status: Home / Self Care | Source: Ambulatory Visit | Attending: General Surgery | Admitting: General Surgery

## 2024-02-05 DIAGNOSIS — C50912 Malignant neoplasm of unspecified site of left female breast: Secondary | ICD-10-CM | POA: Diagnosis not present

## 2024-02-05 DIAGNOSIS — Z170 Estrogen receptor positive status [ER+]: Secondary | ICD-10-CM | POA: Diagnosis not present

## 2024-02-05 DIAGNOSIS — E119 Type 2 diabetes mellitus without complications: Secondary | ICD-10-CM | POA: Insufficient documentation

## 2024-02-05 DIAGNOSIS — Z1732 Human epidermal growth factor receptor 2 negative status: Secondary | ICD-10-CM | POA: Insufficient documentation

## 2024-02-05 DIAGNOSIS — Z1721 Progesterone receptor positive status: Secondary | ICD-10-CM | POA: Insufficient documentation

## 2024-02-05 DIAGNOSIS — C773 Secondary and unspecified malignant neoplasm of axilla and upper limb lymph nodes: Secondary | ICD-10-CM | POA: Diagnosis not present

## 2024-02-05 DIAGNOSIS — C50212 Malignant neoplasm of upper-inner quadrant of left female breast: Secondary | ICD-10-CM | POA: Diagnosis not present

## 2024-02-05 DIAGNOSIS — I10 Essential (primary) hypertension: Secondary | ICD-10-CM

## 2024-02-05 DIAGNOSIS — G8918 Other acute postprocedural pain: Secondary | ICD-10-CM | POA: Diagnosis not present

## 2024-02-05 DIAGNOSIS — Z87891 Personal history of nicotine dependence: Secondary | ICD-10-CM | POA: Diagnosis not present

## 2024-02-05 DIAGNOSIS — Z7984 Long term (current) use of oral hypoglycemic drugs: Secondary | ICD-10-CM | POA: Insufficient documentation

## 2024-02-05 DIAGNOSIS — D0502 Lobular carcinoma in situ of left breast: Secondary | ICD-10-CM | POA: Diagnosis not present

## 2024-02-05 HISTORY — PX: BREAST LUMPECTOMY WITH RADIOACTIVE SEED AND SENTINEL LYMPH NODE BIOPSY: SHX6550

## 2024-02-05 LAB — GLUCOSE, CAPILLARY
Glucose-Capillary: 144 mg/dL — ABNORMAL HIGH (ref 70–99)
Glucose-Capillary: 134 mg/dL — ABNORMAL HIGH (ref 70–99)

## 2024-02-05 SURGERY — BREAST LUMPECTOMY WITH RADIOACTIVE SEED AND SENTINEL LYMPH NODE BIOPSY
Anesthesia: Regional | Site: Breast | Laterality: Left

## 2024-02-05 MED ORDER — ACETAMINOPHEN 500 MG PO TABS
1000.0000 mg | ORAL_TABLET | ORAL | Status: AC
  Administered 2024-02-05: 1000 mg via ORAL

## 2024-02-05 MED ORDER — PROPOFOL 500 MG/50ML IV EMUL
INTRAVENOUS | Status: DC | PRN
  Administered 2024-02-05: 150 ug/kg/min via INTRAVENOUS

## 2024-02-05 MED ORDER — CHLORHEXIDINE GLUCONATE 0.12 % MT SOLN
15.0000 mL | Freq: Once | OROMUCOSAL | Status: AC
  Administered 2024-02-05: 15 mL via OROMUCOSAL
  Filled 2024-02-05: qty 15

## 2024-02-05 MED ORDER — ONDANSETRON HCL 4 MG/2ML IJ SOLN: 4.0000 mg | Freq: Once | INTRAMUSCULAR | Status: DC | PRN

## 2024-02-05 MED ORDER — TRAMADOL HCL 50 MG PO TABS: 50.0000 mg | ORAL_TABLET | Freq: Four times a day (QID) | ORAL | 0 refills | Status: DC | PRN

## 2024-02-05 MED ORDER — MIDAZOLAM HCL 2 MG/2ML IJ SOLN
INTRAMUSCULAR | Status: DC | PRN
  Administered 2024-02-05 (×2): 1 mg via INTRAVENOUS

## 2024-02-05 MED ORDER — LACTATED RINGERS IV SOLN: INTRAVENOUS | Status: DC

## 2024-02-05 MED ORDER — BUPIVACAINE-EPINEPHRINE 0.25% -1:200000 IJ SOLN
INTRAMUSCULAR | Status: DC | PRN
  Administered 2024-02-05: 6 mL

## 2024-02-05 MED ORDER — HYDROMORPHONE HCL 1 MG/ML IJ SOLN: 0.2500 mg | INTRAMUSCULAR | Status: DC | PRN

## 2024-02-05 MED ORDER — AMISULPRIDE (ANTIEMETIC) 5 MG/2ML IV SOLN: 10.0000 mg | Freq: Once | INTRAVENOUS | Status: DC | PRN

## 2024-02-05 MED ORDER — OXYCODONE HCL 5 MG PO TABS
5.0000 mg | ORAL_TABLET | Freq: Once | ORAL | Status: AC | PRN
  Administered 2024-02-05: 5 mg via ORAL

## 2024-02-05 MED ORDER — CHLORHEXIDINE GLUCONATE CLOTH 2 % EX PADS: 6.0000 | MEDICATED_PAD | Freq: Once | CUTANEOUS | Status: DC

## 2024-02-05 MED ORDER — LIDOCAINE 2% (20 MG/ML) 5 ML SYRINGE
INTRAMUSCULAR | Status: AC
  Filled 2024-02-05: qty 5

## 2024-02-05 MED ORDER — MIDAZOLAM HCL 2 MG/2ML IJ SOLN
INTRAMUSCULAR | Status: AC
  Filled 2024-02-05: qty 2

## 2024-02-05 MED ORDER — ALBUTEROL SULFATE HFA 108 (90 BASE) MCG/ACT IN AERS
INHALATION_SPRAY | RESPIRATORY_TRACT | Status: DC | PRN
  Administered 2024-02-05: 6 via RESPIRATORY_TRACT

## 2024-02-05 MED ORDER — OXYCODONE HCL 5 MG PO TABS
ORAL_TABLET | ORAL | Status: AC
  Filled 2024-02-05: qty 1

## 2024-02-05 MED ORDER — ENSURE PRE-SURGERY PO LIQD: 296.0000 mL | Freq: Once | ORAL | Status: DC

## 2024-02-05 MED ORDER — CHLORHEXIDINE GLUCONATE CLOTH 2 % EX PADS
6.0000 | MEDICATED_PAD | Freq: Once | CUTANEOUS | Status: AC
Start: 2024-02-05 — End: 2024-02-05
  Administered 2024-02-05: 6 via TOPICAL

## 2024-02-05 MED ORDER — MAGTRACE LYMPHATIC TRACER
INTRAMUSCULAR | Status: DC | PRN
  Administered 2024-02-05: 2 mL via INTRAMUSCULAR

## 2024-02-05 MED ORDER — INSULIN ASPART 100 UNIT/ML IJ SOLN
0.0000 [IU] | INTRAMUSCULAR | Status: DC | PRN
  Administered 2024-02-05: 2 [IU] via SUBCUTANEOUS
  Filled 2024-02-05: qty 1

## 2024-02-05 MED ORDER — ONDANSETRON HCL 4 MG/2ML IJ SOLN
INTRAMUSCULAR | Status: AC
  Filled 2024-02-05: qty 2

## 2024-02-05 MED ORDER — LIDOCAINE 2% (20 MG/ML) 5 ML SYRINGE
INTRAMUSCULAR | Status: DC | PRN
  Administered 2024-02-05: 40 mg via INTRAVENOUS

## 2024-02-05 MED ORDER — FENTANYL CITRATE (PF) 250 MCG/5ML IJ SOLN
INTRAMUSCULAR | Status: AC
  Filled 2024-02-05: qty 5

## 2024-02-05 MED ORDER — ACETAMINOPHEN 500 MG PO TABS
1000.0000 mg | ORAL_TABLET | Freq: Once | ORAL | Status: DC
  Filled 2024-02-05: qty 2

## 2024-02-05 MED ORDER — PROPOFOL 10 MG/ML IV BOLUS
INTRAVENOUS | Status: AC
  Filled 2024-02-05: qty 20

## 2024-02-05 MED ORDER — PHENYLEPHRINE HCL (PRESSORS) 10 MG/ML IV SOLN
INTRAVENOUS | Status: DC | PRN
  Administered 2024-02-05: 120 ug via INTRAVENOUS
  Administered 2024-02-05: 80 ug via INTRAVENOUS

## 2024-02-05 MED ORDER — ONDANSETRON HCL 4 MG/2ML IJ SOLN
INTRAMUSCULAR | Status: DC | PRN
Start: 2024-02-05 — End: 2024-02-05
  Administered 2024-02-05: 4 mg via INTRAVENOUS

## 2024-02-05 MED ORDER — CEFAZOLIN SODIUM-DEXTROSE 2-4 GM/100ML-% IV SOLN
2.0000 g | INTRAVENOUS | Status: AC
  Administered 2024-02-05: 2 g via INTRAVENOUS
  Filled 2024-02-05: qty 100

## 2024-02-05 MED ORDER — OXYCODONE HCL 5 MG/5ML PO SOLN: 5.0000 mg | Freq: Once | ORAL | Status: AC | PRN

## 2024-02-05 MED ORDER — PHENYLEPHRINE HCL-NACL 20-0.9 MG/250ML-% IV SOLN
INTRAVENOUS | Status: DC | PRN
Start: 2024-02-05 — End: 2024-02-05
  Administered 2024-02-05: 35 ug/min via INTRAVENOUS

## 2024-02-05 MED ORDER — DEXAMETHASONE SODIUM PHOSPHATE 10 MG/ML IJ SOLN
INTRAMUSCULAR | Status: AC
  Filled 2024-02-05: qty 1

## 2024-02-05 MED ORDER — FENTANYL CITRATE (PF) 250 MCG/5ML IJ SOLN
INTRAMUSCULAR | Status: DC | PRN
  Administered 2024-02-05: 50 ug via INTRAVENOUS
  Administered 2024-02-05 (×2): 25 ug via INTRAVENOUS

## 2024-02-05 MED ORDER — ROPIVACAINE HCL 5 MG/ML IJ SOLN
INTRAMUSCULAR | Status: DC | PRN
Start: 2024-02-05 — End: 2024-02-05
  Administered 2024-02-05: 30 mL via PERINEURAL

## 2024-02-05 MED ORDER — DEXAMETHASONE SODIUM PHOSPHATE 10 MG/ML IJ SOLN
INTRAMUSCULAR | Status: DC | PRN
  Administered 2024-02-05: 10 mg via PERINEURAL

## 2024-02-05 MED ORDER — ORAL CARE MOUTH RINSE: 15.0000 mL | Freq: Once | OROMUCOSAL | Status: AC

## 2024-02-05 MED ORDER — 0.9 % SODIUM CHLORIDE (POUR BTL) OPTIME
TOPICAL | Status: DC | PRN
  Administered 2024-02-05: 1000 mL

## 2024-02-05 MED ORDER — DEXAMETHASONE SODIUM PHOSPHATE 10 MG/ML IJ SOLN
INTRAMUSCULAR | Status: DC | PRN
  Administered 2024-02-05: 5 mg via INTRAVENOUS

## 2024-02-05 MED ORDER — BUPIVACAINE-EPINEPHRINE (PF) 0.25% -1:200000 IJ SOLN
INTRAMUSCULAR | Status: AC
  Filled 2024-02-05: qty 30

## 2024-02-05 MED ORDER — ROCURONIUM BROMIDE 10 MG/ML (PF) SYRINGE
PREFILLED_SYRINGE | INTRAVENOUS | Status: AC
  Filled 2024-02-05: qty 10

## 2024-02-05 MED ORDER — PROPOFOL 10 MG/ML IV BOLUS
INTRAVENOUS | Status: DC | PRN
  Administered 2024-02-05: 150 mg via INTRAVENOUS

## 2024-02-05 SURGICAL SUPPLY — 32 items
CANISTER SUCTION 3000ML PPV (SUCTIONS) ×2 IMPLANT
CHLORAPREP W/TINT 26 (MISCELLANEOUS) ×2 IMPLANT
CLIP APPLIE 9.375 MED OPEN (MISCELLANEOUS) ×2 IMPLANT
COVER PROBE W GEL 5X96 (DRAPES) ×4 IMPLANT
COVER SURGICAL LIGHT HANDLE (MISCELLANEOUS) ×2 IMPLANT
DERMABOND ADVANCED .7 DNX12 (GAUZE/BANDAGES/DRESSINGS) ×2 IMPLANT
DEVICE DUBIN SPECIMEN MAMMOGRA (MISCELLANEOUS) ×2 IMPLANT
DRAPE CHEST BREAST 15X10 FENES (DRAPES) ×2 IMPLANT
ELECT COATED BLADE 2.86 ST (ELECTRODE) ×2 IMPLANT
ELECTRODE REM PT RTRN 9FT ADLT (ELECTROSURGICAL) ×2 IMPLANT
GLOVE BIO SURGEON STRL SZ7 (GLOVE) ×2 IMPLANT
GLOVE BIOGEL PI IND STRL 7.5 (GLOVE) ×2 IMPLANT
GOWN STRL REUS W/ TWL LRG LVL3 (GOWN DISPOSABLE) ×4 IMPLANT
KIT BASIN OR (CUSTOM PROCEDURE TRAY) ×2 IMPLANT
KIT MARKER MARGIN INK (KITS) ×2 IMPLANT
NDL 18GX1X1/2 (RX/OR ONLY) (NEEDLE) IMPLANT
NDL FILTER BLUNT 18X1 1/2 (NEEDLE) IMPLANT
NDL HYPO 25GX1X1/2 BEV (NEEDLE) ×2 IMPLANT
NEEDLE 18GX1X1/2 (RX/OR ONLY) (NEEDLE) ×1 IMPLANT
NEEDLE FILTER BLUNT 18X1 1/2 (NEEDLE) ×1 IMPLANT
NEEDLE HYPO 25GX1X1/2 BEV (NEEDLE) ×1 IMPLANT
PACK GENERAL/GYN (CUSTOM PROCEDURE TRAY) ×2 IMPLANT
SOLN 0.9% NACL 1000 ML (IV SOLUTION) ×1 IMPLANT
SOLN 0.9% NACL POUR BTL 1000ML (IV SOLUTION) ×2 IMPLANT
STRIP CLOSURE SKIN 1/2X4 (GAUZE/BANDAGES/DRESSINGS) ×2 IMPLANT
SUT MNCRL AB 4-0 PS2 18 (SUTURE) ×4 IMPLANT
SUT MON AB 5-0 PS2 18 (SUTURE) IMPLANT
SUT SILK 2 0 SH (SUTURE) IMPLANT
SUT VIC AB 2-0 SH 27XBRD (SUTURE) ×4 IMPLANT
SUT VIC AB 3-0 SH 27X BRD (SUTURE) ×4 IMPLANT
SYR CONTROL 10ML LL (SYRINGE) ×2 IMPLANT
TOWEL GREEN STERILE (TOWEL DISPOSABLE) ×2 IMPLANT

## 2024-02-05 NOTE — Discharge Instructions (Signed)
 [facility] Office Phone Number [phone]  POST OP INSTRUCTIONS Take 400 mg of ibuprofen every 8 hours or 650 mg tylenol  every 6 hours for next 72 hours then as needed. Use ice several times daily also.  A prescription for pain medication may be given to you upon discharge.  Take your pain medication as prescribed, if needed.  If narcotic pain medicine is not needed, then you may take acetaminophen  (Tylenol ), naprosyn (Alleve) or ibuprofen (Advil) as needed. Take your usually prescribed medications unless otherwise directed If you need a refill on your pain medication, please contact your pharmacy.  They will contact our office to request authorization.  Prescriptions will not be filled after 5pm or on week-ends. You should eat very light the first 24 hours after surgery, such as soup, crackers, pudding, etc.  Resume your normal diet the day after surgery. Most patients will experience some swelling and bruising in the breast.  Ice packs and a good support bra will help.  Wear the breast binder provided or a sports bra for 72 hours day and night.  After that wear a sports bra during the day until you return to the office. Swelling and bruising can take several days to resolve.  It is common to experience some constipation if taking pain medication after surgery.  Increasing fluid intake and taking a stool softener will usually help or prevent this problem from occurring.  A mild laxative (Milk of Magnesia or Miralax) should be taken according to package directions if there are no bowel movements after 48 hours. I used skin glue on the incision, you may shower in 24 hours.  The glue will flake off over the next 2-3 weeks as will the steristrips.   Any sutures or staples will be removed at the office during your follow-up visit. ACTIVITIES:  You may resume regular daily activities (gradually increasing) beginning the next day.  Wearing a good support bra or sports bra minimizes pain and  swelling.  You may have sexual intercourse when it is comfortable. You may drive when you no longer are taking prescription pain medication, you can comfortably wear a seatbelt, and you can safely maneuver your car and apply brakes. RETURN TO WORK:  ______________________________________________________________________________________ Stephanie Neal should see your doctor in the office for a follow-up appointment approximately two to three weeks after your surgery.  Your doctor's nurse will typically make your follow-up appointment when she calls you with your pathology report.  Expect your pathology report 3-4 business days after your surgery.  You may call to check if you do not hear from us  after three days.  WHEN TO CALL DR Stephanie Neal: Fever over 101.0 Nausea and/or vomiting. Extreme swelling or bruising. Continued bleeding from incision. Increased pain, redness, or drainage from the incision.  The clinic staff is available to answer your questions during regular business hours.  Please don't hesitate to call and ask to speak to one of the nurses for clinical concerns.  If you have a medical emergency, go to the nearest emergency room or call 911.  A surgeon from [facility] is always on call at the hospital.  For further questions, please visit [email or web address]

## 2024-02-05 NOTE — Op Note (Signed)
 Preoperative diagnosis: clinical stage I left breast cancer Postoperative diagnosis: saa Procedure: Left breast seed guided lumpectomy Magtrace injection for sentinel node biopsy Left deep axillary sentinel node biopsy Surgeon: Dr Adina Bury Anesthesia: general Complications none Drains none Specimens:  Left breast tissue containing two seeds and two clips Additional posterior and medial margins marked short superior, long lateral, double deep Left deep axillary sentinel nodes with highest count over 4000 Sponge and needle count correct Dispo recovery stable  Indications: 46 yof who had screening mm that shows a left breast distortion. 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 She has c density breast tissue. She has a 9x8x7 mm at 930, 1.3x1.3x0.9 cm mass at 11 oclock. Axillary us  is negative. Clips are 2.3 cm apart. Biopsy is a grade II ILC that is 100% er pos, 25% pr pos, her 2 negative and Ki is 10% we discussed seed bracketed lumpectomy and sn biopsy.  Procedure: After informed consent was obtained she was taken to the operating room.  She first underwent a pec block with anesthesia.  She was given antibiotics.  SCDs were placed.  She had 2 seeds placed prior to begin I had these mammograms available in the operating room.  She was placed under general anesthesia.  She was then prepped and draped in standard sterile surgical fashion.  Surgical timeout was then performed.  I injected 2 cc of mag trace in the subareolar position and massaged this for 5 minutes for later sentinel lymph node identification.  Both seeds were in the central breast.  I made a periareolar incision in order to hide the scar later.  I used the neoprobe to identify both seeds and dissected around these to remove the tumors as well as a clear margin around them.  Mammogram confirmed removal of both seeds and both clips.  I did use the 3D imaging to take a couple more over the margins as above.  All of my margins look clear upon completion.   I then obtained hemostasis.  I placed a couple of clips in the cavity.  I then mobilized the breast tissue and closed with 2-0 Vicryl.  The skin was closed with 3-0 Vicryl and 5-0 Monocryl.  Glue and Steri-Strips were eventually applied.  I then made an incision below the axillary hairline.  I carried this to the axillary fascia.  I then was able to identify several sentinel lymph nodes that had activity as well as they were brown from the mag trace.  I remove these.  These were passed off the table.  There was no other activity present in the axilla and there were no other enlarged or brown lymph nodes.  I then obtained hemostasis.  I closed the axillary fascia with 2-0 Vicryl.  Skin was closed with 3-0 Vicryl for Monocryl.  Glue and Steri-Strips were placed.  She tolerated this well was extubated and transferred recovery stable.

## 2024-02-05 NOTE — Transfer of Care (Signed)
 Immediate Anesthesia Transfer of Care Note  Patient: Engineer, materials  Procedure(s) Performed: BREAST LUMPECTOMY WITH RADIOACTIVE SEED AND SENTINEL LYMPH NODE BIOPSY (Left: Breast)  Patient Location: PACU  Anesthesia Type:General  Level of Consciousness: drowsy  Airway & Oxygen Therapy: Patient Spontanous Breathing and Patient connected to face mask oxygen  Post-op Assessment: Report given to RN and Post -op Vital signs reviewed and unstable, Anesthesiologist notified  Post vital signs: Reviewed and stable  Last Vitals:  Vitals Value Taken Time  BP 107/96 02/05/24 09:09  Temp    Pulse 74 02/05/24 09:11  Resp 16 02/05/24 09:11  SpO2 97 % 02/05/24 09:11  Vitals shown include unfiled device data.  Last Pain:  Vitals:   02/05/24 0644  PainSc: 0-No pain         Complications: No notable events documented.

## 2024-02-05 NOTE — Interval H&P Note (Signed)
 History and Physical Interval Note:  02/05/2024 7:34 AM  Stephanie Neal  has presented today for surgery, with the diagnosis of LEFT BREAST CANCER.  The various methods of treatment have been discussed with the patient and family. After consideration of risks, benefits and other options for treatment, the patient has consented to  Procedure(s) with comments: BREAST LUMPECTOMY WITH RADIOACTIVE SEED AND SENTINEL LYMPH NODE BIOPSY (Left) - GEN w/PEC BLOCK LEFT BREAST RADIOACTIVE SEED LOCALIZED LUMPECTOMY x2 LEFT AXILLARY SENTINEL NODE BIOPSY as a surgical intervention.  The patient's history has been reviewed, patient examined, no change in status, stable for surgery.  I have reviewed the patient's chart and labs.  Questions were answered to the patient's satisfaction.     Donnice Bury

## 2024-02-05 NOTE — Anesthesia Postprocedure Evaluation (Signed)
 Anesthesia Post Note  Patient: Stephanie Neal  Procedure(s) Performed: BREAST LUMPECTOMY WITH RADIOACTIVE SEED AND SENTINEL LYMPH NODE BIOPSY (Left: Breast)     Patient location during evaluation: PACU Anesthesia Type: Regional and General Level of consciousness: awake and alert, oriented and patient cooperative Pain management: pain level controlled Vital Signs Assessment: post-procedure vital signs reviewed and stable Respiratory status: spontaneous breathing, nonlabored ventilation and respiratory function stable Cardiovascular status: blood pressure returned to baseline and stable Postop Assessment: no apparent nausea or vomiting Anesthetic complications: no   No notable events documented.  Last Vitals:  Vitals:   02/05/24 0930 02/05/24 0939  BP: 135/80 127/63  Pulse: 92 84  Resp: 18 20  Temp:  36.6 C  SpO2: 95% 94%    Last Pain:  Vitals:   02/05/24 0930  PainSc: 0-No pain                 Almarie HERO Nigel Ericsson

## 2024-02-05 NOTE — Anesthesia Procedure Notes (Signed)
 Procedure Name: LMA Insertion Date/Time: 02/05/2024 7:42 AM  Performed by: Mannie Krystal LABOR, CRNAPre-anesthesia Checklist: Patient identified, Emergency Drugs available, Suction available and Patient being monitored Patient Re-evaluated:Patient Re-evaluated prior to induction Oxygen Delivery Method: Circle system utilized Preoxygenation: Pre-oxygenation with 100% oxygen Induction Type: IV induction LMA: LMA inserted LMA Size: 4.0 Tube type: Oral Number of attempts: 1 Placement Confirmation: positive ETCO2 and breath sounds checked- equal and bilateral Tube secured with: Tape Dental Injury: Teeth and Oropharynx as per pre-operative assessment

## 2024-02-05 NOTE — Anesthesia Procedure Notes (Signed)
 Anesthesia Regional Block: Pectoralis block   Pre-Anesthetic Checklist: , timeout performed,  Correct Patient, Correct Site, Correct Laterality,  Correct Procedure, Correct Position, site marked,  Risks and benefits discussed,  Surgical consent,  Pre-op evaluation,  At surgeon's request and post-op pain management  Laterality: Left  Prep: Maximum Sterile Barrier Precautions used, chloraprep       Needles:  Injection technique: Single-shot  Needle Type: Echogenic Stimulator Needle     Needle Length: 9cm  Needle Gauge: 22     Additional Needles:   Procedures:,,,, ultrasound used (permanent image in chart),,    Narrative:  Start time: 02/05/2024 7:00 AM End time: 02/05/2024 7:05 AM Injection made incrementally with aspirations every 5 mL.  Performed by: Personally  Anesthesiologist: Merla Almarie HERO, DO  Additional Notes: Monitors applied. No increased pain on injection. No increased resistance to injection. Injection made in 5cc increments. Good needle visualization. Patient tolerated procedure well.

## 2024-02-06 ENCOUNTER — Encounter (HOSPITAL_COMMUNITY): Payer: Self-pay | Admitting: General Surgery

## 2024-02-08 ENCOUNTER — Inpatient Hospital Stay: Attending: Hematology and Oncology

## 2024-02-08 NOTE — Progress Notes (Signed)
 [facility] Clinical Social Work  Therapist, occupational Follow Up   CSW Intern contacted pt regarding her interest in Sports coach program. Pt reports that she is currently overwhelmed by volume of medical appointments and would prefer to postpone involvement in [employer] as well as other support services.     Other Psychosocial Needs: Patient reported stressors: Adjusting to my illness and overwhelm regarding volume of appointments to keep track of.  Current SDOH Barriers: None reported by pt at this time.    Interventions:  Informed patient of the support team roles and support services at [facility] Provided CSW contact information and encouraged patient to call with any questions or concerns  Follow Up:   CSW Intern will call patient in 2 weeks to check in and see if she is ready to speak with a peer mentor or has any other needs.    Thersia KATHEE Daring  Clinical Social Work Intern

## 2024-02-18 LAB — SURGICAL PATHOLOGY

## 2024-02-19 ENCOUNTER — Telehealth: Payer: Self-pay | Admitting: *Deleted

## 2024-02-19 ENCOUNTER — Encounter: Payer: Self-pay | Admitting: *Deleted

## 2024-02-19 NOTE — Telephone Encounter (Signed)
 Ordered oncotype per Dr. Pamelia Hoit. Sent requisition to pathology and exact sciences.

## 2024-02-20 ENCOUNTER — Ambulatory Visit: Payer: Self-pay | Admitting: Internal Medicine

## 2024-02-20 NOTE — Telephone Encounter (Signed)
 PC placed to pt today. Pt informed that I am aware of her breast CA dx and have been keeping up with notes from Dr. Odean and the surgeon. Just called to encourage her and to let her know she is in good hands with the oncology team. Advised to reach out to me if she needs anything that I can assist with. Pt was thankful for my call.

## 2024-02-22 ENCOUNTER — Inpatient Hospital Stay

## 2024-02-22 NOTE — Progress Notes (Signed)
 [facility] CSW Progress Note  Clinical Social Work Intern contacted patient by phone to follow-up on emotional support. Patient reports she is doing well and has no Clinical biochemist needs at this time. Intern called back per patient request to see if she is ready to speak with an Sports coach. Patient communicated that she has decided she would prefer to wait until after her radiation treatment is complete before speaking with her mentor, so that she can take one day at a time. Intern affirmed pt's decision to focus on treatment at this time, and let her know her peer mentor is available post-tx as well.    Interventions: Reminded patient of the support team roles and support services at [facility] Provided CSW contact information and encouraged patient to call with any questions or concerns Intern will inform peer mentor of pt's decision to hold off on mentorship      Follow Up Plan:  Patient will contact CSW with any support or resource needs    Thersia KATHEE Daring Clinical Social Worker [facility]

## 2024-02-26 ENCOUNTER — Encounter (HOSPITAL_COMMUNITY): Payer: Self-pay

## 2024-02-28 ENCOUNTER — Ambulatory Visit: Payer: Self-pay | Admitting: Internal Medicine

## 2024-03-04 ENCOUNTER — Telehealth: Payer: Self-pay

## 2024-03-04 ENCOUNTER — Encounter: Payer: Self-pay | Admitting: *Deleted

## 2024-03-04 ENCOUNTER — Inpatient Hospital Stay: Attending: Hematology and Oncology | Admitting: Hematology and Oncology

## 2024-03-04 VITALS — BP 160/86 | HR 92 | Temp 98.8°F | Resp 18 | Ht 71.0 in | Wt 172.2 lb

## 2024-03-04 DIAGNOSIS — Z170 Estrogen receptor positive status [ER+]: Secondary | ICD-10-CM | POA: Diagnosis not present

## 2024-03-04 DIAGNOSIS — C50212 Malignant neoplasm of upper-inner quadrant of left female breast: Secondary | ICD-10-CM | POA: Insufficient documentation

## 2024-03-04 NOTE — Progress Notes (Unsigned)
 HPI:  Stephanie Neal was previously seen in the [facility] due to a personal history of two primary breast cancers and concerns regarding a hereditary predisposition to cancer. Please refer to our prior cancer genetics clinic note for more information regarding our discussion, assessment and recommendations, at the time. Stephanie Neal's recent genetic test results were disclosed to her, as were recommendations warranted by these results. These results and recommendations are discussed in more detail below.  CANCER HISTORY:  Oncology History  Malignant neoplasm of upper-inner quadrant of left breast in female, estrogen receptor positive (HCC)  01/08/2024 Initial Diagnosis   Screening mammogram detected left breast distortion LIQ, ultrasound detected mass at 9:30 position: 0.9 cm, second mass at the 11 o'clock position 1.3 cm, axilla negative, biopsy: Grade 2 ILC, no LVI, ER 100%, PR 25%, Ki67 10%, HER2 1+ negative   01/16/2024 Cancer Staging   Staging form: Breast, AJCC 8th Edition - Clinical: Stage IA (cT1c, cN0, cM0, G2, ER+, PR+, HER2-) - Signed by [name], MD on 01/16/2024 Stage prefix: Initial diagnosis Histologic grading system: 3 grade system   01/29/2024 Genetic Testing   Patient has genetic testing done due to her personal history of breast cancer. Genetic Testing [facility]-Expanded+RNAinsight (77 genes) Report Date: 01/29/2024 Result: ALK,p.G107V (c.320G>T) - Variant of Uncertain Significance (VUS)  [facility] Cancer[facility]-Expanded + RNAinsight gene panel which includes sequencing, rearrangement, and RNA analysis for the following 77 genes: AIP, ALK, APC, ATM, AXIN2, BAP1, BARD1, BMPR1A, BRCA1, BRCA2, BRIP1, CDC73, CDH1, CDK4, CDKN1B, CDKN2A, CEBPA, CHEK2, CTNNA1, DDX41, DICER1, ETV6, FH, FLCN, GATA2, LZTR1, MAX, MBD4, MEN1, MET, MLH1, MSH2, MSH3, MSH6, MUTYH, NF1, NF2, NTHL1, PALB2, PHOX2B, PMS2, POT1, PRKAR1A, PTCH1, PTEN, RAD51C, RAD51D, RB1, RET, RPS20,  RUNX1, SDHA, SDHAF2, SDHB, SDHC, SDHD, SMAD4, SMARCA4, SMARCB1, SMARCE1, STK11, SUFU, TMEM127, TP53, TSC1, TSC2, VHL, and WT1 (sequencing and deletion/duplication); EGFR, HOXB13, KIT, MITF, PDGFRA, POLD1, and POLE (sequencing only); EPCAM and GREM1 (deletion/duplication only).      02/05/2024 Surgery   Left lumpectomy: Grade 2 ILC 3.5 cm with LCIS, margins negative, 1/4 lymph node positive for micrometastases, ER 100%, PR 25%, HER2 negative, Ki-67 10%    FAMILY HISTORY:  Stephanie Neal is the only person in her family to have a cancer diagnosis (see pedigree below).     GENETIC TEST RESULTS: Genetic testing reported out on 01/29/2024 through the [facility]Exp[facility] panel found no pathogenic mutations. The test report has been scanned into EPIC and is located under the Molecular Pathology section of the Results Review tab.  A portion of the result report is included below for reference.    We discussed with Stephanie Neal that because current genetic testing is not perfect, it is possible there may be a gene mutation in one of these genes that current testing cannot detect, but that chance is small.  We also discussed, that there could be another gene that has not yet been discovered, or that we have not yet tested, that is responsible for the cancer diagnoses in the family. It is also possible there is a hereditary cause for the cancer in the family that Stephanie Neal did not inherit and therefore was not identified in her testing.  Therefore, it is important to remain in touch with cancer genetics in the future so that we can continue to offer Stephanie Neal the most up to date genetic testing.   Genetic testing did identify a variant of uncertain significance (VUS) was identified in the ALK gene. The specific  variant is  p.G107V (c.320G>T).  At this time, it is unknown if this variant is associated with increased cancer risk or if this is a normal finding, but most variants such as this get  reclassified to being inconsequential. It should not be used to make medical management decisions. With time, we suspect the lab will determine the significance of this variant, if any. If we do learn more about it, we will try to contact Stephanie Neal to discuss it further. However, it is important to stay in touch with us  periodically and keep the address and phone number up to date.  ADDITIONAL GENETIC TESTING: We discussed with Stephanie Neal that there are other genes that are associated with increased cancer risk that can be analyzed. Should Stephanie Neal wish to pursue additional genetic testing, we are happy to discuss and coordinate this testing, at any time.    We discussed with Stephanie Neal that her genetic testing was fairly extensive. If there are genes identified to increase cancer risk that can be analyzed in the future, we would be happy to discuss and coordinate this testing at that time.    CANCER SCREENING RECOMMENDATIONS: Stephanie Neal test result is considered negative (normal).  This means that we have not identified a hereditary cause for her personal history of breast cancer at this time. Most cancers happen by chance and this negative test suggests that her personal history of cancer may fall into this category.    Possible reasons for Stephanie Neal's negative genetic test include:  1. There may be a gene mutation in one of these genes that current testing methods cannot detect but that chance is small.  2. There could be another gene that has not yet been discovered, or that we have not yet tested, that is responsible for the cancer diagnoses in the family.  3.  There may be no hereditary risk for cancer in the family. The cancers in Stephanie Neal and/or her family may be sporadic/familial or due to other genetic and environmental factors. 4. It is also possible there is a hereditary cause for the cancer in the family that Stephanie Neal did not inherit.  Therefore, it is  recommended she continue to follow the cancer management and screening guidelines provided by her oncology and primary healthcare provider. An individual's cancer risk and medical management are not determined by genetic test results alone. Overall cancer risk assessment incorporates additional factors, including personal medical history, family history, and any available genetic information that may result in a personalized plan for cancer prevention and surveillance  An individual's cancer risk and medical management are not determined by genetic test results alone. Overall cancer risk assessment incorporates additional factors, including personal medical history, family history, and any available genetic information that may result in a personalized plan for cancer prevention and surveillance.  RECOMMENDATIONS FOR FAMILY MEMBERS:  Individuals in this family might be at some increased risk of developing cancer, over the general population risk, simply due to the family history of cancer.  We recommended women in this family have a yearly mammogram beginning at age 84, or 54 years younger than the earliest onset of cancer, an annual clinical breast exam, and perform monthly breast self-exams. Women in this family should also have a gynecological exam as recommended by their primary provider. All family members should be referred for colonoscopy starting at age 61, or 64 years younger than the earliest onset of cancer.  FOLLOW-UP: Lastly, we discussed with Stephanie Neal that  cancer genetics is a rapidly advancing field and it is possible that new genetic tests will be appropriate for her and/or her family members in the future. We encouraged her to remain in contact with cancer genetics on an annual basis so we can update her personal and family histories and let her know of advances in cancer genetics that may benefit this family.   Our contact number was provided. Ms. Mahrt questions were answered to her  satisfaction, and she knows she is welcome to call us  at anytime with additional questions or concerns.  Santana Fryer, MS, CGC  Certified Genetic Counselor  Email: [email or web address]  Phone: [phone]

## 2024-03-04 NOTE — Assessment & Plan Note (Signed)
 01/08/2024:Screening mammogram detected left breast distortion LIQ, ultrasound detected mass at 9:30 position: 0.9 cm, second mass at the 11 o'clock position 1.3 cm, axilla negative, biopsy: Grade 2 ILC, no LVI, ER 100%, PR 25%, Ki67 10%, HER2 1+ negative    02/05/2024:Left lumpectomy: Grade 2 ILC 3.5 cm with LCIS, margins negative, 1/4 lymph node positive for micrometastases, ER 100%, PR 25%, HER2 negative, Ki-67 10% Oncotype DX recurrence score: 18 (4% risk of distant recurrence)   Recommendations: 1. Adjuvant radiation therapy followed by 2. Adjuvant antiestrogen therapy

## 2024-03-04 NOTE — Therapy (Signed)
 OUTPATIENT PHYSICAL THERAPY BREAST CANCER POST OP FOLLOW UP   Patient Name: Stephanie Neal MRN: [id number] DOB:02-12-1956, 68 y.o., female Today's Date: 03/05/2024  END OF SESSION:  PT End of Session - 03/05/24 1004     Visit Number 2    Number of Visits 10    Date for Recertification  04/02/24    PT Start Time 1003    PT Stop Time 1045    PT Time Calculation (min) 42 min    Activity Tolerance Patient tolerated treatment well    Behavior During Therapy WFL for tasks assessed/performed          Past Medical History:  Diagnosis Date   Allergy    Arthritis    Breast cancer (HCC) 01/08/2024   Diabetes mellitus    Dysphonia    polypoid corditis   Hyperlipidemia    Hypertension    Past Surgical History:  Procedure Laterality Date   BREAST BIOPSY Left 01/08/2024   US  LT BREAST BX W LOC DEV 1ST LESION IMG BX SPEC US  GUIDE 01/08/2024 [facility]   BREAST BIOPSY Left 01/08/2024   US  LT BREAST BX W LOC DEV EA ADD LESION IMG BX SPEC US  GUIDE 01/08/2024 [facility]   BREAST BIOPSY  02/01/2024   MM LT RADIOACTIVE SEED LOC MAMMO GUIDE 02/01/2024 [facility]   BREAST BIOPSY  02/01/2024   MM LT RADIOACTIVE SEED EA ADD LESION LOC MAMMO GUIDE 02/01/2024 [facility]   BREAST LUMPECTOMY WITH RADIOACTIVE SEED AND SENTINEL LYMPH NODE BIOPSY Left 02/05/2024   Procedure: BREAST LUMPECTOMY WITH RADIOACTIVE SEED AND SENTINEL LYMPH NODE BIOPSY;  Surgeon: Ebbie Cough, MD;  Location: [facility] OR;  Service: General;  Laterality: Left;  GEN w/PEC BLOCK LEFT BREAST RADIOACTIVE SEED LOCALIZED LUMPECTOMY x2 LEFT AXILLARY SENTINEL NODE BIOPSY   COLONOSCOPY     x 2 - 2015, 2020   FOOT SURGERY     MICROLARYNGOSCOPY N/A 01/18/2022   Procedure: MICROLARYNGOSCOPY WITH EXCISION OF POLYPS;  Surgeon: Jesus Oliphant, MD;  Location: [facility] OR;  Service: ENT;  Laterality: N/A;   THROAT SURGERY     Patient Active Problem List   Diagnosis Date Noted   Genetic testing 01/29/2024    Malignant neoplasm of upper-inner quadrant of left breast in female, estrogen receptor positive (HCC) 01/14/2024   Aortic atherosclerosis 05/05/2021   Tetanus, diphtheria, and acellular pertussis (Tdap) vaccination declined 05/10/2020   Hyperlipidemia associated with type 2 diabetes mellitus (HCC) 11/18/2019   Chronic pain of right knee 11/18/2019   Mixed hyperlipidemia 06/25/2018   Over weight 06/25/2018   Stress due to family tension 11/13/2017   DM type 2 (diabetes mellitus, type 2) (HCC) 03/05/2014   HYPERCHOLESTEROLEMIA 03/12/2007   OBESITY 03/12/2007   Tobacco dependence 03/12/2007   HTN (hypertension) 03/12/2007   ALLERGIC RHINITIS, SEASONAL 03/12/2007    PCP: Barnie Louder, MD  REFERRING PROVIDER: Dr. Cough Ebbie   REFERRING DIAG: Left breast cancer    THERAPY DIAG:  Stiffness of left shoulder, not elsewhere classified - Plan: PT plan of care cert/re-cert  Aftercare following surgery for neoplasm - Plan: PT plan of care cert/re-cert  Localized edema - Plan: PT plan of care cert/re-cert  Abnormal posture - Plan: PT plan of care cert/re-cert  Malignant neoplasm of upper-inner quadrant of left breast in female, estrogen receptor positive (HCC) - Plan: PT plan of care cert/re-cert  Rationale for Evaluation and Treatment: Rehabilitation  ONSET DATE: 11/16/2023  SUBJECTIVE:  SUBJECTIVE STATEMENT: I had some fluid drained about 2 weeks ago. It has stayed down. My ROM is good though. I am not having any other issues.   PERTINENT HISTORY:  Patient was diagnosed on 11/16/2023 with left grade 2 invasive lobular carcinoma breast cancer. It measures 1.3 cm and 9 mm and is located in the upper inner quadrant. It is ER/PR positive and HER2 negative with a Ki67 of 10%.   PATIENT GOALS:   Reassess how my recovery is going related to arm function, pain, and swelling.  PAIN:  Are you having pain? No  PRECAUTIONS: Recent Surgery, left UE Lymphedema risk,   RED FLAGS: None   ACTIVITY LEVEL / LEISURE: has been doing post op exercises, has been walking at least 2 miles   OBJECTIVE:   PATIENT SURVEYS:  QUICK DASH:  Quick Dash - 03/05/24 0001     Open a tight or new jar No difficulty    Do heavy household chores (wash walls, wash floors) No difficulty    Carry a shopping bag or briefcase No difficulty    Wash your back No difficulty    Use a knife to cut food No difficulty    Recreational activities in which you take some force or impact through your arm, shoulder, or hand (golf, hammering, tennis) No difficulty    During the past week, to what extent has your arm, shoulder or hand problem interfered with your normal social activities with family, friends, neighbors, or groups? Not at all    During the past week, to what extent has your arm, shoulder or hand problem limited your work or other regular daily activities Not at all    Arm, shoulder, or hand pain. None    Tingling (pins and needles) in your arm, shoulder, or hand None    Difficulty Sleeping No difficulty    DASH Score 0 %          Quick Dash - 03/05/24 0001     Open a tight or new jar No difficulty    Do heavy household chores (wash walls, wash floors) No difficulty    Carry a shopping bag or briefcase No difficulty    Wash your back No difficulty    Use a knife to cut food No difficulty    Recreational activities in which you take some force or impact through your arm, shoulder, or hand (golf, hammering, tennis) No difficulty    During the past week, to what extent has your arm, shoulder or hand problem interfered with your normal social activities with family, friends, neighbors, or groups? Not at all    During the past week, to what extent has your arm, shoulder or hand problem limited your work or  other regular daily activities Not at all    Arm, shoulder, or hand pain. None    Tingling (pins and needles) in your arm, shoulder, or hand None    Difficulty Sleeping No difficulty    DASH Score 0 %          OBSERVATIONS: Cording visible in L breast and axilla, seroma/scar tissue palpable in L axilla and in L breast just medial to aerola  POSTURE:  Forward head and rounded shoulders posture   UPPER EXTREMITY AROM/PROM:   A/PROM RIGHT   eval    Shoulder extension 52  Shoulder flexion 151  Shoulder abduction 170  Shoulder internal rotation 70  Shoulder external rotation 82                          (  Blank rows = not tested)   A/PROM LEFT   eval LEFT 03/05/24  Shoulder extension 48 70  Shoulder flexion 151 165  Shoulder abduction 172 165  Shoulder internal rotation 75 52  Shoulder external rotation 73 88                          (Blank rows = not tested)   CERVICAL AROM: All within normal limits   UPPER EXTREMITY STRENGTH: WNL   LYMPHEDEMA ASSESSMENTS (in cm):    LANDMARK RIGHT   eval  10 cm proximal to olecranon process 28.3  Olecranon process 25.4  10 cm proximal to ulnar styloid process 20.9  Just proximal to ulnar styloid process 15.9  Across hand at thumb web space 19.8  At base of 2nd digit 6.1  (Blank rows = not tested)   LANDMARK LEFT   eval LEFT 03/05/24  10 cm proximal to olecranon process 28.7 27.2  Olecranon process 25.2 25  10  cm proximal to ulnar styloid process 19.9 19.3  Just proximal to ulnar styloid process 15.9 16.5  Across hand at thumb web space 19.8 20.3  At base of 2nd digit 6.2 6  (Blank rows = not tested)    Surgery type/Date: 02/05/24 L breast lumpectomy and SLNB Number of lymph nodes removed: 1/4 Current/past treatment (chemo, radiation, hormone therapy): radiation and hormone therapy Other symptoms:  Heaviness/tightness Yes Pain No Pitting edema No Infections No Decreased scar mobility Yes Stemmer sign No  PATIENT  EDUCATION:  Education details: axillary cording, need for stretching, info to obtain pulleys, seroma education, using chip pack and foam in bra for additional compression, scar massage, ABC class Person educated: Patient Education method: Explanation and Handouts Education comprehension: verbalized understanding  HOME EXERCISE PROGRAM: Reviewed previously given post op HEP. Pulleys x 2 min in direction of flexion and abduction  ASSESSMENT:  CLINICAL IMPRESSION: Pt reports to PT today after having a L breast lumpectomy and SLNB on 02/05/24. One of 4 nodes was positive and pt will require radiation. She has numerous cords palpable in her L axilla and in her breast. There is an area of scar tissue/seroma in her medial breast with some swelling present. She has limited L shoulder abduction compared to baseline. She would benefit from skilled PT services to improve L shoulder ROM, instruct pt in self MLD for management of swelling to to help decrease cording and stretches and manual therapy to decrease cording.   Pt will benefit from skilled therapeutic intervention to improve on the following deficits: Decreased knowledge of precautions, impaired UE functional use, pain, decreased ROM, postural dysfunction.   PT treatment/interventions: ADL/Self care home management, [phone]- PT Re-evaluation, 97110-Therapeutic exercises, 97530- Therapeutic activity, [id number]- Neuromuscular re-education, 97535- Self Care, [id number]- Manual therapy, [phone]- Orthotic Initial, [phone]- Orthotic/Prosthetic subsequent, and Patient/Family education   GOALS: Goals reviewed with patient? Yes  GOALS MET AT EVAL:  GOALS Name Target Date Goal status  1 Pt will be able to verbalize understanding of pertinent lymphedema risk reduction practices relevant to her dx specifically related to skin care.  Baseline:  No knowledge Eval Achieved at eval  2 Pt will be able to return demo and/or verbalize understanding of the post op HEP related  to regaining shoulder ROM. Baseline:  No knowledge Eval Achieved at eval  3 Pt will be able to verbalize understanding of the importance of viewing the post op After Breast CA Class video for further lymphedema risk reduction education  and therapeutic exercise.  Baseline:  No knowledge Eval Achieved at eval   LONG TERM GOALS:  (STG=LTG)  GOALS Name Target Date  Goal status  1 Pt will demonstrate she has regained full shoulder ROM and function post operatively compared to baselines.  Baseline: 04/02/24 INITIAL  2 Pt will demonstrate 170 degrees of L shoulder abduction to allow her to reach out to the side without tightness. 04/02/24 INITIAL  3 Pt will demonstrate a 50% improvement in axillary and breast cording to decrease tightness and tenderness. 04/02/24 INITIAL  4 Pt will be independent in self MLD for management of breast swelling to decrease risk of infection. 04/02/24 INITIAL     PLAN:  PT FREQUENCY/DURATION: 2x/wk for 4 wks  PLAN FOR NEXT SESSION: pulleys, ball, MFR to cording in L breast and axilla, MLD to L breast for post op swelling and seroma   [facility]  [address], [address]  [place] [address]  [phone]  After Breast Cancer Class Video It is recommended you view the ABC class video to be educated on lymphedema risk reduction. This video lasts for about 30 minutes. It can be viewed on our website here: [email or web address]  Scar massage You can begin gentle scar massage to you incision sites. Gently place one hand on the incision and move the skin (without sliding on the skin) in various directions. Do this for a few minutes and then you can gently massage either coconut oil or vitamin E cream into the scars.  Compression garment You should continue wearing your compression bra until you feel like you no longer have swelling.  Home exercise Program Continue doing the  exercises you were given until you feel like you can do them without feeling any tightness at the end.   Walking Program Studies show that 30 minutes of walking per day (fast enough to elevate your heart rate) can significantly reduce the risk of a cancer recurrence. If you can't walk due to other medical reasons, we encourage you to find another activity you could do (like a stationary bike or water exercise).  Posture After breast cancer surgery, people frequently sit with rounded shoulders posture because it puts their incisions on slack and feels better. If you sit like this and scar tissue forms in that position, you can become very tight and have pain sitting or standing with good posture. Try to be aware of your posture and sit and stand up tall to heal properly.  Follow up PT: It is recommended you return every 3 months for the first 3 years following surgery to be assessed on the SOZO machine for an L-Dex score. This helps prevent clinically significant lymphedema in 95% of patients. These follow up screens are 10 minute appointments that you are not billed for.  [employer], PT 03/05/2024, 10:58 AM

## 2024-03-04 NOTE — Progress Notes (Signed)
 Patient Care Team: [name], MD as PCP - General (Internal Medicine) Gerome, Devere HERO, RN as Oncology Nurse Navigator Tyree Nanetta SAILOR, RN as Oncology Nurse Navigator Ebbie Cough, MD as Consulting Physician (General Surgery) [name], MD as Consulting Physician (Hematology and Oncology) Izell Domino, MD as Attending Physician (Radiation Oncology)  DIAGNOSIS:  Encounter Diagnosis  Name Primary?   Malignant neoplasm of upper-inner quadrant of left breast in female, estrogen receptor positive (HCC) Yes    SUMMARY OF ONCOLOGIC HISTORY: Oncology History  Malignant neoplasm of upper-inner quadrant of left breast in female, estrogen receptor positive (HCC)  01/08/2024 Initial Diagnosis   Screening mammogram detected left breast distortion LIQ, ultrasound detected mass at 9:30 position: 0.9 cm, second mass at the 11 o'clock position 1.3 cm, axilla negative, biopsy: Grade 2 ILC, no LVI, ER 100%, PR 25%, Ki67 10%, HER2 1+ negative   01/16/2024 Cancer Staging   Staging form: Breast, AJCC 8th Edition - Clinical: Stage IA (cT1c, cN0, cM0, G2, ER+, PR+, HER2-) - Signed by [name], MD on 01/16/2024 Stage prefix: Initial diagnosis Histologic grading system: 3 grade system   02/05/2024 Surgery   Left lumpectomy: Grade 2 ILC 3.5 cm with LCIS, margins negative, 1/4 lymph node positive for micrometastases, ER 100%, PR 25%, HER2 negative, Ki-67 10%     CHIEF COMPLIANT: Follow-up after recent lumpectomy  HISTORY OF PRESENT ILLNESS:  History of Present Illness Stephanie Neal is a 68 year old female with breast cancer who presents for post-surgical follow-up and discussion of treatment options.  She recently underwent surgery for breast cancer, with a 3.5 cm tumor excised. Final pathology revealed a larger tumor than initially suggested by mammogram. Four lymph nodes were removed, with one showing microscopic involvement. The cancer is estrogen receptor-positive,  progesterone receptor-positive, and HER2-negative. An Oncotype DX score of 18 was obtained.  Anastrozole 1 mg once daily is planned to reduce estrogen production. Potential side effects include hot flashes, joint stiffness, and osteoporosis risk.  Radiation therapy to the breast area is planned. Side effects may include a sunburn-like sensation and fatigue, expected to resolve within two to three weeks post-treatment.     ALLERGIES:  is allergic to penicillins.  MEDICATIONS:  Current Outpatient Medications  Medication Sig Dispense Refill   Accu-Chek Softclix Lancets lancets Use as instructed to check blood sugar once a day. E11.9 100 each 6   albuterol  (VENTOLIN  HFA) 108 (90 Base) MCG/ACT inhaler Inhale 2 puffs into the lungs every 4 (four) hours as needed for wheezing or shortness of breath. 1 each 0   amLODipine  (NORVASC ) 10 MG tablet TAKE 1 TABLET BY MOUTH EVERY DAY 90 tablet 1   atorvastatin  (LIPITOR) 80 MG tablet Take 1 tablet (80 mg total) by mouth daily. Discontinue the 40 mg Atorvastatin  90 tablet 1   Blood Glucose Monitoring Suppl (ACCU-CHEK GUIDE ME) w/Device KIT Use as directed 1 kit 0   Blood Pressure Monitoring (BLOOD PRESSURE CUFF) MISC Use to check blood pressure once daily. 1 each 0   fluticasone  (FLONASE ) 50 MCG/ACT nasal spray Place 1 spray into both nostrils daily. 16 g 6   glimepiride  (AMARYL ) 4 MG tablet Take 1 tablet (4 mg total) by mouth 2 (two) times daily. 180 tablet 1   glucose blood (ACCU-CHEK GUIDE TEST) test strip Use as instructed to check blood sugar once a day. E11.9 100 each 6   lisinopril  (ZESTRIL ) 30 MG tablet Take 1 tablet (30 mg total) by mouth daily. 90 tablet 1  metFORMIN  (GLUCOPHAGE ) 1000 MG tablet TAKE 1 TABLET BY MOUTH 2 (TWO) TIMES DAILY WITH A MEAL. FOR DIABETES 180 tablet 1   No current facility-administered medications for this visit.    PHYSICAL EXAMINATION: ECOG PERFORMANCE STATUS: 1 - Symptomatic but completely ambulatory  Vitals:    03/04/24 1100  BP: (!) 160/86  Pulse: 92  Resp: 18  Temp: 98.8 F (37.1 C)  SpO2: 98%   Filed Weights   03/04/24 1100  Weight: 172 lb 3.2 oz (78.1 kg)     LABORATORY DATA:  I have reviewed the data as listed    Latest Ref Rng & Units 01/16/2024   12:21 PM 02/08/2023   11:59 AM 12/21/2021   11:05 AM  CMP  Glucose 70 - 99 mg/dL 840  826  868   BUN 8 - 23 mg/dL 9  8  9    Creatinine 0.44 - 1.00 mg/dL 9.11  9.21  9.25   Sodium 135 - 145 mmol/L 141  142  141   Potassium 3.5 - 5.1 mmol/L 3.5  4.1  4.1   Chloride 98 - 111 mmol/L 105  103  103   CO2 22 - 32 mmol/L 31  24  25    Calcium  8.9 - 10.3 mg/dL 8.9  9.3  9.3   Total Protein 6.5 - 8.1 g/dL 6.7  6.4  6.2   Total Bilirubin 0.0 - 1.2 mg/dL 0.4  <9.7  0.3   Alkaline Phos 38 - 126 U/L 59  77  58   AST 15 - 41 U/L 10  15  14    ALT 0 - 44 U/L 9  9  10      Lab Results  Component Value Date   WBC 6.2 01/16/2024   HGB 12.7 01/16/2024   HCT 36.6 01/16/2024   MCV 79.2 (L) 01/16/2024   PLT 221 01/16/2024   NEUTROABS 4.3 01/16/2024    ASSESSMENT & PLAN:  Malignant neoplasm of upper-inner quadrant of left breast in female, estrogen receptor positive (HCC) 01/08/2024:Screening mammogram detected left breast distortion LIQ, ultrasound detected mass at 9:30 position: 0.9 cm, second mass at the 11 o'clock position 1.3 cm, axilla negative, biopsy: Grade 2 ILC, no LVI, ER 100%, PR 25%, Ki67 10%, HER2 1+ negative    02/05/2024:Left lumpectomy: Grade 2 ILC 3.5 cm with LCIS, margins negative, 1/4 lymph node positive for micrometastases, ER 100%, PR 25%, HER2 negative, Ki-67 10% Oncotype DX recurrence score: 18 (4% risk of distant recurrence)   Recommendations: 1. Adjuvant radiation therapy followed by 2. Adjuvant antiestrogen therapy  Anastrozole counseling: We discussed the risks and benefits of anti-estrogen therapy with aromatase inhibitors. These include but not limited to insomnia, hot flashes, mood changes, vaginal dryness, bone  density loss, and weight gain. We strongly believe that the benefits far outweigh the risks. Patient understands these risks and consented to starting treatment. Planned treatment duration is 5-7 years.  Will request radiation oncology consultation. Follow-up after radiation to start antiestrogen therapy. All of their questions have been answered.   No orders of the defined types were placed in this encounter.  The patient has a good understanding of the overall plan. she agrees with it. she will call with any problems that may develop before the next visit here.  I personally spent a total of 30 minutes in the care of the patient today including preparing to see the patient, getting/reviewing separately obtained history, performing a medically appropriate exam/evaluation, counseling and educating, placing orders, referring and communicating with  other health care professionals, documenting clinical information in the EHR, independently interpreting results, communicating results, and coordinating care.   Viinay K Keyonda Bickle, MD 03/04/24

## 2024-03-04 NOTE — Telephone Encounter (Signed)
 I contacted  Ms. Jim to discuss her genetic testing results. No pathogenic variants were identified in the 77 genes analyzed. One variant of uncertain significance (VUS) was identified.   Her genetic testing did identify a variant of uncertain significance (VUS) in the ALK gene. At this time, it is unknown if this variant is associated with increased cancer risk or if this is a normal finding, but most variants such as this get reclassified to non-disease causing or benign. This uncertain result should not be used to make medical management decisions. We discussed that over time, we suspect the lab will determine the significance of this variant, if any. If we do learn more about it, we will try to contact you to discuss it further. However, it is important to stay in touch with us  periodically and keep the address and phone number up to date.    Discussed that we do not know why she has  cancer or why there is cancer in the family. It could be due to a different gene that we are not testing, or maybe our current technology may not be able to pick something up.  It will be important for her to keep in contact with genetics to keep up with whether additional testing may be needed.Detailed clinic note to follow.   The test report will be scanned into EPIC and will be located under the Molecular Pathology section of the Results Review tab.  A portion of the result report is included below for reference.     Santana Fryer, MS, CGC  Certified Genetic Counselor  Email: [email or web address]  Phone: [phone]

## 2024-03-05 ENCOUNTER — Encounter: Payer: Self-pay | Admitting: Physical Therapy

## 2024-03-05 ENCOUNTER — Ambulatory Visit: Attending: General Surgery | Admitting: Physical Therapy

## 2024-03-05 DIAGNOSIS — R293 Abnormal posture: Secondary | ICD-10-CM | POA: Insufficient documentation

## 2024-03-05 DIAGNOSIS — Z483 Aftercare following surgery for neoplasm: Secondary | ICD-10-CM | POA: Insufficient documentation

## 2024-03-05 DIAGNOSIS — C50212 Malignant neoplasm of upper-inner quadrant of left female breast: Secondary | ICD-10-CM | POA: Insufficient documentation

## 2024-03-05 DIAGNOSIS — Z170 Estrogen receptor positive status [ER+]: Secondary | ICD-10-CM | POA: Insufficient documentation

## 2024-03-05 DIAGNOSIS — R600 Localized edema: Secondary | ICD-10-CM | POA: Insufficient documentation

## 2024-03-05 DIAGNOSIS — M25612 Stiffness of left shoulder, not elsewhere classified: Secondary | ICD-10-CM | POA: Insufficient documentation

## 2024-03-06 ENCOUNTER — Encounter: Payer: Self-pay | Admitting: Radiation Oncology

## 2024-03-06 NOTE — Progress Notes (Signed)
 Location of Breast Cancer: Malignant Neoplasm of Upper-Inner Quadrant of Left Breast, Estrogen Receptor Positive   Histology per Pathology Report:    Receptor Status: ER(100%), PR (25%), Her2-neu (Negative), Ki-67(10%)  Did patient present with symptoms (if so, please note symptoms) or was this found on screening mammography?:  01/23/2024 Mammogram  Past/Anticipated interventions by surgeon, if any: 02/05/2024 Ebbie, MD Left Breast Radioactive Seed Localized Lumpectomy Left Axillary Sentinel Node Biopsy   Past/Anticipated interventions by medical oncology, if any:  03/04/2024 [name],MD  Lymphedema issues, if any:    None  Pain issues, if any:   None  SAFETY ISSUES: Prior radiation? None Pacemaker/ICD? None Possible current pregnancy?None Is the patient on methotrexate? None  Current Complaints / other details:  None

## 2024-03-06 NOTE — Progress Notes (Signed)
 Radiation Oncology         [phone] ________________________________  Name: Stephanie Neal MRN: [id number]  Date: 03/07/2024  DOB: 07-30-1955  Follow-Up Visit Note  Outpatient  CC: Stephanie Barnie NOVAK, MD  Stephanie Potts, MD  Diagnosis:   No diagnosis found.   Stage IA Left Breast UIQ (Two Masses), Invasive and in situ lobular carcinoma with a single left axillary SLN involved, ER+ / PR+ / Her2-, Grade 2: s/p left breast lumpectomy with left axillary SLN excisions   CHIEF COMPLAINT: Here to discuss management of left breast cancer  Narrative:  The patient returns today for follow-up.     On her breast clinic consultation date of 01/16/24, she opted to pursue genetic testing. Results (reported on 01/29/24) showed no clinically significant variants detected by BRCA1/2 analyses with +RNAinsight analyses.    Since her breast clinic consultation date, she underwent a bilateral breast MRI on 01/23/24 which demonstrated: the two separate biopsy proven malignancies in the anterior upper inner left breast and at a middle depth in the upper inner left breast, measuring 1.5 cm and 1.7 respectively (and located approximately 2.5 cm apart). No other suspicious findings were demonstrated in either breast or axilla.   She opted to proceed with a left breast lumpectomy with left axillary SLN excisions on 02/05/24 under the care of Dr. Ebbie. Pathology from the procedure revealed: tumor size of 3.5 cm; histology of grade 2 invasive lobular carcinoma with LCIS; all margins negative for invasive and in situ carcinoma; margin status to invasive disease of >10 mm from all margins; margin status to in situ disease of 0.1 mm from the anterior margin; nodal status of 1/4 left axillary SLN excisions positive for metastatic carcinoma (with a metastatic focus measuring 0.5 mm in the greatest extent; negative for extranodal extension);  ER status: 100% positive and PR status 25% positive, both with  strong staining intensity; Proliferation marker Ki67 at 10%; Her2 status negative; Grade 2.  - Of note: procedural findings note that the lesions at the ribbon clip and coil clip as well as the area between the 2 clips showed invasive lobular carcinoma with a very similar histomorphology - the two masses were subsequently conveyed as a single tumor measuring approximately 3.5 cm.    Oncotype DX was obtained on the final surgical sample and showed a recurrence score of 18 which predicts a risk of recurrence outside the breast over the next 9 years of 4%, if the patient's only systemic therapy were to be an antiestrogen for 5 years.  It also predicted no significant benefit from chemotherapy.  In most recent history, she was seen by Dr. [name] on 03/04/24 for a follow-up visit. She agreed to proceed with antiestrogen therapy consisting of anastrozole at that time which she will begin after completing radiation therapy.   Symptomatically, the patient reports: ***        ALLERGIES:  is allergic to penicillins.  Meds: Current Outpatient Medications  Medication Sig Dispense Refill   Accu-Chek Softclix Lancets lancets Use as instructed to check blood sugar once a day. E11.9 100 each 6   albuterol  (VENTOLIN  HFA) 108 (90 Base) MCG/ACT inhaler Inhale 2 puffs into the lungs every 4 (four) hours as needed for wheezing or shortness of breath. 1 each 0   amLODipine  (NORVASC ) 10 MG tablet TAKE 1 TABLET BY MOUTH EVERY DAY 90 tablet 1   atorvastatin  (LIPITOR) 80 MG tablet Take 1 tablet (80 mg total) by mouth daily. Discontinue the 40  mg Atorvastatin  90 tablet 1   Blood Glucose Monitoring Suppl (ACCU-CHEK GUIDE ME) w/Device KIT Use as directed 1 kit 0   Blood Pressure Monitoring (BLOOD PRESSURE CUFF) MISC Use to check blood pressure once daily. 1 each 0   fluticasone  (FLONASE ) 50 MCG/ACT nasal spray Place 1 spray into both nostrils daily. 16 g 6   glimepiride  (AMARYL ) 4 MG tablet Take 1 tablet (4 mg total) by  mouth 2 (two) times daily. 180 tablet 1   glucose blood (ACCU-CHEK GUIDE TEST) test strip Use as instructed to check blood sugar once a day. E11.9 100 each 6   lisinopril  (ZESTRIL ) 30 MG tablet Take 1 tablet (30 mg total) by mouth daily. 90 tablet 1   metFORMIN  (GLUCOPHAGE ) 1000 MG tablet TAKE 1 TABLET BY MOUTH 2 (TWO) TIMES DAILY WITH A MEAL. FOR DIABETES 180 tablet 1   No current facility-administered medications for this encounter.    Physical Findings:  vitals were not taken for this visit. .     General: Alert and oriented, in no acute distress HEENT: Head is normocephalic. Extraocular movements are intact. Oropharynx is clear. Neck: Neck is supple, no palpable cervical or supraclavicular lymphadenopathy. Heart: Regular in rate and rhythm with no murmurs, rubs, or gallops. Chest: Clear to auscultation bilaterally, with no rhonchi, wheezes, or rales. Abdomen: Soft, nontender, nondistended, with no rigidity or guarding. Extremities: No cyanosis or edema. Lymphatics: see Neck Exam Musculoskeletal: symmetric strength and muscle tone throughout. Neurologic: No obvious focalities. Speech is fluent.  Psychiatric: Judgment and insight are intact. Affect is appropriate. Breast exam reveals ***  Lab Findings: Lab Results  Component Value Date   WBC 6.2 01/16/2024   HGB 12.7 01/16/2024   HCT 36.6 01/16/2024   MCV 79.2 (L) 01/16/2024   PLT 221 01/16/2024    @LASTCHEMISTRY @  Radiographic Findings: No results found.  Impression/Plan: We discussed adjuvant radiotherapy today.  I recommend *** in order to ***.  I reviewed the logistics, benefits, risks, and potential side effects of this treatment in detail. Risks may include but not necessary be limited to acute and late injury tissue in the radiation fields such as skin irritation (change in color/pigmentation, itching, dryness, pain, peeling). She may experience fatigue. We also discussed possible risk of long term cosmetic changes or  scar tissue. There is also a smaller risk for lung toxicity, ***cardiac toxicity, ***brachial plexopathy, ***lymphedema, ***musculoskeletal changes, ***rib fragility or ***induction of a second malignancy, ***late chronic non-healing soft tissue wound.    The patient asked good questions which I answered to her satisfaction. She is enthusiastic about proceeding with treatment. A consent form has been *** signed and placed in her chart.  A total of *** medically necessary complex treatment devices will be fabricated and supervised by me: *** fields with MLCs for custom blocks to protect heart, and lungs;  and, a Vac-lok. MORE COMPLEX DEVICES MAY BE MADE IN DOSIMETRY FOR FIELD IN FIELD BEAMS FOR DOSE HOMOGENEITY.  I have requested : 3D Simulation which is medically necessary to give adequate dose to at risk tissues while sparing lungs and heart.  I have requested a DVH of the following structures: lungs, heart, *** lumpectomy cavity.    The patient will receive *** Gy in *** fractions to the *** with *** fields.  This will be *** followed by a boost.  On date of service, in total, I spent *** minutes on this encounter. Patient was seen in person.  _____________________________________   Lauraine Golden, MD  This document serves as a record of services personally performed by Lauraine Golden, MD. It was created on her behalf by Dorthy Fuse, a trained medical scribe. The creation of this record is based on the scribe's personal observations and the provider's statements to them. This document has been checked and approved by the attending provider.

## 2024-03-07 ENCOUNTER — Ambulatory Visit
Admission: RE | Admit: 2024-03-07 | Discharge: 2024-03-07 | Disposition: A | Discharge status: Home / Self Care | Source: Ambulatory Visit | Attending: Radiation Oncology | Admitting: Radiation Oncology

## 2024-03-07 ENCOUNTER — Inpatient Hospital Stay: Admitting: Licensed Clinical Social Worker

## 2024-03-07 ENCOUNTER — Other Ambulatory Visit: Payer: Self-pay

## 2024-03-07 ENCOUNTER — Ambulatory Visit

## 2024-03-07 DIAGNOSIS — C50212 Malignant neoplasm of upper-inner quadrant of left female breast: Secondary | ICD-10-CM

## 2024-03-07 NOTE — Progress Notes (Signed)
 [facility] Clinical Social Work  Clinical Social Work was referred by medical provider for transportation issues.  Clinical Social Worker contacted patient by phone to offer support and assess for needs.    Patient has difficulty getting to her appointments and had to switch to a phoen call today with Dr. Izell. She has radiation starting with CT sim on Monday.  Pt's son is going to bring her to that appt, but will not be available to help daily.  Interventions: Provided patient with information about transportation benefit through both of her insurances and how to utilize them  [facility] Medicare- up to 36 one-way rides per year. Call Safe ride: [phone] Medicaid- transportation benefit for medical appts- call DSS [phone]  Explained role of CSW and inquired re: other needs. Pt is tired at the moment and denied needs and declined to speak further at this time.    Follow Up Plan:  Patient will contact CSW with any support or resource needs. Patient will utilize transportation through insurance    Tyrees Chopin E HELANE, LCSW  Clinical Social Worker [facility]

## 2024-03-10 ENCOUNTER — Ambulatory Visit: Admitting: Physical Therapy

## 2024-03-10 ENCOUNTER — Ambulatory Visit
Admission: RE | Admit: 2024-03-10 | Discharge: 2024-03-10 | Disposition: A | Discharge status: Home / Self Care | Source: Ambulatory Visit | Attending: Radiation Oncology | Admitting: Radiation Oncology

## 2024-03-10 ENCOUNTER — Telehealth: Payer: Self-pay | Admitting: Physical Therapy

## 2024-03-10 ENCOUNTER — Encounter: Payer: Self-pay | Admitting: *Deleted

## 2024-03-10 DIAGNOSIS — C773 Secondary and unspecified malignant neoplasm of axilla and upper limb lymph nodes: Secondary | ICD-10-CM | POA: Diagnosis present

## 2024-03-10 DIAGNOSIS — Z510 Encounter for antineoplastic radiation therapy: Secondary | ICD-10-CM | POA: Insufficient documentation

## 2024-03-10 DIAGNOSIS — C50212 Malignant neoplasm of upper-inner quadrant of left female breast: Secondary | ICD-10-CM | POA: Diagnosis present

## 2024-03-10 NOTE — Telephone Encounter (Signed)
 Pt was a no show to her appointment at 10 am. Called pt and she reports she had radiation simulation this morning and she did not have transportation to this appointment. She will call to cancel on Wednesday if she is unable to get transportation for that appointment.  Florina Sever [place], [place] 03/10/24 10:59 AM

## 2024-03-11 ENCOUNTER — Telehealth: Payer: Self-pay

## 2024-03-11 ENCOUNTER — Ambulatory Visit: Attending: Internal Medicine

## 2024-03-11 DIAGNOSIS — Z23 Encounter for immunization: Secondary | ICD-10-CM

## 2024-03-11 NOTE — Progress Notes (Signed)
 Flu ( high dose) vaccine administered per protocols.  Information sheet given. Patient denies and pain or discomfort at injection site. Tolerated injection well no reaction.

## 2024-03-11 NOTE — Telephone Encounter (Signed)
 I received a [employer]- 3051,request for PCS.  I called the patient to discuss her need for PCS. I explained to her that since it has been more than 90 days since she was seen by Dr Vicci, she would need to have an appointment with Dr Vicci before the form can be completed. She said she has an appointment with Dr Vicci on 04/01/2024 and will speak to her about it at that time.  We spoke about the assistance she needs and she stated that she would like someone to help around the house and to do errands.  I explained that the focus of PCS is assisting with personal care, bathing, grooming, ambulation and the patient said she doesn't need that.  She said she will have to consider other options because she can do the personal care herself.

## 2024-03-12 ENCOUNTER — Ambulatory Visit: Admitting: Physical Therapy

## 2024-03-12 ENCOUNTER — Encounter: Payer: Self-pay | Admitting: Physical Therapy

## 2024-03-12 DIAGNOSIS — M25612 Stiffness of left shoulder, not elsewhere classified: Secondary | ICD-10-CM | POA: Diagnosis not present

## 2024-03-12 DIAGNOSIS — C50212 Malignant neoplasm of upper-inner quadrant of left female breast: Secondary | ICD-10-CM

## 2024-03-12 DIAGNOSIS — Z483 Aftercare following surgery for neoplasm: Secondary | ICD-10-CM

## 2024-03-12 DIAGNOSIS — R293 Abnormal posture: Secondary | ICD-10-CM

## 2024-03-12 DIAGNOSIS — R600 Localized edema: Secondary | ICD-10-CM

## 2024-03-12 NOTE — Therapy (Signed)
 OUTPATIENT PHYSICAL THERAPY BREAST CANCER POST OP FOLLOW UP   Patient Name: Stephanie Neal MRN: [id number] DOB:10-Nov-1955, 68 y.o., female Today's Date: 03/12/2024  END OF SESSION:  PT End of Session - 03/12/24 1052     Visit Number 3    Number of Visits 10    Date for Recertification  04/02/24    PT Start Time 1003    PT Stop Time 1056    PT Time Calculation (min) 53 min    Activity Tolerance Patient tolerated treatment well    Behavior During Therapy WFL for tasks assessed/performed           Past Medical History:  Diagnosis Date   Allergy    Arthritis    Breast cancer (HCC) 01/08/2024   Diabetes mellitus    Dysphonia    polypoid corditis   Hyperlipidemia    Hypertension    Past Surgical History:  Procedure Laterality Date   BREAST BIOPSY Left 01/08/2024   US  LT BREAST BX W LOC DEV 1ST LESION IMG BX SPEC US  GUIDE 01/08/2024 [facility]   BREAST BIOPSY Left 01/08/2024   US  LT BREAST BX W LOC DEV EA ADD LESION IMG BX SPEC US  GUIDE 01/08/2024 [facility]   BREAST BIOPSY  02/01/2024   MM LT RADIOACTIVE SEED LOC MAMMO GUIDE 02/01/2024 [facility]   BREAST BIOPSY  02/01/2024   MM LT RADIOACTIVE SEED EA ADD LESION LOC MAMMO GUIDE 02/01/2024 [facility]   BREAST LUMPECTOMY WITH RADIOACTIVE SEED AND SENTINEL LYMPH NODE BIOPSY Left 02/05/2024   Procedure: BREAST LUMPECTOMY WITH RADIOACTIVE SEED AND SENTINEL LYMPH NODE BIOPSY;  Surgeon: Ebbie Cough, MD;  Location: [facility] OR;  Service: General;  Laterality: Left;  GEN w/PEC BLOCK LEFT BREAST RADIOACTIVE SEED LOCALIZED LUMPECTOMY x2 LEFT AXILLARY SENTINEL NODE BIOPSY   COLONOSCOPY     x 2 - 2015, 2020   FOOT SURGERY     MICROLARYNGOSCOPY N/A 01/18/2022   Procedure: MICROLARYNGOSCOPY WITH EXCISION OF POLYPS;  Surgeon: Jesus Oliphant, MD;  [facility];  Service: ENT;  Laterality: N/A;   THROAT SURGERY     Patient Active Problem List   Diagnosis Date Noted   Genetic testing 01/29/2024    Aortic atherosclerosis 05/05/2021   Tetanus, diphtheria, and acellular pertussis (Tdap) vaccination declined 05/10/2020   Hyperlipidemia associated with type 2 diabetes mellitus (HCC) 11/18/2019   Chronic pain of right knee 11/18/2019   Mixed hyperlipidemia 06/25/2018   Over weight 06/25/2018   Stress due to family tension 11/13/2017   DM type 2 (diabetes mellitus, type 2) (HCC) 03/05/2014   HYPERCHOLESTEROLEMIA 03/12/2007   OBESITY 03/12/2007   Tobacco dependence 03/12/2007   HTN (hypertension) 03/12/2007   ALLERGIC RHINITIS, SEASONAL 03/12/2007    PCP: Barnie Louder, MD  REFERRING PROVIDER: Dr. Cough Ebbie   REFERRING DIAG: Left breast cancer    THERAPY DIAG:  Stiffness of left shoulder, not elsewhere classified  Aftercare following surgery for neoplasm  Localized edema  Abnormal posture  Malignant neoplasm of upper-inner quadrant of left breast in female, estrogen receptor positive (HCC)  Rationale for Evaluation and Treatment: Rehabilitation  ONSET DATE: 11/16/2023  SUBJECTIVE:  SUBJECTIVE STATEMENT: I have been wearing the foam and those lines on my breast went away.   PERTINENT HISTORY:  Patient was diagnosed on 11/16/2023 with left grade 2 invasive lobular carcinoma breast cancer. It measures 1.3 cm and 9 mm and is located in the upper inner quadrant. It is ER/PR positive and HER2 negative with a Ki67 of 10%.   PATIENT GOALS:  Reassess how my recovery is going related to arm function, pain, and swelling.  PAIN:  Are you having pain? No  PRECAUTIONS: Recent Surgery, left UE Lymphedema risk,   RED FLAGS: None   ACTIVITY LEVEL / LEISURE: has been doing post op exercises, has been walking at least 2 miles   OBJECTIVE:   PATIENT SURVEYS:  QUICK  DASH:      OBSERVATIONS: Cording visible in L breast and axilla, seroma/scar tissue palpable in L axilla and in L breast just medial to aerola  POSTURE:  Forward head and rounded shoulders posture   UPPER EXTREMITY AROM/PROM:   A/PROM RIGHT   eval    Shoulder extension 52  Shoulder flexion 151  Shoulder abduction 170  Shoulder internal rotation 70  Shoulder external rotation 82                          (Blank rows = not tested)   A/PROM LEFT   eval LEFT 03/05/24  Shoulder extension 48 70  Shoulder flexion 151 165  Shoulder abduction 172 165  Shoulder internal rotation 75 52  Shoulder external rotation 73 88                          (Blank rows = not tested)   CERVICAL AROM: All within normal limits   UPPER EXTREMITY STRENGTH: WNL   LYMPHEDEMA ASSESSMENTS (in cm):    LANDMARK RIGHT   eval  10 cm proximal to olecranon process 28.3  Olecranon process 25.4  10 cm proximal to ulnar styloid process 20.9  Just proximal to ulnar styloid process 15.9  Across hand at thumb web space 19.8  At base of 2nd digit 6.1  (Blank rows = not tested)   LANDMARK LEFT   eval LEFT 03/05/24  10 cm proximal to olecranon process 28.7 27.2  Olecranon process 25.2 25  10  cm proximal to ulnar styloid process 19.9 19.3  Just proximal to ulnar styloid process 15.9 16.5  Across hand at thumb web space 19.8 20.3  At base of 2nd digit 6.2 6  (Blank rows = not tested)    Surgery type/Date: 02/05/24 L breast lumpectomy and SLNB Number of lymph nodes removed: 1/4 Current/past treatment (chemo, radiation, hormone therapy): radiation and hormone therapy Other symptoms:  Heaviness/tightness Yes Pain No Pitting edema No Infections No Decreased scar mobility Yes Stemmer sign No  TREATMENT PERFORMED: 03/12/24: Therapeutic Exercise: Pulleys x 2 min in direction of flexion and 2 min in direction of abduction with pt returning therapist demo, v/c for form Ball up wall x 10 reps in  direction of flexion and 10 reps in direction of L shoulder abduction In supine: overhead flexion with dowel with 5 sec hold x 10 reps and L shoulder abduction with v/c to keep elbow straight x 10 reps with pt returning therapist demo for each Manual Therapy: PROM to L shoulder in direction of flexion and abduction Gentle STM to area of scar tissue in L axilla - unable to palpable cording in L breast  in supine In sitting: cording in lateral breast palpable in this position so performed MFR to cording in L breast in seated postion with very thick cord palpable and visible  PATIENT EDUCATION:  Education details: axillary cording, need for stretching, info to obtain pulleys, seroma education, using chip pack and foam in bra for additional compression, scar massage, ABC class Person educated: Patient Education method: Explanation and Handouts Education comprehension: verbalized understanding  HOME EXERCISE PROGRAM: Reviewed previously given post op HEP. Pulleys x 2 min in direction of flexion and abduction  ASSESSMENT:  CLINICAL IMPRESSION: Began AAROM exercises today and pt felt a good stretch in her L axilla with these. Her cording was not palpable in supine position much was palpable and visible when she was in a seated position. Began MFR to cording at left lateral breast in seated position with very thick cord palpable. Educated pt to move her foam to this area to see if it helps.   Pt will benefit from skilled therapeutic intervention to improve on the following deficits: Decreased knowledge of precautions, impaired UE functional use, pain, decreased ROM, postural dysfunction.   PT treatment/interventions: ADL/Self care home management, [phone]- PT Re-evaluation, 97110-Therapeutic exercises, 97530- Therapeutic activity, [id number]- Neuromuscular re-education, 97535- Self Care, [id number]- Manual therapy, [phone]- Orthotic Initial, [phone]- Orthotic/Prosthetic subsequent, and Patient/Family  education   GOALS: Goals reviewed with patient? Yes  GOALS MET AT EVAL:  GOALS Name Target Date Goal status  1 Pt will be able to verbalize understanding of pertinent lymphedema risk reduction practices relevant to her dx specifically related to skin care.  Baseline:  No knowledge Eval Achieved at eval  2 Pt will be able to return demo and/or verbalize understanding of the post op HEP related to regaining shoulder ROM. Baseline:  No knowledge Eval Achieved at eval  3 Pt will be able to verbalize understanding of the importance of viewing the post op After Breast CA Class video for further lymphedema risk reduction education and therapeutic exercise.  Baseline:  No knowledge Eval Achieved at eval   LONG TERM GOALS:  (STG=LTG)  GOALS Name Target Date  Goal status  1 Pt will demonstrate she has regained full shoulder ROM and function post operatively compared to baselines.  Baseline: 04/02/24 INITIAL  2 Pt will demonstrate 170 degrees of L shoulder abduction to allow her to reach out to the side without tightness. 04/02/24 INITIAL  3 Pt will demonstrate a 50% improvement in axillary and breast cording to decrease tightness and tenderness. 04/02/24 INITIAL  4 Pt will be independent in self MLD for management of breast swelling to decrease risk of infection. 04/02/24 INITIAL     PLAN:  PT FREQUENCY/DURATION: 2x/wk for 4 wks  PLAN FOR NEXT SESSION: pulleys, ball, MFR to cording in L breast (in sitting) and axilla, MLD to L breast for post op swelling and seroma   [facility]  [address], [address]  [place] [address]  [phone]  After Breast Cancer Class Video It is recommended you view the ABC class video to be educated on lymphedema risk reduction. This video lasts for about 30 minutes. It can be viewed on our website here: [email or web address]  Scar massage You can begin gentle scar  massage to you incision sites. Gently place one hand on the incision and move the skin (without sliding on the skin) in various directions. Do this for a few minutes and then you can gently massage either coconut oil or vitamin E cream into  the scars.  Compression garment You should continue wearing your compression bra until you feel like you no longer have swelling.  Home exercise Program Continue doing the exercises you were given until you feel like you can do them without feeling any tightness at the end.   Walking Program Studies show that 30 minutes of walking per day (fast enough to elevate your heart rate) can significantly reduce the risk of a cancer recurrence. If you can't walk due to other medical reasons, we encourage you to find another activity you could do (like a stationary bike or water exercise).  Posture After breast cancer surgery, people frequently sit with rounded shoulders posture because it puts their incisions on slack and feels better. If you sit like this and scar tissue forms in that position, you can become very tight and have pain sitting or standing with good posture. Try to be aware of your posture and sit and stand up tall to heal properly.  Follow up PT: It is recommended you return every 3 months for the first 3 years following surgery to be assessed on the SOZO machine for an L-Dex score. This helps prevent clinically significant lymphedema in 95% of patients. These follow up screens are 10 minute appointments that you are not billed for.  [employer], PT 03/12/2024, 11:01 AM

## 2024-03-14 DIAGNOSIS — Z510 Encounter for antineoplastic radiation therapy: Secondary | ICD-10-CM | POA: Diagnosis not present

## 2024-03-17 ENCOUNTER — Ambulatory Visit

## 2024-03-17 ENCOUNTER — Ambulatory Visit
Admission: RE | Admit: 2024-03-17 | Discharge: 2024-03-17 | Disposition: A | Discharge status: Home / Self Care | Source: Ambulatory Visit | Attending: Radiation Oncology | Admitting: Radiation Oncology

## 2024-03-17 ENCOUNTER — Other Ambulatory Visit: Payer: Self-pay

## 2024-03-17 DIAGNOSIS — R600 Localized edema: Secondary | ICD-10-CM

## 2024-03-17 DIAGNOSIS — R293 Abnormal posture: Secondary | ICD-10-CM

## 2024-03-17 DIAGNOSIS — M25612 Stiffness of left shoulder, not elsewhere classified: Secondary | ICD-10-CM

## 2024-03-17 DIAGNOSIS — C50212 Malignant neoplasm of upper-inner quadrant of left female breast: Secondary | ICD-10-CM

## 2024-03-17 DIAGNOSIS — Z483 Aftercare following surgery for neoplasm: Secondary | ICD-10-CM

## 2024-03-17 DIAGNOSIS — Z510 Encounter for antineoplastic radiation therapy: Secondary | ICD-10-CM | POA: Diagnosis not present

## 2024-03-17 LAB — RAD ONC ARIA SESSION SUMMARY
Course Elapsed Days: 0
Plan Fractions Treated to Date: 1
Plan Prescribed Dose Per Fraction: 2.67 Gy
Plan Total Fractions Prescribed: 15
Plan Total Prescribed Dose: 40.05 Gy
Reference Point Dosage Given to Date: 2.67 Gy
Reference Point Session Dosage Given: 2.67 Gy
Session Number: 1

## 2024-03-17 NOTE — Therapy (Signed)
 OUTPATIENT PHYSICAL THERAPY BREAST CANCER TREATMENT   Patient Name: Stephanie Neal MRN: [id number] DOB:[birth year], 68 y.o., female Today's Date: 03/17/2024  END OF SESSION:  PT End of Session - 03/17/24 1007     Visit Number 4    Number of Visits 10    Date for Recertification  04/02/24    PT Start Time 1003    PT Stop Time 1058    PT Time Calculation (min) 55 min    Activity Tolerance Patient tolerated treatment well    Behavior During Therapy WFL for tasks assessed/performed           Past Medical History:  Diagnosis Date   Allergy    Arthritis    Breast cancer (HCC) 01/08/2024   Diabetes mellitus    Dysphonia    polypoid corditis   Hyperlipidemia    Hypertension    Past Surgical History:  Procedure Laterality Date   BREAST BIOPSY Left 01/08/2024   US  LT BREAST BX W LOC DEV 1ST LESION IMG BX SPEC US  GUIDE 01/08/2024 [facility]   BREAST BIOPSY Left 01/08/2024   US  LT BREAST BX W LOC DEV EA ADD LESION IMG BX SPEC US  GUIDE 01/08/2024 [facility]   BREAST BIOPSY  02/01/2024   MM LT RADIOACTIVE SEED LOC MAMMO GUIDE 02/01/2024 [facility]   BREAST BIOPSY  02/01/2024   MM LT RADIOACTIVE SEED EA ADD LESION LOC MAMMO GUIDE 02/01/2024 [facility]   BREAST LUMPECTOMY WITH RADIOACTIVE SEED AND SENTINEL LYMPH NODE BIOPSY Left 02/05/2024   Procedure: BREAST LUMPECTOMY WITH RADIOACTIVE SEED AND SENTINEL LYMPH NODE BIOPSY;  Surgeon: Ebbie Cough, MD;  Location: [facility] OR;  Service: General;  Laterality: Left;  GEN w/PEC BLOCK LEFT BREAST RADIOACTIVE SEED LOCALIZED LUMPECTOMY x2 LEFT AXILLARY SENTINEL NODE BIOPSY   COLONOSCOPY     x 2 - 2015, 2020   FOOT SURGERY     MICROLARYNGOSCOPY N/A 01/18/2022   Procedure: MICROLARYNGOSCOPY WITH EXCISION OF POLYPS;  Surgeon: Jesus Oliphant, MD;  Location: [facility] OR;  Service: ENT;  Laterality: N/A;   THROAT SURGERY     Patient Active Problem List   Diagnosis Date Noted   Genetic testing 01/29/2024   Aortic  atherosclerosis 05/05/2021   Tetanus, diphtheria, and acellular pertussis (Tdap) vaccination declined 05/10/2020   Hyperlipidemia associated with type 2 diabetes mellitus (HCC) 11/18/2019   Chronic pain of right knee 11/18/2019   Mixed hyperlipidemia 06/25/2018   Over weight 06/25/2018   Stress due to family tension 11/13/2017   DM type 2 (diabetes mellitus, type 2) (HCC) 03/05/2014   HYPERCHOLESTEROLEMIA 03/12/2007   OBESITY 03/12/2007   Tobacco dependence 03/12/2007   HTN (hypertension) 03/12/2007   ALLERGIC RHINITIS, SEASONAL 03/12/2007    PCP: Barnie Louder, MD  REFERRING PROVIDER: Dr. Cough Ebbie   REFERRING DIAG: Left breast cancer    THERAPY DIAG:  Stiffness of left shoulder, not elsewhere classified  Aftercare following surgery for neoplasm  Localized edema  Abnormal posture  Malignant neoplasm of upper-inner quadrant of left breast in female, estrogen receptor positive (HCC)  Rationale for Evaluation and Treatment: Rehabilitation  ONSET DATE: 11/16/2023  SUBJECTIVE:  SUBJECTIVE STATEMENT: I'm feeling okay today. The cord is loosening up.   PERTINENT HISTORY:  Patient was diagnosed on 11/16/2023 with left grade 2 invasive lobular carcinoma breast cancer. It measures 1.3 cm and 9 mm and is located in the upper inner quadrant. It is ER/PR positive and HER2 negative with a Ki67 of 10%.   PATIENT GOALS:  Reassess how my recovery is going related to arm function, pain, and swelling.  PAIN:  Are you having pain? No  PRECAUTIONS: Recent Surgery, left UE Lymphedema risk,   RED FLAGS: None   ACTIVITY LEVEL / LEISURE: has been doing post op exercises, has been walking at least 2 miles   OBJECTIVE:   PATIENT SURVEYS:  QUICK DASH:      OBSERVATIONS: Cording visible  in L breast and axilla, seroma/scar tissue palpable in L axilla and in L breast just medial to aerola  POSTURE:  Forward head and rounded shoulders posture   UPPER EXTREMITY AROM/PROM:   A/PROM RIGHT   eval    Shoulder extension 52  Shoulder flexion 151  Shoulder abduction 170  Shoulder internal rotation 70  Shoulder external rotation 82                          (Blank rows = not tested)   A/PROM LEFT   eval LEFT 03/05/24  Shoulder extension 48 70  Shoulder flexion 151 165  Shoulder abduction 172 165  Shoulder internal rotation 75 52  Shoulder external rotation 73 88                          (Blank rows = not tested)   CERVICAL AROM: All within normal limits   UPPER EXTREMITY STRENGTH: WNL   LYMPHEDEMA ASSESSMENTS (in cm):    LANDMARK RIGHT   eval  10 cm proximal to olecranon process 28.3  Olecranon process 25.4  10 cm proximal to ulnar styloid process 20.9  Just proximal to ulnar styloid process 15.9  Across hand at thumb web space 19.8  At base of 2nd digit 6.1  (Blank rows = not tested)   LANDMARK LEFT   eval LEFT 03/05/24  10 cm proximal to olecranon process 28.7 27.2  Olecranon process 25.2 25  10  cm proximal to ulnar styloid process 19.9 19.3  Just proximal to ulnar styloid process 15.9 16.5  Across hand at thumb web space 19.8 20.3  At base of 2nd digit 6.2 6  (Blank rows = not tested)    Surgery type/Date: 02/05/24 L breast lumpectomy and SLNB Number of lymph nodes removed: 1/4 Current/past treatment (chemo, radiation, hormone therapy): radiation and hormone therapy Other symptoms:  Heaviness/tightness Yes Pain No Pitting edema No Infections No Decreased scar mobility Yes Stemmer sign No  TREATMENT PERFORMED: 03/17/24: Therapeutic Exercises Pulleys x 2 min in direction of flexion and 2 min in direction of abduction with pt returning therapist demo, v/c for form Ball up wall x 10 reps in direction of flexion and 10 reps in direction of L  shoulder abduction, reviewed correct technique Modified downward dog on wall x 10, 5 sec holds returning therapist demo Therapeutic Activities  Supine over half foam roll for following: Bil UE horz abd x 10 reps, bil UE scaption in a V x 10 reps, and then bil UE abduction in a snow angel x 10, 5 sec holds' pt returned demo and reports these stretches felt very good; these beneficial  for improved shoulder ROM, improved posture and to focus on muscle relaxation during to decrease guarding  Manual Therapy PROM to Lt shoulder in direction of flexion, abduction, and D2 with scapular depression by therapist throughout and multiple VC's during to relax as pt struggled with this due to protective posturing. She was able to relax with cuing MFR to Lt axillary at area of multiple cords  03/12/24: Therapeutic Exercise: Pulleys x 2 min in direction of flexion and 2 min in direction of abduction with pt returning therapist demo, v/c for form Ball up wall x 10 reps in direction of flexion and 10 reps in direction of L shoulder abduction In supine: overhead flexion with dowel with 5 sec hold x 10 reps and L shoulder abduction with v/c to keep elbow straight x 10 reps with pt returning therapist demo for each Manual Therapy: PROM to L shoulder in direction of flexion and abduction Gentle STM to area of scar tissue in L axilla - unable to palpable cording in L breast in supine In sitting: cording in lateral breast palpable in this position so performed MFR to cording in L breast in seated postion with very thick cord palpable and visible  PATIENT EDUCATION:  Education details: axillary cording, need for stretching, info to obtain pulleys, seroma education, using chip pack and foam in bra for additional compression, scar massage, ABC class Person educated: Patient Education method: Explanation and Handouts Education comprehension: verbalized understanding  HOME EXERCISE PROGRAM: Reviewed previously given  post op HEP. Pulleys x 2 min in direction of flexion and abduction  ASSESSMENT:  CLINICAL IMPRESSION: Continued with AA/ROM stretches and progressed pt to include A/ROM stretches over half foam roll as well. She required cuing during for correct UE positioning  but reports feeling a very good stretch with these overall in areas of cording.   Pt will benefit from skilled therapeutic intervention to improve on the following deficits: Decreased knowledge of precautions, impaired UE functional use, pain, decreased ROM, postural dysfunction.   PT treatment/interventions: ADL/Self care home management, [phone]- PT Re-evaluation, 97110-Therapeutic exercises, 97530- Therapeutic activity, [id number]- Neuromuscular re-education, 97535- Self Care, [id number]- Manual therapy, [phone]- Orthotic Initial, [phone]- Orthotic/Prosthetic subsequent, and Patient/Family education   GOALS: Goals reviewed with patient? Yes  GOALS MET AT EVAL:  GOALS Name Target Date Goal status  1 Pt will be able to verbalize understanding of pertinent lymphedema risk reduction practices relevant to her dx specifically related to skin care.  Baseline:  No knowledge Eval Achieved at eval  2 Pt will be able to return demo and/or verbalize understanding of the post op HEP related to regaining shoulder ROM. Baseline:  No knowledge Eval Achieved at eval  3 Pt will be able to verbalize understanding of the importance of viewing the post op After Breast CA Class video for further lymphedema risk reduction education and therapeutic exercise.  Baseline:  No knowledge Eval Achieved at eval   LONG TERM GOALS:  (STG=LTG)  GOALS Name Target Date  Goal status  1 Pt will demonstrate she has regained full shoulder ROM and function post operatively compared to baselines.  Baseline: 04/02/24 INITIAL  2 Pt will demonstrate 170 degrees of L shoulder abduction to allow her to reach out to the side without tightness. 04/02/24 INITIAL  3 Pt will demonstrate a 50%  improvement in axillary and breast cording to decrease tightness and tenderness. 04/02/24 INITIAL  4 Pt will be independent in self MLD for management of breast swelling to decrease risk of  infection. 04/02/24 INITIAL     PLAN:  PT FREQUENCY/DURATION: 2x/wk for 4 wks  PLAN FOR NEXT SESSION: Cont pulleys, ball, and stretches over half foam roll; also cont MFR to cording in Lt breast (in sitting) and axilla (in supine), MLD to L breast for post op swelling and seroma   [facility]  [address], [address]  [place] [address]  [phone]    Aden Berwyn Caldron, PTA 03/17/2024, 11:21 AM

## 2024-03-18 ENCOUNTER — Ambulatory Visit: Admission: RE | Admit: 2024-03-18 | Discharge: 2024-03-18 | Attending: Radiation Oncology

## 2024-03-18 ENCOUNTER — Other Ambulatory Visit: Payer: Self-pay

## 2024-03-18 ENCOUNTER — Ambulatory Visit
Admission: RE | Admit: 2024-03-18 | Discharge: 2024-03-18 | Disposition: A | Source: Ambulatory Visit | Attending: Radiation Oncology

## 2024-03-18 DIAGNOSIS — Z510 Encounter for antineoplastic radiation therapy: Secondary | ICD-10-CM | POA: Diagnosis not present

## 2024-03-18 LAB — RAD ONC ARIA SESSION SUMMARY
Course Elapsed Days: 1
Plan Fractions Treated to Date: 2
Plan Prescribed Dose Per Fraction: 2.67 Gy
Plan Total Fractions Prescribed: 15
Plan Total Prescribed Dose: 40.05 Gy
Reference Point Dosage Given to Date: 5.34 Gy
Reference Point Session Dosage Given: 2.67 Gy
Session Number: 2

## 2024-03-19 ENCOUNTER — Ambulatory Visit
Admission: RE | Admit: 2024-03-19 | Discharge: 2024-03-19 | Disposition: A | Discharge status: Home / Self Care | Source: Ambulatory Visit | Attending: Radiation Oncology | Admitting: Radiation Oncology

## 2024-03-19 ENCOUNTER — Encounter: Payer: Self-pay | Admitting: Physical Therapy

## 2024-03-19 ENCOUNTER — Ambulatory Visit: Admitting: Physical Therapy

## 2024-03-19 ENCOUNTER — Other Ambulatory Visit: Payer: Self-pay

## 2024-03-19 DIAGNOSIS — M25612 Stiffness of left shoulder, not elsewhere classified: Secondary | ICD-10-CM

## 2024-03-19 DIAGNOSIS — R600 Localized edema: Secondary | ICD-10-CM

## 2024-03-19 DIAGNOSIS — C50212 Malignant neoplasm of upper-inner quadrant of left female breast: Secondary | ICD-10-CM

## 2024-03-19 DIAGNOSIS — Z510 Encounter for antineoplastic radiation therapy: Secondary | ICD-10-CM | POA: Diagnosis not present

## 2024-03-19 DIAGNOSIS — Z483 Aftercare following surgery for neoplasm: Secondary | ICD-10-CM

## 2024-03-19 DIAGNOSIS — R293 Abnormal posture: Secondary | ICD-10-CM

## 2024-03-19 LAB — RAD ONC ARIA SESSION SUMMARY
Course Elapsed Days: 2
Plan Fractions Treated to Date: 3
Plan Prescribed Dose Per Fraction: 2.67 Gy
Plan Total Fractions Prescribed: 15
Plan Total Prescribed Dose: 40.05 Gy
Reference Point Dosage Given to Date: 8.01 Gy
Reference Point Session Dosage Given: 2.67 Gy
Session Number: 3

## 2024-03-19 NOTE — Therapy (Signed)
 OUTPATIENT PHYSICAL THERAPY BREAST CANCER TREATMENT   Patient Name: Stephanie Neal MRN: [id number] DOB:[birth year], 68 y.o., female Today's Date: 03/19/2024  END OF SESSION:  PT End of Session - 03/19/24 1022     Visit Number 5    Number of Visits 10    Date for Recertification  04/02/24    PT Start Time 1002    PT Stop Time 1054    PT Time Calculation (min) 52 min    Activity Tolerance Patient tolerated treatment well    Behavior During Therapy WFL for tasks assessed/performed           Past Medical History:  Diagnosis Date   Allergy    Arthritis    Breast cancer (HCC) 01/08/2024   Diabetes mellitus    Dysphonia    polypoid corditis   Hyperlipidemia    Hypertension    Past Surgical History:  Procedure Laterality Date   BREAST BIOPSY Left 01/08/2024   US  LT BREAST BX W LOC DEV 1ST LESION IMG BX SPEC US  GUIDE 01/08/2024 [facility]   BREAST BIOPSY Left 01/08/2024   US  LT BREAST BX W LOC DEV EA ADD LESION IMG BX SPEC US  GUIDE 01/08/2024 [facility]   BREAST BIOPSY  02/01/2024   MM LT RADIOACTIVE SEED LOC MAMMO GUIDE 02/01/2024 [facility]   BREAST BIOPSY  02/01/2024   MM LT RADIOACTIVE SEED EA ADD LESION LOC MAMMO GUIDE 02/01/2024 [facility]   BREAST LUMPECTOMY WITH RADIOACTIVE SEED AND SENTINEL LYMPH NODE BIOPSY Left 02/05/2024   Procedure: BREAST LUMPECTOMY WITH RADIOACTIVE SEED AND SENTINEL LYMPH NODE BIOPSY;  Surgeon: Ebbie Cough, MD;  Location: [facility] OR;  Service: General;  Laterality: Left;  GEN w/PEC BLOCK LEFT BREAST RADIOACTIVE SEED LOCALIZED LUMPECTOMY x2 LEFT AXILLARY SENTINEL NODE BIOPSY   COLONOSCOPY     x 2 - 2015, 2020   FOOT SURGERY     MICROLARYNGOSCOPY N/A 01/18/2022   Procedure: MICROLARYNGOSCOPY WITH EXCISION OF POLYPS;  Surgeon: Jesus Oliphant, MD;  Location: [facility] OR;  Service: ENT;  Laterality: N/A;   THROAT SURGERY     Patient Active Problem List   Diagnosis Date Noted   Genetic testing 01/29/2024   Aortic  atherosclerosis 05/05/2021   Tetanus, diphtheria, and acellular pertussis (Tdap) vaccination declined 05/10/2020   Hyperlipidemia associated with type 2 diabetes mellitus (HCC) 11/18/2019   Chronic pain of right knee 11/18/2019   Mixed hyperlipidemia 06/25/2018   Over weight 06/25/2018   Stress due to family tension 11/13/2017   DM type 2 (diabetes mellitus, type 2) (HCC) 03/05/2014   HYPERCHOLESTEROLEMIA 03/12/2007   OBESITY 03/12/2007   Tobacco dependence 03/12/2007   HTN (hypertension) 03/12/2007   ALLERGIC RHINITIS, SEASONAL 03/12/2007    PCP: Barnie Louder, MD  REFERRING PROVIDER: Dr. Cough Ebbie   REFERRING DIAG: Left breast cancer    THERAPY DIAG:  Stiffness of left shoulder, not elsewhere classified  Aftercare following surgery for neoplasm  Localized edema  Abnormal posture  Malignant neoplasm of upper-inner quadrant of left breast in female, estrogen receptor positive (HCC)  Rationale for Evaluation and Treatment: Rehabilitation  ONSET DATE: 11/16/2023  SUBJECTIVE:  SUBJECTIVE STATEMENT: The cord is feeling better but I have soreness in the top of my shoulder.   PERTINENT HISTORY:  Patient was diagnosed on 11/16/2023 with left grade 2 invasive lobular carcinoma breast cancer. It measures 1.3 cm and 9 mm and is located in the upper inner quadrant. It is ER/PR positive and HER2 negative with a Ki67 of 10%.   PATIENT GOALS:  Reassess how my recovery is going related to arm function, pain, and swelling.  PAIN:  Are you having pain? No  PRECAUTIONS: Recent Surgery, left UE Lymphedema risk,   RED FLAGS: None   ACTIVITY LEVEL / LEISURE: has been doing post op exercises, has been walking at least 2 miles   OBJECTIVE:   PATIENT SURVEYS:  QUICK  DASH:      OBSERVATIONS: Cording visible in L breast and axilla, seroma/scar tissue palpable in L axilla and in L breast just medial to aerola  POSTURE:  Forward head and rounded shoulders posture   UPPER EXTREMITY AROM/PROM:   A/PROM RIGHT   eval    Shoulder extension 52  Shoulder flexion 151  Shoulder abduction 170  Shoulder internal rotation 70  Shoulder external rotation 82                          (Blank rows = not tested)   A/PROM LEFT   eval LEFT 03/05/24  Shoulder extension 48 70  Shoulder flexion 151 165  Shoulder abduction 172 165  Shoulder internal rotation 75 52  Shoulder external rotation 73 88                          (Blank rows = not tested)   CERVICAL AROM: All within normal limits   UPPER EXTREMITY STRENGTH: WNL   LYMPHEDEMA ASSESSMENTS (in cm):    LANDMARK RIGHT   eval  10 cm proximal to olecranon process 28.3  Olecranon process 25.4  10 cm proximal to ulnar styloid process 20.9  Just proximal to ulnar styloid process 15.9  Across hand at thumb web space 19.8  At base of 2nd digit 6.1  (Blank rows = not tested)   LANDMARK LEFT   eval LEFT 03/05/24  10 cm proximal to olecranon process 28.7 27.2  Olecranon process 25.2 25  10  cm proximal to ulnar styloid process 19.9 19.3  Just proximal to ulnar styloid process 15.9 16.5  Across hand at thumb web space 19.8 20.3  At base of 2nd digit 6.2 6  (Blank rows = not tested)    Surgery type/Date: 02/05/24 L breast lumpectomy and SLNB Number of lymph nodes removed: 1/4 Current/past treatment (chemo, radiation, hormone therapy): radiation and hormone therapy Other symptoms:  Heaviness/tightness Yes Pain No Pitting edema No Infections No Decreased scar mobility Yes Stemmer sign No  TREATMENT PERFORMED: 03/19/24: Therapeutic Exercise: Pulleys x 2 min in direction of flexion and 2 min in direction of abduction with pt returning therapist demo, v/c for form Ball up wall x 10 reps in  direction of flexion and 10 reps in direction of L shoulder abduction Therapeutic Activities  Supine over half foam roll for following: Bil UE horz abd x 10 reps, bil UE scaption in a V x 10 reps, and then bil UE abduction in a snow angel x 10, 5 sec holds' pt returned demo and reports these stretches felt very good; these beneficial for improved shoulder ROM, improved posture and to focus on  muscle relaxation during to decrease guarding  Manual Therapy: PROM to L shoulder in direction of flexion and abduction MFR to cording in L axilla and antecubital fossa In sitting: cording in lateral breast palpable in this position so performed MFR to cording in L breast in seated postion with very thick cord palpable and visible  03/17/24: Therapeutic Exercises Pulleys x 2 min in direction of flexion and 2 min in direction of abduction with pt returning therapist demo, v/c for form Ball up wall x 10 reps in direction of flexion and 10 reps in direction of L shoulder abduction, reviewed correct technique Modified downward dog on wall x 10, 5 sec holds returning therapist demo Therapeutic Activities  Supine over half foam roll for following: Bil UE horz abd x 10 reps, bil UE scaption in a V x 10 reps, and then bil UE abduction in a snow angel x 10, 5 sec holds' pt returned demo and reports these stretches felt very good; these beneficial for improved shoulder ROM, improved posture and to focus on muscle relaxation during to decrease guarding  Manual Therapy PROM to Lt shoulder in direction of flexion, abduction, and D2 with scapular depression by therapist throughout and multiple VC's during to relax as pt struggled with this due to protective posturing. She was able to relax with cuing MFR to Lt axillary at area of multiple cords  03/12/24: Therapeutic Exercise: Pulleys x 2 min in direction of flexion and 2 min in direction of abduction with pt returning therapist demo, v/c for form Ball up wall x  10 reps in direction of flexion and 10 reps in direction of L shoulder abduction In supine: overhead flexion with dowel with 5 sec hold x 10 reps and L shoulder abduction with v/c to keep elbow straight x 10 reps with pt returning therapist demo for each Manual Therapy: PROM to L shoulder in direction of flexion and abduction Gentle STM to area of scar tissue in L axilla - unable to palpable cording in L breast in supine In sitting: cording in lateral breast palpable in this position so performed MFR to cording in L breast in seated postion with very thick cord palpable and visible  PATIENT EDUCATION:  Education details: axillary cording, need for stretching, info to obtain pulleys, seroma education, using chip pack and foam in bra for additional compression, scar massage, ABC class Person educated: Patient Education method: Explanation and Handouts Education comprehension: verbalized understanding  HOME EXERCISE PROGRAM: Reviewed previously given post op HEP. Pulleys x 2 min in direction of flexion and abduction  ASSESSMENT:  CLINICAL IMPRESSION: Pt felt a good stretch over the foam roll last session so continued with this. She still has considerable cording in her left axilla extending to forearm but does not feel increased tenderness in this area. The thick cord is still palpable in her lateral breast and pt gets discomfort in this area so focused on MFR to this cord in sitting.    Pt will benefit from skilled therapeutic intervention to improve on the following deficits: Decreased knowledge of precautions, impaired UE functional use, pain, decreased ROM, postural dysfunction.   PT treatment/interventions: ADL/Self care home management, [phone]- PT Re-evaluation, 97110-Therapeutic exercises, 97530- Therapeutic activity, [id number]- Neuromuscular re-education, 97535- Self Care, [id number]- Manual therapy, [phone]- Orthotic Initial, [phone]- Orthotic/Prosthetic subsequent, and Patient/Family  education   GOALS: Goals reviewed with patient? Yes  GOALS MET AT EVAL:  GOALS Name Target Date Goal status  1 Pt will be able to verbalize understanding  of pertinent lymphedema risk reduction practices relevant to her dx specifically related to skin care.  Baseline:  No knowledge Eval Achieved at eval  2 Pt will be able to return demo and/or verbalize understanding of the post op HEP related to regaining shoulder ROM. Baseline:  No knowledge Eval Achieved at eval  3 Pt will be able to verbalize understanding of the importance of viewing the post op After Breast CA Class video for further lymphedema risk reduction education and therapeutic exercise.  Baseline:  No knowledge Eval Achieved at eval   LONG TERM GOALS:  (STG=LTG)  GOALS Name Target Date  Goal status  1 Pt will demonstrate she has regained full shoulder ROM and function post operatively compared to baselines.  Baseline: 04/02/24 INITIAL  2 Pt will demonstrate 170 degrees of L shoulder abduction to allow her to reach out to the side without tightness. 04/02/24 INITIAL  3 Pt will demonstrate a 50% improvement in axillary and breast cording to decrease tightness and tenderness. 04/02/24 INITIAL  4 Pt will be independent in self MLD for management of breast swelling to decrease risk of infection. 04/02/24 INITIAL     PLAN:  PT FREQUENCY/DURATION: 2x/wk for 4 wks  PLAN FOR NEXT SESSION: Cont pulleys, ball, and stretches over half foam roll; also cont MFR to cording in Lt breast (in sitting) and axilla (in supine), MLD to L breast for post op swelling and seroma   [facility]  [address], [address]  [place] [address]  [phone]    Florina Sever [place], PT 03/19/2024, 10:58 AM

## 2024-03-20 ENCOUNTER — Other Ambulatory Visit: Payer: Self-pay

## 2024-03-20 ENCOUNTER — Ambulatory Visit
Admission: RE | Admit: 2024-03-20 | Discharge: 2024-03-20 | Disposition: A | Source: Ambulatory Visit | Attending: Radiation Oncology

## 2024-03-20 DIAGNOSIS — Z510 Encounter for antineoplastic radiation therapy: Secondary | ICD-10-CM | POA: Diagnosis not present

## 2024-03-20 LAB — RAD ONC ARIA SESSION SUMMARY
Course Elapsed Days: 3
Plan Fractions Treated to Date: 4
Plan Prescribed Dose Per Fraction: 2.67 Gy
Plan Total Fractions Prescribed: 15
Plan Total Prescribed Dose: 40.05 Gy
Reference Point Dosage Given to Date: 10.68 Gy
Reference Point Session Dosage Given: 2.67 Gy
Session Number: 4

## 2024-03-21 ENCOUNTER — Other Ambulatory Visit: Payer: Self-pay

## 2024-03-21 ENCOUNTER — Ambulatory Visit
Admission: RE | Admit: 2024-03-21 | Discharge: 2024-03-21 | Disposition: A | Discharge status: Home / Self Care | Source: Ambulatory Visit | Attending: Radiation Oncology | Admitting: Radiation Oncology

## 2024-03-21 DIAGNOSIS — Z510 Encounter for antineoplastic radiation therapy: Secondary | ICD-10-CM | POA: Diagnosis not present

## 2024-03-21 LAB — RAD ONC ARIA SESSION SUMMARY
Course Elapsed Days: 4
Plan Fractions Treated to Date: 5
Plan Prescribed Dose Per Fraction: 2.67 Gy
Plan Total Fractions Prescribed: 15
Plan Total Prescribed Dose: 40.05 Gy
Reference Point Dosage Given to Date: 13.35 Gy
Reference Point Session Dosage Given: 2.67 Gy
Session Number: 5

## 2024-03-23 ENCOUNTER — Ambulatory Visit
Admission: RE | Admit: 2024-03-23 | Discharge: 2024-03-23 | Disposition: A | Source: Ambulatory Visit | Attending: Radiation Oncology

## 2024-03-23 ENCOUNTER — Other Ambulatory Visit: Payer: Self-pay

## 2024-03-23 ENCOUNTER — Ambulatory Visit

## 2024-03-23 DIAGNOSIS — Z510 Encounter for antineoplastic radiation therapy: Secondary | ICD-10-CM | POA: Diagnosis not present

## 2024-03-23 LAB — RAD ONC ARIA SESSION SUMMARY
Course Elapsed Days: 6
Plan Fractions Treated to Date: 6
Plan Prescribed Dose Per Fraction: 2.67 Gy
Plan Total Fractions Prescribed: 15
Plan Total Prescribed Dose: 40.05 Gy
Reference Point Dosage Given to Date: 16.02 Gy
Reference Point Session Dosage Given: 2.67 Gy
Session Number: 6

## 2024-03-24 ENCOUNTER — Ambulatory Visit

## 2024-03-24 ENCOUNTER — Other Ambulatory Visit: Payer: Self-pay

## 2024-03-24 ENCOUNTER — Ambulatory Visit
Admission: RE | Admit: 2024-03-24 | Discharge: 2024-03-24 | Disposition: A | Discharge status: Home / Self Care | Source: Ambulatory Visit | Attending: Radiation Oncology | Admitting: Radiation Oncology

## 2024-03-24 DIAGNOSIS — Z510 Encounter for antineoplastic radiation therapy: Secondary | ICD-10-CM | POA: Diagnosis not present

## 2024-03-24 LAB — RAD ONC ARIA SESSION SUMMARY
Course Elapsed Days: 7
Plan Fractions Treated to Date: 7
Plan Prescribed Dose Per Fraction: 2.67 Gy
Plan Total Fractions Prescribed: 15
Plan Total Prescribed Dose: 40.05 Gy
Reference Point Dosage Given to Date: 18.69 Gy
Reference Point Session Dosage Given: 2.67 Gy
Session Number: 7

## 2024-03-25 ENCOUNTER — Ambulatory Visit

## 2024-03-25 ENCOUNTER — Ambulatory Visit
Admission: RE | Admit: 2024-03-25 | Discharge: 2024-03-25 | Disposition: A | Discharge status: Home / Self Care | Source: Ambulatory Visit | Attending: Radiation Oncology | Admitting: Radiation Oncology

## 2024-03-25 ENCOUNTER — Other Ambulatory Visit: Payer: Self-pay

## 2024-03-25 DIAGNOSIS — Z483 Aftercare following surgery for neoplasm: Secondary | ICD-10-CM

## 2024-03-25 DIAGNOSIS — M25612 Stiffness of left shoulder, not elsewhere classified: Secondary | ICD-10-CM

## 2024-03-25 DIAGNOSIS — Z510 Encounter for antineoplastic radiation therapy: Secondary | ICD-10-CM | POA: Diagnosis not present

## 2024-03-25 DIAGNOSIS — R293 Abnormal posture: Secondary | ICD-10-CM

## 2024-03-25 DIAGNOSIS — R600 Localized edema: Secondary | ICD-10-CM

## 2024-03-25 DIAGNOSIS — C50212 Malignant neoplasm of upper-inner quadrant of left female breast: Secondary | ICD-10-CM

## 2024-03-25 LAB — RAD ONC ARIA SESSION SUMMARY
Course Elapsed Days: 8
Plan Fractions Treated to Date: 8
Plan Prescribed Dose Per Fraction: 2.67 Gy
Plan Total Fractions Prescribed: 15
Plan Total Prescribed Dose: 40.05 Gy
Reference Point Dosage Given to Date: 21.36 Gy
Reference Point Session Dosage Given: 2.67 Gy
Session Number: 8

## 2024-03-25 NOTE — Therapy (Signed)
 OUTPATIENT PHYSICAL THERAPY BREAST CANCER TREATMENT   Patient Name: Stephanie Neal MRN: [id number] DOB:24-Dec-1955, 68 y.o., female Today's Date: 03/25/2024  END OF SESSION:  PT End of Session - 03/25/24 1006     Visit Number 6    Number of Visits 10    Date for Recertification  04/02/24    PT Start Time 1004    PT Stop Time 1059    PT Time Calculation (min) 55 min    Activity Tolerance Patient tolerated treatment well    Behavior During Therapy WFL for tasks assessed/performed           Past Medical History:  Diagnosis Date   Allergy    Arthritis    Breast cancer (HCC) 01/08/2024   Diabetes mellitus    Dysphonia    polypoid corditis   Hyperlipidemia    Hypertension    Past Surgical History:  Procedure Laterality Date   BREAST BIOPSY Left 01/08/2024   US  LT BREAST BX W LOC DEV 1ST LESION IMG BX SPEC US  GUIDE 01/08/2024 [facility]   BREAST BIOPSY Left 01/08/2024   US  LT BREAST BX W LOC DEV EA ADD LESION IMG BX SPEC US  GUIDE 01/08/2024 [facility]   BREAST BIOPSY  02/01/2024   MM LT RADIOACTIVE SEED LOC MAMMO GUIDE 02/01/2024 [facility]   BREAST BIOPSY  02/01/2024   MM LT RADIOACTIVE SEED EA ADD LESION LOC MAMMO GUIDE 02/01/2024 [facility]   BREAST LUMPECTOMY WITH RADIOACTIVE SEED AND SENTINEL LYMPH NODE BIOPSY Left 02/05/2024   Procedure: BREAST LUMPECTOMY WITH RADIOACTIVE SEED AND SENTINEL LYMPH NODE BIOPSY;  Surgeon: Ebbie Cough, MD;  Location: [facility] OR;  Service: General;  Laterality: Left;  GEN w/PEC BLOCK LEFT BREAST RADIOACTIVE SEED LOCALIZED LUMPECTOMY x2 LEFT AXILLARY SENTINEL NODE BIOPSY   COLONOSCOPY     x 2 - 2015, 2020   FOOT SURGERY     MICROLARYNGOSCOPY N/A 01/18/2022   Procedure: MICROLARYNGOSCOPY WITH EXCISION OF POLYPS;  Surgeon: Jesus Oliphant, MD;  Location: [facility];  Service: ENT;  Laterality: N/A;   THROAT SURGERY     Patient Active Problem List   Diagnosis Date Noted   Genetic testing 01/29/2024   Aortic  atherosclerosis 05/05/2021   Tetanus, diphtheria, and acellular pertussis (Tdap) vaccination declined 05/10/2020   Hyperlipidemia associated with type 2 diabetes mellitus (HCC) 11/18/2019   Chronic pain of right knee 11/18/2019   Mixed hyperlipidemia 06/25/2018   Over weight 06/25/2018   Stress due to family tension 11/13/2017   DM type 2 (diabetes mellitus, type 2) (HCC) 03/05/2014   HYPERCHOLESTEROLEMIA 03/12/2007   OBESITY 03/12/2007   Tobacco dependence 03/12/2007   HTN (hypertension) 03/12/2007   ALLERGIC RHINITIS, SEASONAL 03/12/2007    PCP: Barnie Louder, MD  REFERRING PROVIDER: Dr. Cough Ebbie   REFERRING DIAG: Left breast cancer    THERAPY DIAG:  Stiffness of left shoulder, not elsewhere classified  Aftercare following surgery for neoplasm  Localized edema  Abnormal posture  Malignant neoplasm of upper-inner quadrant of left breast in female, estrogen receptor positive (HCC)  Rationale for Evaluation and Treatment: Rehabilitation  ONSET DATE: 11/16/2023  SUBJECTIVE:  SUBJECTIVE STATEMENT: My shoulder is feeling better today than it did the last time.   PERTINENT HISTORY:  Patient was diagnosed on 11/16/2023 with left grade 2 invasive lobular carcinoma breast cancer. It measures 1.3 cm and 9 mm and is located in the upper inner quadrant. It is ER/PR positive and HER2 negative with a Ki67 of 10%.   PATIENT GOALS:  Reassess how my recovery is going related to arm function, pain, and swelling.  PAIN:  Are you having pain? No  PRECAUTIONS: Recent Surgery, left UE Lymphedema risk,   RED FLAGS: None   ACTIVITY LEVEL / LEISURE: has been doing post op exercises, has been walking at least 2 miles   OBJECTIVE:   PATIENT SURVEYS:  QUICK  DASH:      OBSERVATIONS: Cording visible in L breast and axilla, seroma/scar tissue palpable in L axilla and in L breast just medial to aerola  POSTURE:  Forward head and rounded shoulders posture   UPPER EXTREMITY AROM/PROM:   A/PROM RIGHT   eval    Shoulder extension 52  Shoulder flexion 151  Shoulder abduction 170  Shoulder internal rotation 70  Shoulder external rotation 82                          (Blank rows = not tested)   A/PROM LEFT   eval LEFT 03/05/24  Shoulder extension 48 70  Shoulder flexion 151 165  Shoulder abduction 172 165  Shoulder internal rotation 75 52  Shoulder external rotation 73 88                          (Blank rows = not tested)   CERVICAL AROM: All within normal limits   UPPER EXTREMITY STRENGTH: WNL   LYMPHEDEMA ASSESSMENTS (in cm):    LANDMARK RIGHT   eval  10 cm proximal to olecranon process 28.3  Olecranon process 25.4  10 cm proximal to ulnar styloid process 20.9  Just proximal to ulnar styloid process 15.9  Across hand at thumb web space 19.8  At base of 2nd digit 6.1  (Blank rows = not tested)   LANDMARK LEFT   eval LEFT 03/05/24  10 cm proximal to olecranon process 28.7 27.2  Olecranon process 25.2 25  10  cm proximal to ulnar styloid process 19.9 19.3  Just proximal to ulnar styloid process 15.9 16.5  Across hand at thumb web space 19.8 20.3  At base of 2nd digit 6.2 6  (Blank rows = not tested)    Surgery type/Date: 02/05/24 L breast lumpectomy and SLNB Number of lymph nodes removed: 1/4 Current/past treatment (chemo, radiation, hormone therapy): radiation and hormone therapy Other symptoms:  Heaviness/tightness Yes Pain No Pitting edema No Infections No Decreased scar mobility Yes Stemmer sign No  TREATMENT PERFORMED: 03/25/24: Therapeutic Exercise: Pulleys x 2 min in direction of flexion and 2 min in direction of abduction with pt returning therapist demo, v/c for form Ball up wall x 10 reps in  direction of flexion and 10 reps in direction of L shoulder abduction Therapeutic Activities  Supine over half foam roll for following: Bil UE horz abd x 10 reps, bil UE scaption in a V x 10 reps, and then bil UE abduction in a snow angel x 10, 5 sec holds; these cont to be beneficial for improved shoulder ROM, improved posture and to focus on muscle relaxation during to decrease guarding ; pt able to  demonstrate improved ROM with these today  Manual Therapy: PROM to L shoulder in direction of flexion, abduction, and D2 with scapular depression by therapist throughout, pt still requires VC's to relax due to muscle guarding but less so MFR to cording in Lt axilla  03/19/24: Therapeutic Exercise: Pulleys x 2 min in direction of flexion and 2 min in direction of abduction with pt returning therapist demo, v/c for form Ball up wall x 10 reps in direction of flexion and 10 reps in direction of L shoulder abduction Therapeutic Activities  Supine over half foam roll for following: Bil UE horz abd x 10 reps, bil UE scaption in a V x 10 reps, and then bil UE abduction in a snow angel x 10, 5 sec holds' pt returned demo and reports these stretches felt very good; these beneficial for improved shoulder ROM, improved posture and to focus on muscle relaxation during to decrease guarding  Manual Therapy: PROM to L shoulder in direction of flexion and abduction MFR to cording in L axilla and antecubital fossa In sitting: cording in lateral breast palpable in this position so performed MFR to cording in L breast in seated postion with very thick cord palpable and visible  03/17/24: Therapeutic Exercises Pulleys x 2 min in direction of flexion and 2 min in direction of abduction with pt returning therapist demo, v/c for form Ball up wall x 10 reps in direction of flexion and 10 reps in direction of L shoulder abduction, reviewed correct technique Modified downward dog on wall x 10, 5 sec holds returning  therapist demo Therapeutic Activities  Supine over half foam roll for following: Bil UE horz abd x 10 reps, bil UE scaption in a V x 10 reps, and then bil UE abduction in a snow angel x 10, 5 sec holds' pt returned demo and reports these stretches felt very good; these beneficial for improved shoulder ROM, improved posture and to focus on muscle relaxation during to decrease guarding  Manual Therapy PROM to Lt shoulder in direction of flexion, abduction, and D2 with scapular depression by therapist throughout and multiple VC's during to relax as pt struggled with this due to protective posturing. She was able to relax with cuing MFR to Lt axillary at area of multiple cords  03/12/24: Therapeutic Exercise: Pulleys x 2 min in direction of flexion and 2 min in direction of abduction with pt returning therapist demo, v/c for form Ball up wall x 10 reps in direction of flexion and 10 reps in direction of L shoulder abduction In supine: overhead flexion with dowel with 5 sec hold x 10 reps and L shoulder abduction with v/c to keep elbow straight x 10 reps with pt returning therapist demo for each Manual Therapy: PROM to L shoulder in direction of flexion and abduction Gentle STM to area of scar tissue in L axilla - unable to palpable cording in L breast in supine In sitting: cording in lateral breast palpable in this position so performed MFR to cording in L breast in seated postion with very thick cord palpable and visible  PATIENT EDUCATION:  Education details: axillary cording, need for stretching, info to obtain pulleys, seroma education, using chip pack and foam in bra for additional compression, scar massage, ABC class Person educated: Patient Education method: Explanation and Handouts Education comprehension: verbalized understanding  HOME EXERCISE PROGRAM: Reviewed previously given post op HEP. Pulleys x 2 min in direction of flexion and abduction  ASSESSMENT:  CLINICAL  IMPRESSION: Pt  conts to report feeling good stretches with AA/ROM stretches and her end motion has improved. Also P/ROM is improved and cording slightly less visible. Pt does still require VC's to relax due to muscle guarding but less so demonstrating less muscle tightness.   Pt will benefit from skilled therapeutic intervention to improve on the following deficits: Decreased knowledge of precautions, impaired UE functional use, pain, decreased ROM, postural dysfunction.   PT treatment/interventions: ADL/Self care home management, [phone]- PT Re-evaluation, 97110-Therapeutic exercises, 97530- Therapeutic activity, [id number]- Neuromuscular re-education, 97535- Self Care, [id number]- Manual therapy, [phone]- Orthotic Initial, [phone]- Orthotic/Prosthetic subsequent, and Patient/Family education   GOALS: Goals reviewed with patient? Yes  GOALS MET AT EVAL:  GOALS Name Target Date Goal status  1 Pt will be able to verbalize understanding of pertinent lymphedema risk reduction practices relevant to her dx specifically related to skin care.  Baseline:  No knowledge Eval Achieved at eval  2 Pt will be able to return demo and/or verbalize understanding of the post op HEP related to regaining shoulder ROM. Baseline:  No knowledge Eval Achieved at eval  3 Pt will be able to verbalize understanding of the importance of viewing the post op After Breast CA Class video for further lymphedema risk reduction education and therapeutic exercise.  Baseline:  No knowledge Eval Achieved at eval   LONG TERM GOALS:  (STG=LTG)  GOALS Name Target Date  Goal status  1 Pt will demonstrate she has regained full shoulder ROM and function post operatively compared to baselines.  Baseline: 04/02/24 INITIAL  2 Pt will demonstrate 170 degrees of L shoulder abduction to allow her to reach out to the side without tightness. 04/02/24 INITIAL  3 Pt will demonstrate a 50% improvement in axillary and breast cording to decrease tightness and  tenderness. 04/02/24 INITIAL  4 Pt will be independent in self MLD for management of breast swelling to decrease risk of infection. 04/02/24 INITIAL     PLAN:  PT FREQUENCY/DURATION: 2x/wk for 4 wks  PLAN FOR NEXT SESSION: Cont pulleys, ball, and stretches over half foam roll; also cont MFR to cording in Lt breast (in sitting) and axilla (in supine), MLD to L breast for post op swelling and seroma   [facility]  [address], [address]  [place] [address]  [phone]    Aden Berwyn Caldron, PTA 03/25/2024, 11:05 AM

## 2024-03-26 ENCOUNTER — Ambulatory Visit
Admission: RE | Admit: 2024-03-26 | Discharge: 2024-03-26 | Disposition: A | Discharge status: Home / Self Care | Source: Ambulatory Visit | Attending: Radiation Oncology | Admitting: Radiation Oncology

## 2024-03-26 ENCOUNTER — Ambulatory Visit

## 2024-03-26 ENCOUNTER — Other Ambulatory Visit: Payer: Self-pay

## 2024-03-26 DIAGNOSIS — Z510 Encounter for antineoplastic radiation therapy: Secondary | ICD-10-CM | POA: Diagnosis not present

## 2024-03-26 LAB — RAD ONC ARIA SESSION SUMMARY
Course Elapsed Days: 9
Plan Fractions Treated to Date: 9
Plan Prescribed Dose Per Fraction: 2.67 Gy
Plan Total Fractions Prescribed: 15
Plan Total Prescribed Dose: 40.05 Gy
Reference Point Dosage Given to Date: 24.03 Gy
Reference Point Session Dosage Given: 2.67 Gy
Session Number: 9

## 2024-03-31 ENCOUNTER — Other Ambulatory Visit: Payer: Self-pay

## 2024-03-31 ENCOUNTER — Ambulatory Visit: Attending: General Surgery

## 2024-03-31 ENCOUNTER — Ambulatory Visit
Admission: RE | Admit: 2024-03-31 | Discharge: 2024-03-31 | Disposition: A | Source: Ambulatory Visit | Attending: Radiation Oncology

## 2024-03-31 DIAGNOSIS — C50212 Malignant neoplasm of upper-inner quadrant of left female breast: Secondary | ICD-10-CM | POA: Insufficient documentation

## 2024-03-31 DIAGNOSIS — R600 Localized edema: Secondary | ICD-10-CM | POA: Insufficient documentation

## 2024-03-31 DIAGNOSIS — R293 Abnormal posture: Secondary | ICD-10-CM | POA: Diagnosis present

## 2024-03-31 DIAGNOSIS — Z483 Aftercare following surgery for neoplasm: Secondary | ICD-10-CM | POA: Insufficient documentation

## 2024-03-31 DIAGNOSIS — M25612 Stiffness of left shoulder, not elsewhere classified: Secondary | ICD-10-CM | POA: Insufficient documentation

## 2024-03-31 DIAGNOSIS — Z170 Estrogen receptor positive status [ER+]: Secondary | ICD-10-CM | POA: Insufficient documentation

## 2024-03-31 LAB — RAD ONC ARIA SESSION SUMMARY
Course Elapsed Days: 14
Plan Fractions Treated to Date: 10
Plan Prescribed Dose Per Fraction: 2.67 Gy
Plan Total Fractions Prescribed: 15
Plan Total Prescribed Dose: 40.05 Gy
Reference Point Dosage Given to Date: 26.7 Gy
Reference Point Session Dosage Given: 2.67 Gy
Session Number: 10

## 2024-03-31 NOTE — Patient Instructions (Addendum)
 Stephanie Neal

## 2024-03-31 NOTE — Therapy (Signed)
 OUTPATIENT PHYSICAL THERAPY BREAST CANCER TREATMENT   Patient Name: Stephanie Neal MRN: [id number] DOB:June 25, 1955, 68 y.o., female Today's Date: 03/31/2024  END OF SESSION:  PT End of Session - 03/31/24 1002     Visit Number 7    Number of Visits 10    Date for Recertification  04/02/24    PT Start Time 1000    PT Stop Time 1055    PT Time Calculation (min) 55 min    Activity Tolerance Patient tolerated treatment well    Behavior During Therapy WFL for tasks assessed/performed           Past Medical History:  Diagnosis Date   Allergy    Arthritis    Breast cancer (HCC) 01/08/2024   Diabetes mellitus    Dysphonia    polypoid corditis   Hyperlipidemia    Hypertension    Past Surgical History:  Procedure Laterality Date   BREAST BIOPSY Left 01/08/2024   US  LT BREAST BX W LOC DEV 1ST LESION IMG BX SPEC US  GUIDE 01/08/2024 [facility]   BREAST BIOPSY Left 01/08/2024   US  LT BREAST BX W LOC DEV EA ADD LESION IMG BX SPEC US  GUIDE 01/08/2024 [facility]   BREAST BIOPSY  02/01/2024   MM LT RADIOACTIVE SEED LOC MAMMO GUIDE 02/01/2024 [facility]   BREAST BIOPSY  02/01/2024   MM LT RADIOACTIVE SEED EA ADD LESION LOC MAMMO GUIDE 02/01/2024 [facility]   BREAST LUMPECTOMY WITH RADIOACTIVE SEED AND SENTINEL LYMPH NODE BIOPSY Left 02/05/2024   Procedure: BREAST LUMPECTOMY WITH RADIOACTIVE SEED AND SENTINEL LYMPH NODE BIOPSY;  Surgeon: Ebbie Cough, MD;  Location: [facility] OR;  Service: General;  Laterality: Left;  GEN w/PEC BLOCK LEFT BREAST RADIOACTIVE SEED LOCALIZED LUMPECTOMY x2 LEFT AXILLARY SENTINEL NODE BIOPSY   COLONOSCOPY     x 2 - 2015, 2020   FOOT SURGERY     MICROLARYNGOSCOPY N/A 01/18/2022   Procedure: MICROLARYNGOSCOPY WITH EXCISION OF POLYPS;  Surgeon: Jesus Oliphant, MD;  Location: [facility] OR;  Service: ENT;  Laterality: N/A;   THROAT SURGERY     Patient Active Problem List   Diagnosis Date Noted   Genetic testing 01/29/2024   Aortic  atherosclerosis 05/05/2021   Tetanus, diphtheria, and acellular pertussis (Tdap) vaccination declined 05/10/2020   Hyperlipidemia associated with type 2 diabetes mellitus (HCC) 11/18/2019   Chronic pain of right knee 11/18/2019   Mixed hyperlipidemia 06/25/2018   Over weight 06/25/2018   Stress due to family tension 11/13/2017   DM type 2 (diabetes mellitus, type 2) (HCC) 03/05/2014   HYPERCHOLESTEROLEMIA 03/12/2007   OBESITY 03/12/2007   Tobacco dependence 03/12/2007   HTN (hypertension) 03/12/2007   ALLERGIC RHINITIS, SEASONAL 03/12/2007    PCP: Barnie Louder, MD  REFERRING PROVIDER: Dr. Cough Ebbie   REFERRING DIAG: Left breast cancer    THERAPY DIAG:  Stiffness of left shoulder, not elsewhere classified  Aftercare following surgery for neoplasm  Localized edema  Abnormal posture  Malignant neoplasm of upper-inner quadrant of left breast in female, estrogen receptor positive (HCC)  Rationale for Evaluation and Treatment: Rehabilitation  ONSET DATE: 11/16/2023  SUBJECTIVE:  SUBJECTIVE STATEMENT: I am doing really well. My Lt shoulder moves so much better now than it used to.   PERTINENT HISTORY:  Patient was diagnosed on 11/16/2023 with left grade 2 invasive lobular carcinoma breast cancer. It measures 1.3 cm and 9 mm and is located in the upper inner quadrant. It is ER/PR positive and HER2 negative with a Ki67 of 10%.   PATIENT GOALS:  Reassess how my recovery is going related to arm function, pain, and swelling.  PAIN:  Are you having pain? No  PRECAUTIONS: Recent Surgery, left UE Lymphedema risk,   RED FLAGS: None   ACTIVITY LEVEL / LEISURE: has been doing post op exercises, has been walking at least 2 miles   OBJECTIVE:   PATIENT SURVEYS:  QUICK  DASH:      OBSERVATIONS: Cording visible in L breast and axilla, seroma/scar tissue palpable in L axilla and in L breast just medial to aerola  POSTURE:  Forward head and rounded shoulders posture   UPPER EXTREMITY AROM/PROM:   A/PROM RIGHT   eval    Shoulder extension 52  Shoulder flexion 151  Shoulder abduction 170  Shoulder internal rotation 70  Shoulder external rotation 82                          (Blank rows = not tested)   A/PROM LEFT   eval LEFT 03/05/24  Shoulder extension 48 70  Shoulder flexion 151 165  Shoulder abduction 172 165  Shoulder internal rotation 75 52  Shoulder external rotation 73 88                          (Blank rows = not tested)   CERVICAL AROM: All within normal limits   UPPER EXTREMITY STRENGTH: WNL   LYMPHEDEMA ASSESSMENTS (in cm):    LANDMARK RIGHT   eval  10 cm proximal to olecranon process 28.3  Olecranon process 25.4  10 cm proximal to ulnar styloid process 20.9  Just proximal to ulnar styloid process 15.9  Across hand at thumb web space 19.8  At base of 2nd digit 6.1  (Blank rows = not tested)   LANDMARK LEFT   eval LEFT 03/05/24  10 cm proximal to olecranon process 28.7 27.2  Olecranon process 25.2 25  10  cm proximal to ulnar styloid process 19.9 19.3  Just proximal to ulnar styloid process 15.9 16.5  Across hand at thumb web space 19.8 20.3  At base of 2nd digit 6.2 6  (Blank rows = not tested)    Surgery type/Date: 02/05/24 L breast lumpectomy and SLNB Number of lymph nodes removed: 1/4 Current/past treatment (chemo, radiation, hormone therapy): radiation and hormone therapy Other symptoms:  Heaviness/tightness Yes Pain No Pitting edema No Infections No Decreased scar mobility Yes Stemmer sign No  TREATMENT PERFORMED: 03/31/24: Therapeutic Exercise: Pulleys x 1:30 min in direction of flexion and 1:30 min in direction of abduction with VC's to hold end ROM stretch Ball up wall x 10 reps in direction of  flexion and 10 reps in direction of L shoulder abduction with 1# added to Lt wrist  Therapeutic Activities  Supine over half foam roll for following with 1# to Lt wrist for increased stretch: Bil UE horz abd x 10 reps, bil UE scaption in a V x 10 reps, and then bil UE abduction in a snow angel x 10, 5 sec holds; these cont to be beneficial for  improved shoulder ROM, improved posture and to focus on muscle relaxation during to decrease guarding ; pt able to demonstrate improved ROM with these today  Supine scapular series with yellow theraband x 5-10 each returning therapist demo for each and VC's during for correct UE positioning, HEP issued Manual Therapy: MLD to Lt breast: Short neck, 5 diaphragmatic breaths, Rt axillary and Lt inguinal nodes, anterior inter-axillary and Lt axillo-inguinal anastomosis, then Lt superior and medial breast redirecting towards anterior anastomosis, then into Rt S/L for focus to lateral and inferior breast redirecting towards lateral anastomosis, then finished retracing all steps in supine. Had pt return demo of each step and instructed her in basics of anatomy of lymphatic system and principles of MLD while performing. She benefited from hand over hand and VC's for correct direction of skin stretches and pressure.   03/25/24: Therapeutic Exercise: Pulleys x 2 min in direction of flexion and 2 min in direction of abduction with pt returning therapist demo, v/c for form Ball up wall x 10 reps in direction of flexion and 10 reps in direction of L shoulder abduction Therapeutic Activities  Supine over half foam roll for following: Bil UE horz abd x 10 reps, bil UE scaption in a V x 10 reps, and then bil UE abduction in a snow angel x 10, 5 sec holds; these cont to be beneficial for improved shoulder ROM, improved posture and to focus on muscle relaxation during to decrease guarding ; pt able to demonstrate improved ROM with these today  Manual Therapy: PROM to L  shoulder in direction of flexion, abduction, and D2 with scapular depression by therapist throughout, pt still requires VC's to relax due to muscle guarding but less so MFR to cording in Lt axilla  03/19/24: Therapeutic Exercise: Pulleys x 2 min in direction of flexion and 2 min in direction of abduction with pt returning therapist demo, v/c for form Ball up wall x 10 reps in direction of flexion and 10 reps in direction of L shoulder abduction Therapeutic Activities  Supine over half foam roll for following: Bil UE horz abd x 10 reps, bil UE scaption in a V x 10 reps, and then bil UE abduction in a snow angel x 10, 5 sec holds' pt returned demo and reports these stretches felt very good; these beneficial for improved shoulder ROM, improved posture and to focus on muscle relaxation during to decrease guarding  Manual Therapy: PROM to L shoulder in direction of flexion and abduction MFR to cording in L axilla and antecubital fossa In sitting: cording in lateral breast palpable in this position so performed MFR to cording in L breast in seated postion with very thick cord palpable and visible      PATIENT EDUCATION:  Education details: Supine scapular series with yellow theraband and breast self MLD Person educated: Patient Education method: Explanation and Handouts, demonstration and VC's for technique during Education comprehension: verbalized understanding, returned demo and will benefit from further review  HOME EXERCISE PROGRAM: Reviewed previously given post op HEP. Pulleys x 2 min in direction of flexion and abduction 03/31/24 - Supine Scapular Series with yellow theraband and self breast MLD  ASSESSMENT:  CLINICAL IMPRESSION: Pt reports feeling much improved since start of care with her end ROM of Lt shoulder. Progressed HEP to include supine scapular series with yellow theraband for postural strength. Pt is nearing end of Lt breast radiation and has increased medial breast  lymphedema with peau d'orange present so began and instructed  pt in Lt breast MLD today. Had her return demo and required tactile cuing for correct light pressure and skin stretch only without slide which she struggled with initially. She will benefit from further review of this.   Pt will benefit from skilled therapeutic intervention to improve on the following deficits: Decreased knowledge of precautions, impaired UE functional use, pain, decreased ROM, postural dysfunction.   PT treatment/interventions: ADL/Self care home management, [phone]- PT Re-evaluation, 97110-Therapeutic exercises, 97530- Therapeutic activity, [id number]- Neuromuscular re-education, 97535- Self Care, [id number]- Manual therapy, [phone]- Orthotic Initial, [phone]- Orthotic/Prosthetic subsequent, and Patient/Family education   GOALS: Goals reviewed with patient? Yes  GOALS MET AT EVAL:  GOALS Name Target Date Goal status  1 Pt will be able to verbalize understanding of pertinent lymphedema risk reduction practices relevant to her dx specifically related to skin care.  Baseline:  No knowledge Eval Achieved at eval  2 Pt will be able to return demo and/or verbalize understanding of the post op HEP related to regaining shoulder ROM. Baseline:  No knowledge Eval Achieved at eval  3 Pt will be able to verbalize understanding of the importance of viewing the post op After Breast CA Class video for further lymphedema risk reduction education and therapeutic exercise.  Baseline:  No knowledge Eval Achieved at eval   LONG TERM GOALS:  (STG=LTG)  GOALS Name Target Date  Goal status  1 Pt will demonstrate she has regained full shoulder ROM and function post operatively compared to baselines.  Baseline: 04/02/24 INITIAL  2 Pt will demonstrate 170 degrees of L shoulder abduction to allow her to reach out to the side without tightness. 04/02/24 INITIAL  3 Pt will demonstrate a 50% improvement in axillary and breast cording to decrease tightness  and tenderness. 04/02/24 MET   03/31/24 - Pt reports 100% improved with this  4 Pt will be independent in self MLD for management of breast swelling to decrease risk of infection. 04/02/24 INITIAL     PLAN:  PT FREQUENCY/DURATION: 2x/wk for 4 wks  PLAN FOR NEXT SESSION: Possible D/C next per POC if pt independent with self MLD. Review self breast MLD and new HEP issued today and assess remaining unmet goals for D/C.   Berwyn Knights, PTA 03/31/24 11:34 AM  [facility]  [address], [address]  [place] [address]  [phone]   Over Head Pull: Narrow and Wide Grip   [facility] [phone]   On back, knees bent, feet flat, band across thighs, elbows straight but relaxed. Pull hands apart (start). Keeping elbows straight, bring arms up and over head, hands toward floor. Keep pull steady on band. Hold momentarily. Return slowly, keeping pull steady, back to start. Then do same with a wider grip on the band (past shoulder width) Repeat _5-10__ times. Band color __yellow____   Side Pull: Double Arm   On back, knees bent, feet flat. Arms perpendicular to body, shoulder level, elbows straight but relaxed. Pull arms out to sides, elbows straight. Resistance band comes across collarbones, hands toward floor. Hold momentarily. Slowly return to starting position. Repeat _5-10__ times. Band color _yellow____   Sword   On back, knees bent, feet flat, left hand on left hip, right hand above left. Pull right arm DIAGONALLY (hip to shoulder) across chest. Bring right arm along head toward floor. Hold momentarily. Slowly return to starting position. Repeat _5-10__ times. Do with left arm. Band color _yellow_____   Shoulder Rotation: Double Arm   On back, knees bent,  feet flat, elbows tucked at sides, bent 90, hands palms up. Pull hands apart and down toward floor, keeping elbows near sides. Hold momentarily. Slowly return to starting position. Repeat _5-10__ times.  Band color __yellow____   [facility] Re[facility] [phone]  Self manual lymph drainage: Perform this sequence once a day.  Only give enough pressure no your skin to make the skin move.  Hug yourself.  Do circles at your neck just above your collarbones.  Repeat this 10 times.  Diaphragmatic - Supine   Inhale through nose making navel move out toward hands. Exhale through puckered lips, hands follow navel in. Repeat _5__ times. Rest _10__ seconds between repeats.    Axilla - One at a Time   Using full weight of flat hand and fingers at center of uninvolved armpit, make _10__ in-place circles.   Copyright  VHI. All rights reserved.  LEG: Inguinal Nodes Stimulation   With small finger side of hand against hip crease on involved side, gently perform circles at the crease. Repeat __10_ times.   Copyright  VHI. All rights reserved.  Axilla to Inguinal Nodes - Sweep   On involved side, pump _4__ times from armpit along side of trunk to hip crease.  Now gently stretch skin from the involved side to the uninvolved side across the chest at the shoulder line.  Repeat that 4 times.  Draw an imaginary diagonal line from upper outer breast through the nipple area toward lower inner breast.  Direct fluid upward and inward from this line toward the pathway across your upper chest .  Do this in three rows to treat all of the upper inner breast tissue, and do each row 3-4x.      Direct fluid to treat all of lower outer breast tissue downward and outward toward pathway that is aimed at the left groin. This is easiest to do laying on your right side.   Finish by doing the pathways as described above going from your involved armpit to the same side groin and going across your upper chest from the involved shoulder to the uninvolved shoulder.  Repeat the steps above where you do circles in your left groin and right armpit.

## 2024-04-01 ENCOUNTER — Encounter: Payer: Self-pay | Admitting: Internal Medicine

## 2024-04-01 ENCOUNTER — Other Ambulatory Visit: Payer: Self-pay

## 2024-04-01 ENCOUNTER — Ambulatory Visit
Admission: RE | Admit: 2024-04-01 | Discharge: 2024-04-01 | Disposition: A | Source: Ambulatory Visit | Attending: Radiation Oncology

## 2024-04-01 ENCOUNTER — Ambulatory Visit: Attending: Internal Medicine | Admitting: Internal Medicine

## 2024-04-01 VITALS — BP 117/73 | HR 98 | Temp 97.8°F | Ht 71.0 in | Wt 175.0 lb

## 2024-04-01 DIAGNOSIS — I152 Hypertension secondary to endocrine disorders: Secondary | ICD-10-CM

## 2024-04-01 DIAGNOSIS — E785 Hyperlipidemia, unspecified: Secondary | ICD-10-CM

## 2024-04-01 DIAGNOSIS — C50212 Malignant neoplasm of upper-inner quadrant of left female breast: Secondary | ICD-10-CM

## 2024-04-01 DIAGNOSIS — Z7984 Long term (current) use of oral hypoglycemic drugs: Secondary | ICD-10-CM

## 2024-04-01 DIAGNOSIS — Z170 Estrogen receptor positive status [ER+]: Secondary | ICD-10-CM

## 2024-04-01 DIAGNOSIS — E1169 Type 2 diabetes mellitus with other specified complication: Secondary | ICD-10-CM

## 2024-04-01 DIAGNOSIS — K635 Polyp of colon: Secondary | ICD-10-CM | POA: Diagnosis not present

## 2024-04-01 DIAGNOSIS — E1159 Type 2 diabetes mellitus with other circulatory complications: Secondary | ICD-10-CM | POA: Diagnosis not present

## 2024-04-01 LAB — RAD ONC ARIA SESSION SUMMARY
Course Elapsed Days: 15
Plan Fractions Treated to Date: 11
Plan Prescribed Dose Per Fraction: 2.67 Gy
Plan Total Fractions Prescribed: 15
Plan Total Prescribed Dose: 40.05 Gy
Reference Point Dosage Given to Date: 29.37 Gy
Reference Point Session Dosage Given: 2.67 Gy
Session Number: 11

## 2024-04-01 MED ORDER — ATORVASTATIN CALCIUM 80 MG PO TABS: 80.0000 mg | ORAL_TABLET | Freq: Every day | ORAL | 1 refills | Status: AC

## 2024-04-01 MED ORDER — ALBUTEROL SULFATE HFA 108 (90 BASE) MCG/ACT IN AERS: 2.0000 | INHALATION_SPRAY | RESPIRATORY_TRACT | 1 refills | Status: AC | PRN

## 2024-04-01 MED ORDER — GLIMEPIRIDE 4 MG PO TABS: 4.0000 mg | ORAL_TABLET | Freq: Two times a day (BID) | ORAL | 1 refills | Status: AC

## 2024-04-01 MED ORDER — AMLODIPINE BESYLATE 10 MG PO TABS: 10.0000 mg | ORAL_TABLET | Freq: Every day | ORAL | 1 refills | Status: AC

## 2024-04-01 MED ORDER — METFORMIN HCL 1000 MG PO TABS: ORAL_TABLET | ORAL | 1 refills | Status: AC

## 2024-04-01 MED ORDER — LISINOPRIL 30 MG PO TABS: 30.0000 mg | ORAL_TABLET | Freq: Every day | ORAL | 1 refills | Status: AC

## 2024-04-01 NOTE — Patient Instructions (Signed)
  VISIT SUMMARY: You had a follow-up appointment to review your breast cancer treatment and diabetes management. You are currently undergoing radiation therapy for breast cancer and have been experiencing fatigue. Your diabetes management was also discussed, and your recent A1c levels show improvement. General health maintenance, including eye exams and colon cancer screening, was also reviewed.  YOUR PLAN: -STAGE I LEFT BREAST CANCER: You have Stage I breast cancer in your left breast and have undergone a lumpectomy. You are currently receiving radiation therapy, which will be completed on April 14, 2024. After radiation, you will start taking anastrozole for 5-7 years to help prevent the cancer from coming back. Your genetic test for the breast cancer gene was negative. It is also important for your daughters to have regular breast exams and mammograms.  -TYPE 2 DIABETES MELLITUS: You have Type 2 diabetes, which means your body has trouble regulating blood sugar levels. Your recent A1c level is 7.3%, which is an improvement from 7.8% in June. You should continue taking glimepiride  4 mg twice daily and metformin  1000 mg twice daily. We will look into your insurance coverage to get you the Jardiance  prescription you need. It is important to monitor your blood sugar levels regularly, even with your busy schedule.  -GENERAL HEALTH MAINTENANCE: Routine health maintenance was discussed. You need to have regular eye exams and a colon cancer screening. An eye exam referral was provided, and a colonoscopy is scheduled for February 2026. A urine sample was also taken for your annual protein check.  INSTRUCTIONS: Please continue with your radiation therapy as scheduled. After completing radiation, you will start anastrozole therapy. Make sure your daughters get regular breast exams and mammograms. Continue taking your diabetes medications as prescribed and monitor your blood sugar levels regularly. We will work  on getting your Jardiance  prescription. Schedule your eye exam with [facility] and keep your colonoscopy appointment in February 2026. Follow up with any additional lab results as needed.                      Contains text generated by [employer].                                 Contains text generated by [employer].

## 2024-04-01 NOTE — Progress Notes (Signed)
 Patient ID: Stephanie Neal, female    DOB: 02-04-1956  MRN: [id number]  CC: Diabetes (DM f/u. /No questions / concerns/Already received flu vax)   Subjective: Stephanie Neal is a 68 y.o. female who presents for chronic ds management. Her concerns today include:  Hx of DM, HTN, former smoker, obesity, HL, vocal polyp removed 12/2021, Invasive Lobular CA LT breast ER/PR+, HER - 12/2023.   Discussed the use of AI scribe software for clinical note transcription with the patient, who gave verbal consent to proceed.  History of Present Illness   Stephanie Neal is a 68 year old female with breast cancer and diabetes who presents for follow-up after breast cancer treatment.  She was diagnosed with breast cancer in the left breast and underwent a lumpectomy. She is currently undergoing radiation therapy, which is scheduled to be completed on April 14, 2024 and lymphedema PT on breast daily. Genetic testing for the breast cancer gene was negative. Feels fatigue since treatment started.  She lives on the second floor of an apartment building and finds climbing stairs tiring, especially with the fatigue from radiation therapy. She wants to move to a first-floor unit to avoid the stairs. Some one had asked her about if she needs PCS services. She is not interested as she feels she is functional in her home environment and cooks, bath and cloth self.  DM: She has diabetes, with her last A1c recorded at 7.3% in October, down from 7.8% in June. She is currently taking glimepiride  4 mg twice daily and metformin  1000 mg twice daily. She was supposed to start Jardiance  10 mg daily as of last visit with me, but she never received the prescription. She does not check her blood sugars regularly due to her busy schedule with cancer treatments. -Her diet includes a lot of fruits and vegetables, and she reports an increased appetite since her cancer diagnosis. Her weight has remained stable at 175  pounds since June.  HTN/HL: She is on amlodipine  10 mg daily and lisinopril  30 mg daily for blood pressure management, and atorvastatin  80 mg daily for cholesterol. Reports compliance. No CP or LE edema  HM: Due for repeat colonoscopy.  She has history of multiple sessile polyps and last colonoscopy was 5 years ago.  She has been called but she wants to hold off until she completes radiation treatment.  Due for diabetic eye exam.  She goes to [facility].     Patient Active Problem List   Diagnosis Date Noted   Genetic testing 01/29/2024   Aortic atherosclerosis 05/05/2021   Tetanus, diphtheria, and acellular pertussis (Tdap) vaccination declined 05/10/2020   Hyperlipidemia associated with type 2 diabetes mellitus (HCC) 11/18/2019   Chronic pain of right knee 11/18/2019   Mixed hyperlipidemia 06/25/2018   Over weight 06/25/2018   Stress due to family tension 11/13/2017   DM type 2 (diabetes mellitus, type 2) (HCC) 03/05/2014   HYPERCHOLESTEROLEMIA 03/12/2007   OBESITY 03/12/2007   Tobacco dependence 03/12/2007   HTN (hypertension) 03/12/2007   ALLERGIC RHINITIS, SEASONAL 03/12/2007     Current Outpatient Medications on File Prior to Visit  Medication Sig Dispense Refill   Accu-Chek Softclix Lancets lancets Use as instructed to check blood sugar once a day. E11.9 100 each 6   Blood Glucose Monitoring Suppl (ACCU-CHEK GUIDE ME) w/Device KIT Use as directed 1 kit 0   Blood Pressure Monitoring (BLOOD PRESSURE CUFF) MISC Use to check blood pressure once daily. 1 each 0  fluticasone  (FLONASE ) 50 MCG/ACT nasal spray Place 1 spray into both nostrils daily. 16 g 6   glucose blood (ACCU-CHEK GUIDE TEST) test strip Use as instructed to check blood sugar once a day. E11.9 100 each 6   No current facility-administered medications on file prior to visit.    Allergies  Allergen Reactions   Penicillins Other (See Comments)    blanked out  Has patient had a PCN reaction causing  immediate rash, facial/tongue/throat swelling, SOB or lightheadedness with hypotension: No  Has patient had a PCN reaction causing severe rash involving mucus membranes or skin necrosis: No  Has patient had a PCN reaction that required hospitalization No  Has patient had a PCN reaction occurring within the last 10 years: No  If all of the above answers are NO, then may proceed with Cephalosporin use.  Other Reaction(s): Dizziness    Social History   Socioeconomic History   Marital status: Single    Spouse name: Not on file   Number of children: Not on file   Years of education: Not on file   Highest education level: 12th grade  Occupational History   Not on file  Tobacco Use   Smoking status: Former   Smokeless tobacco: Never  Vaping Use   Vaping status: Never Used  Substance and Sexual Activity   Alcohol use: No   Drug use: No   Sexual activity: Not on file  Other Topics Concern   Not on file  Social History Narrative   Not on file   Social Drivers of Health   Financial Resource Strain: Low Risk  (03/30/2024)   Overall Financial Resource Strain (CARDIA)    Difficulty of Paying Living Expenses: Not hard at all  Food Insecurity: No Food Insecurity (03/30/2024)   Hunger Vital Sign    Worried About Running Out of Food in the Last Year: Never true    Ran Out of Food in the Last Year: Never true  Transportation Needs: No Transportation Needs (03/30/2024)   PRAPARE - Administrator, Civil Service (Medical): No    Lack of Transportation (Non-Medical): No  Recent Concern: Transportation Needs - Unmet Transportation Needs (03/07/2024)   PRAPARE - Administrator, Civil Service (Medical): Yes    Lack of Transportation (Non-Medical): No  Physical Activity: Insufficiently Active (03/30/2024)   Exercise Vital Sign    Days of Exercise per Week: 2 days    Minutes of Exercise per Session: 30 min  Stress: No Stress Concern Present (03/30/2024)   [employer] - Occupational Stress Questionnaire    Feeling of Stress: Not at all  Social Connections: Moderately Isolated (03/30/2024)   Social Connection and Isolation Panel    Frequency of Communication with Friends and Family: Twice a week    Frequency of Social Gatherings with Friends and Family: Twice a week    Attends Religious Services: 1 to 4 times per year    Active Member of [employer] or Organizations: No    Attends Engineer, Structural: Not on file    Marital Status: Divorced  Intimate Partner Violence: Not At Risk (03/06/2024)   Humiliation, Afraid, Rape, and Kick questionnaire    Fear of Current or Ex-Partner: No    Emotionally Abused: No    Physically Abused: No    Sexually Abused: No    Family History  Problem Relation Age of Onset   Hypertension Mother    Diabetes Mother  COPD Father    Heart disease Father    Diabetes Father     Past Surgical History:  Procedure Laterality Date   BREAST BIOPSY Left 01/08/2024   US  LT BREAST BX W LOC DEV 1ST LESION IMG BX SPEC US  GUIDE 01/08/2024 [facility]   BREAST BIOPSY Left 01/08/2024   US  LT BREAST BX W LOC DEV EA ADD LESION IMG BX SPEC US  GUIDE 01/08/2024 [facility]   BREAST BIOPSY  02/01/2024   MM LT RADIOACTIVE SEED LOC MAMMO GUIDE 02/01/2024 [facility]   BREAST BIOPSY  02/01/2024   MM LT RADIOACTIVE SEED EA ADD LESION LOC MAMMO GUIDE 02/01/2024 [facility]   BREAST LUMPECTOMY WITH RADIOACTIVE SEED AND SENTINEL LYMPH NODE BIOPSY Left 02/05/2024   Procedure: BREAST LUMPECTOMY WITH RADIOACTIVE SEED AND SENTINEL LYMPH NODE BIOPSY;  Surgeon: Ebbie Cough, MD;  Location: [facility] OR;  Service: General;  Laterality: Left;  GEN w/PEC BLOCK LEFT BREAST RADIOACTIVE SEED LOCALIZED LUMPECTOMY x2 LEFT AXILLARY SENTINEL NODE BIOPSY   COLONOSCOPY     x 2 - 2015, 2020   FOOT SURGERY     MICROLARYNGOSCOPY N/A 01/18/2022   Procedure: MICROLARYNGOSCOPY WITH EXCISION OF POLYPS;   Surgeon: Jesus Oliphant, MD;  Location: [facility] OR;  Service: ENT;  Laterality: N/A;   THROAT SURGERY      ROS: Review of Systems Negative except as stated above  PHYSICAL EXAM: BP 117/73 (BP Location: Right Arm, Patient Position: Sitting, Cuff Size: Normal)   Pulse 98   Temp 97.8 F (36.6 C) (Oral)   Ht 5' 11 (1.803 m)   Wt 175 lb (79.4 kg)   SpO2 95%   BMI 24.41 kg/m   Wt Readings from Last 3 Encounters:  04/01/24 175 lb (79.4 kg)  03/04/24 172 lb 3.2 oz (78.1 kg)  02/05/24 179 lb (81.2 kg)    Physical Exam  General appearance - alert, well appearing, older AAF and in no distress Mental status - normal mood, behavior, speech, dress, motor activity, and thought processes Neck - supple, no significant adenopathy Chest - clear to auscultation, no wheezes, rales or rhonchi, symmetric air entry Heart - normal rate, regular rhythm, normal S1, S2, no murmurs, rubs, clicks or gallops Extremities - peripheral pulses normal, no pedal edema, no clubbing or cyanosis      Latest Ref Rng & Units 01/16/2024   12:21 PM 02/08/2023   11:59 AM 12/21/2021   11:05 AM  CMP  Glucose 70 - 99 mg/dL 840  826  868   BUN 8 - 23 mg/dL 9  8  9    Creatinine 0.44 - 1.00 mg/dL 9.11  9.21  9.25   Sodium 135 - 145 mmol/L 141  142  141   Potassium 3.5 - 5.1 mmol/L 3.5  4.1  4.1   Chloride 98 - 111 mmol/L 105  103  103   CO2 22 - 32 mmol/L 31  24  25    Calcium  8.9 - 10.3 mg/dL 8.9  9.3  9.3   Total Protein 6.5 - 8.1 g/dL 6.7  6.4  6.2   Total Bilirubin 0.0 - 1.2 mg/dL 0.4  <9.7  0.3   Alkaline Phos 38 - 126 U/L 59  77  58   AST 15 - 41 U/L 10  15  14    ALT 0 - 44 U/L 9  9  10     Lipid Panel     Component Value Date/Time   CHOL 171 02/08/2023 1159   TRIG 113 02/08/2023  1159   HDL 50 02/08/2023 1159   CHOLHDL 3.4 02/08/2023 1159   CHOLHDL 5.6 (H) 06/09/2015 1023   VLDL 22 06/09/2015 1023   LDLCALC 101 (H) 02/08/2023 1159    CBC    Component Value Date/Time   WBC 6.2 01/16/2024 1221   WBC 4.9  11/18/2021 1013   RBC 4.62 01/16/2024 1221   HGB 12.7 01/16/2024 1221   HGB 13.6 02/08/2023 1159   HCT 36.6 01/16/2024 1221   HCT 40.9 02/08/2023 1159   PLT 221 01/16/2024 1221   PLT 240 02/08/2023 1159   MCV 79.2 (L) 01/16/2024 1221   MCV 82 02/08/2023 1159   MCH 27.5 01/16/2024 1221   MCHC 34.7 01/16/2024 1221   RDW 14.3 01/16/2024 1221   RDW 14.4 02/08/2023 1159   LYMPHSABS 1.3 01/16/2024 1221   LYMPHSABS 1.2 12/21/2021 1105   MONOABS 0.5 01/16/2024 1221   EOSABS 0.1 01/16/2024 1221   EOSABS 0.1 12/21/2021 1105   BASOSABS 0.0 01/16/2024 1221   BASOSABS 0.0 12/21/2021 1105    ASSESSMENT AND PLAN: 1. Type 2 diabetes mellitus with other specified complication, without long-term current use of insulin  (HCC) (Primary) Type 2 diabetes mellitus with recent A1c of 7.3, improved from 7.8 in June. Current medications include glimepiride  and metformin . Jardiance  prescribed but not received, possibly due to insurance issues. Blood sugar monitoring inconsistent. - Continue glimepiride  4 mg twice daily. - Continue metformin  1000 mg twice daily. Hold off on Jardiance  - glimepiride  (AMARYL ) 4 MG tablet; Take 1 tablet (4 mg total) by mouth 2 (two) times daily.  Dispense: 180 tablet; Refill: 1 - metFORMIN  (GLUCOPHAGE ) 1000 MG tablet; TAKE 1 TABLET BY MOUTH 2 (TWO) TIMES DAILY WITH A MEAL. FOR DIABETES  Dispense: 180 tablet; Refill: 1 - Microalbumin / creatinine urine ratio - Ambulatory referral to Ophthalmology  2. Hypertension associated with diabetes (HCC) At goal. Continue current meds - amLODipine  (NORVASC ) 10 MG tablet; Take 1 tablet (10 mg total) by mouth daily.  Dispense: 90 tablet; Refill: 1 - lisinopril  (ZESTRIL ) 30 MG tablet; Take 1 tablet (30 mg total) by mouth daily.  Dispense: 90 tablet; Refill: 1  3. Hyperlipidemia associated with type 2 diabetes mellitus (HCC) Continue Lipitor - atorvastatin  (LIPITOR) 80 MG tablet; Take 1 tablet (80 mg total) by mouth daily. Discontinue the  40 mg Atorvastatin   Dispense: 90 tablet; Refill: 1  4. Multiple polyps of sigmoid colon We agreed to submit the referral but I have put a message in the referral asking them to hold off on scheduling this until after February 2026 to give her time to recover from her breast cancer treatment. - Ambulatory referral to Gastroenterology  5. Malignant neoplasm of upper-inner quadrant of left breast in female, estrogen receptor positive (HCC) Status postlumpectomy and currently receiving radiation.  Plan after that is for 5 to 7 years of anastrozole per oncology note. Advised patient that it is reasonable to request a first-floor apartment since treatment for her cancer has left her feeling fatigue.   Patient was given the opportunity to ask questions.  Patient verbalized understanding of the plan and was able to repeat key elements of the plan.   This documentation was completed using Paediatric nurse.  Any transcriptional errors are unintentional.  Orders Placed This Encounter  Procedures   Microalbumin / creatinine urine ratio   Ambulatory referral to Ophthalmology   Ambulatory referral to Gastroenterology     Requested Prescriptions   Signed Prescriptions Disp Refills   amLODipine  (NORVASC )  10 MG tablet 90 tablet 1    Sig: Take 1 tablet (10 mg total) by mouth daily.   atorvastatin  (LIPITOR) 80 MG tablet 90 tablet 1    Sig: Take 1 tablet (80 mg total) by mouth daily. Discontinue the 40 mg Atorvastatin    glimepiride  (AMARYL ) 4 MG tablet 180 tablet 1    Sig: Take 1 tablet (4 mg total) by mouth 2 (two) times daily.   lisinopril  (ZESTRIL ) 30 MG tablet 90 tablet 1    Sig: Take 1 tablet (30 mg total) by mouth daily.   metFORMIN  (GLUCOPHAGE ) 1000 MG tablet 180 tablet 1    Sig: TAKE 1 TABLET BY MOUTH 2 (TWO) TIMES DAILY WITH A MEAL. FOR DIABETES   albuterol  (VENTOLIN  HFA) 108 (90 Base) MCG/ACT inhaler 1 each 1    Sig: Inhale 2 puffs into the lungs every 4 (four) hours as  needed for wheezing or shortness of breath.    Return in about 4 months (around 07/31/2024) for Medicare Wellness Visit in 4  wks with CMA/LPN .  Barnie Louder, MD, FACP

## 2024-04-02 ENCOUNTER — Encounter: Payer: Self-pay | Admitting: Physical Therapy

## 2024-04-02 ENCOUNTER — Ambulatory Visit: Admitting: Physical Therapy

## 2024-04-02 ENCOUNTER — Telehealth: Payer: Self-pay | Admitting: Internal Medicine

## 2024-04-02 ENCOUNTER — Other Ambulatory Visit: Payer: Self-pay

## 2024-04-02 ENCOUNTER — Ambulatory Visit
Admission: RE | Admit: 2024-04-02 | Discharge: 2024-04-02 | Disposition: A | Source: Ambulatory Visit | Attending: Radiation Oncology

## 2024-04-02 DIAGNOSIS — M25612 Stiffness of left shoulder, not elsewhere classified: Secondary | ICD-10-CM | POA: Diagnosis not present

## 2024-04-02 DIAGNOSIS — C50212 Malignant neoplasm of upper-inner quadrant of left female breast: Secondary | ICD-10-CM

## 2024-04-02 DIAGNOSIS — R600 Localized edema: Secondary | ICD-10-CM

## 2024-04-02 DIAGNOSIS — Z483 Aftercare following surgery for neoplasm: Secondary | ICD-10-CM

## 2024-04-02 DIAGNOSIS — R293 Abnormal posture: Secondary | ICD-10-CM

## 2024-04-02 LAB — RAD ONC ARIA SESSION SUMMARY
Course Elapsed Days: 16
Plan Fractions Treated to Date: 12
Plan Prescribed Dose Per Fraction: 2.67 Gy
Plan Total Fractions Prescribed: 15
Plan Total Prescribed Dose: 40.05 Gy
Reference Point Dosage Given to Date: 32.04 Gy
Reference Point Session Dosage Given: 2.67 Gy
Session Number: 12

## 2024-04-02 NOTE — Telephone Encounter (Signed)
 Copied from CRM #[id number]. Topic: Referral - Status >> Apr 02, 2024  1:08 PM Zebedee SAUNDERS wrote:  Reason for CRM: Received call from [facility] regarding referral # [id number], stated they are OON with pt's insurance. Please redirect referral.

## 2024-04-02 NOTE — Therapy (Signed)
 OUTPATIENT PHYSICAL THERAPY BREAST CANCER TREATMENT   Patient Name: Stephanie Neal MRN: [id number] DOB:16-May-1955, 68 y.o., female Today's Date: 04/02/2024  END OF SESSION:  PT End of Session - 04/02/24 0949     Visit Number 8    Number of Visits 10    Date for Recertification  04/02/24    PT Start Time 0947    PT Stop Time 1051    PT Time Calculation (min) 64 min    Activity Tolerance Patient tolerated treatment well    Behavior During Therapy [facility] for tasks assessed/performed           Past Medical History:  Diagnosis Date   Allergy    Arthritis    Breast cancer (HCC) 01/08/2024   Diabetes mellitus    Dysphonia    polypoid corditis   Hyperlipidemia    Hypertension    Past Surgical History:  Procedure Laterality Date   BREAST BIOPSY Left 01/08/2024   US  LT BREAST BX W LOC DEV 1ST LESION IMG BX SPEC US  GUIDE 01/08/2024 [facility]   BREAST BIOPSY Left 01/08/2024   US  LT BREAST BX W LOC DEV EA ADD LESION IMG BX SPEC US  GUIDE 01/08/2024 [facility]   BREAST BIOPSY  02/01/2024   MM LT RADIOACTIVE SEED LOC MAMMO GUIDE 02/01/2024 [facility]   BREAST BIOPSY  02/01/2024   MM LT RADIOACTIVE SEED EA ADD LESION LOC MAMMO GUIDE 02/01/2024 [facility]   BREAST LUMPECTOMY WITH RADIOACTIVE SEED AND SENTINEL LYMPH NODE BIOPSY Left 02/05/2024   Procedure: BREAST LUMPECTOMY WITH RADIOACTIVE SEED AND SENTINEL LYMPH NODE BIOPSY;  Surgeon: Ebbie Cough, MD;  Location: [facility] OR;  Service: General;  Laterality: Left;  GEN w/PEC BLOCK LEFT BREAST RADIOACTIVE SEED LOCALIZED LUMPECTOMY x2 LEFT AXILLARY SENTINEL NODE BIOPSY   COLONOSCOPY     x 2 - 2015, 2020   FOOT SURGERY     MICROLARYNGOSCOPY N/A 01/18/2022   Procedure: MICROLARYNGOSCOPY WITH EXCISION OF POLYPS;  Surgeon: Jesus Oliphant, MD;  Location: [facility] OR;  Service: ENT;  Laterality: N/A;   THROAT SURGERY     Patient Active Problem List   Diagnosis Date Noted   Genetic testing 01/29/2024   Aortic  atherosclerosis 05/05/2021   Tetanus, diphtheria, and acellular pertussis (Tdap) vaccination declined 05/10/2020   Hyperlipidemia associated with type 2 diabetes mellitus (HCC) 11/18/2019   Chronic pain of right knee 11/18/2019   Mixed hyperlipidemia 06/25/2018   Over weight 06/25/2018   Stress due to family tension 11/13/2017   DM type 2 (diabetes mellitus, type 2) (HCC) 03/05/2014   HYPERCHOLESTEROLEMIA 03/12/2007   OBESITY 03/12/2007   Tobacco dependence 03/12/2007   HTN (hypertension) 03/12/2007   ALLERGIC RHINITIS, SEASONAL 03/12/2007    PCP: Barnie Louder, MD  REFERRING PROVIDER: Dr. Cough Ebbie   REFERRING DIAG: Left breast cancer    THERAPY DIAG:  Stiffness of left shoulder, not elsewhere classified  Aftercare following surgery for neoplasm  Localized edema  Abnormal posture  Malignant neoplasm of upper-inner quadrant of left breast in female, estrogen receptor positive (HCC)  Rationale for Evaluation and Treatment: Rehabilitation  ONSET DATE: 11/16/2023  SUBJECTIVE:  SUBJECTIVE STATEMENT: At night when I use the thing you made me in my bra it takes the ridges out. That bra is what leaves those ridges.   PERTINENT HISTORY:  Patient was diagnosed on 11/16/2023 with left grade 2 invasive lobular carcinoma breast cancer. It measures 1.3 cm and 9 mm and is located in the upper inner quadrant. It is ER/PR positive and HER2 negative with a Ki67 of 10%.   PATIENT GOALS:  Reassess how my recovery is going related to arm function, pain, and swelling.  PAIN:  Are you having pain? No  PRECAUTIONS: Recent Surgery, left UE Lymphedema risk,   RED FLAGS: None   ACTIVITY LEVEL / LEISURE: has been doing post op exercises, has been walking at least 2 miles   OBJECTIVE:   PATIENT  SURVEYS:  QUICK DASH:      OBSERVATIONS: Cording visible in L breast and axilla, seroma/scar tissue palpable in L axilla and in L breast just medial to aerola  POSTURE:  Forward head and rounded shoulders posture   UPPER EXTREMITY AROM/PROM:   A/PROM RIGHT   eval    Shoulder extension 52  Shoulder flexion 151  Shoulder abduction 170  Shoulder internal rotation 70  Shoulder external rotation 82                          (Blank rows = not tested)   A/PROM LEFT   eval LEFT 03/05/24 LEFT 04/02/24  Shoulder extension 48 70   Shoulder flexion 151 165 172  Shoulder abduction 172 165 171  Shoulder internal rotation 75 52   Shoulder external rotation 73 88                           (Blank rows = not tested)   CERVICAL AROM: All within normal limits   UPPER EXTREMITY STRENGTH: WNL   LYMPHEDEMA ASSESSMENTS (in cm):    LANDMARK RIGHT   eval  10 cm proximal to olecranon process 28.3  Olecranon process 25.4  10 cm proximal to ulnar styloid process 20.9  Just proximal to ulnar styloid process 15.9  Across hand at thumb web space 19.8  At base of 2nd digit 6.1  (Blank rows = not tested)   LANDMARK LEFT   eval LEFT 03/05/24  10 cm proximal to olecranon process 28.7 27.2  Olecranon process 25.2 25  10  cm proximal to ulnar styloid process 19.9 19.3  Just proximal to ulnar styloid process 15.9 16.5  Across hand at thumb web space 19.8 20.3  At base of 2nd digit 6.2 6  (Blank rows = not tested)    Surgery type/Date: 02/05/24 L breast lumpectomy and SLNB Number of lymph nodes removed: 1/4 Current/past treatment (chemo, radiation, hormone therapy): radiation and hormone therapy Other symptoms:  Heaviness/tightness Yes Pain No Pitting edema No Infections No Decreased scar mobility Yes Stemmer sign No  TREATMENT PERFORMED: 04/02/24: Therapeutic Exercise: Pulleys x 2 min in direction of flexion and 2 min in direction of abduction with VC's to hold end ROM stretch Ball  up wall x 10 reps in direction of flexion and 10 reps in direction of L shoulder abduction with 1# added to Lt wrist  Therapeutic Activities  Supine over full foam roll for following with 1# to Lt wrist for increased stretch: Bil UE horz abd x 10 reps, bil UE scaption in a V x 10 reps, and then bil UE abduction  in a snow angel x 10, 5 sec holds; these cont to be beneficial for improved shoulder ROM, improved posture and to focus on muscle relaxation during to decrease guarding ; pt able to demonstrate improved ROM with these today  Supine scapular series with red theraband x 10 each - narrow and wide grip flexion, horizontal abduction, ER, diagonals returning therapist demo for each and VC's during for correct UE positioning Manual Therapy: MLD to Lt breast: In supine: Short neck, 5 diaphragmatic breaths, R axillary nodes and establishment of interaxillary pathway, L inguinal nodes and establishment of axilloinguinal pathway, then L breast moving fluid towards pathways spending extra time in any areas of fibrosis then retracing all steps.   03/31/24: Therapeutic Exercise: Pulleys x 1:30 min in direction of flexion and 1:30 min in direction of abduction with VC's to hold end ROM stretch Ball up wall x 10 reps in direction of flexion and 10 reps in direction of L shoulder abduction with 1# added to Lt wrist  Therapeutic Activities  Supine over half foam roll for following with 1# to Lt wrist for increased stretch: Bil UE horz abd x 10 reps, bil UE scaption in a V x 10 reps, and then bil UE abduction in a snow angel x 10, 5 sec holds; these cont to be beneficial for improved shoulder ROM, improved posture and to focus on muscle relaxation during to decrease guarding ; pt able to demonstrate improved ROM with these today  Supine scapular series with yellow theraband x 5-10 each returning therapist demo for each and VC's during for correct UE positioning, HEP issued Manual Therapy: MLD to Lt  breast: Short neck, 5 diaphragmatic breaths, Rt axillary and Lt inguinal nodes, anterior inter-axillary and Lt axillo-inguinal anastomosis, then Lt superior and medial breast redirecting towards anterior anastomosis, then into Rt S/L for focus to lateral and inferior breast redirecting towards lateral anastomosis, then finished retracing all steps in supine. Had pt return demo of each step and instructed her in basics of anatomy of lymphatic system and principles of MLD while performing. She benefited from hand over hand and VC's for correct direction of skin stretches and pressure.   03/25/24: Therapeutic Exercise: Pulleys x 2 min in direction of flexion and 2 min in direction of abduction with pt returning therapist demo, v/c for form Ball up wall x 10 reps in direction of flexion and 10 reps in direction of L shoulder abduction Therapeutic Activities  Supine over half foam roll for following: Bil UE horz abd x 10 reps, bil UE scaption in a V x 10 reps, and then bil UE abduction in a snow angel x 10, 5 sec holds; these cont to be beneficial for improved shoulder ROM, improved posture and to focus on muscle relaxation during to decrease guarding ; pt able to demonstrate improved ROM with these today  Manual Therapy: PROM to L shoulder in direction of flexion, abduction, and D2 with scapular depression by therapist throughout, pt still requires VC's to relax due to muscle guarding but less so MFR to cording in Lt axilla  03/19/24: Therapeutic Exercise: Pulleys x 2 min in direction of flexion and 2 min in direction of abduction with pt returning therapist demo, v/c for form Ball up wall x 10 reps in direction of flexion and 10 reps in direction of L shoulder abduction Therapeutic Activities  Supine over half foam roll for following: Bil UE horz abd x 10 reps, bil UE scaption in a V x 10 reps,  and then bil UE abduction in a snow angel x 10, 5 sec holds' pt returned demo and reports these  stretches felt very good; these beneficial for improved shoulder ROM, improved posture and to focus on muscle relaxation during to decrease guarding  Manual Therapy: PROM to L shoulder in direction of flexion and abduction MFR to cording in L axilla and antecubital fossa In sitting: cording in lateral breast palpable in this position so performed MFR to cording in L breast in seated postion with very thick cord palpable and visible      PATIENT EDUCATION:  Education details: Supine scapular series with yellow theraband and breast self MLD Person educated: Patient Education method: Explanation and Handouts, demonstration and VC's for technique during Education comprehension: verbalized understanding, returned demo and will benefit from further review  HOME EXERCISE PROGRAM: Reviewed previously given post op HEP. Pulleys x 2 min in direction of flexion and abduction 03/31/24 - Supine Scapular Series with yellow theraband and self breast MLD  ASSESSMENT:  CLINICAL IMPRESSION: Assessed pt's progress towards goals in therapy. She has now met all goals for therapy. She was able to progress to red thera band today from yellow and did well with this. Continued educating pt on proper form for supine scapular series. Her breast lymphedema is significantly improved and she is independent in self MLD. She will be discharged from skilled PT services at this time.   Pt will benefit from skilled therapeutic intervention to improve on the following deficits: Decreased knowledge of precautions, impaired UE functional use, pain, decreased ROM, postural dysfunction.   PT treatment/interventions: ADL/Self care home management, [phone]- PT Re-evaluation, 97110-Therapeutic exercises, 97530- Therapeutic activity, [id number]- Neuromuscular re-education, 97535- Self Care, [id number]- Manual therapy, [phone]- Orthotic Initial, [phone]- Orthotic/Prosthetic subsequent, and Patient/Family education   GOALS: Goals reviewed with  patient? Yes  GOALS MET AT EVAL:  GOALS Name Target Date Goal status  1 Pt will be able to verbalize understanding of pertinent lymphedema risk reduction practices relevant to her dx specifically related to skin care.  Baseline:  No knowledge Eval Achieved at eval  2 Pt will be able to return demo and/or verbalize understanding of the post op HEP related to regaining shoulder ROM. Baseline:  No knowledge Eval Achieved at eval  3 Pt will be able to verbalize understanding of the importance of viewing the post op After Breast CA Class video for further lymphedema risk reduction education and therapeutic exercise.  Baseline:  No knowledge Eval Achieved at eval   LONG TERM GOALS:  (STG=LTG)  GOALS Name Target Date  Goal status  1 Pt will demonstrate she has regained full shoulder ROM and function post operatively compared to baselines.  Baseline: 04/02/24 MET 04/02/24  2 Pt will demonstrate 170 degrees of L shoulder abduction to allow her to reach out to the side without tightness. 04/02/24 MET 04/02/24  3 Pt will demonstrate a 50% improvement in axillary and breast cording to decrease tightness and tenderness. 04/02/24 MET   03/31/24 - Pt reports 100% improved with this  4 Pt will be independent in self MLD for management of breast swelling to decrease risk of infection. 04/02/24 MET 04/02/24     PLAN:  PT FREQUENCY/DURATION: d/c this session  PLAN FOR NEXT SESSION: d/c this session  Florina Lanis Carbon, PT 04/02/24 10:56 AM   [facility]  [address]  [place] [address]  [phone]  PHYSICAL THERAPY DISCHARGE SUMMARY  Visits from Start of Care: 8  Current functional level related to goals / functional outcomes: All goals met   Remaining deficits: None   Education / Equipment: HEP, self MLD, compression   Patient agrees to discharge. Patient goals were met. Patient is being discharged due to meeting the stated rehab goals.

## 2024-04-03 ENCOUNTER — Ambulatory Visit: Payer: Self-pay | Admitting: Internal Medicine

## 2024-04-03 ENCOUNTER — Ambulatory Visit
Admission: RE | Admit: 2024-04-03 | Discharge: 2024-04-03 | Disposition: A | Source: Ambulatory Visit | Attending: Radiation Oncology

## 2024-04-03 ENCOUNTER — Other Ambulatory Visit: Payer: Self-pay

## 2024-04-03 DIAGNOSIS — C50212 Malignant neoplasm of upper-inner quadrant of left female breast: Secondary | ICD-10-CM | POA: Diagnosis not present

## 2024-04-03 LAB — RAD ONC ARIA SESSION SUMMARY
Course Elapsed Days: 17
Plan Fractions Treated to Date: 13
Plan Prescribed Dose Per Fraction: 2.67 Gy
Plan Total Fractions Prescribed: 15
Plan Total Prescribed Dose: 40.05 Gy
Reference Point Dosage Given to Date: 34.71 Gy
Reference Point Session Dosage Given: 2.67 Gy
Session Number: 13

## 2024-04-03 LAB — MICROALBUMIN / CREATININE URINE RATIO
Creatinine, Urine: 26.1 mg/dL
Microalb/Creat Ratio: 27 mg/g{creat} (ref 0–29)
Microalbumin, Urine: 7.1 ug/mL

## 2024-04-04 ENCOUNTER — Other Ambulatory Visit: Payer: Self-pay

## 2024-04-04 ENCOUNTER — Ambulatory Visit
Admission: RE | Admit: 2024-04-04 | Discharge: 2024-04-04 | Disposition: A | Source: Ambulatory Visit | Attending: Radiation Oncology

## 2024-04-04 DIAGNOSIS — C50212 Malignant neoplasm of upper-inner quadrant of left female breast: Secondary | ICD-10-CM | POA: Diagnosis not present

## 2024-04-04 LAB — RAD ONC ARIA SESSION SUMMARY
Course Elapsed Days: 18
Plan Fractions Treated to Date: 14
Plan Prescribed Dose Per Fraction: 2.67 Gy
Plan Total Fractions Prescribed: 15
Plan Total Prescribed Dose: 40.05 Gy
Reference Point Dosage Given to Date: 37.38 Gy
Reference Point Session Dosage Given: 2.67 Gy
Session Number: 14

## 2024-04-07 ENCOUNTER — Ambulatory Visit: Admission: RE | Admit: 2024-04-07 | Discharge: 2024-04-07 | Attending: Radiation Oncology

## 2024-04-07 ENCOUNTER — Ambulatory Visit
Admission: RE | Admit: 2024-04-07 | Discharge: 2024-04-07 | Disposition: A | Source: Ambulatory Visit | Attending: Radiation Oncology

## 2024-04-07 ENCOUNTER — Ambulatory Visit: Admitting: Radiation Oncology

## 2024-04-07 ENCOUNTER — Other Ambulatory Visit: Payer: Self-pay

## 2024-04-07 DIAGNOSIS — C50212 Malignant neoplasm of upper-inner quadrant of left female breast: Secondary | ICD-10-CM

## 2024-04-07 LAB — RAD ONC ARIA SESSION SUMMARY
Course Elapsed Days: 21
Plan Fractions Treated to Date: 15
Plan Prescribed Dose Per Fraction: 2.67 Gy
Plan Total Fractions Prescribed: 15
Plan Total Prescribed Dose: 40.05 Gy
Reference Point Dosage Given to Date: 40.05 Gy
Reference Point Session Dosage Given: 2.67 Gy
Session Number: 15

## 2024-04-07 MED ORDER — ALRA NON-METALLIC DEODORANT (RAD-ONC)
1.0000 | Freq: Once | TOPICAL | Status: AC
  Administered 2024-04-07: 1 via TOPICAL

## 2024-04-07 MED ORDER — RADIAPLEXRX EX GEL: Freq: Once | CUTANEOUS | Status: AC

## 2024-04-08 ENCOUNTER — Ambulatory Visit

## 2024-04-08 ENCOUNTER — Other Ambulatory Visit: Payer: Self-pay

## 2024-04-08 DIAGNOSIS — C50212 Malignant neoplasm of upper-inner quadrant of left female breast: Secondary | ICD-10-CM | POA: Diagnosis not present

## 2024-04-08 LAB — RAD ONC ARIA SESSION SUMMARY
Course Elapsed Days: 22
Plan Fractions Treated to Date: 1
Plan Prescribed Dose Per Fraction: 2 Gy
Plan Total Fractions Prescribed: 5
Plan Total Prescribed Dose: 10 Gy
Reference Point Dosage Given to Date: 2 Gy
Reference Point Session Dosage Given: 2 Gy
Session Number: 16

## 2024-04-09 ENCOUNTER — Ambulatory Visit

## 2024-04-09 ENCOUNTER — Other Ambulatory Visit: Payer: Self-pay

## 2024-04-09 DIAGNOSIS — C50212 Malignant neoplasm of upper-inner quadrant of left female breast: Secondary | ICD-10-CM | POA: Diagnosis not present

## 2024-04-09 LAB — RAD ONC ARIA SESSION SUMMARY
Course Elapsed Days: 23
Plan Fractions Treated to Date: 2
Plan Prescribed Dose Per Fraction: 2 Gy
Plan Total Fractions Prescribed: 5
Plan Total Prescribed Dose: 10 Gy
Reference Point Dosage Given to Date: 4 Gy
Reference Point Session Dosage Given: 2 Gy
Session Number: 17

## 2024-04-10 ENCOUNTER — Ambulatory Visit
Admission: RE | Admit: 2024-04-10 | Discharge: 2024-04-10 | Disposition: A | Discharge status: Home / Self Care | Source: Ambulatory Visit | Attending: Radiation Oncology | Admitting: Radiation Oncology

## 2024-04-10 ENCOUNTER — Other Ambulatory Visit: Payer: Self-pay

## 2024-04-10 DIAGNOSIS — C50212 Malignant neoplasm of upper-inner quadrant of left female breast: Secondary | ICD-10-CM | POA: Diagnosis not present

## 2024-04-10 LAB — RAD ONC ARIA SESSION SUMMARY
Course Elapsed Days: 24
Plan Fractions Treated to Date: 3
Plan Prescribed Dose Per Fraction: 2 Gy
Plan Total Fractions Prescribed: 5
Plan Total Prescribed Dose: 10 Gy
Reference Point Dosage Given to Date: 6 Gy
Reference Point Session Dosage Given: 2 Gy
Session Number: 18

## 2024-04-11 ENCOUNTER — Ambulatory Visit
Admission: RE | Admit: 2024-04-11 | Discharge: 2024-04-11 | Disposition: A | Source: Ambulatory Visit | Attending: Radiation Oncology

## 2024-04-11 ENCOUNTER — Other Ambulatory Visit: Payer: Self-pay

## 2024-04-11 DIAGNOSIS — C50212 Malignant neoplasm of upper-inner quadrant of left female breast: Secondary | ICD-10-CM | POA: Diagnosis not present

## 2024-04-11 LAB — RAD ONC ARIA SESSION SUMMARY
Course Elapsed Days: 25
Plan Fractions Treated to Date: 4
Plan Prescribed Dose Per Fraction: 2 Gy
Plan Total Fractions Prescribed: 5
Plan Total Prescribed Dose: 10 Gy
Reference Point Dosage Given to Date: 8 Gy
Reference Point Session Dosage Given: 2 Gy
Session Number: 19

## 2024-04-14 ENCOUNTER — Ambulatory Visit

## 2024-04-14 ENCOUNTER — Ambulatory Visit: Admitting: Radiation Oncology

## 2024-04-14 ENCOUNTER — Inpatient Hospital Stay (HOSPITAL_BASED_OUTPATIENT_CLINIC_OR_DEPARTMENT_OTHER): Admitting: Hematology and Oncology

## 2024-04-14 ENCOUNTER — Other Ambulatory Visit: Payer: Self-pay

## 2024-04-14 ENCOUNTER — Ambulatory Visit
Admission: RE | Admit: 2024-04-14 | Discharge: 2024-04-14 | Disposition: A | Source: Ambulatory Visit | Attending: Radiation Oncology

## 2024-04-14 VITALS — BP 120/78 | HR 102 | Temp 97.9°F | Resp 16 | Ht 71.0 in | Wt 177.8 lb

## 2024-04-14 DIAGNOSIS — Z780 Asymptomatic menopausal state: Secondary | ICD-10-CM

## 2024-04-14 DIAGNOSIS — Z170 Estrogen receptor positive status [ER+]: Secondary | ICD-10-CM

## 2024-04-14 DIAGNOSIS — C50212 Malignant neoplasm of upper-inner quadrant of left female breast: Secondary | ICD-10-CM

## 2024-04-14 DIAGNOSIS — Z79811 Long term (current) use of aromatase inhibitors: Secondary | ICD-10-CM | POA: Insufficient documentation

## 2024-04-14 LAB — RAD ONC ARIA SESSION SUMMARY
Course Elapsed Days: 28
Plan Fractions Treated to Date: 5
Plan Prescribed Dose Per Fraction: 2 Gy
Plan Total Fractions Prescribed: 5
Plan Total Prescribed Dose: 10 Gy
Reference Point Dosage Given to Date: 10 Gy
Reference Point Session Dosage Given: 2 Gy
Session Number: 20

## 2024-04-14 MED ORDER — ANASTROZOLE 1 MG PO TABS: 1.0000 mg | ORAL_TABLET | Freq: Every day | ORAL | 3 refills | Status: AC

## 2024-04-14 NOTE — Assessment & Plan Note (Signed)
 01/08/2024:Screening mammogram detected left breast distortion LIQ, ultrasound detected mass at 9:30 position: 0.9 cm, second mass at the 11 o'clock position 1.3 cm, axilla negative, biopsy: Grade 2 ILC, no LVI, ER 100%, PR 25%, Ki67 10%, HER2 1+ negative    02/05/2024:Left lumpectomy: Grade 2 ILC 3.5 cm with LCIS, margins negative, 1/4 lymph node positive for micrometastases, ER 100%, PR 25%, HER2 negative, Ki-67 10% Oncotype DX recurrence score: 18 (4% risk of distant recurrence) 03/18/2024-04/14/2024: Adjuvant radiation  Treatment plan: Antiestrogen therapy with anastrozole  1 mg daily x 7 years Return to clinic in 3 months for survivorship care plan visit

## 2024-04-14 NOTE — Progress Notes (Signed)
 Patient Care Team: [name], MD as PCP - General (Internal Medicine) Gerome, Devere HERO, RN as Oncology Nurse Navigator Tyree Nanetta SAILOR, RN as Oncology Nurse Navigator Ebbie Cough, MD as Consulting Physician (General Surgery) [name], MD as Consulting Physician (Hematology and Oncology) Izell Domino, MD as Attending Physician (Radiation Oncology)  DIAGNOSIS:  Encounter Diagnosis  Name Primary?   Malignant neoplasm of upper-inner quadrant of left breast in female, estrogen receptor positive (HCC) Yes    SUMMARY OF ONCOLOGIC HISTORY: Oncology History  Malignant neoplasm of upper-inner quadrant of left breast in female, estrogen receptor positive (HCC)  01/08/2024 Initial Diagnosis   Screening mammogram detected left breast distortion LIQ, ultrasound detected mass at 9:30 position: 0.9 cm, second mass at the 11 o'clock position 1.3 cm, axilla negative, biopsy: Grade 2 ILC, no LVI, ER 100%, PR 25%, Ki67 10%, HER2 1+ negative   01/16/2024 Cancer Staging   Staging form: Breast, AJCC 8th Edition - Clinical: Stage IA (cT1c, cN0, cM0, G2, ER+, PR+, HER2-) - Signed by [name], MD on 01/16/2024 Stage prefix: Initial diagnosis Histologic grading system: 3 grade system   01/29/2024 Genetic Testing   Patient has genetic testing done due to her personal history of breast cancer. Genetic Testing [facility] CancerNext-Expanded+RNAinsight (77 genes) Report Date: 01/29/2024 Result: ALK,p.G107V (c.320G>T) - Variant of Uncertain Significance (VUS)  [facility] CancerNext-Expanded + RNAinsight gene panel which includes sequencing, rearrangement, and RNA analysis for the following 77 genes: AIP, ALK, APC, ATM, AXIN2, BAP1, BARD1, BMPR1A, BRCA1, BRCA2, BRIP1, CDC73, CDH1, CDK4, CDKN1B, CDKN2A, CEBPA, CHEK2, CTNNA1, DDX41, DICER1, ETV6, FH, FLCN, GATA2, LZTR1, MAX, MBD4, MEN1, MET, MLH1, MSH2, MSH3, MSH6, MUTYH, NF1, NF2, NTHL1, PALB2, PHOX2B, PMS2, POT1, PRKAR1A, PTCH1, PTEN, RAD51C, RAD51D,  RB1, RET, RPS20, RUNX1, SDHA, SDHAF2, SDHB, SDHC, SDHD, SMAD4, SMARCA4, SMARCB1, SMARCE1, STK11, SUFU, TMEM127, TP53, TSC1, TSC2, VHL, and WT1 (sequencing and deletion/duplication); EGFR, HOXB13, KIT, MITF, PDGFRA, POLD1, and POLE (sequencing only); EPCAM and GREM1 (deletion/duplication only).      02/05/2024 Surgery   Left lumpectomy: Grade 2 ILC 3.5 cm with LCIS, margins negative, 1/4 lymph node positive for micrometastases, ER 100%, PR 25%, HER2 negative, Ki-67 10%   03/07/2024 Cancer Staging   Staging form: Breast, AJCC 8th Edition - Pathologic stage from 03/07/2024: Stage IB (pT2, pN26mi, cM0, G2, ER+, PR+, HER2-) - Signed by Izell Domino, MD on 03/07/2024 Stage prefix: Initial diagnosis Histologic grading system: 3 grade system     CHIEF COMPLIANT: Follow-up to discuss antiestrogen therapy  HISTORY OF PRESENT ILLNESS:  History of Present Illness Stephanie Neal is a 68 year old woman with stage IA, ER+/PR+, HER2- invasive lobular carcinoma of the left breast, status post lumpectomy and adjuvant radiation, presenting for post-radiation oncology follow-up and initiation of adjuvant anastrozole  therapy.  She completed left breast lumpectomy with sentinel lymph node biopsy showing 1 of 4 nodes positive for micrometastases and adjuvant whole breast radiation for a 3.5 cm, grade 2 invasive lobular carcinoma with associated lobular carcinoma in situ. She denies breast pain, musculoskeletal symptoms, and other treatment-related adverse effects.  She is starting adjuvant endocrine therapy with anastrozole  1 mg daily, planned for 5 to 7 years. She denies baseline hot flashes or arthralgias. She is not taking calcium  or vitamin D and has never had bone mineral density testing, which may affect monitoring while on an aromatase inhibitor.  She reports significant emotional distress and grief after the recent death of her oldest brother, who was a major source of support, and is adjusting  to  changes in her social support, which may affect her ability to cope with ongoing cancer therapy.      ALLERGIES:  is allergic to penicillins.  MEDICATIONS:  Current Outpatient Medications  Medication Sig Dispense Refill   Accu-Chek Softclix Lancets lancets Use as instructed to check blood sugar once a day. E11.9 100 each 6   albuterol  (VENTOLIN  HFA) 108 (90 Base) MCG/ACT inhaler Inhale 2 puffs into the lungs every 4 (four) hours as needed for wheezing or shortness of breath. 1 each 1   amLODipine  (NORVASC ) 10 MG tablet Take 1 tablet (10 mg total) by mouth daily. 90 tablet 1   atorvastatin  (LIPITOR) 80 MG tablet Take 1 tablet (80 mg total) by mouth daily. Discontinue the 40 mg Atorvastatin  90 tablet 1   Blood Glucose Monitoring Suppl (ACCU-CHEK GUIDE ME) w/Device KIT Use as directed 1 kit 0   Blood Pressure Monitoring (BLOOD PRESSURE CUFF) MISC Use to check blood pressure once daily. 1 each 0   fluticasone  (FLONASE ) 50 MCG/ACT nasal spray Place 1 spray into both nostrils daily. 16 g 6   glimepiride  (AMARYL ) 4 MG tablet Take 1 tablet (4 mg total) by mouth 2 (two) times daily. 180 tablet 1   glucose blood (ACCU-CHEK GUIDE TEST) test strip Use as instructed to check blood sugar once a day. E11.9 100 each 6   lisinopril  (ZESTRIL ) 30 MG tablet Take 1 tablet (30 mg total) by mouth daily. 90 tablet 1   metFORMIN  (GLUCOPHAGE ) 1000 MG tablet TAKE 1 TABLET BY MOUTH 2 (TWO) TIMES DAILY WITH A MEAL. FOR DIABETES 180 tablet 1   No current facility-administered medications for this visit.    PHYSICAL EXAMINATION: ECOG PERFORMANCE STATUS: 1 - Symptomatic but completely ambulatory  Vitals:   04/14/24 0831  BP: 120/78  Pulse: (!) 102  Resp: 16  Temp: 97.9 F (36.6 C)  SpO2: 100%   Filed Weights   04/14/24 0831  Weight: 177 lb 12.8 oz (80.6 kg)     LABORATORY DATA:  I have reviewed the data as listed    Latest Ref Rng & Units 01/16/2024   12:21 PM 02/08/2023   11:59 AM 12/21/2021   11:05  AM  CMP  Glucose 70 - 99 mg/dL 840  826  868   BUN 8 - 23 mg/dL 9  8  9    Creatinine 0.44 - 1.00 mg/dL 9.11  9.21  9.25   Sodium 135 - 145 mmol/L 141  142  141   Potassium 3.5 - 5.1 mmol/L 3.5  4.1  4.1   Chloride 98 - 111 mmol/L 105  103  103   CO2 22 - 32 mmol/L 31  24  25    Calcium  8.9 - 10.3 mg/dL 8.9  9.3  9.3   Total Protein 6.5 - 8.1 g/dL 6.7  6.4  6.2   Total Bilirubin 0.0 - 1.2 mg/dL 0.4  <9.7  0.3   Alkaline Phos 38 - 126 U/L 59  77  58   AST 15 - 41 U/L 10  15  14    ALT 0 - 44 U/L 9  9  10      Lab Results  Component Value Date   WBC 6.2 01/16/2024   HGB 12.7 01/16/2024   HCT 36.6 01/16/2024   MCV 79.2 (L) 01/16/2024   PLT 221 01/16/2024   NEUTROABS 4.3 01/16/2024    ASSESSMENT & PLAN:  Malignant neoplasm of upper-inner quadrant of left breast in female, estrogen receptor positive (HCC)  01/08/2024:Screening mammogram detected left breast distortion LIQ, ultrasound detected mass at 9:30 position: 0.9 cm, second mass at the 11 o'clock position 1.3 cm, axilla negative, biopsy: Grade 2 ILC, no LVI, ER 100%, PR 25%, Ki67 10%, HER2 1+ negative    02/05/2024:Left lumpectomy: Grade 2 ILC 3.5 cm with LCIS, margins negative, 1/4 lymph node positive for micrometastases, ER 100%, PR 25%, HER2 negative, Ki-67 10% Oncotype DX recurrence score: 18 (4% risk of distant recurrence) 03/18/2024-04/14/2024: Adjuvant radiation  Treatment plan: Antiestrogen therapy with anastrozole  1 mg daily x 7 years I ordered a bone density test  Anastrozole  counseling: We discussed the risks and benefits of anti-estrogen therapy with aromatase inhibitors. These include but not limited to insomnia, hot flashes, mood changes, vaginal dryness, bone density loss, and weight gain. We strongly believe that the benefits far outweigh the risks. Patient understands these risks and consented to starting treatment. Planned treatment duration is 5-7 years.  Return to clinic in 3 months for survivorship care plan  visit    No orders of the defined types were placed in this encounter.  The patient has a good understanding of the overall plan. she agrees with it. she will call with any problems that may develop before the next visit here.  I personally spent a total of 30 minutes in the care of the patient today including preparing to see the patient, getting/reviewing separately obtained history, performing a medically appropriate exam/evaluation, counseling and educating, placing orders, referring and communicating with other health care professionals, documenting clinical information in the EHR, independently interpreting results, communicating results, and coordinating care.   Viinay K Sofya Moustafa, MD 04/14/2024

## 2024-04-15 ENCOUNTER — Ambulatory Visit

## 2024-04-15 NOTE — Radiation Completion Notes (Signed)
 Patient Name: Stephanie Neal, Stephanie Neal MRN: [id number] Date of Birth: 13-Jan-1956 Referring Physician: BARNIE LOUDER, M.D. Date of Service: 2024-04-15 Radiation Oncologist: Lauraine Golden, M.D. [facility] [place]                             RADIATION ONCOLOGY END OF TREATMENT NOTE     Diagnosis: C50.212 Malignant neoplasm of upper-inner quadrant of left female breast Staging on 2024-03-07: Malignant neoplasm of upper-inner quadrant of left breast in female, estrogen receptor positive (HCC) T=pT2, N=pN75mi, M=cM0 Staging on 2024-01-16: Malignant neoplasm of upper-inner quadrant of left breast in female, estrogen receptor positive (HCC) T=cT1c, N=cN0, M=cM0 Intent: Curative     ==========DELIVERED PLANS==========  First Treatment Date: 2024-03-17 Last Treatment Date: 2024-04-14   Plan Name: Brst_L_Ax_BH Site: Breast, Left Technique: 3D Mode: Photon Dose Per Fraction: 2.67 Gy Prescribed Dose (Delivered / Prescribed): 40.05 Gy / 40.05 Gy Prescribed Fxs (Delivered / Prescribed): 15 / 15   Plan Name: Brst_L_Bst_BH Site: Breast, Left Technique: 3D Mode: Photon Dose Per Fraction: 2 Gy Prescribed Dose (Delivered / Prescribed): 10 Gy / 10 Gy Prescribed Fxs (Delivered / Prescribed): 5 / 5     ==========ON TREATMENT VISIT DATES========== 2024-03-18, 2024-03-24, 2024-03-31, 2024-04-07, 2024-04-14     ==========UPCOMING VISITS========== 07/10/2024 [facility] C[facility] PLAN VISIT Crawford Morna Pickle, NP  06/02/2024 [facility] REH AT [facility] SCREEN Aden Berwyn LABOR, PTA  05/15/2024 [facility]RAD[facility] ONC FOLLOW UP 20 Golden Lauraine, MD        ==========APPENDIX - ON TREATMENT VISIT NOTES==========   See weekly On Treatment Notes in Epic for details in the Media tab (listed as Progress notes on the On Treatment Visit Dates listed above).

## 2024-04-16 ENCOUNTER — Ambulatory Visit

## 2024-04-17 ENCOUNTER — Ambulatory Visit

## 2024-04-18 ENCOUNTER — Ambulatory Visit

## 2024-04-21 ENCOUNTER — Ambulatory Visit

## 2024-04-22 ENCOUNTER — Ambulatory Visit

## 2024-04-30 ENCOUNTER — Other Ambulatory Visit: Payer: Self-pay

## 2024-04-30 NOTE — Progress Notes (Signed)
 Stephanie Neal is here today for follow up post radiation to the breast.   Breast Side: Left    They completed their radiation on: 04/14/2024   Does the patient complain of any of the following: Post radiation skin issues: None Breast Tenderness: None Breast Swelling: None Lymphadema: None Range of Motion limitations: None Fatigue post radiation: None Appetite good/fair/poor: Good  Additional comments if applicable:  None Encouraged patient to use vitamin E oil. if she begins to experience any side effects related to the radiation to not hesitate to call our office.

## 2024-05-15 ENCOUNTER — Ambulatory Visit
Admission: RE | Admit: 2024-05-15 | Discharge: 2024-05-15 | Disposition: A | Discharge status: Home / Self Care | Source: Ambulatory Visit | Attending: Radiation Oncology | Admitting: Radiation Oncology

## 2024-05-15 DIAGNOSIS — C50212 Malignant neoplasm of upper-inner quadrant of left female breast: Secondary | ICD-10-CM

## 2024-05-15 NOTE — Progress Notes (Signed)
 Stephanie Neal arrived to the clinic in person although her appointment was a telephone call. I called her a day early to complete the nursing phone assessment on Wednesday. I explained to her it is a nurse follow up call and assessed if she had any negative symptoms. She denied any negative symptoms and encouraged her to call our office with any questions or concerns.

## 2024-05-15 NOTE — Addendum Note (Signed)
 Encounter addended by: Mohamed-Medani, Elverna LABOR, RN on: 05/15/2024 9:55 AM  Actions taken: Clinical Note Signed

## 2024-05-31 ENCOUNTER — Other Ambulatory Visit: Payer: Self-pay | Admitting: Internal Medicine

## 2024-05-31 DIAGNOSIS — J301 Allergic rhinitis due to pollen: Secondary | ICD-10-CM

## 2024-06-02 ENCOUNTER — Ambulatory Visit

## 2024-06-18 ENCOUNTER — Ambulatory Visit: Attending: General Surgery | Admitting: Rehabilitation

## 2024-07-10 ENCOUNTER — Inpatient Hospital Stay: Attending: Hematology and Oncology | Admitting: Adult Health

## 2024-07-10 ENCOUNTER — Inpatient Hospital Stay

## 2024-07-29 ENCOUNTER — Ambulatory Visit: Payer: Self-pay

## 2024-07-31 ENCOUNTER — Ambulatory Visit: Admitting: Internal Medicine
